# Patient Record
Sex: Female | Born: 1935 | Race: White | Hispanic: No | Smoking: Never smoker
Health system: Southern US, Community
[De-identification: ages and names within clinical notes are randomized; demographics above are authoritative.]

## PROBLEM LIST (undated history)

## (undated) DIAGNOSIS — G894 Chronic pain syndrome: Secondary | ICD-10-CM

## (undated) DIAGNOSIS — D649 Anemia, unspecified: Secondary | ICD-10-CM

## (undated) DIAGNOSIS — E079 Disorder of thyroid, unspecified: Secondary | ICD-10-CM

## (undated) DIAGNOSIS — Z8719 Personal history of other diseases of the digestive system: Secondary | ICD-10-CM

## (undated) DIAGNOSIS — H3530 Unspecified macular degeneration: Secondary | ICD-10-CM

## (undated) DIAGNOSIS — M1990 Unspecified osteoarthritis, unspecified site: Secondary | ICD-10-CM

## (undated) DIAGNOSIS — K219 Gastro-esophageal reflux disease without esophagitis: Secondary | ICD-10-CM

## (undated) DIAGNOSIS — J449 Chronic obstructive pulmonary disease, unspecified: Secondary | ICD-10-CM

## (undated) DIAGNOSIS — R634 Abnormal weight loss: Secondary | ICD-10-CM

## (undated) DIAGNOSIS — I10 Essential (primary) hypertension: Secondary | ICD-10-CM

## (undated) HISTORY — DX: Chronic obstructive pulmonary disease, unspecified: J44.9

## (undated) HISTORY — PX: JOINT REPLACEMENT: SHX530

## (undated) HISTORY — PX: CATARACT EXTRACTION: SUR2

## (undated) HISTORY — DX: Abnormal weight loss: R63.4

## (undated) HISTORY — DX: Chronic pain syndrome: G89.4

## (undated) HISTORY — DX: Unspecified osteoarthritis, unspecified site: M19.90

## (undated) HISTORY — DX: Essential (primary) hypertension: I10

## (undated) HISTORY — DX: Unspecified macular degeneration: H35.30

## (undated) HISTORY — PX: APPENDECTOMY: SHX54

## (undated) HISTORY — DX: Disorder of thyroid, unspecified: E07.9

## (undated) HISTORY — PX: TUBAL LIGATION: SHX77

## (undated) HISTORY — DX: Gastro-esophageal reflux disease without esophagitis: K21.9

## (undated) HISTORY — PX: ORIF HIP FRACTURE: SHX2125

## (undated) HISTORY — DX: Personal history of other diseases of the digestive system: Z87.19

---

## 2001-02-21 ENCOUNTER — Other Ambulatory Visit: Admission: RE | Admit: 2001-02-21 | Discharge: 2001-02-21 | Payer: Self-pay | Admitting: Obstetrics and Gynecology

## 2001-03-27 ENCOUNTER — Encounter (INDEPENDENT_AMBULATORY_CARE_PROVIDER_SITE_OTHER): Payer: Self-pay

## 2001-03-27 ENCOUNTER — Other Ambulatory Visit: Admission: RE | Admit: 2001-03-27 | Discharge: 2001-03-27 | Payer: Self-pay | Admitting: Obstetrics and Gynecology

## 2002-01-30 ENCOUNTER — Encounter: Payer: Self-pay | Admitting: Obstetrics and Gynecology

## 2002-02-05 ENCOUNTER — Inpatient Hospital Stay (HOSPITAL_COMMUNITY): Admission: RE | Admit: 2002-02-05 | Discharge: 2002-02-07 | Payer: Self-pay | Admitting: Obstetrics and Gynecology

## 2002-02-05 ENCOUNTER — Encounter (INDEPENDENT_AMBULATORY_CARE_PROVIDER_SITE_OTHER): Payer: Self-pay | Admitting: Specialist

## 2002-11-09 ENCOUNTER — Encounter: Payer: Self-pay | Admitting: Emergency Medicine

## 2002-11-09 ENCOUNTER — Emergency Department (HOSPITAL_COMMUNITY): Admission: EM | Admit: 2002-11-09 | Discharge: 2002-11-09 | Payer: Self-pay | Admitting: Emergency Medicine

## 2002-12-15 ENCOUNTER — Inpatient Hospital Stay (HOSPITAL_COMMUNITY): Admission: RE | Admit: 2002-12-15 | Discharge: 2002-12-20 | Payer: Self-pay | Admitting: Orthopedic Surgery

## 2004-08-31 ENCOUNTER — Ambulatory Visit: Payer: Self-pay | Admitting: Internal Medicine

## 2004-11-04 ENCOUNTER — Ambulatory Visit: Payer: Self-pay | Admitting: Internal Medicine

## 2005-07-04 ENCOUNTER — Inpatient Hospital Stay (HOSPITAL_COMMUNITY): Admission: AD | Admit: 2005-07-04 | Discharge: 2005-07-08 | Payer: Self-pay | Admitting: Internal Medicine

## 2005-07-04 ENCOUNTER — Ambulatory Visit: Payer: Self-pay | Admitting: Internal Medicine

## 2005-07-06 ENCOUNTER — Ambulatory Visit: Payer: Self-pay | Admitting: Internal Medicine

## 2005-07-25 ENCOUNTER — Ambulatory Visit: Payer: Self-pay | Admitting: Internal Medicine

## 2005-07-27 ENCOUNTER — Ambulatory Visit: Payer: Self-pay | Admitting: Internal Medicine

## 2005-08-02 ENCOUNTER — Ambulatory Visit (HOSPITAL_COMMUNITY): Admission: RE | Admit: 2005-08-02 | Discharge: 2005-08-02 | Payer: Self-pay | Admitting: Internal Medicine

## 2005-11-30 ENCOUNTER — Ambulatory Visit: Payer: Self-pay | Admitting: Internal Medicine

## 2006-08-10 ENCOUNTER — Ambulatory Visit (HOSPITAL_COMMUNITY): Admission: RE | Admit: 2006-08-10 | Discharge: 2006-08-10 | Payer: Self-pay | Admitting: Internal Medicine

## 2006-08-10 ENCOUNTER — Encounter (INDEPENDENT_AMBULATORY_CARE_PROVIDER_SITE_OTHER): Payer: Self-pay | Admitting: Specialist

## 2006-08-10 ENCOUNTER — Ambulatory Visit: Payer: Self-pay | Admitting: Internal Medicine

## 2006-08-10 DIAGNOSIS — K315 Obstruction of duodenum: Secondary | ICD-10-CM | POA: Insufficient documentation

## 2006-08-15 ENCOUNTER — Ambulatory Visit: Payer: Self-pay | Admitting: Internal Medicine

## 2006-09-12 ENCOUNTER — Ambulatory Visit: Payer: Self-pay | Admitting: Internal Medicine

## 2006-09-12 LAB — CONVERTED CEMR LAB
ALT: 16 units/L (ref 0–40)
BUN: 15 mg/dL (ref 6–23)
Bilirubin, Direct: 0.1 mg/dL (ref 0.0–0.3)
CO2: 29 meq/L (ref 19–32)
Calcium: 9.7 mg/dL (ref 8.4–10.5)
Chol/HDL Ratio, serum: 3.5
Cholesterol: 179 mg/dL (ref 0–200)
GFR calc non Af Amer: 57 mL/min
Glomerular Filtration Rate, Af Am: 68 mL/min/{1.73_m2}
Glucose, Bld: 126 mg/dL — ABNORMAL HIGH (ref 70–99)
HDL: 50.6 mg/dL (ref >39.0)
Hemoglobin: 11.8 g/dL — ABNORMAL LOW (ref 12.0–15.0)
LDL Cholesterol: 109 mg/dL — ABNORMAL HIGH (ref 0–99)
MCHC: 31.8 g/dL (ref 30.0–36.0)
Platelets: 316 10*3/uL (ref 150–400)
Potassium: 3.6 meq/L (ref 3.5–5.1)
RBC: 4.15 M/uL (ref 3.87–5.11)
Total Protein: 6.7 g/dL (ref 6.0–8.3)

## 2007-06-06 ENCOUNTER — Telehealth: Payer: Self-pay | Admitting: Internal Medicine

## 2007-06-19 ENCOUNTER — Ambulatory Visit: Payer: Self-pay | Admitting: Gastroenterology

## 2007-06-20 ENCOUNTER — Ambulatory Visit (HOSPITAL_COMMUNITY): Admission: RE | Admit: 2007-06-20 | Discharge: 2007-06-20 | Payer: Self-pay | Admitting: Internal Medicine

## 2007-06-20 ENCOUNTER — Encounter: Payer: Self-pay | Admitting: Internal Medicine

## 2007-06-24 ENCOUNTER — Ambulatory Visit: Payer: Self-pay | Admitting: Internal Medicine

## 2007-06-26 ENCOUNTER — Ambulatory Visit (HOSPITAL_COMMUNITY): Admission: RE | Admit: 2007-06-26 | Discharge: 2007-06-26 | Payer: Self-pay | Admitting: Internal Medicine

## 2007-07-18 ENCOUNTER — Ambulatory Visit (HOSPITAL_COMMUNITY): Admission: RE | Admit: 2007-07-18 | Discharge: 2007-07-18 | Payer: Self-pay | Admitting: Internal Medicine

## 2007-07-18 ENCOUNTER — Encounter: Payer: Self-pay | Admitting: Internal Medicine

## 2007-07-18 DIAGNOSIS — Q400 Congenital hypertrophic pyloric stenosis: Secondary | ICD-10-CM | POA: Insufficient documentation

## 2007-07-23 ENCOUNTER — Encounter: Payer: Self-pay | Admitting: Internal Medicine

## 2007-08-12 HISTORY — PX: OTHER SURGICAL HISTORY: SHX169

## 2007-08-13 ENCOUNTER — Encounter: Payer: Self-pay | Admitting: Internal Medicine

## 2007-08-16 ENCOUNTER — Encounter: Payer: Self-pay | Admitting: Internal Medicine

## 2007-08-16 ENCOUNTER — Inpatient Hospital Stay (HOSPITAL_COMMUNITY): Admission: RE | Admit: 2007-08-16 | Discharge: 2007-08-20 | Payer: Self-pay | Admitting: General Surgery

## 2007-08-20 ENCOUNTER — Encounter: Payer: Self-pay | Admitting: Internal Medicine

## 2007-09-17 ENCOUNTER — Telehealth: Payer: Self-pay | Admitting: Internal Medicine

## 2007-09-20 ENCOUNTER — Telehealth: Payer: Self-pay | Admitting: Internal Medicine

## 2007-09-24 ENCOUNTER — Ambulatory Visit (HOSPITAL_COMMUNITY): Admission: RE | Admit: 2007-09-24 | Discharge: 2007-09-24 | Payer: Self-pay | Admitting: Orthopedic Surgery

## 2007-10-23 ENCOUNTER — Telehealth: Payer: Self-pay | Admitting: Internal Medicine

## 2007-11-04 ENCOUNTER — Ambulatory Visit: Payer: Self-pay | Admitting: Internal Medicine

## 2007-11-04 DIAGNOSIS — M1990 Unspecified osteoarthritis, unspecified site: Secondary | ICD-10-CM | POA: Insufficient documentation

## 2007-11-04 DIAGNOSIS — E039 Hypothyroidism, unspecified: Secondary | ICD-10-CM

## 2007-11-04 DIAGNOSIS — R634 Abnormal weight loss: Secondary | ICD-10-CM

## 2007-11-04 DIAGNOSIS — K219 Gastro-esophageal reflux disease without esophagitis: Secondary | ICD-10-CM

## 2007-11-04 DIAGNOSIS — I10 Essential (primary) hypertension: Secondary | ICD-10-CM | POA: Insufficient documentation

## 2007-11-04 LAB — CONVERTED CEMR LAB
AST: 24 units/L (ref 0–37)
Albumin: 3.9 g/dL (ref 3.5–5.2)
Alkaline Phosphatase: 59 units/L (ref 39–117)
BUN: 25 mg/dL — ABNORMAL HIGH (ref 6–23)
Basophils Absolute: 0 10*3/uL (ref 0.0–0.1)
Calcium: 9.9 mg/dL (ref 8.4–10.5)
Chloride: 101 meq/L (ref 96–112)
Creatinine, Ser: 0.9 mg/dL (ref 0.4–1.2)
Direct LDL: 116.9 mg/dL
Eosinophils Absolute: 0.1 10*3/uL (ref 0.0–0.6)
GFR calc non Af Amer: 64 mL/min
HCT: 37.3 % (ref 36.0–46.0)
HDL: 49.6 mg/dL (ref >39.0)
MCHC: 33.1 g/dL (ref 30.0–36.0)
MCV: 93.8 fL (ref 78.0–100.0)
Monocytes Relative: 5.5 % (ref 3.0–11.0)
Platelets: 364 10*3/uL (ref 150–400)
RBC: 3.97 M/uL (ref 3.87–5.11)
RDW: 13.9 % (ref 11.5–14.6)
Sodium: 139 meq/L (ref 135–145)
Total Bilirubin: 0.8 mg/dL (ref 0.3–1.2)
Total CHOL/HDL Ratio: 4.2
Triglycerides: 167 mg/dL — ABNORMAL HIGH (ref 0–149)

## 2007-11-07 ENCOUNTER — Telehealth (INDEPENDENT_AMBULATORY_CARE_PROVIDER_SITE_OTHER): Payer: Self-pay | Admitting: Family Medicine

## 2007-11-08 ENCOUNTER — Telehealth: Payer: Self-pay | Admitting: Internal Medicine

## 2007-11-19 DIAGNOSIS — J449 Chronic obstructive pulmonary disease, unspecified: Secondary | ICD-10-CM | POA: Insufficient documentation

## 2007-11-19 DIAGNOSIS — N390 Urinary tract infection, site not specified: Secondary | ICD-10-CM

## 2007-11-19 DIAGNOSIS — M7730 Calcaneal spur, unspecified foot: Secondary | ICD-10-CM | POA: Insufficient documentation

## 2007-12-03 ENCOUNTER — Ambulatory Visit: Payer: Self-pay | Admitting: Internal Medicine

## 2008-01-27 ENCOUNTER — Telehealth: Payer: Self-pay | Admitting: Internal Medicine

## 2008-08-04 ENCOUNTER — Ambulatory Visit: Payer: Self-pay | Admitting: Internal Medicine

## 2008-11-26 ENCOUNTER — Encounter: Payer: Self-pay | Admitting: Internal Medicine

## 2008-11-27 ENCOUNTER — Ambulatory Visit: Payer: Self-pay | Admitting: Internal Medicine

## 2008-11-27 LAB — CONVERTED CEMR LAB
Alkaline Phosphatase: 83 units/L (ref 39–117)
Basophils Absolute: 0 10*3/uL (ref 0.0–0.1)
Bilirubin, Direct: 0.1 mg/dL (ref 0.0–0.3)
CO2: 30 meq/L (ref 19–32)
Calcium: 9.2 mg/dL (ref 8.4–10.5)
Creatinine, Ser: 0.7 mg/dL (ref 0.4–1.2)
Eosinophils Absolute: 0.2 10*3/uL (ref 0.0–0.7)
GFR calc non Af Amer: 84.86 mL/min (ref >60)
Glucose, Bld: 91 mg/dL (ref 70–99)
HDL: 52.2 mg/dL (ref >39.00)
Lymphocytes Relative: 25.1 % (ref 12.0–46.0)
MCHC: 33.5 g/dL (ref 30.0–36.0)
Monocytes Relative: 6.4 % (ref 3.0–12.0)
Neutrophils Relative %: 63.6 % (ref 43.0–77.0)
RBC: 4.14 M/uL (ref 3.87–5.11)
RDW: 12.7 % (ref 11.5–14.6)
Total CHOL/HDL Ratio: 3
Triglycerides: 85 mg/dL (ref 0.0–149.0)
VLDL: 17 mg/dL (ref 0.0–40.0)

## 2009-06-08 ENCOUNTER — Ambulatory Visit: Payer: Self-pay | Admitting: Internal Medicine

## 2009-09-06 ENCOUNTER — Telehealth: Payer: Self-pay | Admitting: Internal Medicine

## 2009-12-03 ENCOUNTER — Ambulatory Visit: Payer: Self-pay | Admitting: Internal Medicine

## 2010-07-21 ENCOUNTER — Ambulatory Visit: Payer: Self-pay | Admitting: Internal Medicine

## 2010-08-22 ENCOUNTER — Telehealth: Payer: Self-pay | Admitting: Internal Medicine

## 2010-10-02 ENCOUNTER — Encounter: Payer: Self-pay | Admitting: Orthopedic Surgery

## 2010-10-09 LAB — CONVERTED CEMR LAB
Alkaline Phosphatase: 68 units/L (ref 39–117)
Basophils Relative: 1 % (ref 0.0–3.0)
Bilirubin, Direct: 0 mg/dL (ref 0.0–0.3)
Calcium: 9.3 mg/dL (ref 8.4–10.5)
Creatinine, Ser: 0.9 mg/dL (ref 0.4–1.2)
Eosinophils Absolute: 0.3 10*3/uL (ref 0.0–0.7)
Eosinophils Relative: 5.9 % — ABNORMAL HIGH (ref 0.0–5.0)
GFR calc non Af Amer: 63.34 mL/min (ref >60)
HDL: 60.9 mg/dL (ref >39.00)
LDL Cholesterol: 80 mg/dL (ref 0–99)
Lymphocytes Relative: 29.4 % (ref 12.0–46.0)
MCHC: 33 g/dL (ref 30.0–36.0)
Neutrophils Relative %: 58 % (ref 43.0–77.0)
RBC: 4.08 M/uL (ref 3.87–5.11)
Total CHOL/HDL Ratio: 3
Total Protein: 6.5 g/dL (ref 6.0–8.3)
Triglycerides: 84 mg/dL (ref 0.0–149.0)
VLDL: 16.8 mg/dL (ref 0.0–40.0)
WBC: 5.6 10*3/uL (ref 4.5–10.5)

## 2010-10-11 NOTE — Assessment & Plan Note (Signed)
Summary: MEDICATION REVIEW // RS   Vital Signs:  Patient profile:   77 year old female Weight:      100 pounds Temp:     98.1 degrees F oral BP sitting:   120 / 80  (left arm) Cuff size:   regular  Vitals Entered By: Duard Brady LPN (July 21, 2010 10:29 AM) CC: medication review - synthriod adjustment Is Patient Diabetic? No Flu Vaccine Consent Questions     Do you have a history of severe allergic reactions to this vaccine? no    Any prior history of allergic reactions to egg and/or gelatin? no    Do you have a sensitivity to the preservative Thimersol? no    Do you have a past history of Guillan-Barre Syndrome? no    Do you currently have an acute febrile illness? no    Have you ever had a severe reaction to latex? no    Vaccine information given and explained to patient? yes    Are you currently pregnant? no    Lot Number:[id number]   Exp Date:03/11/2011   Site Given  Left Deltoid IM   CC:  medication review - synthriod adjustment.  History of Present Illness: 77 year old patient who is seen today for follow-up area and she has a history of gastroesophageal reflux disease, but she has still continued Nexium, and has done quite well.  She has hypothyroidism and her Synthroid dose was adjusted 6 months ago due to a suppressed TSH.  Since that time.  She has gained 6 pounds in weight.  She feels well today.  She has a history of treated hypertension and osteoarthritis.  She has a history of COPD.  Her chief complaint today is foot pain.  She is followed by a podiatrist  Allergies (verified): No Known Drug Allergies  Past History:  Past Medical History: Reviewed history from 06/08/2009 and no changes required. GERD Hypertension Hypothyroidism Osteoarthritis weight loss COPD history of pyloric stenosis chronic pain syndrome macular degeneration.    Family History: Reviewed history from 11/04/2007 and no changes required. details of her father's health  unknown mother died at childbirth  No siblings  Review of Systems       The patient complains of muscle weakness and difficulty walking.  The patient denies anorexia, fever, weight loss, weight gain, vision loss, decreased hearing, hoarseness, chest pain, syncope, dyspnea on exertion, peripheral edema, prolonged cough, headaches, hemoptysis, abdominal pain, melena, hematochezia, severe indigestion/heartburn, hematuria, incontinence, genital sores, suspicious skin lesions, transient blindness, depression, unusual weight change, abnormal bleeding, enlarged lymph nodes, angioedema, and breast masses.    Physical Exam  General:  underweight appearing.  normal blood pressure Head:  Normocephalic and atraumatic without obvious abnormalities. No apparent alopecia or balding. Eyes:  No corneal or conjunctival inflammation noted. EOMI. Perrla. Funduscopic exam benign, without hemorrhages, exudates or papilledema. Vision grossly normal. Mouth:  Oral mucosa and oropharynx without lesions or exudates.   Neck:  No deformities, masses, or tenderness noted. Lungs:  Normal respiratory effort, chest expands symmetrically. Lungs are clear to auscultation, no crackles or wheezes. Heart:  Normal rate and regular rhythm. S1 and S2 normal without gallop, murmur, click, rub or other extra sounds. Abdomen:  Bowel sounds positive,abdomen soft and non-tender without masses, organomegaly or hernias noted. Msk:  No deformity or scoliosis noted of thoracic or lumbar spine.   Extremities:  No clubbing, cyanosis, edema, or deformity noted with normal full range of motion of all joints.   Skin:  Intact without  suspicious lesions or rashes Cervical Nodes:  No lymphadenopathy noted   Impression & Recommendations:  Problem # 1:  COPD (ICD-496) clinically stable, off medication  Problem # 2:  WEIGHT LOSS (ICD-783.21) improved since Synthroid, dose adjusted  Problem # 3:  HYPOTHYROIDISM (ICD-244.9)  Her updated  medication list for this problem includes:    Synthroid 50 Mcg Tabs (Levothyroxine sodium) ..... Qd  Orders: Venipuncture (16109) TLB-TSH (Thyroid Stimulating Hormone) (84443-TSH) Specimen Handling (60454)  Problem # 4:  GERD (ICD-530.81)  Her updated medication list for this problem includes:    Nexium 40 Mg Cpdr (Esomeprazole magnesium) .Marland Kitchen... 1 once daily  Complete Medication List: 1)  Synthroid 50 Mcg Tabs (Levothyroxine sodium) .... Qd 2)  Lisinopril 20 Mg Tabs (Lisinopril) .Marland Kitchen.. 1 once daily 3)  Nexium 40 Mg Cpdr (Esomeprazole magnesium) .Marland Kitchen.. 1 once daily 4)  Mirtazapine 15 Mg Tabs (Mirtazapine) .... One at bedtime 5)  Robaxin 500 Mg Tabs (Methocarbamol) .... One tab every 6 hours as needed for pain 6)  Hydrocodone-acetaminophen 5-500 Mg Tabs (Hydrocodone-acetaminophen) .... One every 6 hours p.r.n. pain 7)  Fish Oil 300 Mg Caps (Omega-3 fatty acids) .... One daily 8)  Lutein 20 Mg Caps (Lutein) .... One daily  Other Orders: Flu Vaccine 20yrs + MEDICARE PATIENTS (U9811) Administration Flu vaccine - MCR (B1478)  Patient Instructions: 1)  Please schedule a follow-up appointment in 6 months. 2)  Limit your Sodium (Salt). 3)  Avoid foods high in acid (tomatoes, citrus juices, spicy foods). Avoid eating within two hours of lying down or before exercising. Do not over eat; try smaller more frequent meals. Elevate head of bed twelve inches when sleeping. Prescriptions: HYDROCODONE-ACETAMINOPHEN 5-500 MG TABS (HYDROCODONE-ACETAMINOPHEN) one every 6 hours p.r.n. pain  #90 x 5   Entered and Authorized by:   Gordy Savers  MD   Signed by:   Gordy Savers  MD on 07/21/2010   Method used:   Print then Give to Patient   RxID:   [id number] LISINOPRIL 20 MG  TABS (LISINOPRIL) 1 once daily  #90 x 4   Entered and Authorized by:   Gordy Savers  MD   Signed by:   Gordy Savers  MD on 07/21/2010   Method used:   Print then Give to Patient   RxID:    [id number] SYNTHROID 50 MCG TABS (LEVOTHYROXINE SODIUM) qd  #90 x 2   Entered and Authorized by:   Gordy Savers  MD   Signed by:   Gordy Savers  MD on 07/21/2010   Method used:   Print then Give to Patient   RxID:   [id number]    Orders Added: 1)  Flu Vaccine 69yrs + MEDICARE PATIENTS [Q2039] 2)  Administration Flu vaccine - MCR [G0008] 3)  Est. Patient Level IV [56387] 4)  Venipuncture [56433] 5)  TLB-TSH (Thyroid Stimulating Hormone) [84443-TSH] 6)  Specimen Handling [99000]

## 2010-10-11 NOTE — Assessment & Plan Note (Signed)
Summary: pt will come in fasting/njr   Vital Signs:  Patient profile:   77 year old Flynn Height:      58.5 inches Weight:      94 pounds Temp:     98.5 degrees F oral BP sitting:   130 / 80  (left arm) Cuff size:   regular  Vitals Entered By: Duard Brady LPN (December 03, 2009 9:36 AM) CC: cpx - doing well , fasting for labs, Hypertension Management Is Patient Diabetic? No   CC:  cpx - doing well , fasting for labs, and Hypertension Management.  History of Present Illness: Here for Medicare AWV:  an 77 year old patient who is seen today for a comprehensive evaluation  1.   Risk factors based on Past M, S, F history:  medical problems include hypothyroidism untreated hypertension.  She has a history of a gastroesophageal disease as well as pyloric stenosis.  She has mild COPD, which has been stable.  She denies any cardiopulmonary complaints. 2.   Physical Activities: lives independently, stays active, but no exercise program 3.   Depression/mood: history depression 4.   Hearing: no deficits able to hear the spoken word at 15 feet 5.   ADL's: lives alone independently 6.   Fall Risk: mild fall risk due to age and arthritis 7.   Home Safety: single level home, only steps are  in the back, and she does have safety rails 8.   Height, weight, &visual acuity: was reading glasses.  His vision is intact.  Is followed by ophthalmology for very early macular degeneration 9.   Counseling: safety concerns discussed, as well as calcium and vitamin D supplementation 10.   Labs ordered based on risk factors: CBC chemistries, lipid profile, Bowersox and studies will be obtained 11.           Referral Coordination  follow-up ophthalmology as scheduled 12.           Care Plan- restricted salt diet, calcium supplementation vitamin D supplementation, more regular exercise regimen discussed   Hypertension History:      She denies headache, chest pain, palpitations, dyspnea with exertion,  orthopnea, PND, peripheral edema, visual symptoms, neurologic problems, syncope, and side effects from treatment.        Positive major cardiovascular risk factors include Flynn age 25 years old or older and hypertension.  Negative major cardiovascular risk factors include non-tobacco-user status.     Preventive Screening-Counseling & Management  Alcohol-Tobacco     Alcohol drinks/day: 0     Smoking Status: never  Caffeine-Diet-Exercise     Caffeine Counseling: not indicated; caffeine use is not excessive or problematic     Nutrition Referrals: no     Does Patient Exercise: no     Exercise Counseling: to improve exercise regimen     Depression Counseling: not indicated; screening negative for depression  Allergies (verified): No Known Drug Allergies  Past History:  Past Medical History: Reviewed history from 06/08/2009 and no changes required. GERD Hypertension Hypothyroidism Osteoarthritis weight loss COPD history of pyloric stenosis chronic pain syndrome macular degeneration.    Past Surgical History: Reviewed history from 11/04/2007 and no changes required. Appendectomy Cataract extraction Hip fracture ORIF Hysterectomy Tubal ligation [employer]  fundoplication December 2008 gravida 12, para 9 abortus 3  Family History: Reviewed history from 11/04/2007 and no changes required. details of her father's health unknown mother died at childbirth  No siblings  Social History: Reviewed history from 11/27/2008 and no changes required.  Widow/Widower Never Smoked lives independentlySmoking Status:  never Does Patient Exercise:  no  Review of Systems  The patient denies anorexia, fever, weight loss, weight gain, vision loss, decreased hearing, hoarseness, chest pain, syncope, dyspnea on exertion, peripheral edema, prolonged cough, headaches, hemoptysis, abdominal pain, melena, hematochezia, severe indigestion/heartburn, hematuria, incontinence, genital sores, muscle  weakness, suspicious skin lesions, transient blindness, difficulty walking, depression, unusual weight change, abnormal bleeding, enlarged lymph nodes, angioedema, and breast masses.    Physical Exam  General:  underweight appearing.  normal blood pressure Head:  Normocephalic and atraumatic without obvious abnormalities. No apparent alopecia or balding. Eyes:  No corneal or conjunctival inflammation noted. EOMI. Perrla. Funduscopic exam benign, without hemorrhages, exudates or papilledema. Vision grossly normal. Ears:  External ear exam shows no significant lesions or deformities.  Otoscopic examination reveals clear canals, tympanic membranes are intact bilaterally without bulging, retraction, inflammation or discharge. Hearing is grossly normal bilaterally. Nose:  External nasal examination shows no deformity or inflammation. Nasal mucosa are pink and moist without lesions or exudates. Mouth:  Oral mucosa and oropharynx without lesions or exudates.  dentures in place Neck:  No deformities, masses, or tenderness noted. Chest Wall:  No deformities, masses, or tenderness noted. Breasts:  No mass, nodules, thickening, tenderness, bulging, retraction, inflamation, nipple discharge or skin changes noted.   Lungs:  Normal respiratory effort, chest expands symmetrically. Lungs are clear to auscultation, no crackles or wheezes. Heart:  Normal rate and regular rhythm. S1 and S2 normal without gallop, murmur, click, rub or other extra sounds. Abdomen:  Bowel sounds positive,abdomen soft and non-tender without masses, organomegaly or hernias noted. surgical scars noted in the lower abdominal area faint left femoral bruit Rectal:  No external abnormalities noted. Normal sphincter tone. No rectal masses or tenderness. Genitalia:  atrophic changes only Msk:  No deformity or scoliosis noted of thoracic or lumbar spine.   Pulses:  posterior tibial pulse is not easily palpable; dorsalis pedis pulses were  full Extremities:  No clubbing, cyanosis, edema, or deformity noted with normal full range of motion of all joints.   Neurologic:  alert & oriented X3, cranial nerves II-XII intact, strength normal in all extremities, sensation intact to pinprick, gait normal, and heel-to-shin normal.   Skin:  Intact without suspicious lesions or rashes Cervical Nodes:  No lymphadenopathy noted Axillary Nodes:  No palpable lymphadenopathy Inguinal Nodes:  No significant adenopathy Psych:  Cognition and judgment appear intact. Alert and cooperative with normal attention span and concentration. No apparent delusions, illusions, hallucinations   Impression & Recommendations:  Problem # 1:  PREVENTIVE HEALTH CARE (ICD-V70.0)  Orders: First annual wellness visit with prevention plan  (C1660)  Problem # 2:  COPD (ICD-496)  Problem # 3:  OSTEOARTHRITIS (ICD-715.90)  The following medications were removed from the medication list:    Tramadol Hcl 50 Mg Tabs (Tramadol hcl) .Marland Kitchen... 1 q6h as needed pain Her updated medication list for this problem includes:    Hydrocodone-acetaminophen 5-500 Mg Tabs (Hydrocodone-acetaminophen) ..... One every 6 hours p.r.n. pain  Orders: TLB-BMP (Basic Metabolic Panel-BMET) (80048-METABOL) TLB-CBC Platelet - w/Differential (85025-CBCD) TLB-Hepatic/Liver Function Pnl (80076-HEPATIC)  Problem # 4:  HYPOTHYROIDISM (ICD-244.9)  Her updated medication list for this problem includes:    Synthroid 88 Mcg Tabs (Levothyroxine sodium) .Marland Kitchen... 1 once daily  Orders: Venipuncture (63016) TLB-Lipid Panel (80061-LIPID) TLB-BMP (Basic Metabolic Panel-BMET) (80048-METABOL) TLB-CBC Platelet - w/Differential (85025-CBCD) TLB-Hepatic/Liver Function Pnl (80076-HEPATIC) TLB-TSH (Thyroid Stimulating Hormone) (84443-TSH)  Problem # 5:  HYPERTENSION (ICD-401.9)  Her updated  medication list for this problem includes:    Lisinopril 20 Mg Tabs (Lisinopril) .Marland Kitchen... 1 once daily  Orders: EKG w/  Interpretation (93000) TLB-BMP (Basic Metabolic Panel-BMET) (80048-METABOL) TLB-CBC Platelet - w/Differential (85025-CBCD) TLB-Hepatic/Liver Function Pnl (80076-HEPATIC)  Complete Medication List: 1)  Synthroid 88 Mcg Tabs (Levothyroxine sodium) .Marland Kitchen.. 1 once daily 2)  Lisinopril 20 Mg Tabs (Lisinopril) .Marland Kitchen.. 1 once daily 3)  Nexium 40 Mg Cpdr (Esomeprazole magnesium) .Marland Kitchen.. 1 once daily 4)  Mirtazapine 15 Mg Tabs (Mirtazapine) .... One at bedtime 5)  Robaxin 500 Mg Tabs (Methocarbamol) .... One tab every 6 hours as needed for pain 6)  Hydrocodone-acetaminophen 5-500 Mg Tabs (Hydrocodone-acetaminophen) .... One every 6 hours p.r.n. pain 7)  Fish Oil 300 Mg Caps (Omega-3 fatty acids) .... One daily 8)  Lutein 20 Mg Caps (Lutein) .... One daily  Hypertension Assessment/Plan:      The patient's hypertensive risk group is category B: At least one risk factor (excluding diabetes) with no target organ damage.  Her calculated 10 year risk of coronary heart disease is 9 %.  Today's blood pressure is 130/80.    Patient Instructions: 1)  Please schedule a follow-up appointment in 6 months. 2)  Limit your Sodium (Salt). 3)  It is important that you exercise regularly at least 20 minutes 5 times a week. If you develop chest pain, have severe difficulty breathing, or feel very tired , stop exercising immediately and seek medical attention. 4)  Take calcium +Vitamin D daily. Prescriptions: HYDROCODONE-ACETAMINOPHEN 5-500 MG TABS (HYDROCODONE-ACETAMINOPHEN) one every 6 hours p.r.n. pain  #90 x 5   Entered and Authorized by:   Gordy Savers  MD   Signed by:   Gordy Savers  MD on 12/03/2009   Method used:   Print then Give to Patient   RxID:   [id number] ROBAXIN 500 MG  TABS (METHOCARBAMOL) one tab every 6 hours as needed for pain  #100 x 4   Entered and Authorized by:   Gordy Savers  MD   Signed by:   Gordy Savers  MD on 12/03/2009   Method used:   Print then Give to  Patient   RxID:   [id number] MIRTAZAPINE 15 MG  TABS (MIRTAZAPINE) one at bedtime  #90 x 4   Entered and Authorized by:   Gordy Savers  MD   Signed by:   Gordy Savers  MD on 12/03/2009   Method used:   Print then Give to Patient   RxID:   [id number] NEXIUM 40 MG  CPDR (ESOMEPRAZOLE MAGNESIUM) 1 once daily  #90 x 6   Entered and Authorized by:   Gordy Savers  MD   Signed by:   Gordy Savers  MD on 12/03/2009   Method used:   Print then Give to Patient   RxID:   [id number] LISINOPRIL 20 MG  TABS (LISINOPRIL) 1 once daily  #90 x 4   Entered and Authorized by:   Gordy Savers  MD   Signed by:   Gordy Savers  MD on 12/03/2009   Method used:   Print then Give to Patient   RxID:   [id number] SYNTHROID 88 MCG  TABS (LEVOTHYROXINE SODIUM) 1 once daily  #90 x 4   Entered and Authorized by:   Gordy Savers  MD   Signed by:   Gordy Savers  MD on 12/03/2009   Method used:   Print then Give to Patient  RxID:   [id number]

## 2010-10-13 NOTE — Progress Notes (Signed)
Summary: refill vicodin  Phone Note Call from Patient   Caller: Patient Call For: Katherine Savers  MD Reason for Call: Refill Medication Summary of Call: vicodin rx was shreded by mistake per pt - needs refill on vicodin to [facility].  Initial call taken by: Duard Brady LPN,  August 22, 2010 12:36 PM  Follow-up for Phone Call        #90 RF 1- notify that no further early refills Follow-up by: Katherine Savers  MD,  August 22, 2010 12:51 PM    Prescriptions: HYDROCODONE-ACETAMINOPHEN 5-500 MG TABS (HYDROCODONE-ACETAMINOPHEN) one every 6 hours p.r.n. pain  #90 x 0   Entered by:   Duard Brady LPN   Authorized by:   Katherine Savers  MD   Signed by:   Duard Brady LPN on 83/15/1761   Method used:   Historical   RxID:   [id number]  [facility] called to [facility]. KIK

## 2011-01-24 NOTE — Discharge Summary (Signed)
NAMEMALYAH, Katherine Flynn           ACCOUNT NO.:  [id number]   MEDICAL RECORD NO.:  [id number]          PATIENT TYPE:  INP   LOCATION:  1610                         FACILITY:  [facility]   PHYSICIAN:  Lennie Muckle, MD      DATE OF BIRTH:  18-Dec-1922   DATE OF ADMISSION:  08/16/2007  DATE OF DISCHARGE:  08/20/2007                               DISCHARGE SUMMARY   Discharge diagnosis:  Gastric outlet obstruction status post gastrojejunostomy .   PROCEDURE:  August 16, 2007:  Gastrojejunostomy.   HOSPITAL COURSE:  [name] is an 77 year old female who underwent  gastrojejunostomy for gastric outlet obstruction on August 16, 2007.  Postoperatively, she did have an NG tube which was placed to straight  drain after 1 day.  Did not have any nausea or vomiting.  NG tube was  able to be discontinued successfully.  She was started on clear liquid  diet on postoperative day 3 which advanced as tolerated.  She has had no  difficulty with intake, has had no difficulty swallowing.  Pain has been  controlled, originally with PCA which has been switched to oxycodone  elixir.  She has been on Protonix during her hospital course and  instructed to continue her Nexium daily to protect against an  anastomotic ulcer as well as any continued gastritis or ulcer disease.  She is given oxycodone elixir.   FOR DISCHARGE INSTRUCTIONS:  Take over-the-counter stool softeners to  aid with constipation and will come and see me after the holidays.  Is  to perform activity as tolerated with restriction of no heavy lifting  greater than 20 pounds for 6 weeks.  She will receive the pneumococcal  influenza vaccine while she is in the hospital and will receive  instructions to call the office if she develops any problems once her  discharge.  I briefly discussed with her she may want to sleep with her  head of the bed slightly elevated to protect against some reflux.  However, she has had no problems since her  surgery and she will continue  to monitor this at home.      Lennie Muckle, MD  Electronically Signed     ALA/MEDQ  D:  08/20/2007  T:  08/20/2007  Job:  [id number]

## 2011-01-24 NOTE — Op Note (Signed)
NAMEMALISSA, Katherine Flynn           ACCOUNT NO.:  [id number]   MEDICAL RECORD NO.:  [id number]          PATIENT TYPE:  INP   LOCATION:  0003                         FACILITY:  [facility]   PHYSICIAN:  Lennie Muckle, MD      DATE OF BIRTH:  April 04, 1923   DATE OF PROCEDURE:  DATE OF DISCHARGE:                               OPERATIVE REPORT   PREOPERATIVE DIAGNOSIS:  Gastric outlet obstruction.   POSTOPERATIVE DIAGNOSIS:  Gastric outlet obstruction.   PROCEDURE:  Gastrojejunostomy.   SURGEON:  Lennie Muckle, MD   ESTIMATED BLOOD LOSS:  Less than 50 ml.   COMPLICATIONS:  No immediate complications.   DRAINS:  No drains were placed.  Nasogastric tube was placed with  guidance by me by anesthesia.   SPECIMENS:  None.   INDICATIONS FOR PROCEDURE:  Katherine Flynn is an 77 year old female who  had gastric outlet obstruction in the past with previous dilation by Dr.  Stan Head.  She has had 3 dilations within the past month.  It was  believed she would fair better having a surgery diversion due to  continued obstruction and difficulty with most recent dilation.  Informed consent was obtained prior to the procedure.   DETAILS OF PROCEDURE:  Katherine Flynn was identified in the preoperative  holding suite.  Sh was given IV cefoxitin prior to being taken tot eh  operating room.  She was then taken to the operating room and placed in  a supine position.  After administration of general endotracheal  anesthesia a Foley catheter was placed.  Her abdomen was prepped and  draped in the usual sterile fashion.  A time out procedure indicating  the patient and procedure was performed. Using a #10 blade and an  incision was placed in the epigastric region midline.  Subcutaneous  tissue was divided with electrocautery.  The peritoneum was pulled away  from the abdominal contents.  After gaining entry into the abdominal  cavity with Metzenbaum scissors safely, the incision was extended with  the  electrocautery.  The stomach was visualized without difficulty.  At  the pylorus area there was hard and thickened area which was the area of  concern.  The small intestine was inspected and ligament of Treitz  identified and a segment easily brought up to the stomach.  An incision  was placed into the mesocolon just to the left of the middle colonic  vessels.  The small bowel was approximately cleared away from the  ligament of Treitz and area was identified on the dependent portion of  the stomach.  Stay sutures using 2-0 Silk were placed for the posterior  row.  A gastrotomy was placed with electrocautery.  After performing the  posterior row of silk sutures in interrupted fashion, an enterotomy was  then created in the small intestine.  A running suture was then placed  with 3-0 Vicryl; suture.  The anterior and posterior rows were closed  with the running Vicryl.  An anterior layer of silk interrupted sutures  were used for the 2nd layer.  The stomach was then brought through the  mesentery of  the colon and secured in place with 3-0 Silk sutures.  The  anastomosis was patent upon inspection.  A nasogastric tube was then  guided by me just superior to the anastomosis.  This was then secured in  place by anesthesia.  The abdomen was irrigated with 1 liter of saline.  There was no  bleeding at the end of the case.  The fascia was closed with a running 1-  0 PDS suture.  The skin was closed with 3-0 Vicryl and then 4-0  Monocryl.  Dermabond was placed for a final dressing.  The patient was  then extubated and transferred to postanesthesia care unit in stable  condition.      Lennie Muckle, MD  Electronically Signed     ALA/MEDQ  D:  08/16/2007  T:  08/16/2007  Job:  [id number]

## 2011-01-24 NOTE — Assessment & Plan Note (Signed)
[facility]                         GASTROENTEROLOGY OFFICE NOTE   CAMERAN, AHMED                  MRN:          [id number]  DATE:06/19/2007                            DOB:          1923-02-27    CHIEF COMPLAINT:  Nausea, heartburn, and intermittent vomiting.   HISTORY:  This is a pleasant 77 year old white female known to Dr.  Leone Payor who has a history of gastric outlet obstruction secondary to  benign pyloric stenosis.  She has had problems intermittently over the  past few years, and was last endoscoped in November of 2007.  At that  time, she had esophagitis and antral ulcer, and retained food in her  stomach.  She had a tight stricture in the duodenal bulb and prepyloric  area, which was ballooned to 12 mm.  She has been maintained on chronic  omeprazole at 20 mg per day, and says that she did well after that last  dilation until the past few weeks.  At this point, she is having  progressive difficulty over the past few weeks to the point that she is  not keeping much of anything down.  On more detailed questioning today,  she says she has eaten a scrambled egg, but had not had anything else to  eat or drink, and yesterday had one Ensure.  She says she feels  nauseated, has a lot of ongoing gas, belching, and burping, as well as  heartburn, and says that often times she only vomits every other day,  but at that point will vomit undigested food that she had eaten over the  past couple of days.  Her main complaint today is fairly severe  heartburn.  She has been taking Pepto-Bismol p.r.n. on top of the  omeprazole b.i.d.  She denies any regular aspirin or NSAID use.  Has  been using Tylenol on a p.r.n. basis.   The patient is unaware of any significant weight loss over the past  couple weeks.  She weighs 97 pounds and weighed approximately 96 pounds  1 year ago.   CURRENT MEDICATIONS:  1. Synthroid 88 mcg daily.  2.  Hydrochlorothiazide 25 daily.  3. Omeprazole 20 p.o. daily.  4. Lisinopril 20 p.o. daily.   ALLERGIES:  No known drug allergies.   PHYSICAL EXAM:  Well-developed, small elderly white female in no acute  distress.  Blood pressure 110/60, pulse 98, weight 97.  HEENT:  Normocephalic, atraumatic.  EOMI.  PERRLA.  Sclerae anicteric.  NECK:  Supple.  CARDIOVASCULAR:  Regular rate and rhythm with S1, S2.  She is slightly  tachy.  PULMONARY:  Clear to A and P.  ABDOMEN:  Soft.  There is no succussion splash.  She is tender in the  epigastrium.  There is no palpable mass or hepatosplenomegaly.  RECTAL:  Not done.   IMPRESSION:  An 77 year old white female with a history of gastric  outlet obstruction secondary to benign stricture now with recurrent  obstructive symptoms.   PLAN:  1. Schedule upper endoscopy with balloon dilation with Dr. Leone Payor as      soon as possible.  2. Institute liquid diet  with Ensure supplements.  3. Stop generic Prilosec and switch to Nexium 40 mg b.i.d. in the      short-term.  4. Re-emphasized avoidance of all aspirin and antiinflammatories.      Mike Gip, PA-C  Electronically Signed      Venita Lick. Russella Dar, MD, [facility]  Electronically Signed   AE/MedQ  DD: 07/16/2007  DT: 07/17/2007  Job #: [id number]

## 2011-01-24 NOTE — Assessment & Plan Note (Signed)
[facility]                         GASTROENTEROLOGY OFFICE NOTE   Katherine Flynn, Katherine Flynn                  MRN:          [id number]  DATE:06/19/2007                            DOB:          1923/02/14    CHIEF COMPLAINT:  Nausea, heart burn, and intermittent vomiting.   HISTORY:  This is a pleasant 77 year old white female known to Dr.  Leone Payor, who has history of gastric outlet obstruction secondary to  benign pyloric stenosis.  She has had problems intermittently over the  past few years, was last endoscoped in November 2007, at that time had  esophagitis and antral ulcer, retained food in the stomach, and a tight  stricture in the duodenal bulb and prepyloric area.  This was balloon  dilated to 12 mm.  The patient has been maintained on chronic omeprazole  at 20 mg per day and says that she had done well after that dilation  until the patient few weeks.  She says she is having progressive trouble  now over the past couple of weeks, and it has gotten to the point where  she is not keeping much of anything down.  On more detailed questioning,  today she has eaten a scrambled egg but has not had anything else to eat  or drink and yesterday had a couple of Ensures.  She says she feel  nauseated, has a lot of gas and burping, heartburn, and says that often  times she only vomits about every other day but then will vomit  undigested foods that she had eaten a couple of days previous.  Her main  complaint today is bad heartburn.  She has been taking some Pepto-Bismol  on top of her omeprazole.  She denies any chronic aspirin or NSAID use,  has been using Tylenol on a p.r.n. basis.   She is unaware of any significant weight loss over the past 2 weeks and  weighs 97 pounds today.  She weighed 96 pounds last November.   CURRENT MEDICATIONS:  1. Synthroid 88 mcg daily.  2. HCTZ 25 daily.  3. Omeprazole 20 mg daily.  4. Lisinopril 20 mg daily.   Incomplete.      Mike Gip, PA-C  Electronically Signed      Venita Lick. Russella Dar, MD, [facility]  Electronically Signed   AE/MedQ  DD: 06/19/2007  DT: 06/20/2007  Job #: [id number]

## 2011-01-27 NOTE — Consult Note (Signed)
Katherine Flynn, Katherine Flynn           ACCOUNT NO.:  [id number]   MEDICAL RECORD NO.:  [id number]          PATIENT TYPE:  INP   LOCATION:  5154                         FACILITY:  [facility]   PHYSICIAN:  Iva Boop, M.D. LHCDATE OF BIRTH:  July 23, 1923   DATE OF CONSULTATION:  07/06/2005  DATE OF DISCHARGE:                                   CONSULTATION   GASTROENTEROLOGY CONSULTATION   REFERRING PHYSICIAN:  Rene Paci, M.D.   PRIMARY CARE PHYSICIAN:  Gordy Savers, M.D.   REASON FOR CONSULTATION:  Thickened esophagus and stomach on CT scan with  epigastric pain, nausea and vomiting, dysphagia.   ASSESSMENT:  She has markedly thickened esophageal wall and an antral wall  on CT scan.  She was admitted with nausea and vomiting, dark material ?  hematemesis.  She has Hemoccult positive stool on rectal examination today.  She has lost 9 pounds of weight and she describes difficulty swallowing  solid foods and postprandial vomiting as well as what sounds like some  impact dysphagia.   RECOMMENDATIONS AND PLAN:  Upper gastrointestinal endoscopy with possible  esophageal dilation tomorrow.  Further plans pending that.  Etiologies do  include inflammatory and neoplastic conditions.  It is not really clear from  the CT scan at this point.  I suspect this is all inflammatory but in her  age group we must exclude a malignancy.   HISTORY OF PRESENT ILLNESS:  Pleasant 77 year old white woman who has had  heartburn off and on for several months, if not a year.  Initially treated  with Tums and then that stopped working.  Over the past several weeks it has  worsened and then she had intense epigastric burning, belching, postprandial  as well as regurgitation or vomiting of food at times.  She describes  dysphagia to solid foods.  She says there is a suprasternal sticking point  at times.  She has had two to three days of nausea and vomiting and vomited  some dark material at some  point.  She seemed to improve a little bit with  hospitalization and then felt worse after her lunch time meal today.  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  She says she has lost about 9 pounds of weight recently.  No melena  reported.  She moves her bowels regularly without problems.   PAST MEDICAL HISTORY:  Hypothyroidism.  Gravida 12, para 9, 1 abortion,  chronic obstructive pulmonary disease.   PAST SURGICAL HISTORY:  Total hip replacement on right x2.  Remote  appendectomy.  Remote tubal ligation.  Cataracts.  Bladder suspension.  Hysterectomy, bilateral salpingo-oophorectomy, endometrial polyp and cystic  hyperplasia removal.  Heel spurs.   MEDICATIONS:  1.  Cipro intravenously and then p.o. for urinary tract infection which she      has here.  2.  Synthroid at 88 mcg daily.  3.  Hydrochlorothiazide 25 mg  daily.  4.  Protonix  40 mg intravenously daily.  5.  Lisinopril 20 mg  p.o. daily.   ALLERGIES:  No known drug allergies.   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  She is widowed.  No alcohol or  tobacco.   FAMILY HISTORY:  No colon or gastric cancer.   REVIEW OF SYSTEMS:  The patient may have had a flexible sigmoidoscopy in the  past.  She has not had a colonoscopy.  She has had urinary frequency and  urgency.  She has occasional arthralgias.  She has dyspnea on exertion.  She  wears eye glasses.  She has upper and lower dentures which she has left at  home.  She has insomnia.  All other review of systems negative.   PHYSICAL EXAMINATION:  GENERAL APPEARANCE:  Examination reveals a pleasant,  diminutive, somewhat frail-appearing elderly white woman.  VITAL SIGNS:  Temperature 98.8, pulse 66, blood  pressure 136/70,  respirations 20.  HEENT:  Eyes are anicteric. ENT edentulous, moist, clear of lesions in the  mouth and posterior pharynx.  NECK:  Supple.  No thyromegaly or mass detected.  LUNGS:  Diffusely decreased breath sounds with a few crackles at the bases  that improve with respiration.  HEART:  S1, S2, I hear no murmurs, rubs or gallops.  ABDOMEN:  Soft.  There are surgical scars from previous surgeries.  There is  no obvious hernia though there are some bulging or deformity related to  these scars but I don't think it is a true hernia.  She has no  hepatosplenomegaly or mass.  RECTAL:  Shows Hemoccult positive brown stool.  EXTREMITIES:  No edema.  NEUROLOGICAL:  She is alert and oriented X3.  LYMPH NODES:  No neck or supraclavicular nodes palpated.   LABORATORY DATA:  Hemoglobin 11.8, white blood cell count 11.2.  Amylase and  lipase are normal.  Coag's are normal.  BMET normal except for potassium  3.2.  Urinalysis shows 7 to 10 white blood cells, multiple bacteria.  X-rays  are as above.   I appreciate the opportunity to care for this patient.      Iva Boop, M.D. [facility]  Electronically Signed     CEG/MEDQ  D:  07/06/2005  T:  07/07/2005  Job:  [id number]

## 2011-01-27 NOTE — Discharge Summary (Signed)
NAME:  Katherine Flynn, Katherine Flynn                     ACCOUNT NO.:  [id number]   MEDICAL RECORD NO.:  [id number]                   PATIENT TYPE:  INP   LOCATION:  5004                                 FACILITY:  [facility]   PHYSICIAN:  Feliberto Gottron. Turner Daniels, M.D.                DATE OF BIRTH:  [birth year]   DATE OF ADMISSION:  12/15/2002  DATE OF DISCHARGE:  12/20/2002                                 DISCHARGE SUMMARY   PRIMARY DIAGNOSIS:  Painful loose right total hip arthroplasty.   PROCEDURES WHILE IN HOSPITAL:  Right total hip revision.   HISTORY OF PRESENT ILLNESS:  The patient is a 77 year old woman who has a  total hip arthroplasty implanted on her right side by one of Dr. Wadie Lessen  partners 11 years ago.  The patient has had loosening of the acetabular  component and it is now grossly loose.  The component has gone almost purely  vertical.  It has subluxed out and is resting in the dome of the acetabulum.  The patient is to undergo revision arthroplasty to decrease pain and  increase function of the femoral component, which appears to be an HA coated  Omniflex stem.  It does have __________ and appears to be well fixed.  It  will be revised only if loose.  The patient wishes to proceed with this  after discussion the risks versus benefits.   ALLERGIES:  No known drug allergies.   MEDICATIONS AT THE TIME OF ADMISSION:  1. Synthroid.  2. Potassium.  3. Hydrochlorothiazide.  4. Accupril.  5. Vitamins.  6. Bextra.   PAST MEDICAL HISTORY:  1. Hypertension.  2. DJD.  3. Hypothyroidism.   PAST SURGICAL HISTORY:  1. Cataracts in 1988.  2. Right total hip replacement in 1994.  3. Hysterectomy and bladder tack in May of 2003 with which the patient had a     severe reaction to anesthesia.   SOCIAL HISTORY:  No tobacco, no ethanol, and no IV drug abuse.  She is  widowed and lives alone.   FAMILY HISTORY:  Her mother died in her 99s secondary to childbirth.  Her  father's history is  unknown.   REVIEW OF SYSTEMS:  Positive glasses.  Decreased hearing.  Shortness of  breath with exertion.  Continent of bladder.   PHYSICAL EXAMINATION:  VITAL SIGNS:  Blood pressure 120/76.  HEIGHT:  She is 4 feet 11 inches.  WEIGHT:  145 pounds.  HEENT:  Head:  Normocephalic.  Ears:  TMs are clear.  Eyes:  Pupils equal,  round, and reactive to light and accomodation.  Nose:  Patent.  Throat:  Benign.  NECK:  Supple.  Full range of motion .  CHEST:  Clear to auscultation and percussion.  HEART:  Regular rate and rhythm.  ABDOMEN:  Soft and nontender.  No masses.  Bowel sounds 2+.  EXTREMITIES:  Right hip internal and external rotation of 10 degrees each  with pain.  Forward flexion 90 degrees.  Positive foot tap.  Neurovascularly  intact.  SKIN:  A well-healed normal scar.   LABORATORY DATA:  Preoperative labs, including CBC, CMET, chest x-ray, EKG,  PT, and PTT were all within normal limits.  X-rays showed a PFL cup  loosening with changes to vertical and positive hardware and the stem  appears to be well fixed.   HOSPITAL COURSE:  On the day of admission, the patient was taken to the  operating room at [facility] where she underwent a revision right total  hip arthroplasty using Osteonics 52 mm __________ cup with three screws in  the acetabulum, a 10 degree liner, anterior and posterior to accept a 32 mm  inferoposterior acrylic chrome head.  The stem was found to be clinically  stable, but the femoral head component was revised.  No drains were placed  in the wound.  The patient was placed with a Foley drain catheter.  The  patient was placed on perioperative antibiotics for the first 48 hours.  She  was placed on Coumadin prophylaxis with a target INR of 1.5-2.  She was  begun with physical therapy the first postoperative day.  On the first  postoperative day, the patient was awake and alert.  Pain was well  controlled with PCA.  No nausea or vomiting.  She did report  some weakness  and dizziness when she tried to arise.  Temperature maximum 100.2 degrees.  Vital signs were stable.  The dressing was dry.  She was neurovascularly  intact throughout.  Hemoglobin 7.7.  Urine output 70.  She was otherwise  stable with postoperative anemia.  Because of this, she was transfused with  two units autologous blood.  The Foley was continued for an additional 24  hours.  On postoperative day #2, the patient was without complaint.  Vital  signs were stable.  The hemoglobin had increased to 9.2.  INR 1.9.  The  dressing was dry.  The calf was soft.  She was otherwise neurovascularly  intact.  A rehabilitation consult was obtained.  They felt that the patient  would likely need inpatient rehabilitation and they followed her for this as  she continued with her therapy.  On postoperative day #3, the patient was  awake and alert.  She was using a walker in the room and going to the  bathroom with minimal assistance.  The vital signs were stable.  The wound  was clean and dry.  Hemoglobin 9.1.  She was otherwise stable.  She was  okayed for admission to rehabilitation, but no beds were available.  On  postoperative day #4, the patient was without complaints.  She was afebrile.  The hemoglobin remained stable.  She was otherwise unchanged other than  making slow, but steady progress with PT.  On postoperative day #5, the  patient was without complaint and was ready to go home.  She did not wish to  continue to wait for a rehabilitation bed.  Because she was making rather  rapid turnaround, she was discharged home on that day.  She was afebrile.  Vital signs were stable.  INR 1.9, PT 19.1.  The dressing was dry.  The calf  was soft.  She was neurovascularly intact.  She was discharged home into the  care of her family where she has supportive family and friends.  She has  passed her goals for independent transfers.  DIET:  Regular.   DISCHARGE MEDICATIONS:  Home  medications were resumed.  Prescriptions for  Tylox and Coumadin were given to the patient.   WOUND CARE:  Dressing changes daily.  She may shower seated.   FOLLOWUP:  Return to the clinic in five days' time.   ACTIVITY:  Home health PT and home health R.N.  Activity is weightbearing as  tolerated with total hip precautions.     Laural Benes. Jannet Mantis.                     Feliberto Gottron. Turner Daniels, M.D.    Merita Norton  D:  02/05/2003  T:  02/05/2003  Job:  [id number]

## 2011-01-27 NOTE — Op Note (Signed)
NAME:  Katherine Flynn, Katherine Flynn                     ACCOUNT NO.:  [id number]   MEDICAL RECORD NO.:  [id number]                   PATIENT TYPE:  INP   LOCATION:  5004                                 FACILITY:  [facility]   PHYSICIAN:  Feliberto Gottron. Turner Daniels, M.D.                DATE OF BIRTH:  15-Feb-1923   DATE OF PROCEDURE:  12/15/2002  DATE OF DISCHARGE:                                 OPERATIVE REPORT   PREOPERATIVE DIAGNOSIS:  Loose right total hip arthroplasty with socket that  has come loose, gone vertical and the head is subluxed out and is resting in  the acetabulum.   POSTOPERATIVE DIAGNOSIS:  Loose right total hip arthroplasty with socket  that has come loose, gone vertical and the head is subluxed out and is  resting in the acetabulum.   OPERATION PERFORMED:  Right total hip revision arthroplasty using Osteonics  52 mm Dual Geometry cup with three screws into the acetabulum, a 10 degree  liner indexed posterior and superior to accept a 32 mm NK+0 cobalt chrome  head.  The stem itself was found to be clinically stable but the femoral  head component was revised.   SURGEON:  Feliberto Gottron. Turner Daniels, M.D.   ASSISTANTLaural Benes. Jannet Mantis.   ANESTHESIA:  General endotracheal.   ESTIMATED BLOOD LOSS:  300 ml.   FLUIDS REPLACED:  1L crystalloid.   DRAINS:  None.   TOURNIQUET TIME:  None.   INDICATIONS FOR PROCEDURE:  The patient is a 77 year old woman who has a  total hip arthroplasty implanted on the right side by one of my partners 11  years ago.  She has had loosening of the acetabular component which is now  grossly loose.  The component has gone pure vertical.  The head is subluxed  out and is resting in the dome of the acetabulum.  She is to undergo  revision arthroplasty to decrease pain and increase function.  The femoral  component which appears to be an HA coated Omni-Flex stem does have spot  welding and appears to be well fixed.  It will be revised only if it is  loose.   DESCRIPTION OF PROCEDURE:  The patient was identified by arm band and taken  to the operating room at [facility] where appropriate anesthetic  monitors were attached.  General endotracheal anesthesia induced with the  patient in supine position.  She was then rolled into the left lateral  decubitus position, fixed there with a Stulberg Mark II pelvic clamp and the  right lower extremity prepped and draped in the usual sterile fashion from  the ankle to the hemi-pelvis.  The skin along the lateral hip and thigh was  infiltrated with 20 ml of 0.5% Marcaine with epinephrine solution and  utilizing the old posterolateral approach, we began the procedure by making  a 20 cm incision centered over the greater trochanter through the skin and  subcutaneous tissue.  We incised the iliotibial band in line with the skin  incision exposing the greater trochanter and the posterior pseudocapsule.  Coming down the gluteus medius starting about an inch and a half off the  greater trochanter, we came down from posterolateral right along the  intertrochanteric crest and peeled the pseudocapsule off the neck of the  femur.  This was then tagged with three #2  Ethibond sutures, exposing the  subluxed femoral head which was then easily removed with the femoral head  extractor.  We then set about removing pseudocapsule.  We identified the  vertical cuff which was grossly loose.  The cultures were sent.  The Gram  stain came back negative for organisms or white cells.  Once we had released  the anterior superior pseudocapsule, we were able to tuck the femoral neck  anterior and superior to the acetabulum and this allowed [place] to remove the  polyethylene liner with a quarter inch osteotome and then remove the screws  that were still in the acetabulum.  One of them was well fixed and allowing  removal of the Osteonics PSL HA coated acetabular component.  We then  removed pseudomembrane from within the  acetabulum and assessed our anterior  and posterior columns which fortunately were found to be intact.  There were  some small cavitary defects that we later packed with allograft bone chips  soaked in saline.  Satisfied with the condition of the acetabulum, we then  sequentially reamed up to a 52 mm basket reamer followed by an Osteonics  conical cutting device, 52 mm and about 45 degrees of abduction and 20  degrees of anteversion and obtained adequate cut into the anterior and  posterior column.  The small cavitary defects where the screws had  previously been placed were then curetted out and packed with allograft bone  chips and a 52 mm Dual Geometry cup that would accept locking screws was  then hammered into place with a good fit and fill and clinically was stable  with traction on the insertion device.  We then placed three dome screws  obtaining good bite to all three.  The center one was 40 mm, the anterior  one was 20 and the posterior 16 mm.  A 10 degree liner indexed posterior and  superior trial was then placed in the socket.  NK+0 32 mm ball placed on the  stem, the hip reduced and excellent stability was noted.  We then dislocated  the hip once again and removed the trial components, put a vice grip on the  femoral stem and applied rotatory force and found the stem to be quite  stable.  There was some osteolysis proximally around the stem.  We removed  that pseudomembrane and packed more allograft chips in that area but you  could easily see the spot weld that was about a quarter inch down the  femoral shaft where the bone had spot welded to the HA coated Omni-Flex  stem.  The wound was then thoroughly water picked clean.  A 32 mm  polyethylene liner was then  hammered in place, indexed posterior and  superior and NK+0 32 mm cobalt chromic L fit ball was hammered onto the  stem.  The hip again reduced and stability found to be excellent.  We once again pulse lavaged the  wound clean.  The pseudocapsule was repaired back to  the intertrochanteric crest through drill holes with a #2 Ethibond suture.  The iliotibial  band was closed with running  #1 Vicryl suture.  There was  minimal bleeding.  Therefore, no drains were placed.  The subcutaneous  tissue was closed with 0 and 2-0 undyed Vicryl suture and the skin with  running interlocking 3-0 nylon suture.  A dressing of Xeroform, 4 x 4  dressing sponges, and HypaFix tape applied.  The patient was rolled supine,  awakened and taken to the recovery room without difficulty.                                                Feliberto Gottron. Turner Daniels, M.D.    Ovid Curd  D:  12/15/2002  T:  12/15/2002  Job:  [id number]

## 2011-01-27 NOTE — Op Note (Signed)
[facility]  Patient:    Katherine Flynn, Katherine Flynn Visit Number: [id number] MRN: [id number]          Service Type: GYN Location: 3W 0342 01 Attending Physician:  Melony Overly Dictated by:   Devoria Albe Edward Jolly, M.D. Proc. Date: 02/05/02 Admit Date:  02/05/2002                             Operative Report  PREOPERATIVE DIAGNOSES: 1. Genuine stress incontinence. 2. Pelvic organ prolapse. 3. History of postmenopausal bleeding.  POSTOPERATIVE DIAGNOSES: 1. Genuine stress incontinence. 2. Pelvic organ prolapse. 3. History of postmenopausal bleeding. 4. Endometrial polyp with cystic hyperplasia.  PROCEDURES:  Total abdominal hysterectomy, bilateral salpingo-oophorectomy, abdominal Burch procedure, cystoscopy, suprapubic catheter placement.  SURGEON:  Brook A. Edward Jolly, M.D.  ASSISTANT:  Miguel Aschoff, M.D.  ANESTHESIA:  General endotracheal.  INTRAVENOUS FLUIDS:  2200 cc Ringers lactate.  ESTIMATED BLOOD LOSS:  200 cc.  URINE OUTPUT:  600 cc.  COMPLICATIONS:  None.  INDICATIONS FOR PROCEDURE:  The patient was a 77 year old, gravida 58, para 40, Caucasian female, who presented originally for evaluation of bladder prolapse and urinary incontinence in addition to a history of postmenopausal bleeding in June 2002.  At that time, the patient had a normal Pap smear and a pelvic ultrasound which documented and endometrial thickness of 27.3 mm with numerous cystic areas.  An endometrial biopsy documented a cellular mucoid material, and the patient therefore underwent a repeat endometrial sampling in the office which documented a blood clot and scant benign endocervix and squamous mucosa.  There was no endometrial component identified at that time.  The patient requested treatment of the bladder prolapse and urinary incontinence, and she therefore underwent multichannel urodynamic testing which documented genuine stress incontinence.  The patient wished for a  definitive surgical treatment of the incontinence, prolapse, and bleeding, and a decision was therefore made to proceed with a hysterectomy and repair instead of pursuing a D&C.  The patient denied any rectal symptoms and did not have any symptoms of fecal incontinence.  A plan was made after the patient was recently reexamined in the office to proceed with a total abdominal hysterectomy with bilateral salpingo-oophorectomy and abdominal Burch procedure in addition to cystoscopy and suprapubic catheter placement.  The plan was made for a possible anterior and posterior colporrhaphy based on the patients examination under anesthesia.  An abdominal route was chosen, as the patients cervix was noted to be flush with the vagina and had minimal uterine prolapse.  She was noted to have a second to third-degree cystocele and approximately a first-degree rectocele.  The patient did chose to proceed with surgery after the risks, benefits, and alternatives were discussed with her.  FINDINGS:  Under anesthesia, the patient was noted to have a second to third-degree cystocele and a first-degree rectocele.  At the time of laparotomy, the uterine serosal surfaces were noted to be unremarkable.  The ovaries were atrophic but otherwise had a no appearance, and the tubes were consistent with a bilateral partial salpingectomy.  All peritoneal surfaces were without excrescences.  Exploration of the upper abdomen demonstrated a smooth liver and normal kidneys.  The spleen was not enlarged, and the periaortic region demonstrated no evidence of any adenopathy.  The regions of the pelvic lymph nodes were also palpated and, again, there was no evidence of any adenopathy.  The uterus was sent for frozen section and was found to have a  large polyp on a small stalk which had evidence of cystic hyperplasia and no evidence of malignancy.  Cystoscopy after completing the abdominal Burch procedure documented  no sutures in the bladder or urethra.  The bladder was visualized throughout 360 degrees and had an normal dome and trigone.  The ureters were noted to be patent bilaterally after the intravenous injection of indigo carmine dye.  SPECIMENS:  The uterus, tubes, and ovaries were all sent to pathology.  DESCRIPTION OF PROCEDURE:  With an IV in place, the patient was taken to the operating room after she was properly re-identified.  The patient did receive Ancef 1 g intravenously for antibiotic prophylaxis, and both compression hose and TED hose for DVT prophylaxis.  The patient was placed in a supine position on the operating room table, and general endotracheal anesthesia was induced. The patient was then placed in the dorsal lithotomy position, and the abdomen and vagina were sterilely prepped and draped.  A 30 cc Foley catheter was then placed inside the urinary bladder.  The procedure then began with a Pfannenstiel incision which was created sharply with a scalpel.  This was carried down to the fascia using monopolar cautery in the subcutaneous tissue.  The fascia was then scored in the midline, and the incision was carried out bilaterally using the Mayo scissors. The rectus muscles were dissected from the rectus fascia using Mayo scissors both superiorly and inferiorly.  The rectus muscles were then divided in the midline with a Kelly clamp, and the parietal peritoneum was grasped and elevated with two hemostats.  It was entered bluntly at this time.  The entry site was examined, and the incision was then extended with the Metzenbaum scissors superiorly and inferiorly.  An examination of the upper abdomen and pelvis was performed at this time, and the findings are as noted above.  A self-retaining retractor was then placed in the pelvis, and the bowel was packed into the upper abdomen.  The hysterectomy was performed at this time by placing two long Kelly clamps across the adnexal  structures bilaterally.  The round ligaments were then each identified and suture ligated with 0 Vicryl.  The round ligaments were then  divided sharply with the Metzenbaum scissors, and the bladder flap was created sharply with the Metzenbaum scissors as well.  A window was created through each of the broad ligaments, and the ureters were identified bilaterally.  The infundibulopelvic ligament on the left hand side was then clamped, divided, and ligated, first with a free tie of 0 Vicryl and then a suture ligature of the same.  Hemostasis was noted to be excellent.  On the patients right hand side, a Heaney clamp was placed adjacent to the previously-placed Kelly clamp, and the broad ligament was then further divided and suture ligated with 0 Vicryl.  This allowed better access for removal of the ovary on this side as it was noted to be atrophic and extending slightly into the broad ligament. Again, the ureter was identified on this side, and a window of tissue was then created through the broad ligament such that the adnexa could be removed on the patients right hand side.  Again, a Heaney clamp was placed through the window of tissue on this side, and the right tube and ovary were excised and sent to pathology later after the uterus and left tube and ovary had been removed.  The pedicle was then tied with two free ties of 0 Vicryl for excellent hemostasis.  The  uterine arteries were then identified bilaterally and were clamped with Heaney clamps, divided sharply, and then suture ligated with 0 Vicryl bilaterally.  The uterosacral ligaments were then clamped, divided sharply with a scalpel, and suture ligated with transfixing sutures of 0 Vicryl bilaterally.  Curved Heaney clamps were then placed across the top of the vagina bilaterally, and the specimen was sharply excised and sent to pathology for frozen section evaluation.  The findings are as noted above. Transfixing sutures of 0  Vicryl were placed at the vaginal cuff bilaterally. A figure-of-eight suture of 0 Vicryl was then placed in the midline of the cuff.  There was a small amount of bleeding on the anterior aspect of the cuff which responded well to monopolar cautery.  The pelvis was irrigated and suctioned, and all pedicle sites were reexamined and found to be hemostatic.  The abdominal Burch procedure was next performed.  The anterior blade of the abdominal retractor was removed over the bladder area.  The space of Retzius was identified.  With 30 cc of fluid in the Foley balloon and a hand in the vagina, the dissection in the retropubic space was performed such that the arcus tendineus fascial pelvis was identified bilaterally.  Coopers ligaments were then identified bilaterally, and the adipose tissue over them was removed.  A series of four Burch sutures of 0 Ethibond were next placed with two sutures on each side of the urethra.  The first suture was a figure-of-eight suture placed in the endopelvic tissue at the level of the mid urethra and to the right of this  on the patients right hand side.  The second suture was placed at the level of the urethrovesical junction just lateral to the first suture.  The same procedure performed on the right hand side was then repeated on the left.  Each arm of the double-armed suture was then brought up through the Coopers ligament on the ipsilateral sides, and the sutures were sequentially tied on top of Coopers ligament while elevating the tissue from below in the vagina.  The elevation was noted to be excellent, and the patient had a good hand lock of support underneath the urethra at this time.  Cystoscopy was next performed, and the findings are as noted above.  The abdomen was next closed after the operative sites were reexamined, and hemostasis was found to be excellent.  The peritoneum was closed with a running suture of 2-0 Vicryl.  The rectus muscles  were then brought together in the midline with interrupted sutures of 0 Vicryl.  The fascia was then closed with a running suture of 0 Vicryl bilaterally.  The subcutaneous tissue was irrigated, and small bleeding vessels were cauterized with excellent hemostasis.  The skin was closed with staples.  The patient next was reexamined with her in the dorsal lithotomy position. The bladder had excellent support at this time, and there was a minimal rectocele appreciated, and a decision was made to conclude the procedure at this time.  The cystoscope was reinserted one final time in the patients bladder in order to fill the bladder for placement of the suprapubic catheter.  The patient was placed in the Trendelenburg position, and the suprapubic catheter was then placed under visualization of the cystoscope without difficulty.  The catheter was secured to the skin with 2-0 silk suture.  A sterile bandage was then placed over the abdominal incision in the suprapubic catheter site, and the patient was cleansed of any remaining Betadine.  She  was taken out of the dorsal lithotomy position, awakened, and extubated.  She was escorted to recovery in stable and awake condition.  There were no complications to the procedure.  All needle, instrument, and sponge counts were correct. Dictated by:   Devoria Albe Edward Jolly, M.D. Attending Physician:  Melony Overly DD:  02/05/02 TD:  02/06/02 Job: [id number]

## 2011-01-27 NOTE — Discharge Summary (Signed)
Katherine Flynn, Katherine Flynn           ACCOUNT NO.:  [id number]   MEDICAL RECORD NO.:  [id number]          PATIENT TYPE:  INP   LOCATION:  5703                         FACILITY:  [facility]   PHYSICIAN:  Sean A. Everardo All, M.D. [facility] OF BIRTH:  06/05/1923   DATE OF ADMISSION:  07/04/2005  DATE OF DISCHARGE:  07/08/2005                                 DISCHARGE SUMMARY   DISCHARGE DIAGNOSES:  1.  Gastritis/esophagitis with pyloric stenosis and stricture.  2.  Status post pylorus stricture dilation July 07, 2005 per Dr. Stan Head.   HISTORY OF PRESENT ILLNESS:  The patient is a 77 year old white female, who  was admitted with a several week history of intermittent epigastric pain  which had intensified on admission.  This is associated with nausea and  vomiting.  Patient was admitted for further evaluation and treatment.   PAST MEDICAL HISTORY:  1.  Hypertension.  2.  Degenerative joint disease.  3.  Hypothyroid.   COURSE OF HOSPITALIZATION:  Problem 1.  Gastritis/esophagitis and pyloric  stenosis.  The patient was evaluated by Dr. Stan Head, GI, and underwent  EGD performed on July 07, 2005.  EGD noted esophagitis which was severe  and ulcerations were present.  The patient was noted on exam to have a large  amount of retained food.  In addition, stricture of the pyloric sphincter  was noted.  Stenosis was dilated on July 07, 2005 up to 10 mm.  At this  time, plan to remain off of aspirin and NSAIDs for now.  Continue Protonix.   Patient will need a colonoscopy to be arranged as an outpatient to address  history of bright red blood per rectum.   MEDICATIONS AT DISCHARGE:  1.  Protonix 40 mg p.o. b.i.d. for 10 days, then 40 mg p.o. daily.  2.  Hydrochlorothiazide 25 mg p.o. daily.  3.  Synthroid 88 mcg p.o. daily.  4.  Lisinopril 20 mg p.o. daily.  5.  Ciprofloxacin 250 mg p.o. b.i.d. x3 additional days for UTI.   LABORATORIES AT DISCHARGE:  Hemoglobin 11.8,  hematocrit 33.7, BUN 12,  creatinine 0.9.   FOLLOWUP:  Patient is to follow up with Dr. Eleonore Chiquito in one to two  weeks and follow up with Dr. Leone Payor.  He will follow up with GI as an  outpatient.  Patient will need an outpatient colonoscopy.      Melissa S. Peggyann Juba, NP    ______________________________  Cleophas Dunker Everardo All, M.D. [facility]    MSO/MEDQ  D:  07/07/2005  T:  07/08/2005  Job:  [id number]   cc:   Gordy Savers, M.D. [facility]  [address] [place]  [place] [address]

## 2011-01-27 NOTE — Discharge Summary (Signed)
[facility]  Patient:    JANIS, SOL Visit Number: [id number] MRN: [id number]          Service Type: GYN Location: 3W 0342 01 Attending Physician:  Melony Overly Dictated by:   Devoria Albe Edward Jolly, M.D. Admit Date:  02/05/2002 Discharge Date: 02/07/2002                             Discharge Summary  ADMISSION DIAGNOSES: 1. Genuine stress incontinence. 2. Pelvic organ prolapse. 3. History of postmenopausal bleeding.  DISCHARGE DIAGNOSES: 1. Status post total abdominal hysterectomy with bilateral    salpingo-oophorectomy, abdominal Burch procedure, cystoscopy and suprapubic    catheter placement. 2. Endometrial polyp with cystic hyperplasia. 3. Postoperative bradycardia, transient. 4. Hypokalemia, resolved. 5. Hypomagnesemia, resolved.  HISTORY OF PRESENT ILLNESS:  The patient was a 77 year old, G12, P46, Caucasian female who presented with a complaint of bladder prolapse and urinary incontinence to her primary gynecologist.  The patient also had recently had postmenopausal bleeding at the time of that presentation.  The patient underwent endometrial sampling on two occasions which was nondiagnostic.  As the patient wished for definitive treatment of her pelvic organ prolapse and stress incontinence, the plan was made to proceed with definitive therapy with hysterectomy at the time of treatment of the incontinence and prolapse.  The patient did undergo multichannel urodynamic testing which documented genuine stress incontinence and detrusor instability.  The patient had adequate voiding.  The patient had no history of any constipation or fecal incontinence.  PAST MEDICAL HISTORY: 1. Hypertension which was treated with hydrochlorothiazide and Accupril. 2. Hypothyroidism treated with Synthroid.  PHYSICAL EXAMINATION:  GENITALIA:  Third-degree cystocele.  There was minimal uterine prolapse and the cervix was noted to be flush with the  vagina.  There was a first-degree rectocele appreciated.  Bimanual examination documented the uterus to be small and nontender and there were no adnexal masses appreciated.  Rectovaginal exam negative for masses.  RECTAL:  Stool was noted to be guaiac negative.  HOSPITAL COURSE:  The patient was admitted for surgery on Feb 05, 2002, at which time she underwent a total abdominal hysterectomy with bilateral salpingo-oophorectomy, abdominal Burch procedure, cystoscopy and suprapubic catheter placement while under general anesthesia.  A frozen section was obtained intraoperatively which documented a 7 cm polyp that had cystic hyperplasia with no evidence of any malignancy.  Cystoscopy documented the absence of sutures in the urethra and the bladder and the ureters were noted to be patent bilaterally.  Estimated blood loss from surgery was 200 cc and the surgery was uncomplicated.  Postoperatively in the postanesthesia care unit, the patient was noted to have an episode of bradycardia with a heart rate in the range of the 30s and then a brief episode of tachycardia with heart rate in the 120s.  She spontaneously converted to sinus rhythm.  Please note that the episode of bradycardia was preceded by a systolic blood pressure in the 200 range which was treated with hydralazine.  The patient did undergo consultation from internal medicine. The patient had an EKG which did show normal sinus rhythm and no evidence of any ischemia.  Serial CK and troponin levels were ordered and the patient was sent to telemetry for further monitoring.  Throughout the remainder of the patients postoperative course, she did remain in normal sinus rhythm.  Dr. Cato Mulligan followed the patient while she was in the hospital.  Her serial CK-MBs were as  follows: 1.3 on Feb 05, 2002, at 2251 hours, 0.3 on Feb 06, 2002, at 0720 hours and 0.9 on Feb 06, 2002, at 1455 hours.  Her serial troponins are as follow: 0.03 on Feb 05, 2002, at 2251 hours, 0.06 on Feb 06, 2002, at 0720 hours and 0.04 on Feb 06, 2002, at 1455 hours.  The patients postoperative potassium was noted to be 3.1 and her magnesium was noted at 1.3.  Both the potassium and the magnesium were replaced and on postop day #1, the potassium returned to a normal level of 3.8 and the magnesium measured 1.9.  The remainder of the electrolytes were within normal limits.  The patient remained asymptomatic and had no evidence of any chest pain.  The patient had a long history of a baseline of mild shortness of breath and this was not increased throughout the hospitalization.  The remainder of the patients postoperative course was unremarkable.  The patient was able to ambulate independently prior to her discharge.  She had good control of her pain and was successfully switched from the morphine PCA and Toradol to Percocet and Motrin.  The patients diet was slowly advanced to normal and she was tolerating this well at the time of her discharge.  The patient did have her Foley catheter removed on postop day #1, and she began using her suprapubic catheter.  At the time of her discharge, she was voiding 125 cc with a postvoid residual of 125 cc as well.  The catheter was therefore left in place at the time of her discharge.  The patient was found to be in good condition and ready for discharge on Feb 07, 2002.  DISPOSITION:  Discharged to home.  DIET:  Regular.  ACTIVITY:  The patient will have decreased activity for the next six weeks. She will not lift anything over 10 pounds for the next three months.  DISCHARGE MEDICATIONS: 1. Percocet one to two p.o. q.4-6h. p.r.n. 2. Ibuprofen 400 mg p.o. q.6h. p.r.n. 3. Iron sulfate 325 mg p.o. t.i.d. 4. Colace 100 mg b.i.d. p.r.n. 5. Multivitamin p.o. q.d. 6. The patient will continue on her regular medications at usual dosages.  SPECIAL INSTRUCTIONS:  Continue with bladder training at home.  She will  call if she experiences fever, increased pain, increased bleeding, a blocked  suprapubic catheter, inability to void, incisional drainage or redness, shortness of breath or chest pain.  FOLLOWUP:  She will follow up in the office in three days for staple removal and possible suprapubic catheter removal. Dictated by:   Devoria Albe. Edward Jolly, M.D. Attending Physician:  Melony Overly DD:  02/07/02 TD:  02/10/02 Job: [id number]

## 2011-01-27 NOTE — Assessment & Plan Note (Signed)
[facility]                            [facility] OFFICE NOTE   ELANOR, CALE                  MRN:          [id number]  DATE:09/12/2006                            DOB:          1922-11-20    The patient is an 77 year old female, seen today for a comprehensive  exam.  She had a recent GI evaluation for severe reflux esophagitis with  stricture formation.  This required repeated dilatation.  She also had a  pyloric ulcer with the associated stenosis.  She had been off PPI  therapy.  She was hospitalized one year ago for complications of the  same.  More recently, she has again stopped her Protonix, due to  increasing gas and nausea.  She has hypertension, DJD and  hypothyroidism.  She has had a number of operations in the past,  including an appendectomy and hysterectomy.  She has had hip surgery  times two.   PHYSICAL EXAMINATION:  Exam revealed an elderly, thin, white female, in  no acute distress.  Blood pressure was below normal.  HEAD AND NECK:  Revealed thyroidectomy scars.  ENT:  Unremarkable.  The patient was edentulous.  NECK:  No bruits.  CHEST:  Clear.  BREASTS:  Negative, atrophic.  CARDIOVASCULAR:  Normal heart sounds, no murmurs.  ABDOMEN:  Benign.  Surgical scars noted.  No bruits.  PELVIC EXAM:  Revealed absent uterus, no adnexal masses.  STOOL:  Heme-negative.  EXTREMITIES:  Revealed the right dorsalis pedis pulse to be intact.  Other peripheral pulses were not easily palpable.  NEUROLOGIC:  Negative.   IMPRESSION:  1. Hypertension.  2. Degenerative joint disease.  3. Severe GERD with stricture formation.  4. Hypothyroidism.   DISPOSITION:  We will give her a trial of omeprazole.  Samples of  Prilosec dispensed.  Medicine regimen otherwise unchanged.  Was given a  new prescription for Darvocet for arthritic pain.  Will recheck in three  months.     Gordy Savers, MD  Electronically Signed    PFK/MedQ  DD: 09/12/2006  DT: 09/12/2006  Job #: [id number]

## 2011-01-27 NOTE — H&P (Signed)
[name], [name] NO.:  [id number]   MEDICAL RECORD NO.:  [id number]          PATIENT TYPE:   LOCATION:                                 FACILITY:   PHYSICIAN:  Gordy Savers, M.D. [facility] OF BIRTH:   DATE OF ADMISSION:  DATE OF DISCHARGE:                                HISTORY & PHYSICAL   CHIEF COMPLAINT:  Abdominal pain.   HISTORY OF PRESENT ILLNESS:  Patient is a 77 year old white female who was  last seen in our office eight months ago.  At that time she complained of  epigastric pain associated with some nausea.  She was treated with a proton-  pump inhibitor at that time and initially improved.  She returns today  stating that she has had intermittent abdominal pain over several months.  This has intensified over the past few days to the point where pain is much  more severe and constant.  It is located in the epigastric area.  She has  lost approximately 7 pounds in weight and is having difficulty tolerating  any oral intake.  Last normal bowel movement was within the past 24 hours.  She complains of considerable belching, chronic nausea, and pain.  The  patient is now admitted for further evaluation and treatment.   PAST MEDICAL HISTORY:  She has a long history of treated hypertension, DJD,  and osteoporosis.  She has had a number of abdominal procedures including a  remote appendectomy in 1946, a hysterectomy with cystocele repair in 2003.  She has had a tubal ligation.  Other surgeries have included two right hip  operations, tubal ligation, cataract surgery, and surgery for a heel spur.  She is a gravida 12, para 9, abortus 3.   MEDICAL REGIMEN:  1.  Synthroid 0.088.  2.  Accupril 20.  3.  HCTZ 25.  4.  Daily aspirin.   FAMILY HISTORY:  Mother died during childbirth.  She is an only child.  Father relocated shortly after birth and she was raised by an aunt.  Details  unknown.   PHYSICAL EXAMINATION:  GENERAL:  Elderly white female  in no acute distress,  although she was uncomfortable with some nausea.  VITAL SIGNS:  Blood pressure 130/80, pulse 80, respiratory rate normal.  There is no increased work of breathing.  SKIN:  Warm and dry.  HEENT:  Normal fundi.  Ear, nose, and throat negative.  Iridectomy scar is  noted.  Dentures in place.  Mucosal membranes appear well hydrated.  NECK:  No bruits or adenopathy.  CHEST:  Clear.  BREASTS:  Negative.  CARDIOVASCULAR:  Normal heart sounds without murmurs.  ABDOMEN:  Considerable epigastric tenderness.  Abdomen was nondistended.  Bowel sounds were present, no guarding.  Multiple surgical scars noted.  RECTAL:  Soft, heme-negative stool.  EXTREMITIES:  No edema.  Dorsalis pedis pulses were intact.  NEUROLOGIC:  Negative.   IMPRESSION:  Intractable abdominal pain, nausea, rule out partial small  bowel obstruction or gastric outlet obstruction.  Rule out peptic ulcer  disease.  Rule out pancreatitis, etc.   DISPOSITION:  The patient will be admitted  to the hospital.  She was for IV  fluids and placed on a clear liquid diet.  A CT of the abdomen will be  reviewed.  Depending on the above and her clinical response will consider  for an upper pan endoscopy.  Will place on a proton-pump inhibitor.  Laboratory studies will include amylase and lipase.           ______________________________  Gordy Savers, M.D. [facility]     PFK/MEDQ  D:  07/04/2005  T:  07/04/2005  Job:  [id number]

## 2011-01-27 NOTE — Discharge Summary (Signed)
NAMEAZELIE, Katherine Flynn           ACCOUNT NO.:  [id number]   MEDICAL RECORD NO.:  [id number]          PATIENT TYPE:  INP   LOCATION:  5703                         FACILITY:  [facility]   PHYSICIAN:  Stacie Glaze, M.D. [facility] OF BIRTH:  1923/01/06   DATE OF ADMISSION:  07/04/2005  DATE OF DISCHARGE:  07/08/2005                                 DISCHARGE SUMMARY   ADMISSION DIAGNOSES:  1.  Dysphagia.  2.  Anemia.   DISCHARGE DIAGNOSES:  1.  Esophageal stricture, status post dilatation.  2.  Severe reflux esophagitis.  3.  Hypertension.   HOSPITAL COURSE:  The patient is a pleasant 77 year old white female,  patient of Dr. Eleonore Chiquito, who was admitted for severe dysphagia and  was found to have a significantly thickened esophagus and stomach on CT scan  with epigastric pain, nausea, vomiting, and dysphagia. She was admitted to  the hospital, placed on IV Protonix, and IV fluids NPO appropriately and  appropriate consultation was obtained with gastroenterology, Dr. Leone Payor.  Dr. Leone Payor evaluated the patient and did an upper endoscopy which revealed  severe esophageal inflammation secondary to reflux with ulcerations present  and a significant pyloric sphincter. The constriction was almost complete  causing severe reflux and food collection in the stomach.   Due to the pyloric stenosis the patient underwent dilatation procedure  successfully and repeat EGD should be performed in six months to assure that  the pyloric symptoms recur.   The patient also had heme-positive stools possibly secondary to the severe  reflux esophagitis, but a colonoscopy should be performed as an outpatient  for complete evaluation of her heme-positive stools.   She is discharged to home in stable condition. She is taking orals well.   DISCHARGE MEDICATIONS:  1.  Protonix 40 mg b.i.d. for ten days, then once a day.  2.  Hydrochlorothiazide 25 mg once a day.  3.  Synthroid 88 mcg once a day.  4.   Lisinopril 20 mg daily.  5.  Cipro 250 mg b.i.d. for three days.           ______________________________  Stacie Glaze, M.D. [facility]     JEJ/MEDQ  D:  07/08/2005  T:  07/08/2005  Job:  [id number]

## 2011-01-27 NOTE — H&P (Signed)
[facility]  Patient:    Katherine Flynn, Katherine Flynn Visit Number: [id number] MRN: [id number]          Service Type: Attending:  Debbe Bales A. Edward Jolly, M.D. Dictated by:   Devoria Albe Edward Jolly, M.D. Adm. Date:  02/05/02                           History and Physical  PREOPERATIVE DIAGNOSES: 1. Pelvic organ prolapse. 2. Genuine stress incontinence. 3. History of postmenopausal bleeding.  HISTORY OF PRESENT ILLNESS:  The patient is a 77 year old G12, P63, Caucasian female who presents with bladder prolapse and urinary incontinence.  The patient was seen by her primary gynecologist, Miguel Aschoff, M.D., in June 2002 for evaluation of the above symptoms in addition to postmenopausal bleeding. The patient had a Pap smear which was within normal limits at that time.  She did undergo a pelvic ultrasound, which showed endometrium which had a thickness up to 27.3 mm with numerous tiny 1 cm cysts.  An endometrial biopsy at that time documented acellular mucoid material.  The patient underwent a repeat endometrial sampling in the office, which documented blood clot with scant benign endocervix and squamous mucosa.  No endometrial component was identified.  The patient requested definitive evaluation and treatment of her pelvic organ prolapse and her stress incontinence, and a decision was therefore made to proceed with hysterectomy with a prolapse repair and surgical treatment of the incontinence, and a D&C was not pursued.  The patient underwent multichannel urodynamic testing, which documented both detrusor instability and genuine stress incontinence, when the bladder was full.  The patient had a good detrusor contraction with her voiding.  The patient denies any fecal incontinence.  PAST OBSTETRIC AND GYNECOLOGIC HISTORY:  Remarkable for nine vaginal deliveries.  The patients Pap smear on February 21, 2001, was within normal limits.  The patient is status post bilateral partial  salpingectomy in 1970.  PAST MEDICAL HISTORY: 1. Hypertension. 2. Hypothyroidism.  PAST SURGICAL HISTORY: 1. Status post bilateral partial salpingectomy in 1970. 2. Status post appendectomy at age 77.  MEDICATIONS:  Hydrochlorothiazide 25 mg p.o. q.d., Accupril 20 mg p.o. q.d., Synthroid 0.088 mg p.o. q.d., potassium, a baby aspirin a day, and occasional Aleve.  ALLERGIES:  No known drug allergies.  SOCIAL HISTORY:  The patient denies the use of tobacco.  She rarely drinks alcohol.  She drinks one cup of coffee per day.  REVIEW OF SYSTEMS:  Please refer to the history of present illness.  PHYSICAL EXAMINATION:  GENERAL:  The patient is an elderly Caucasian female in no acute distress.  NECK:  Negative for adenopathy or thyromegaly.  CHEST:  The lungs are clear to auscultation bilaterally.  CARDIAC:  Regular rate and rhythm and no evidence of a murmur, rub, or gallop.  ABDOMEN:  A midline vertical infraumbilical incision without evidence of herniation.  There is also a right lower quadrant vertical incision without evidence of herniation.  The abdomen is soft and nontender and without hepatosplenomegaly or organomegaly.  PELVIC:  No inguinal adenopathy.  External genitalia is atrophic.  The urethra has no lesions.  There is evidence of a third degree cystocele.  There is minimal uterine prolapse appreciated, and the cervix is noted to be flush with the vagina.  There is a first degree rectocele appreciated.  On bimanual examination the uterus is noted to be small and nontender.  No adnexal masses or tenderness are appreciated.  RECTAL:  No masses,  and the stool was guaiac-negative.  IMPRESSION:  The patient is a 77 year old gravida 66, para 82, Caucasian female with pelvic organ prolapse and urodynamically-proven genuine stress incontinence, who also has had a history of postmenopausal bleeding and a nondiagnostic endometrial sampling.  The patient wishes for  definitive surgical evaluation and treatment of the above.  PLAN:  The patient will undergo a total abdominal hysterectomy with bilateral salpingo-oophorectomy, abdominal Burch procedure with cystoscopy and suprapubic catheter placement, in addition to an anterior and possible posterior colporrhaphy.  The risks, benefits, and alternatives have been discussed with the patient and her daughter, who wish to proceed with surgery at [facility] on Feb 05, 2002. Dictated by:   Devoria Albe Edward Jolly, M.D. Attending:  Devoria Albe. Edward Jolly, M.D. DD:  02/04/02 TD:  02/04/02 Job: [id number]

## 2011-01-27 NOTE — Assessment & Plan Note (Signed)
[facility]                         GASTROENTEROLOGY OFFICE NOTE   Katherine Flynn, Katherine Flynn                  MRN:          [id number]  DATE:08/10/2006                            DOB:          11-Jun-1923    CHIEF COMPLAINT:  Vomiting.   HISTORY:  Katherine Flynn is an 77 year old white woman with known gastric  outlet obstruction due to benign pyloric stenosis in the past.  Last  seen in December of 2006 doing reasonably well, or at least I spoke to  her at that time.  She had done well she says until this week.  She ate  a banana about 2 or 3 days ago and has not really been able to keep food  down since then.  She vomited up liquids this morning but has not really  eaten since about 4 a.m.  She has a lot of burning and esophageal pain  as before.   MEDICATIONS:  Synthroid, Accupril, HCTZ, Protonix.   ALLERGIES:  NONE KNOWN.   PAST MEDICAL HISTORY:  1. Hypothyroidism.  2. Hypertension.  3. Urinary tract infections.  4. Chronic obstructive pulmonary disease.  5. Total hip replacement x2 on the right.  6. Remote tubal ligation.  7. Bladder suspension with hysterectomy.  8. Bilateral salpingo-oophorectomy.  9. Endometrial polyp.  10.Cystic hyperplasia removal.  11.Heel spurs.  12.Gastric outlet obstruction/pyloric stenosis with previous dilation      on October 27 and followup dilation to 12 mm on August 02, 2005.   PHYSICAL EXAMINATION:  VITAL SIGNS:  Height 4 feet 11 inches, weight 96  pounds.  Pulse 85, blood pressure 120/60.  HEENT:  Eyes:  Anicteric.  NECK:  Supple.  CHEST:  Clear.  HEART:  S1 and S2.  No murmurs, rubs, or gallops.  ABDOMEN:  Soft.  She has a succussion splash.  Bowel sounds are present.  There is mild epigastric tenderness.  There is no organomegaly or mass  otherwise.   ASSESSMENT:  Recurrent gastric outlet obstruction from presumed  recurrent benign pyloric stenosis.   PLAN:  Upper GI endoscopy and dilate the  pylorus today.  Possible  hospitalization depending upon the results, but hopefully we can manage  this as an outpatient.     Iva Boop, MD,FACG  Electronically Signed    CEG/MedQ  DD: 08/10/2006  DT: 08/10/2006  Job #: [id number]

## 2011-03-21 ENCOUNTER — Encounter: Payer: Self-pay | Admitting: Internal Medicine

## 2011-03-22 ENCOUNTER — Ambulatory Visit (INDEPENDENT_AMBULATORY_CARE_PROVIDER_SITE_OTHER): Payer: Medicare Other | Admitting: Internal Medicine

## 2011-03-22 ENCOUNTER — Encounter: Payer: Self-pay | Admitting: Internal Medicine

## 2011-03-22 DIAGNOSIS — E039 Hypothyroidism, unspecified: Secondary | ICD-10-CM

## 2011-03-22 DIAGNOSIS — K219 Gastro-esophageal reflux disease without esophagitis: Secondary | ICD-10-CM

## 2011-03-22 DIAGNOSIS — I10 Essential (primary) hypertension: Secondary | ICD-10-CM

## 2011-03-22 DIAGNOSIS — M1990 Unspecified osteoarthritis, unspecified site: Secondary | ICD-10-CM

## 2011-03-22 MED ORDER — HYDROCODONE-ACETAMINOPHEN 5-500 MG PO TABS: 1.0000 | ORAL_TABLET | Freq: Four times a day (QID) | ORAL | Status: DC | PRN

## 2011-03-22 MED ORDER — ESOMEPRAZOLE MAGNESIUM 40 MG PO CPDR: 40.0000 mg | DELAYED_RELEASE_CAPSULE | Freq: Every day | ORAL | Status: DC

## 2011-03-22 MED ORDER — LISINOPRIL 20 MG PO TABS: 20.0000 mg | ORAL_TABLET | Freq: Every day | ORAL | Status: DC

## 2011-03-22 MED ORDER — LEVOTHYROXINE SODIUM 75 MCG PO TABS: 75.0000 ug | ORAL_TABLET | Freq: Every day | ORAL | Status: DC

## 2011-03-22 NOTE — Progress Notes (Signed)
  Subjective:    Patient ID: Katherine Flynn, female    DOB: 1923/07/10, 77 y.o.   MRN: [id number]  HPI  77 year old patient who is seen today for followup. She has a history of osteoarthritis and has a one-month history of worsening right hip pain. She has had right total hip replacement surgery x2. She has seen her orthopedic physician in followup recently.  She has treated hypertension which has been stable she has COPD hypothyroidism. She is accompanied by her son. She has a history of weight loss but this has been stable.    Review of Systems  Constitutional: Negative.   HENT: Negative for hearing loss, congestion, sore throat, rhinorrhea, dental problem, sinus pressure and tinnitus.   Eyes: Negative for pain, discharge and visual disturbance.  Respiratory: Negative for cough and shortness of breath.   Cardiovascular: Negative for chest pain, palpitations and leg swelling.  Gastrointestinal: Negative for nausea, vomiting, abdominal pain, diarrhea, constipation, blood in stool and abdominal distention.  Genitourinary: Negative for dysuria, urgency, frequency, hematuria, flank pain, vaginal bleeding, vaginal discharge, difficulty urinating, vaginal pain and pelvic pain.  Musculoskeletal: Negative for joint swelling, arthralgias and gait problem.  Skin: Negative for rash.  Neurological: Negative for dizziness, syncope, speech difficulty, weakness, numbness and headaches.  Hematological: Negative for adenopathy.  Psychiatric/Behavioral: Negative for behavioral problems, dysphoric mood and agitation. The patient is not nervous/anxious.        Objective:   Physical Exam  Constitutional: She is oriented to person, place, and time. She appears well-developed and well-nourished.       Elderly frail no distress. Walks with a cane. Blood pressure 112/76  HENT:  Head: Normocephalic.  Right Ear: External ear normal.  Left Ear: External ear normal.  Mouth/Throat: Oropharynx is clear and  moist.  Eyes: Conjunctivae and EOM are normal. Pupils are equal, round, and reactive to light.  Neck: Normal range of motion. Neck supple. No thyromegaly present.  Cardiovascular: Normal rate, regular rhythm, normal heart sounds and intact distal pulses.   Pulmonary/Chest: Effort normal and breath sounds normal.  Abdominal: Soft. Bowel sounds are normal. She exhibits no mass. There is no tenderness.  Musculoskeletal: Normal range of motion.       Range of motion right hip did tend to aggravate the discomfort  Lymphadenopathy:    She has no cervical adenopathy.  Neurological: She is alert and oriented to person, place, and time.  Skin: Skin is warm and dry. No rash noted.  Psychiatric: She has a normal mood and affect. Her behavior is normal.          Assessment & Plan:   Osteoarthritis with symptomatic right hip pain. Status post right total hip replacement x2 Hypertension well controlled Hypothyroidism. Gastroesophageal reflux disease  We'll refill her analgesic. A repeat check in 6 months

## 2011-03-22 NOTE — Patient Instructions (Signed)
Limit your sodium (Salt) intake  Call or return to clinic prn if these symptoms worsen or fail to improve as anticipated.  Return in 6 months for follow-up  

## 2011-04-14 ENCOUNTER — Telehealth: Payer: Self-pay | Admitting: Internal Medicine

## 2011-04-14 NOTE — Telephone Encounter (Signed)
This was filled for #[id number] with 3 refills in July.  Further refills will need to be done through primary

## 2011-04-14 NOTE — Telephone Encounter (Signed)
Pt requesting refill on Hydrocodone 90 day supply to be sent to [facility] on [address]. My requested to be called when it was sent to pharmacy.

## 2011-06-13 ENCOUNTER — Encounter: Payer: Self-pay | Admitting: Internal Medicine

## 2011-06-13 ENCOUNTER — Ambulatory Visit (INDEPENDENT_AMBULATORY_CARE_PROVIDER_SITE_OTHER): Payer: Medicare Other | Admitting: Internal Medicine

## 2011-06-13 VITALS — BP 140/80 | Temp 98.3°F | Wt 94.0 lb

## 2011-06-13 DIAGNOSIS — Z0000 Encounter for general adult medical examination without abnormal findings: Secondary | ICD-10-CM

## 2011-06-13 DIAGNOSIS — M1990 Unspecified osteoarthritis, unspecified site: Secondary | ICD-10-CM

## 2011-06-13 DIAGNOSIS — K219 Gastro-esophageal reflux disease without esophagitis: Secondary | ICD-10-CM

## 2011-06-13 DIAGNOSIS — E039 Hypothyroidism, unspecified: Secondary | ICD-10-CM

## 2011-06-13 DIAGNOSIS — Z23 Encounter for immunization: Secondary | ICD-10-CM

## 2011-06-13 DIAGNOSIS — R42 Dizziness and giddiness: Secondary | ICD-10-CM

## 2011-06-13 DIAGNOSIS — I10 Essential (primary) hypertension: Secondary | ICD-10-CM

## 2011-06-13 LAB — CBC WITH DIFFERENTIAL/PLATELET
Basophils Absolute: 0 10*3/uL (ref 0.0–0.1)
Lymphocytes Relative: 33.3 % (ref 12.0–46.0)
Monocytes Relative: 5.9 % (ref 3.0–12.0)
Neutrophils Relative %: 56.3 % (ref 43.0–77.0)
Platelets: 381 10*3/uL (ref 150.0–400.0)
RDW: 14.2 % (ref 11.5–14.6)

## 2011-06-13 LAB — COMPREHENSIVE METABOLIC PANEL
ALT: 15 U/L (ref 0–35)
CO2: 26 mEq/L (ref 19–32)
Calcium: 9.3 mg/dL (ref 8.4–10.5)
Chloride: 105 mEq/L (ref 96–112)
Creatinine, Ser: 0.8 mg/dL (ref 0.4–1.2)
GFR: 73.36 mL/min (ref >60.00)
Sodium: 141 mEq/L (ref 135–145)
Total Protein: 7 g/dL (ref 6.0–8.3)

## 2011-06-13 LAB — TSH: TSH: 2.58 u[IU]/mL (ref 0.35–5.50)

## 2011-06-13 MED ORDER — HYDROCODONE-ACETAMINOPHEN 5-500 MG PO TABS: 1.0000 | ORAL_TABLET | Freq: Four times a day (QID) | ORAL | Status: DC | PRN

## 2011-06-13 MED ORDER — MECLIZINE HCL 25 MG PO TABS: 25.0000 mg | ORAL_TABLET | Freq: Three times a day (TID) | ORAL | Status: DC | PRN

## 2011-06-13 NOTE — Patient Instructions (Signed)
Avoids foods high in acid such as tomatoes citrus juices, and spicy foods.  Avoid eating within two hours of lying down or before exercising.  Do not overheat.  Try smaller more frequent meals.  If symptoms persist, elevate the head of her bed 12 inches while sleeping.  Limit your sodium (Salt) intake  Return in 6 months for follow-up  

## 2011-06-13 NOTE — Progress Notes (Signed)
  Subjective:    Patient ID: Katherine Flynn, female    DOB: 1923-06-14, 77 y.o.   MRN: [id number]  HPI  77 year old patient who is seen today for followup last week she had a two-day episode of mild positional vertigo this has resolved her the past 3 days. She has hypothyroidism hypertension and a history of gastro-esophageal reflux disease she has osteoarthritis and does require hydrocodone usually once daily. She's had no lab studies in over one year. Today she feels that she is back to baseline. She has been compliant with her thyroid medication she has treated hypertension controlled on lisinopril 20 mg daily no significant reflux up   Review of Systems  Constitutional: Negative.   HENT: Negative for hearing loss, congestion, sore throat, rhinorrhea, dental problem, sinus pressure and tinnitus.   Eyes: Negative for pain, discharge and visual disturbance.  Respiratory: Negative for cough and shortness of breath.   Cardiovascular: Negative for chest pain, palpitations and leg swelling.  Gastrointestinal: Negative for nausea, vomiting, abdominal pain, diarrhea, constipation, blood in stool and abdominal distention.  Genitourinary: Negative for dysuria, urgency, frequency, hematuria, flank pain, vaginal bleeding, vaginal discharge, difficulty urinating, vaginal pain and pelvic pain.  Musculoskeletal: Positive for back pain, arthralgias and gait problem. Negative for joint swelling.  Skin: Negative for rash.  Neurological: Negative for dizziness, syncope, speech difficulty, weakness, numbness and headaches.  Hematological: Negative for adenopathy.  Psychiatric/Behavioral: Negative for behavioral problems, dysphoric mood and agitation. The patient is not nervous/anxious.        Objective:   Physical Exam  Constitutional: She is oriented to person, place, and time. She appears well-developed and well-nourished.  HENT:  Head: Normocephalic.  Right Ear: External ear normal.  Left Ear:  External ear normal.  Mouth/Throat: Oropharynx is clear and moist.  Eyes: Conjunctivae and EOM are normal. Pupils are equal, round, and reactive to light.  Neck: Normal range of motion. Neck supple. No thyromegaly present.  Cardiovascular: Normal rate, regular rhythm, normal heart sounds and intact distal pulses.   Pulmonary/Chest: Effort normal and breath sounds normal.  Abdominal: Soft. Bowel sounds are normal. She exhibits no mass. There is no tenderness.  Musculoskeletal: Normal range of motion.  Lymphadenopathy:    She has no cervical adenopathy.  Neurological: She is alert and oriented to person, place, and time.  Skin: Skin is warm and dry. No rash noted.  Psychiatric: She has a normal mood and affect. Her behavior is normal.          Assessment & Plan:   Positional vertigo resolved. Will give a prescription for meclizine to have on hand for when necessary use in the future Hypothyroidism. We'll check a TSH Osteoarthritis. Analgesics refilled Hypertension stable. We'll continue present regimen  Flu shot given Recheck in 6 months

## 2011-06-15 NOTE — Progress Notes (Signed)
Quick Note:  Attempt to call- hm# disconnected , called dean (Son) hm# - ans mach - lmtcb - need to see r/t lab work , anemic. ______

## 2011-06-19 LAB — HEMOGLOBIN AND HEMATOCRIT, BLOOD
HCT: 34.8 — ABNORMAL LOW
Hemoglobin: 12

## 2011-06-19 LAB — BASIC METABOLIC PANEL
CO2: 26
Chloride: 104
Sodium: 136

## 2011-06-19 LAB — COMPREHENSIVE METABOLIC PANEL
AST: 20
Albumin: 2.8 — ABNORMAL LOW
Alkaline Phosphatase: 48
Chloride: 104
Creatinine, Ser: 0.68
Potassium: 3.6
Total Bilirubin: 1
Total Protein: 5.1 — ABNORMAL LOW

## 2011-06-20 ENCOUNTER — Telehealth: Payer: Self-pay | Admitting: Internal Medicine

## 2011-06-20 NOTE — Telephone Encounter (Signed)
Spoke with pt - appt made per dr. Vernon Prey instructions to f/u on labs - anemic.

## 2011-06-20 NOTE — Telephone Encounter (Signed)
Returning a call about lab results . Thanks  °

## 2011-06-20 NOTE — Progress Notes (Signed)
Quick Note:  Spoke with pt - informed of labs and dr. Vernon Prey instructions - appt made for thurs. KIK ______

## 2011-06-22 ENCOUNTER — Other Ambulatory Visit: Payer: Medicare Other

## 2011-06-22 ENCOUNTER — Ambulatory Visit (INDEPENDENT_AMBULATORY_CARE_PROVIDER_SITE_OTHER): Payer: Medicare Other | Admitting: Internal Medicine

## 2011-06-22 ENCOUNTER — Encounter: Payer: Self-pay | Admitting: Internal Medicine

## 2011-06-22 DIAGNOSIS — D649 Anemia, unspecified: Secondary | ICD-10-CM

## 2011-06-22 DIAGNOSIS — Q400 Congenital hypertrophic pyloric stenosis: Secondary | ICD-10-CM

## 2011-06-22 DIAGNOSIS — Z79899 Other long term (current) drug therapy: Secondary | ICD-10-CM

## 2011-06-22 DIAGNOSIS — K219 Gastro-esophageal reflux disease without esophagitis: Secondary | ICD-10-CM

## 2011-06-22 LAB — CBC WITH DIFFERENTIAL/PLATELET
Basophils Absolute: 0 10*3/uL (ref 0.0–0.1)
Eosinophils Absolute: 0.3 10*3/uL (ref 0.0–0.7)
Lymphocytes Relative: 40.6 % (ref 12.0–46.0)
MCHC: 32 g/dL (ref 30.0–36.0)
Monocytes Relative: 7.6 % (ref 3.0–12.0)
Neutrophils Relative %: 43.9 % (ref 43.0–77.0)
RBC: 3.54 Mil/uL — ABNORMAL LOW (ref 3.87–5.11)
RDW: 14.6 % (ref 11.5–14.6)
WBC: 4.5 10*3/uL (ref 4.5–10.5)

## 2011-06-22 LAB — RETICULOCYTES: ABS Retic: 21.6 10*3/uL (ref 19.0–186.0)

## 2011-06-22 LAB — IRON: Iron: 23 ug/dL — ABNORMAL LOW (ref 42–145)

## 2011-06-22 LAB — FOLATE: Folate: >24.8 ng/mL (ref >5.9)

## 2011-06-22 NOTE — Progress Notes (Signed)
  Subjective:    Patient ID: Katherine Flynn, female    DOB: 1923/04/21, 77 y.o.   MRN: [id number]  HPI  77 year old patient who was seen recently for general medical followup. Laboratory data was performed it revealed the moderate to severe anemia. Hemoglobin was 9.7 g percent it was normochromic and normocytic no history of B12 deficiency or prior history of anemia. She does have a history of pyloric stenosis and is on chronic Nexium. Denies any change in her bowel habits she generally feels well;  other laboratory studies were fairly noncontributory.    Review of Systems  Constitutional: Negative.   HENT: Negative for hearing loss, congestion, sore throat, rhinorrhea, dental problem, sinus pressure and tinnitus.   Eyes: Negative for pain, discharge and visual disturbance.  Respiratory: Negative for cough and shortness of breath.   Cardiovascular: Negative for chest pain, palpitations and leg swelling.  Gastrointestinal: Negative for nausea, vomiting, abdominal pain, diarrhea, constipation, blood in stool and abdominal distention.  Genitourinary: Negative for dysuria, urgency, frequency, hematuria, flank pain, vaginal bleeding, vaginal discharge, difficulty urinating, vaginal pain and pelvic pain.  Musculoskeletal: Negative for joint swelling, arthralgias and gait problem.  Skin: Negative for rash.  Neurological: Negative for dizziness, syncope, speech difficulty, weakness, numbness and headaches.  Hematological: Negative for adenopathy.  Psychiatric/Behavioral: Negative for behavioral problems, dysphoric mood and agitation. The patient is not nervous/anxious.        Objective:   Physical Exam  Constitutional: She is oriented to person, place, and time. She appears well-developed and well-nourished.  HENT:  Head: Normocephalic.  Right Ear: External ear normal.  Left Ear: External ear normal.  Mouth/Throat: Oropharynx is clear and moist.  Eyes: Conjunctivae and EOM are normal.  Pupils are equal, round, and reactive to light.  Neck: Normal range of motion. Neck supple. No thyromegaly present.  Cardiovascular: Normal rate, regular rhythm, normal heart sounds and intact distal pulses.   Pulmonary/Chest: Effort normal and breath sounds normal.  Abdominal: Soft. Bowel sounds are normal. She exhibits no mass. There is no tenderness. There is no rebound and no guarding.  Genitourinary: Guaiac negative stool.  Musculoskeletal: Normal range of motion.  Lymphadenopathy:    She has no cervical adenopathy.  Neurological: She is alert and oriented to person, place, and time.  Skin: Skin is warm and dry. No rash noted.  Psychiatric: She has a normal mood and affect. Her behavior is normal.          Assessment & Plan:   Normochromic normocytic anemia unclear etiology. Stool hematest negative today  Plan we'll check stool for occult blood x4 we'll initiate an anemia workup with Ferritin  and a B12 level

## 2011-06-22 NOTE — Patient Instructions (Signed)
Return in one month for follow-up  Take iron and a multivitamin daily

## 2011-06-26 ENCOUNTER — Telehealth: Payer: Self-pay | Admitting: Internal Medicine

## 2011-06-26 ENCOUNTER — Other Ambulatory Visit: Payer: Self-pay | Admitting: Internal Medicine

## 2011-06-26 ENCOUNTER — Encounter: Payer: Self-pay | Admitting: Internal Medicine

## 2011-06-26 DIAGNOSIS — D649 Anemia, unspecified: Secondary | ICD-10-CM

## 2011-06-26 NOTE — Telephone Encounter (Signed)
Dr. Kirtland Bouchard - Per the GI referral, we made an appt for [facility] with Dr. Leone Payor, as she has seen him in the past. She does not care for him and does not want to see him. She would like to see [employer]? I could not find anything on this provider. Are you familiar with the name? Please advise on where to go from here. Thanks.

## 2011-06-26 NOTE — Progress Notes (Signed)
Quick Note:  Spoke with pt - informed of dr. Vernon Prey instructions - order for gi DONE ______

## 2011-06-27 ENCOUNTER — Telehealth: Payer: Self-pay | Admitting: Internal Medicine

## 2011-06-27 MED ORDER — LEVOTHYROXINE SODIUM 75 MCG PO TABS: 75.0000 ug | ORAL_TABLET | Freq: Every day | ORAL | Status: DC

## 2011-06-27 MED ORDER — LISINOPRIL 20 MG PO TABS: 20.0000 mg | ORAL_TABLET | Freq: Every day | ORAL | Status: DC

## 2011-06-27 MED ORDER — ESOMEPRAZOLE MAGNESIUM 40 MG PO CPDR: 40.0000 mg | DELAYED_RELEASE_CAPSULE | Freq: Every day | ORAL | Status: DC

## 2011-06-27 NOTE — Telephone Encounter (Signed)
ok 

## 2011-06-27 NOTE — Telephone Encounter (Signed)
In order for pt to switch to another physician within their practice they need to know why the pt no longer wants to see Dr. Leone Payor.   Then, Dr. Leone Payor and the other physician both have to agree to this.  Please let me know. Thanks, Camelia Eng

## 2011-06-27 NOTE — Telephone Encounter (Signed)
Ok to do referral to [facility] - wants different doctor - not dr. Leone Payor.

## 2011-06-27 NOTE — Telephone Encounter (Signed)
Wants  to speak with Selena Batten about a referral. She has a corrected name. She would like to see Dr Bertram Savin. Thanks.

## 2011-06-27 NOTE — Telephone Encounter (Signed)
Dr. Bertram Savin is a General Surgeon, not a GI physician.  If Dr. Kirtland Bouchard wants this pt to see a General Surgeon, please send me a new referral.     Thanks, Romuald Mccaslin

## 2011-06-27 NOTE — Telephone Encounter (Signed)
This name is for Gi referral - ok to see this doctor?

## 2011-06-28 ENCOUNTER — Telehealth: Payer: Self-pay | Admitting: Family Medicine

## 2011-06-28 NOTE — Telephone Encounter (Signed)
Pt requesting a call about one of the doctors she was referred to. She declined to give me more information.

## 2011-06-29 NOTE — Telephone Encounter (Signed)
Spoke with pt - answered questions - still requesting to see alternate doctor in that practice. KIK

## 2011-07-20 ENCOUNTER — Ambulatory Visit: Payer: Medicare Other | Admitting: Internal Medicine

## 2011-08-31 ENCOUNTER — Encounter: Payer: Self-pay | Admitting: Internal Medicine

## 2011-08-31 ENCOUNTER — Ambulatory Visit (INDEPENDENT_AMBULATORY_CARE_PROVIDER_SITE_OTHER): Payer: Medicare Other | Admitting: Internal Medicine

## 2011-08-31 DIAGNOSIS — I10 Essential (primary) hypertension: Secondary | ICD-10-CM

## 2011-08-31 DIAGNOSIS — E039 Hypothyroidism, unspecified: Secondary | ICD-10-CM

## 2011-08-31 DIAGNOSIS — D649 Anemia, unspecified: Secondary | ICD-10-CM

## 2011-08-31 DIAGNOSIS — K219 Gastro-esophageal reflux disease without esophagitis: Secondary | ICD-10-CM

## 2011-08-31 DIAGNOSIS — M1990 Unspecified osteoarthritis, unspecified site: Secondary | ICD-10-CM

## 2011-08-31 LAB — CBC WITH DIFFERENTIAL/PLATELET
Basophils Absolute: 0 10*3/uL (ref 0.0–0.1)
Basophils Relative: 0.7 % (ref 0.0–3.0)
Eosinophils Absolute: 0.3 10*3/uL (ref 0.0–0.7)
Lymphocytes Relative: 24.1 % (ref 12.0–46.0)
MCHC: 33.3 g/dL (ref 30.0–36.0)
MCV: 84.9 fl (ref 78.0–100.0)
Monocytes Absolute: 0.4 10*3/uL (ref 0.1–1.0)
Neutro Abs: 4 10*3/uL (ref 1.4–7.7)
Neutrophils Relative %: 64.2 % (ref 43.0–77.0)
RBC: 4.1 Mil/uL (ref 3.87–5.11)
RDW: 18.8 % — ABNORMAL HIGH (ref 11.5–14.6)

## 2011-08-31 MED ORDER — MECLIZINE HCL 25 MG PO TABS: 25.0000 mg | ORAL_TABLET | Freq: Three times a day (TID) | ORAL | Status: AC | PRN

## 2011-08-31 NOTE — Patient Instructions (Signed)
Return in 4 months for follow-up  Limit your sodium (Salt) intake  

## 2011-08-31 NOTE — Progress Notes (Signed)
  Subjective:    Patient ID: Katherine Flynn, female    DOB: 17-Aug-1923, 77 y.o.   MRN: [id number]  HPI  77 year old patient who is in today for followup. She has a history of anemia and pyloric stenosis. Since her last visit here she was referred to GI for evaluation of anemia there is no evidence of GI bleeding. She feels well today complains of some unsteadiness of gait and occasional mild vertigo with the postural change. She has treated hypertension which has been stable. In general does quite well    Review of Systems  HENT: Negative for hearing loss, congestion, sore throat, rhinorrhea, dental problem, sinus pressure and tinnitus.   Eyes: Negative for pain, discharge and visual disturbance.  Respiratory: Negative for cough and shortness of breath.   Cardiovascular: Negative for chest pain, palpitations and leg swelling.  Gastrointestinal: Negative for nausea, vomiting, abdominal pain, diarrhea, constipation, blood in stool and abdominal distention.  Genitourinary: Negative for dysuria, urgency, frequency, hematuria, flank pain, vaginal bleeding, vaginal discharge, difficulty urinating, vaginal pain and pelvic pain.  Musculoskeletal: Positive for back pain, arthralgias and gait problem. Negative for joint swelling.  Skin: Negative for rash.  Neurological: Positive for weakness. Negative for dizziness, syncope, speech difficulty, numbness and headaches.  Hematological: Negative for adenopathy.  Psychiatric/Behavioral: Negative for behavioral problems, dysphoric mood and agitation. The patient is not nervous/anxious.        Objective:   Physical Exam  Constitutional: She is oriented to person, place, and time. She appears well-developed and well-nourished.       130/80  HENT:  Head: Normocephalic.  Right Ear: External ear normal.  Left Ear: External ear normal.  Mouth/Throat: Oropharynx is clear and moist.  Eyes: Conjunctivae and EOM are normal. Pupils are equal, round, and  reactive to light.  Neck: Normal range of motion. Neck supple. No thyromegaly present.  Cardiovascular: Normal rate, regular rhythm, normal heart sounds and intact distal pulses.   Pulmonary/Chest: Effort normal and breath sounds normal.  Abdominal: Soft. Bowel sounds are normal. She exhibits no mass. There is no tenderness.  Musculoskeletal: Normal range of motion.  Lymphadenopathy:    She has no cervical adenopathy.  Neurological: She is alert and oriented to person, place, and time.  Skin: Skin is warm and dry. No rash noted.  Psychiatric: She has a normal mood and affect. Her behavior is normal.          Assessment & Plan:   History of anemia. We'll check a followup CBC Hypertension well controlled Gait instability History of vertigo  Recheck 4 months

## 2011-09-22 ENCOUNTER — Ambulatory Visit: Payer: Medicare Other | Admitting: Internal Medicine

## 2011-12-02 ENCOUNTER — Other Ambulatory Visit: Payer: Self-pay | Admitting: Internal Medicine

## 2012-06-29 ENCOUNTER — Other Ambulatory Visit: Payer: Self-pay | Admitting: Internal Medicine

## 2012-07-02 ENCOUNTER — Telehealth: Payer: Self-pay | Admitting: Internal Medicine

## 2012-07-02 MED ORDER — OMEPRAZOLE 40 MG PO CPDR: 40.0000 mg | DELAYED_RELEASE_CAPSULE | Freq: Every day | ORAL | Status: DC

## 2012-07-02 NOTE — Telephone Encounter (Signed)
Omeprazole 40 mg #90 one daily refill x3

## 2012-07-02 NOTE — Telephone Encounter (Signed)
done

## 2012-07-02 NOTE — Telephone Encounter (Signed)
Please advise 

## 2012-07-02 NOTE — Telephone Encounter (Signed)
Pt can know longer afford nexium 40mg  cost over 150.00 for 90 day supply  requesting something cheaper. [facility] [place]

## 2012-12-30 ENCOUNTER — Encounter: Payer: Self-pay | Admitting: Internal Medicine

## 2012-12-30 ENCOUNTER — Ambulatory Visit (INDEPENDENT_AMBULATORY_CARE_PROVIDER_SITE_OTHER): Payer: Medicare Other | Admitting: Internal Medicine

## 2012-12-30 ENCOUNTER — Telehealth: Payer: Self-pay | Admitting: *Deleted

## 2012-12-30 VITALS — BP 142/80 | HR 110 | Temp 98.2°F | Resp 20 | Wt 98.0 lb

## 2012-12-30 DIAGNOSIS — E039 Hypothyroidism, unspecified: Secondary | ICD-10-CM

## 2012-12-30 DIAGNOSIS — D509 Iron deficiency anemia, unspecified: Secondary | ICD-10-CM

## 2012-12-30 DIAGNOSIS — D649 Anemia, unspecified: Secondary | ICD-10-CM

## 2012-12-30 DIAGNOSIS — R634 Abnormal weight loss: Secondary | ICD-10-CM

## 2012-12-30 DIAGNOSIS — I10 Essential (primary) hypertension: Secondary | ICD-10-CM

## 2012-12-30 DIAGNOSIS — K219 Gastro-esophageal reflux disease without esophagitis: Secondary | ICD-10-CM

## 2012-12-30 DIAGNOSIS — M1990 Unspecified osteoarthritis, unspecified site: Secondary | ICD-10-CM

## 2012-12-30 LAB — FERRITIN: Ferritin: 13.6 ng/mL (ref 10.0–291.0)

## 2012-12-30 LAB — COMPREHENSIVE METABOLIC PANEL
ALT: 13 U/L (ref 0–35)
AST: 22 U/L (ref 0–37)
Albumin: 3.7 g/dL (ref 3.5–5.2)
CO2: 26 mEq/L (ref 19–32)
Calcium: 8.9 mg/dL (ref 8.4–10.5)
Chloride: 105 mEq/L (ref 96–112)
Creatinine, Ser: 0.8 mg/dL (ref 0.4–1.2)
GFR: 72.04 mL/min (ref >60.00)
Potassium: 3.5 mEq/L (ref 3.5–5.1)
Total Protein: 6.6 g/dL (ref 6.0–8.3)

## 2012-12-30 LAB — CBC WITH DIFFERENTIAL/PLATELET
Basophils Absolute: 0 10*3/uL (ref 0.0–0.1)
Eosinophils Absolute: 0.1 10*3/uL (ref 0.0–0.7)
Hemoglobin: 7.4 g/dL — CL (ref 12.0–15.0)
Lymphocytes Relative: 15.2 % (ref 12.0–46.0)
MCHC: 32.8 g/dL (ref 30.0–36.0)
Monocytes Relative: 4.7 % (ref 3.0–12.0)
Neutrophils Relative %: 79 % — ABNORMAL HIGH (ref 43.0–77.0)
Platelets: 399 10*3/uL (ref 150.0–400.0)
RDW: 17.8 % — ABNORMAL HIGH (ref 11.5–14.6)

## 2012-12-30 LAB — SEDIMENTATION RATE: Sed Rate: 70 mm/hr — ABNORMAL HIGH (ref 0–22)

## 2012-12-30 LAB — TSH: TSH: 16.77 u[IU]/mL — ABNORMAL HIGH (ref 0.35–5.50)

## 2012-12-30 MED ORDER — HYDROCODONE-ACETAMINOPHEN 5-325 MG PO TABS: 1.0000 | ORAL_TABLET | Freq: Four times a day (QID) | ORAL | Status: DC | PRN

## 2012-12-30 NOTE — Telephone Encounter (Signed)
Spoke to pt told her Hemoglobin came back at 7.4 very low, need to start Iron OTC one tablet three times a day. Also need to stop Aleve per Dr. Amador Cunas and need to come in Friday morning for repeat Hemoglobin and GI consult. Pt verbalized understanding. Told pt I will have schedulers call her for appt Friday and someone will contact her regarding GI. Pt verbalized understanding.

## 2012-12-30 NOTE — Progress Notes (Signed)
Subjective:    Patient ID: Katherine Flynn, female    DOB: July 19, 1923, 79 y.o.   MRN: [id number]  HPI  79 year old patient who is seen today for followup. She has not been seen here in about a year and a half. At that time she was noted to have significant microcytic anemia and was set up for GI consultation. This apparently never happened. Today she has done fairly well. Her weight has been stable. Complaints include fatigue and occasional insomnia. She has a history of osteoarthritis gastroesophageal reflux disease as well as treated hypertension.  Wt Readings from Last 3 Encounters:  12/30/12 98 lb (44.453 kg)  08/31/11 97 lb (43.999 kg)  06/22/11 95 lb (43.092 kg)   Past Medical History  Diagnosis Date  . GERD (gastroesophageal reflux disease)   . Hypertension   . Thyroid disease   . COPD (chronic obstructive pulmonary disease)   . Osteoarthritis   . Weight loss   . History of pyloric stenosis   . Chronic pain syndrome   . Macular degeneration     History   Social History  . Marital Status: Widowed    Spouse Name: N/A    Number of Children: N/A  . Years of Education: N/A   Occupational History  . Not on file.   Social History Main Topics  . Smoking status: Never Smoker   . Smokeless tobacco: Never Used  . Alcohol Use: No  . Drug Use: No  . Sexually Active: Not on file   Other Topics Concern  . Not on file   Social History Narrative  . No narrative on file    Past Surgical History  Procedure Laterality Date  . Appendectomy    . Cataract extraction    . Orif hip fracture    . Tubal ligation    . [employer] fundoplication  08/2007    History reviewed. No pertinent family history.  No Known Allergies  Current Outpatient Prescriptions on File Prior to Visit  Medication Sig Dispense Refill  . levothyroxine (SYNTHROID, LEVOTHROID) 75 MCG tablet TAKE ONE TABLET BY MOUTH EVERY DAY  90 tablet  1  . lisinopril (PRINIVIL,ZESTRIL) 20 MG tablet TAKE ONE TABLET  BY MOUTH EVERY DAY  90 tablet  1  . Omega-3 Fatty Acids (FISH OIL) 300 MG CAPS Take by mouth daily.        Marland Kitchen omeprazole (PRILOSEC) 40 MG capsule Take 1 capsule (40 mg total) by mouth daily.  90 capsule  3   No current facility-administered medications on file prior to visit.    BP 142/80  Pulse 110  Temp(Src) 98.2 F (36.8 C) (Oral)  Resp 20  Wt 98 lb (44.453 kg)  BMI 20.13 kg/m2  SpO2 98%    Review of Systems  Constitutional: Positive for fatigue.  HENT: Negative for hearing loss, congestion, sore throat, rhinorrhea, dental problem, sinus pressure and tinnitus.   Eyes: Negative for pain, discharge and visual disturbance.  Respiratory: Negative for cough and shortness of breath.   Cardiovascular: Negative for chest pain, palpitations and leg swelling.  Gastrointestinal: Negative for nausea, vomiting, abdominal pain, diarrhea, constipation, blood in stool and abdominal distention.  Genitourinary: Negative for dysuria, urgency, frequency, hematuria, flank pain, vaginal bleeding, vaginal discharge, difficulty urinating, vaginal pain and pelvic pain.  Musculoskeletal: Positive for gait problem. Negative for joint swelling and arthralgias.  Skin: Negative for rash.  Neurological: Positive for weakness. Negative for dizziness, syncope, speech difficulty, numbness and headaches.  Hematological: Negative for adenopathy.  Psychiatric/Behavioral: Negative for behavioral problems, dysphoric mood and agitation. The patient is not nervous/anxious.        Objective:   Physical Exam  Constitutional: She is oriented to person, place, and time. She appears well-developed and well-nourished.  Alert. Slightly pale. No distress. Blood pressure 130/80.  HENT:  Head: Normocephalic.  Right Ear: External ear normal.  Left Ear: External ear normal.  Mouth/Throat: Oropharynx is clear and moist.  Eyes: Conjunctivae and EOM are normal. Pupils are equal, round, and reactive to light.  Neck: Normal  range of motion. Neck supple. No thyromegaly present.  Cardiovascular: Normal rate, regular rhythm, normal heart sounds and intact distal pulses.   Pulmonary/Chest: Effort normal and breath sounds normal.  Abdominal: Soft. Bowel sounds are normal. She exhibits no mass. There is no tenderness.  Musculoskeletal: Normal range of motion.  Lymphadenopathy:    She has no cervical adenopathy.  Neurological: She is alert and oriented to person, place, and time.  Requires one-person assistance to transfer from a sitting position to the examining table  Skin: Skin is warm and dry. No rash noted.  Psychiatric: She has a normal mood and affect. Her behavior is normal.          Assessment & Plan:   History microcytic anemia. We'll check a CBC and ferritin level Hypertension well controlled Fatigue. We'll check a CBC and TSH Hypothyroidism. Check a TSH Osteoarthritis stable. Refill Vicodin  Recheck 6 months or as needed

## 2012-12-30 NOTE — Patient Instructions (Signed)
Take an iron supplement daily  Return in 6 months for follow-up  Take a calcium supplement, plus [phone] units of vitamin D

## 2013-01-03 ENCOUNTER — Other Ambulatory Visit: Payer: Medicare Other

## 2013-01-06 ENCOUNTER — Other Ambulatory Visit (INDEPENDENT_AMBULATORY_CARE_PROVIDER_SITE_OTHER): Payer: Medicare Other

## 2013-01-06 DIAGNOSIS — D649 Anemia, unspecified: Secondary | ICD-10-CM

## 2013-01-06 LAB — CBC WITH DIFFERENTIAL/PLATELET
Basophils Relative: 1 % (ref 0.0–3.0)
Eosinophils Relative: 2.1 % (ref 0.0–5.0)
Lymphocytes Relative: 21.3 % (ref 12.0–46.0)
MCV: 92.6 fl (ref 78.0–100.0)
Monocytes Absolute: 0.4 10*3/uL (ref 0.1–1.0)
Monocytes Relative: 6.3 % (ref 3.0–12.0)
Neutrophils Relative %: 69.3 % (ref 43.0–77.0)
Platelets: 377 10*3/uL (ref 150.0–400.0)
RBC: 2.35 Mil/uL — ABNORMAL LOW (ref 3.87–5.11)
WBC: 5.6 10*3/uL (ref 4.5–10.5)

## 2013-04-10 ENCOUNTER — Other Ambulatory Visit: Payer: Self-pay | Admitting: Internal Medicine

## 2013-09-25 ENCOUNTER — Telehealth: Payer: Self-pay | Admitting: Internal Medicine

## 2013-09-25 MED ORDER — LISINOPRIL 20 MG PO TABS: ORAL_TABLET | ORAL | Status: DC

## 2013-09-25 MED ORDER — LEVOTHYROXINE SODIUM 75 MCG PO TABS: ORAL_TABLET | ORAL | Status: DC

## 2013-09-25 NOTE — Telephone Encounter (Signed)
[facility] requesting new script for:  lisinopril (PRINIVIL,ZESTRIL) 20 MG tablet levothyroxine (SYNTHROID, LEVOTHROID) 75 MCG tablet

## 2014-06-16 ENCOUNTER — Encounter: Payer: Self-pay | Admitting: Internal Medicine

## 2014-06-16 ENCOUNTER — Ambulatory Visit (INDEPENDENT_AMBULATORY_CARE_PROVIDER_SITE_OTHER): Payer: Commercial Managed Care - HMO | Admitting: Internal Medicine

## 2014-06-16 VITALS — BP 110/70 | HR 95 | Temp 98.0°F | Resp 18 | Ht <= 58 in | Wt 100.0 lb

## 2014-06-16 DIAGNOSIS — I10 Essential (primary) hypertension: Secondary | ICD-10-CM

## 2014-06-16 DIAGNOSIS — E039 Hypothyroidism, unspecified: Secondary | ICD-10-CM

## 2014-06-16 DIAGNOSIS — Z0000 Encounter for general adult medical examination without abnormal findings: Secondary | ICD-10-CM

## 2014-06-16 DIAGNOSIS — M150 Primary generalized (osteo)arthritis: Secondary | ICD-10-CM

## 2014-06-16 DIAGNOSIS — M159 Polyosteoarthritis, unspecified: Secondary | ICD-10-CM

## 2014-06-16 DIAGNOSIS — K219 Gastro-esophageal reflux disease without esophagitis: Secondary | ICD-10-CM

## 2014-06-16 DIAGNOSIS — Z23 Encounter for immunization: Secondary | ICD-10-CM

## 2014-06-16 NOTE — Patient Instructions (Signed)
Limit your sodium (Salt) intake  Take a calcium supplement, plus [phone] units of vitamin DHealth Maintenance Adopting a healthy lifestyle and getting preventive care can go a long way to promote health and wellness. Talk with your health care provider about what schedule of regular examinations is right for you. This is a good chance for you to check in with your provider about disease prevention and staying healthy. In between checkups, there are plenty of things you can do on your own. Experts have done a lot of research about which lifestyle changes and preventive measures are most likely to keep you healthy. Ask your health care provider for more information. WEIGHT AND DIET  Eat a healthy diet  Be sure to include plenty of vegetables, fruits, low-fat dairy products, and lean protein.  Do not eat a lot of foods high in solid fats, added sugars, or salt.  Get regular exercise. This is one of the most important things you can do for your health.  Most adults should exercise for at least 150 minutes each week. The exercise should increase your heart rate and make you sweat (moderate-intensity exercise).  Most adults should also do strengthening exercises at least twice a week. This is in addition to the moderate-intensity exercise.  Maintain a healthy weight  Body mass index (BMI) is a measurement that can be used to identify possible weight problems. It estimates body fat based on height and weight. Your health care provider can help determine your BMI and help you achieve or maintain a healthy weight.  For females 36 years of age and older:   A BMI below 18.5 is considered underweight.  A BMI of 18.5 to 24.9 is normal.  A BMI of 25 to 29.9 is considered overweight.  A BMI of 30 and above is considered obese.  Watch levels of cholesterol and blood lipids  You should start having your blood tested for lipids and cholesterol at 78 years of age, then have this test every 5  years.  You may need to have your cholesterol levels checked more often if:  Your lipid or cholesterol levels are high.  You are older than 78 years of age.  You are at high risk for heart disease.  CANCER SCREENING   Lung Cancer  Lung cancer screening is recommended for adults 88-65 years old who are at high risk for lung cancer because of a history of smoking.  A yearly low-dose CT scan of the lungs is recommended for people who:  Currently smoke.  Have quit within the past 15 years.  Have at least a 30-pack-year history of smoking. A pack year is smoking an average of one pack of cigarettes a day for 1 year.  Yearly screening should continue until it has been 15 years since you quit.  Yearly screening should stop if you develop a health problem that would prevent you from having lung cancer treatment.  Breast Cancer  Practice breast self-awareness. This means understanding how your breasts normally appear and feel.  It also means doing regular breast self-exams. Let your health care provider know about any changes, no matter how small.  If you are in your 20s or 30s, you should have a clinical breast exam (CBE) by a health care provider every 1-3 years as part of a regular health exam.  If you are 37 or older, have a CBE every year. Also consider having a breast X-ray (mammogram) every year.  If you have a family history of  breast cancer, talk to your health care provider about genetic screening.  If you are at high risk for breast cancer, talk to your health care provider about having an MRI and a mammogram every year.  Breast cancer gene (BRCA) assessment is recommended for women who have family members with BRCA-related cancers. BRCA-related cancers include:  Breast.  Ovarian.  Tubal.  Peritoneal cancers.  Results of the assessment will determine the need for genetic counseling and BRCA1 and BRCA2 testing. Cervical Cancer Routine pelvic examinations to  screen for cervical cancer are no longer recommended for nonpregnant women who are considered low risk for cancer of the pelvic organs (ovaries, uterus, and vagina) and who do not have symptoms. A pelvic examination may be necessary if you have symptoms including those associated with pelvic infections. Ask your health care provider if a screening pelvic exam is right for you.   The Pap test is the screening test for cervical cancer for women who are considered at risk.  If you had a hysterectomy for a problem that was not cancer or a condition that could lead to cancer, then you no longer need Pap tests.  If you are older than 65 years, and you have had normal Pap tests for the past 10 years, you no longer need to have Pap tests.  If you have had past treatment for cervical cancer or a condition that could lead to cancer, you need Pap tests and screening for cancer for at least 20 years after your treatment.  If you no longer get a Pap test, assess your risk factors if they change (such as having a new sexual partner). This can affect whether you should start being screened again.  Some women have medical problems that increase their chance of getting cervical cancer. If this is the case for you, your health care provider may recommend more frequent screening and Pap tests.  The human papillomavirus (HPV) test is another test that may be used for cervical cancer screening. The HPV test looks for the virus that can cause cell changes in the cervix. The cells collected during the Pap test can be tested for HPV.  The HPV test can be used to screen women 10 years of age and older. Getting tested for HPV can extend the interval between normal Pap tests from three to five years.  An HPV test also should be used to screen women of any age who have unclear Pap test results.  After 78 years of age, women should have HPV testing as often as Pap tests.  Colorectal Cancer  This type of cancer can be  detected and often prevented.  Routine colorectal cancer screening usually begins at 78 years of age and continues through 78 years of age.  Your health care provider may recommend screening at an earlier age if you have risk factors for colon cancer.  Your health care provider may also recommend using home test kits to check for hidden blood in the stool.  A small camera at the end of a tube can be used to examine your colon directly (sigmoidoscopy or colonoscopy). This is done to check for the earliest forms of colorectal cancer.  Routine screening usually begins at age 28.  Direct examination of the colon should be repeated every 5-10 years through 78 years of age. However, you may need to be screened more often if early forms of precancerous polyps or small growths are found. Skin Cancer  Check your skin from head to  toe regularly.  Tell your health care provider about any new moles or changes in moles, especially if there is a change in a mole's shape or color.  Also tell your health care provider if you have a mole that is larger than the size of a pencil eraser.  Always use sunscreen. Apply sunscreen liberally and repeatedly throughout the day.  Protect yourself by wearing long sleeves, pants, a wide-brimmed hat, and sunglasses whenever you are outside. HEART DISEASE, DIABETES, AND HIGH BLOOD PRESSURE   Have your blood pressure checked at least every 1-2 years. High blood pressure causes heart disease and increases the risk of stroke.  If you are between 13 years and 22 years old, ask your health care provider if you should take aspirin to prevent strokes.  Have regular diabetes screenings. This involves taking a blood sample to check your fasting blood sugar level.  If you are at a normal weight and have a low risk for diabetes, have this test once every three years after 78 years of age.  If you are overweight and have a high risk for diabetes, consider being tested at a  younger age or more often. PREVENTING INFECTION  Hepatitis B  If you have a higher risk for hepatitis B, you should be screened for this virus. You are considered at high risk for hepatitis B if:  You were born in a country where hepatitis B is common. Ask your health care provider which countries are considered high risk.  Your parents were born in a high-risk country, and you have not been immunized against hepatitis B (hepatitis B vaccine).  You have HIV or AIDS.  You use needles to inject street drugs.  You live with someone who has hepatitis B.  You have had sex with someone who has hepatitis B.  You get hemodialysis treatment.  You take certain medicines for conditions, including cancer, organ transplantation, and autoimmune conditions. Hepatitis C  Blood testing is recommended for:  Everyone born from [birth year] through 1963.  Anyone with known risk factors for hepatitis C. Sexually transmitted infections (STIs)  You should be screened for sexually transmitted infections (STIs) including gonorrhea and chlamydia if:  You are sexually active and are younger than 78 years of age.  You are older than 78 years of age and your health care provider tells you that you are at risk for this type of infection.  Your sexual activity has changed since you were last screened and you are at an increased risk for chlamydia or gonorrhea. Ask your health care provider if you are at risk.  If you do not have HIV, but are at risk, it may be recommended that you take a prescription medicine daily to prevent HIV infection. This is called pre-exposure prophylaxis (PrEP). You are considered at risk if:  You are sexually active and do not regularly use condoms or know the HIV status of your partner(s).  You take drugs by injection.  You are sexually active with a partner who has HIV. Talk with your health care provider about whether you are at high risk of being infected with HIV. If you choose  to begin PrEP, you should first be tested for HIV. You should then be tested every 3 months for as long as you are taking PrEP.  PREGNANCY   If you are premenopausal and you may become pregnant, ask your health care provider about preconception counseling.  If you may become pregnant, take 400 to 800 micrograms (mcg)  of folic acid every day.  If you want to prevent pregnancy, talk to your health care provider about birth control (contraception). OSTEOPOROSIS AND MENOPAUSE   Osteoporosis is a disease in which the bones lose minerals and strength with aging. This can result in serious bone fractures. Your risk for osteoporosis can be identified using a bone density scan.  If you are 81 years of age or older, or if you are at risk for osteoporosis and fractures, ask your health care provider if you should be screened.  Ask your health care provider whether you should take a calcium or vitamin D supplement to lower your risk for osteoporosis.  Menopause may have certain physical symptoms and risks.  Hormone replacement therapy may reduce some of these symptoms and risks. Talk to your health care provider about whether hormone replacement therapy is right for you.  HOME CARE INSTRUCTIONS   Schedule regular health, dental, and eye exams.  Stay current with your immunizations.   Do not use any tobacco products including cigarettes, chewing tobacco, or electronic cigarettes.  If you are pregnant, do not drink alcohol.  If you are breastfeeding, limit how much and how often you drink alcohol.  Limit alcohol intake to no more than 1 drink per day for nonpregnant women. One drink equals 12 ounces of beer, 5 ounces of wine, or 1 ounces of hard liquor.  Do not use street drugs.  Do not share needles.  Ask your health care provider for help if you need support or information about quitting drugs.  Tell your health care provider if you often feel depressed.  Tell your health care  provider if you have ever been abused or do not feel safe at home. Document Released: 03/13/2011 Document Revised: 01/12/2014 Document Reviewed: 07/30/2013 [facility] Patient Information 2015 [place], [place]. This information is not intended to replace advice given to you by your health care provider. Make sure you discuss any questions you have with your health care provider. Cardiac Diet This diet can help prevent heart disease and stroke. Many factors influence your heart health, including eating and exercise habits. Coronary risk rises a lot with abnormal blood fat (lipid) levels. Cardiac meal planning includes limiting unhealthy fats, increasing healthy fats, and making other small dietary changes. General guidelines are as follows:  Adjust calorie intake to reach and maintain desirable body weight.  Limit total fat intake to less than 30% of total calories. Saturated fat should be less than 7% of calories.  Saturated fats are found in animal products and in some vegetable products. Saturated vegetable fats are found in coconut oil, cocoa butter, palm oil, and palm kernel oil. Read labels carefully to avoid these products as much as possible. Use butter in moderation. Choose tub margarines and oils that have 2 grams of fat or less. Good cooking oils are canola and olive oils.  Practice low-fat cooking techniques. Do not fry food. Instead, broil, bake, boil, steam, grill, roast on a rack, stir-fry, or microwave it. Other fat reducing suggestions include:  Remove the skin from poultry.  Remove all visible fat from meats.  Skim the fat off stews, soups, and gravies before serving them.  Steam vegetables in water or broth instead of sauting them in fat.  Avoid foods with trans fat (or hydrogenated oils), such as commercially fried foods and commercially baked goods. Commercial shortening and deep-frying fats will contain trans fat.  Increase intake of fruits, vegetables, whole grains, and  legumes to replace foods high in  fat.  Increase consumption of nuts, legumes, and seeds to at least 4 servings weekly. One serving of a legume equals  cup, and 1 serving of nuts or seeds equals  cup.  Choose whole grains more often. Have 3 servings per day (a serving is 1 ounce [oz]).  Eat 4 to 5 servings of vegetables per day. A serving of vegetables is 1 cup of raw leafy vegetables;  cup of raw or cooked cut-up vegetables;  cup of vegetable juice.  Eat 4 to 5 servings of fruit per day. A serving of fruit is 1 medium whole fruit;  cup of dried fruit;  cup of fresh, frozen, or canned fruit;  cup of 100% fruit juice.  Increase your intake of dietary fiber to 20 to 30 grams per day. Insoluble fiber may help lower your risk of heart disease and may help curb your appetite.  Soluble fiber binds cholesterol to be removed from the blood. Foods high in soluble fiber are dried beans, citrus fruits, oats, apples, bananas, broccoli, Brussels sprouts, and eggplant.  Try to include foods fortified with plant sterols or stanols, such as yogurt, breads, juices, or margarines. Choose several fortified foods to achieve a daily intake of 2 to 3 grams of plant sterols or stanols.  Foods with omega-3 fats can help reduce your risk of heart disease. Aim to have a 3.5 oz portion of fatty fish twice per week, such as salmon, mackerel, albacore tuna, sardines, lake trout, or herring. If you wish to take a fish oil supplement, choose one that contains 1 gram of both DHA and EPA.  Limit processed meats to 2 servings (3 oz portion) weekly.  Limit the sodium in your diet to 1500 milligrams (mg) per day. If you have high blood pressure, talk to a registered dietitian about a DASH (Dietary Approaches to Stop Hypertension) eating plan.  Limit sweets and beverages with added sugar, such as soda, to no more than 5 servings per week. One serving is:   1 tablespoon sugar.  1 tablespoon jelly or jam.   cup  sorbet.  1 cup lemonade.   cup regular soda. CHOOSING FOODS Starches  Allowed: Breads: All kinds (wheat, rye, raisin, white, oatmeal, [place], [place], and English muffin bread). Low-fat rolls: English muffins, frankfurter and hamburger buns, bagels, pita bread, tortillas (not fried). Pancakes, waffles, biscuits, and muffins made with recommended oil.  Avoid: Products made with saturated or trans fats, oils, or whole milk products. Butter rolls, cheese breads, croissants. Commercial doughnuts, muffins, sweet rolls, biscuits, waffles, pancakes, store-bought mixes. Crackers  Allowed: Low-fat crackers and snacks: Animal, graham, rye, saltine (with recommended oil, no lard), oyster, and matzo crackers. Bread sticks, melba toast, rusks, flatbread, pretzels, and light popcorn.  Avoid: High-fat crackers: cheese crackers, butter crackers, and those made with coconut, palm oil, or trans fat (hydrogenated oils). Buttered popcorn. Cereals  Allowed: Hot or cold whole-grain cereals.  Avoid: Cereals containing coconut, hydrogenated vegetable fat, or animal fat. Potatoes / Pasta / Rice  Allowed: All kinds of potatoes, rice, and pasta (such as macaroni, spaghetti, and noodles).  Avoid: Pasta or rice prepared with cream sauce or high-fat cheese. Chow mein noodles, [place] fries. Vegetables  Allowed: All vegetables and vegetable juices.  Avoid: Fried vegetables. Vegetables in cream, butter, or high-fat cheese sauces. Limit coconut. Fruit in cream or custard. Protein  Allowed: Limit your intake of meat, seafood, and poultry to no more than 6 oz (cooked weight) per day. All lean, well-trimmed beef, veal, pork,  and lamb. All chicken and [place] without skin. All fish and shellfish. Wild game: wild duck, rabbit, pheasant, and venison. Egg whites or low-cholesterol egg substitutes may be used as desired. Meatless dishes: recipes with dried beans, peas, lentils, and tofu (soybean curd). Seeds and nuts: all  seeds and most nuts.  Avoid: Prime grade and other heavily marbled and fatty meats, such as short ribs, spare ribs, rib eye roast or steak, frankfurters, sausage, bacon, and high-fat luncheon meats, mutton. Caviar. Commercially fried fish. Domestic duck, goose, venison sausage. Organ meats: liver, gizzard, heart, chitterlings, brains, kidney, sweetbreads. Dairy  Allowed: Low-fat cheeses: nonfat or low-fat cottage cheese (1% or 2% fat), cheeses made with part skim milk, such as mozzarella, farmers, string, or ricotta. (Cheeses should be labeled no more than 2 to 6 grams fat per oz.). Skim (or 1%) milk: liquid, powdered, or evaporated. Buttermilk made with low-fat milk. Drinks made with skim or low-fat milk or cocoa. Chocolate milk or cocoa made with skim or low-fat (1%) milk. Nonfat or low-fat yogurt.  Avoid: Whole milk cheeses, including colby, cheddar, [place], Monterey Jack, [place], [place], [place], American, Swiss, and blue. Creamed cottage cheese, cream cheese. Whole milk and whole milk products, including buttermilk or yogurt made from whole milk, drinks made from whole milk. Condensed milk, evaporated whole milk, and 2% milk. Soups and Combination Foods  Allowed: Low-fat low-sodium soups: broth, dehydrated soups, homemade broth, soups with the fat removed, homemade cream soups made with skim or low-fat milk. Low-fat spaghetti, lasagna, chili, and Spanish rice if low-fat ingredients and low-fat cooking techniques are used.  Avoid: Cream soups made with whole milk, cream, or high-fat cheese. All other soups. Desserts and Sweets  Allowed: Sherbet, fruit ices, gelatins, meringues, and angel food cake. Homemade desserts with recommended fats, oils, and milk products. Jam, jelly, honey, marmalade, sugars, and syrups. Pure sugar candy, such as gum drops, hard candy, jelly beans, marshmallows, mints, and small amounts of dark chocolate.  Avoid: Commercially prepared cakes, pies, cookies, frosting,  pudding, or mixes for these products. Desserts containing whole milk products, chocolate, coconut, lard, palm oil, or palm kernel oil. Ice cream or ice cream drinks. Candy that contains chocolate, coconut, butter, hydrogenated fat, or unknown ingredients. Buttered syrups. Fats and Oils  Allowed: Vegetable oils: safflower, sunflower, corn, soybean, cottonseed, sesame, canola, olive, or peanut. Non-hydrogenated margarines. Salad dressing or mayonnaise: homemade or commercial, made with a recommended oil. Low or nonfat salad dressing or mayonnaise.  Limit added fats and oils to 6 to 8 tsp per day (includes fats used in cooking, baking, salads, and spreads on bread). Remember to count the "hidden fats" in foods.  Avoid: Solid fats and shortenings: butter, lard, salt pork, bacon drippings. Gravy containing meat fat, shortening, or suet. Cocoa butter, coconut. Coconut oil, palm oil, palm kernel oil, or hydrogenated oils: these ingredients are often used in bakery products, nondairy creamers, whipped toppings, candy, and commercially fried foods. Read labels carefully. Salad dressings made of unknown oils, sour cream, or cheese, such as blue cheese and Roquefort. Cream, all kinds: half-and-half, light, heavy, or whipping. Sour cream or cream cheese (even if "light" or low-fat). Nondairy cream substitutes: coffee creamers and sour cream substitutes made with palm, palm kernel, hydrogenated oils, or coconut oil. Beverages  Allowed: Coffee (regular or decaffeinated), tea. Diet carbonated beverages, mineral water. Alcohol: Check with your caregiver. Moderation is recommended.  Avoid: Whole milk, regular sodas, and juice drinks with added sugar. Condiments  Allowed: All seasonings and condiments. Cocoa  powder. "Cream" sauces made with recommended ingredients.  Avoid: Carob powder made with hydrogenated fats. SAMPLE MENU Breakfast   cup orange juice   cup oatmeal  1 slice toast  1 tsp margarine  1  cup skim milk Lunch  [place] sandwich with 2 oz [place], 2 slices bread  Lettuce and tomato slices  Fresh fruit  Carrot sticks  Coffee or tea Snack  Fresh fruit or low-fat crackers Dinner  3 oz lean ground beef  1 baked potato  1 tsp margarine   cup asparagus  Lettuce salad  1 tbs non-creamy dressing   cup peach slices  1 cup skim milk Document Released: 06/06/2008 Document Revised: 02/27/2012 Document Reviewed: 10/28/2013 [employer] Patient Information 2015 [place], [place]. This information is not intended to replace advice given to you by your health care provider. Make sure you discuss any questions you have with your health care provider.

## 2014-06-16 NOTE — Progress Notes (Signed)
Subjective:    Patient ID: Katherine Flynn, female    DOB: [birth year], 80 y.o.   MRN: [id number]  HPI [age]year-old patient who is seen today for an annual examination.  She has a history of osteoarthritis. She has had right total hip replacement surgery x2. .  She has treated hypertension which has been stable she has COPD hypothyroidism. She is accompanied by her son. She has a history of weight loss but this has been stable.   Past Medical History  Diagnosis Date  . GERD (gastroesophageal reflux disease)   . Hypertension   . Thyroid disease   . COPD (chronic obstructive pulmonary disease)   . Osteoarthritis   . Weight loss   . History of pyloric stenosis   . Chronic pain syndrome   . Macular degeneration     History   Social History  . Marital Status: Widowed    Spouse Name: N/A    Number of Children: N/A  . Years of Education: N/A   Occupational History  . Not on file.   Social History Main Topics  . Smoking status: Never Smoker   . Smokeless tobacco: Never Used  . Alcohol Use: No  . Drug Use: No  . Sexual Activity: Not on file   Other Topics Concern  . Not on file   Social History Narrative  . No narrative on file    Past Surgical History  Procedure Laterality Date  . Appendectomy    . Cataract extraction    . Orif hip fracture    . Tubal ligation    . Nissan fundoplication  08/2007    History reviewed. No pertinent family history.  No Known Allergies  Current Outpatient Prescriptions on File Prior to Visit  Medication Sig Dispense Refill  . levothyroxine (SYNTHROID, LEVOTHROID) 75 MCG tablet TAKE ONE TABLET BY MOUTH EVERY DAY  90 tablet  1  . lisinopril (PRINIVIL,ZESTRIL) 20 MG tablet TAKE ONE TABLET BY MOUTH EVERY DAY  90 tablet  1  . Multiple Vitamins-Minerals (PRESERVISION AREDS PO) Take 1 capsule by mouth daily.      . Omega-3 Fatty Acids (FISH OIL) 300 MG CAPS Take by mouth daily.         No current facility-administered medications on  file prior to visit.    BP 110/70  Pulse 95  Temp(Src) 98 F (36.7 C) (Oral)  Resp 18  Ht 4\' 10"  (1.473 m)  Wt 100 lb (45.36 kg)  BMI 20.91 kg/m2  SpO2 95%   1. Risk factors, based on past  M,S,F history-  vascular risk factors include age, and a history of hypertension  2.  Physical activities: Sedentary due to age and arthritis  3.  Depression/mood: No history of severe depression or mood disorder  4.  Hearing: Minimal deficits  5.  ADL's: Independent in most aspects of daily living, but does require assistance  6.  Fall risk:High due  to age arthritis, and unsteady gait.  Walks with a cane  7.  Home safety: No problems identified  8.  Height weight, and visual acuity; height and weight stable.  No change in visual  9.  Counseling: Heart healthy diet recommended  10. Lab orders based on risk factors: Laboratory update including TSH.  Will be reviewed  11. Referral : Ophthalmology referral.  Recommended  12. Care plan: Continue present medication and heart healthy diet.  Check screening lab  13. Cognitive assessment: Alert and oriented with normal affect.  No cognitive dysfunction  14. Screening: Annual preventive examination recommended with screening lab.  Annual eye examination encouraged  15. Provider List Update: Includes primary care and ophthalmology     Review of Systems  Constitutional: Negative.   HENT: Negative for congestion, dental problem, hearing loss, rhinorrhea, sinus pressure, sore throat and tinnitus.   Eyes: Negative for pain, discharge and visual disturbance.  Respiratory: Negative for cough and shortness of breath.   Cardiovascular: Negative for chest pain, palpitations and leg swelling.  Gastrointestinal: Negative for nausea, vomiting, abdominal pain, diarrhea, constipation, blood in stool and abdominal distention.  Genitourinary: Negative for dysuria, urgency, frequency, hematuria, flank pain, vaginal bleeding, vaginal discharge,  difficulty urinating, vaginal pain and pelvic pain.  Musculoskeletal: Negative for arthralgias, gait problem and joint swelling.  Skin: Negative for rash.  Neurological: Negative for dizziness, syncope, speech difficulty, weakness, numbness and headaches.  Hematological: Negative for adenopathy.  Psychiatric/Behavioral: Negative for behavioral problems, dysphoric mood and agitation. The patient is not nervous/anxious.        Objective:   Physical Exam  Constitutional: She is oriented to person, place, and time. She appears well-developed and well-nourished.  Elderly frail no distress. Walks with a cane. Blood pressure 112/76  HENT:  Head: Normocephalic.  Right Ear: External ear normal.  Left Ear: External ear normal.  Mouth/Throat: Oropharynx is clear and moist.  Edentulous  Eyes: Conjunctivae and EOM are normal. Pupils are equal, round, and reactive to light.  Neck: Normal range of motion. Neck supple. No thyromegaly present.  Cardiovascular: Normal rate, regular rhythm and normal heart sounds.   Dorsalis pedis pulses full.  Posterior tibial pulses not easily palpable  Pulmonary/Chest: Effort normal and breath sounds normal.  Abdominal: Soft. Bowel sounds are normal. She exhibits no mass. There is no tenderness.  Multiple well-healed surgical scars  Musculoskeletal: Normal range of motion.  Bilateral hallux valgus deformities  Lymphadenopathy:    She has no cervical adenopathy.  Neurological: She is alert and oriented to person, place, and time.  Skin: Skin is warm and dry. No rash noted.  Psychiatric: She has a normal mood and affect. Her behavior is normal.          Assessment & Plan:   Preventive health examination Osteoarthritis Hypertension well controlled Hypothyroidism. Gastroesophageal reflux disease  Laboratory update will be reviewed Prevnar 13 and flu vaccine administered Recheck in one year  Medications updated

## 2014-06-16 NOTE — Progress Notes (Signed)
Pre visit review using our clinic review tool, if applicable. No additional management support is needed unless otherwise documented below in the visit note. 

## 2014-06-16 NOTE — Progress Notes (Signed)
   Subjective:    Patient ID: Katherine Flynn, female    DOB: 1923-07-11, 80 y.o.   MRN: [id number]  HPI Wt Readings from Last 3 Encounters:  06/16/14 100 lb (45.36 kg)  12/30/12 98 lb (44.453 kg)  08/31/11 97 lb (43.999 kg)   See above  Review of Systems    see above Objective:   Physical Exam  See above      Assessment & Plan:  See above

## 2014-06-17 ENCOUNTER — Telehealth: Payer: Self-pay | Admitting: Internal Medicine

## 2014-06-17 NOTE — Telephone Encounter (Signed)
emmi mailed  °

## 2014-07-13 ENCOUNTER — Encounter: Payer: Self-pay | Admitting: Internal Medicine

## 2014-10-26 ENCOUNTER — Encounter: Payer: Self-pay | Admitting: Internal Medicine

## 2014-10-26 ENCOUNTER — Ambulatory Visit (INDEPENDENT_AMBULATORY_CARE_PROVIDER_SITE_OTHER): Payer: Commercial Managed Care - HMO | Admitting: Internal Medicine

## 2014-10-26 VITALS — BP 160/84 | HR 96 | Temp 98.2°F | Resp 18 | Ht <= 58 in | Wt 100.0 lb

## 2014-10-26 DIAGNOSIS — K219 Gastro-esophageal reflux disease without esophagitis: Secondary | ICD-10-CM | POA: Diagnosis not present

## 2014-10-26 DIAGNOSIS — R634 Abnormal weight loss: Secondary | ICD-10-CM

## 2014-10-26 DIAGNOSIS — E039 Hypothyroidism, unspecified: Secondary | ICD-10-CM

## 2014-10-26 DIAGNOSIS — I10 Essential (primary) hypertension: Secondary | ICD-10-CM | POA: Diagnosis not present

## 2014-10-26 DIAGNOSIS — D509 Iron deficiency anemia, unspecified: Secondary | ICD-10-CM

## 2014-10-26 LAB — CBC WITH DIFFERENTIAL/PLATELET
Basophils Absolute: 0.1 10*3/uL (ref 0.0–0.1)
Basophils Relative: 0.9 % (ref 0.0–3.0)
EOS ABS: 0.2 10*3/uL (ref 0.0–0.7)
Eosinophils Relative: 2 % (ref 0.0–5.0)
HCT: 33.6 % — ABNORMAL LOW (ref 36.0–46.0)
Hemoglobin: 11 g/dL — ABNORMAL LOW (ref 12.0–15.0)
LYMPHS ABS: 1.3 10*3/uL (ref 0.7–4.0)
LYMPHS PCT: 15.9 % (ref 12.0–46.0)
MCHC: 32.8 g/dL (ref 30.0–36.0)
MCV: 89.2 fl (ref 78.0–100.0)
Monocytes Absolute: 0.4 10*3/uL (ref 0.1–1.0)
Monocytes Relative: 5.1 % (ref 3.0–12.0)
NEUTROS PCT: 76.1 % (ref 43.0–77.0)
Neutro Abs: 6.2 10*3/uL (ref 1.4–7.7)
Platelets: 362 10*3/uL (ref 150.0–400.0)
RBC: 3.77 Mil/uL — AB (ref 3.87–5.11)
RDW: 14.5 % (ref 11.5–15.5)
WBC: 8.2 10*3/uL (ref 4.0–10.5)

## 2014-10-26 LAB — COMPREHENSIVE METABOLIC PANEL
ALT: 12 U/L (ref 0–35)
AST: 22 U/L (ref 0–37)
Albumin: 4.1 g/dL (ref 3.5–5.2)
Alkaline Phosphatase: 80 U/L (ref 39–117)
BUN: 15 mg/dL (ref 6–23)
CO2: 32 meq/L (ref 19–32)
CREATININE: 0.87 mg/dL (ref 0.40–1.20)
Calcium: 9.7 mg/dL (ref 8.4–10.5)
Chloride: 103 mEq/L (ref 96–112)
GFR: 65.12 mL/min (ref >60.00)
Glucose, Bld: 106 mg/dL — ABNORMAL HIGH (ref 70–99)
POTASSIUM: 3.1 meq/L — AB (ref 3.5–5.1)
SODIUM: 141 meq/L (ref 135–145)
TOTAL PROTEIN: 7.3 g/dL (ref 6.0–8.3)
Total Bilirubin: 0.3 mg/dL (ref 0.2–1.2)

## 2014-10-26 LAB — TSH: TSH: 59.01 u[IU]/mL — ABNORMAL HIGH (ref 0.35–4.50)

## 2014-10-26 LAB — VITAMIN B12: VITAMIN B 12: 531 pg/mL (ref 211–911)

## 2014-10-26 LAB — SEDIMENTATION RATE: Sed Rate: 50 mm/hr — ABNORMAL HIGH (ref 0–22)

## 2014-10-26 MED ORDER — LISINOPRIL 20 MG PO TABS: ORAL_TABLET | ORAL | Status: DC

## 2014-10-26 MED ORDER — LEVOTHYROXINE SODIUM 75 MCG PO TABS: ORAL_TABLET | ORAL | Status: DC

## 2014-10-26 NOTE — Progress Notes (Signed)
Subjective:    Patient ID: Katherine Flynn, female    DOB: November 08, 1922, 81 y.o.   MRN: [id number]  HPI 81 year old patient who is seen today in follow-up.  Today she is accompanied by her oldest son.  Apparently she has been off all medications for a couple of months.  Complaints today include diminished memory over the past 6 weeks area and she also complains of a painful right hand and balance difficulties over the past 6 months.  She has a history of severe iron deficiency anemia but has declined the GI referral in the past.  She was seen here in October but failed to have lab studies performed at that time.  Her last hemoglobin was 7.1 almost 2 years ago.  She has been off all her medications including levothyroxine for the past 2 months.  The son is requesting forms to be completed to try to get [facility]  MMSE  22/30  Past Medical History  Diagnosis Date  . GERD (gastroesophageal reflux disease)   . Hypertension   . Thyroid disease   . COPD (chronic obstructive pulmonary disease)   . Osteoarthritis   . Weight loss   . History of pyloric stenosis   . Chronic pain syndrome   . Macular degeneration     History   Social History  . Marital Status: Widowed    Spouse Name: N/A  . Number of Children: N/A  . Years of Education: N/A   Occupational History  . Not on file.   Social History Main Topics  . Smoking status: Never Smoker   . Smokeless tobacco: Never Used  . Alcohol Use: No  . Drug Use: No  . Sexual Activity: Not on file   Other Topics Concern  . Not on file   Social History Narrative    Past Surgical History  Procedure Laterality Date  . Appendectomy    . Cataract extraction    . Orif hip fracture    . Tubal ligation    . Nissan fundoplication  08/2007    No family history on file.  No Known Allergies  Current Outpatient Prescriptions on File Prior to Visit  Medication Sig Dispense Refill  . acetaminophen  (PAIN RELIEF) 500 MG tablet Take 500 mg by mouth every 6 (six) hours as needed.    Marland Kitchen. levothyroxine (SYNTHROID, LEVOTHROID) 75 MCG tablet TAKE ONE TABLET BY MOUTH EVERY DAY 90 tablet 1  . lisinopril (PRINIVIL,ZESTRIL) 20 MG tablet TAKE ONE TABLET BY MOUTH EVERY DAY 90 tablet 1  . Multiple Vitamins-Minerals (CENTRUM SILVER PO) Take 1 tablet by mouth daily.     No current facility-administered medications on file prior to visit.    BP 160/84 mmHg  Pulse 96  Temp(Src) 98.2 F (36.8 C) (Oral)  Resp 18  Ht 4\' 10"  (1.473 m)  Wt 100 lb (45.36 kg)  BMI 20.91 kg/m2  SpO2 98%    Review of Systems  Constitutional: Positive for fatigue.  HENT: Negative for congestion, dental problem, hearing loss, rhinorrhea, sinus pressure, sore throat and tinnitus.   Eyes: Negative for pain, discharge and visual disturbance.  Respiratory: Negative for cough and shortness of breath.   Cardiovascular: Negative for chest pain, palpitations and leg swelling.  Gastrointestinal: Negative for nausea, vomiting, abdominal pain, diarrhea, constipation, blood in stool and abdominal distention.  Genitourinary: Negative for dysuria, urgency, frequency, hematuria, flank pain, vaginal bleeding, vaginal discharge, difficulty urinating, vaginal pain and pelvic pain.  Musculoskeletal: Positive for back pain, gait problem and neck pain. Negative for joint swelling and arthralgias.       Right hand pain  Skin: Negative for rash.  Neurological: Negative for dizziness, syncope, speech difficulty, weakness, numbness and headaches.  Hematological: Negative for adenopathy.  Psychiatric/Behavioral: Positive for confusion and decreased concentration. Negative for behavioral problems, dysphoric mood and agitation. The patient is not nervous/anxious.        Objective:   Physical Exam  Constitutional: She is oriented to person, place, and time. She appears well-developed and well-nourished.  Alert elderly, frail Blood pressure  140/90   HENT:  Head: Normocephalic.  Right Ear: External ear normal.  Left Ear: External ear normal.  Mouth/Throat: Oropharynx is clear and moist.  Eyes: Conjunctivae and EOM are normal. Pupils are equal, round, and reactive to light.  Neck: Normal range of motion. Neck supple. No thyromegaly present.  Cardiovascular: Normal rate, regular rhythm, normal heart sounds and intact distal pulses.   Pulmonary/Chest: Effort normal and breath sounds normal.  Abdominal: Soft. Bowel sounds are normal. She exhibits no mass. There is no tenderness.  Musculoskeletal: Normal range of motion.  Osteoarthritic joint changes  Lymphadenopathy:    She has no cervical adenopathy.  Neurological: She is alert and oriented to person, place, and time.  Skin: Skin is warm and dry. No rash noted.  Pale  Psychiatric: She has a normal mood and affect. Her behavior is normal.          Assessment & Plan:   Mild to moderate cognitive impairment Untreated hypothyroidism Essential hypertension, untreated Unsteady gait History of severe iron deficiency anemia  We'll check laboratory update including CBC and TSH Forms for assistance with the [facility] system.  Completed  Recheck one month  Iron supplements twice daily.  Encouraged

## 2014-10-26 NOTE — Patient Instructions (Addendum)
Take all medications as prescribed  Take an iron supplement twice daily  Return in one month for follow-up  Levothyroxine- take 1 half tablet every other day for 1 week, then 1 half tablet daily for one week, then 1 tablet daily

## 2014-10-26 NOTE — Progress Notes (Signed)
Pre visit review using our clinic review tool, if applicable. No additional management support is needed unless otherwise documented below in the visit note. 

## 2014-10-27 ENCOUNTER — Other Ambulatory Visit: Payer: Self-pay | Admitting: Internal Medicine

## 2014-10-27 DIAGNOSIS — E039 Hypothyroidism, unspecified: Secondary | ICD-10-CM

## 2014-11-23 ENCOUNTER — Ambulatory Visit: Payer: Commercial Managed Care - HMO | Admitting: Internal Medicine

## 2014-11-24 ENCOUNTER — Ambulatory Visit (INDEPENDENT_AMBULATORY_CARE_PROVIDER_SITE_OTHER): Payer: Commercial Managed Care - HMO | Admitting: Internal Medicine

## 2014-11-24 ENCOUNTER — Encounter: Payer: Self-pay | Admitting: Internal Medicine

## 2014-11-24 VITALS — BP 150/90 | HR 102 | Temp 98.4°F | Resp 18 | Ht <= 58 in | Wt 94.0 lb

## 2014-11-24 DIAGNOSIS — M150 Primary generalized (osteo)arthritis: Secondary | ICD-10-CM

## 2014-11-24 DIAGNOSIS — I10 Essential (primary) hypertension: Secondary | ICD-10-CM | POA: Diagnosis not present

## 2014-11-24 DIAGNOSIS — M159 Polyosteoarthritis, unspecified: Secondary | ICD-10-CM

## 2014-11-24 DIAGNOSIS — E039 Hypothyroidism, unspecified: Secondary | ICD-10-CM | POA: Diagnosis not present

## 2014-11-24 DIAGNOSIS — E876 Hypokalemia: Secondary | ICD-10-CM | POA: Diagnosis not present

## 2014-11-24 LAB — BASIC METABOLIC PANEL
BUN: 15 mg/dL (ref 6–23)
CO2: 32 meq/L (ref 19–32)
Calcium: 10.2 mg/dL (ref 8.4–10.5)
Chloride: 103 mEq/L (ref 96–112)
Creatinine, Ser: 0.78 mg/dL (ref 0.40–1.20)
GFR: 73.85 mL/min (ref >60.00)
Glucose, Bld: 112 mg/dL — ABNORMAL HIGH (ref 70–99)
Potassium: 3.6 mEq/L (ref 3.5–5.1)
SODIUM: 140 meq/L (ref 135–145)

## 2014-11-24 LAB — TSH: TSH: 24.87 u[IU]/mL — AB (ref 0.35–4.50)

## 2014-11-24 NOTE — Progress Notes (Signed)
Subjective:    Patient ID: Katherine Flynn, female    DOB: October 11, 1922, 81 y.o.   MRN: [id number]  HPI 81 year old patient seen today in follow-up.  She was seen 1 month ago after the patient self discontinued all medications.  TSH was predictably elevated and she now is back on levothyroxine supplementation.  She has hypertension and has been resumed on lisinopril.  Patient has done much better.  She has a history of iron deficiency anemia.  CBC 1 month ago was greatly improved.  No new concerns or complaints.  Patient does have some arthritic pain, but in general has done much better with treatment of hypothyroidism  Past Medical History  Diagnosis Date  . GERD (gastroesophageal reflux disease)   . Hypertension   . Thyroid disease   . COPD (chronic obstructive pulmonary disease)   . Osteoarthritis   . Weight loss   . History of pyloric stenosis   . Chronic pain syndrome   . Macular degeneration     History   Social History  . Marital Status: Widowed    Spouse Name: N/A  . Number of Children: N/A  . Years of Education: N/A   Occupational History  . Not on file.   Social History Main Topics  . Smoking status: Never Smoker   . Smokeless tobacco: Never Used  . Alcohol Use: No  . Drug Use: No  . Sexual Activity: Not on file   Other Topics Concern  . Not on file   Social History Narrative    Past Surgical History  Procedure Laterality Date  . Appendectomy    . Cataract extraction    . Orif hip fracture    . Tubal ligation    . Nissan fundoplication  08/2007    No family history on file.  No Known Allergies  Current Outpatient Prescriptions on File Prior to Visit  Medication Sig Dispense Refill  . acetaminophen (PAIN RELIEF) 500 MG tablet Take 500 mg by mouth every 6 (six) hours as needed.    Marland Kitchen. levothyroxine (SYNTHROID, LEVOTHROID) 75 MCG tablet TAKE ONE TABLET BY MOUTH EVERY DAY 90 tablet 1  . lisinopril (PRINIVIL,ZESTRIL) 20 MG tablet TAKE ONE TABLET BY  MOUTH EVERY DAY 90 tablet 1  . Multiple Vitamins-Minerals (CENTRUM SILVER PO) Take 1 tablet by mouth daily.     No current facility-administered medications on file prior to visit.    BP 150/90 mmHg  Pulse 102  Temp(Src) 98.4 F (36.9 C) (Oral)  Resp 18  Ht 4\' 10"  (1.473 m)  Wt 94 lb (42.638 kg)  BMI 19.65 kg/m2  SpO2 96%      Review of Systems  Constitutional: Negative.   HENT: Negative for congestion, dental problem, hearing loss, rhinorrhea, sinus pressure, sore throat and tinnitus.   Eyes: Negative for pain, discharge and visual disturbance.  Respiratory: Negative for cough and shortness of breath.   Cardiovascular: Negative for chest pain, palpitations and leg swelling.  Gastrointestinal: Negative for nausea, vomiting, abdominal pain, diarrhea, constipation, blood in stool and abdominal distention.  Genitourinary: Negative for dysuria, urgency, frequency, hematuria, flank pain, vaginal bleeding, vaginal discharge, difficulty urinating, vaginal pain and pelvic pain.  Musculoskeletal: Positive for back pain, arthralgias and gait problem. Negative for joint swelling.  Skin: Negative for rash.  Neurological: Negative for dizziness, syncope, speech difficulty, weakness, numbness and headaches.  Hematological: Negative for adenopathy.  Psychiatric/Behavioral: Negative for behavioral problems, dysphoric mood and agitation. The patient is not nervous/anxious.  Objective:   Physical Exam  Constitutional: She is oriented to person, place, and time. She appears well-developed and well-nourished.  Repeat blood pressure 140/80  HENT:  Head: Normocephalic.  Right Ear: External ear normal.  Left Ear: External ear normal.  Mouth/Throat: Oropharynx is clear and moist.  Eyes: Conjunctivae and EOM are normal. Pupils are equal, round, and reactive to light.  Neck: Normal range of motion. Neck supple. No thyromegaly present.  Cardiovascular: Normal rate, regular rhythm, normal  heart sounds and intact distal pulses.   Pulmonary/Chest: Effort normal and breath sounds normal.  Abdominal: Soft. Bowel sounds are normal. She exhibits no mass. There is no tenderness.  Musculoskeletal: Normal range of motion.  Lymphadenopathy:    She has no cervical adenopathy.  Neurological: She is alert and oriented to person, place, and time.  Skin: Skin is warm and dry. No rash noted.  Psychiatric: She has a normal mood and affect. Her behavior is normal.          Assessment & Plan:   Hypothyroidism.  Continue levothyroxine treatment.  Check TSH Essential hypertension, stable.  Continue lisinopril Osteoarthritis.  Continue Tylenol when necessary History of iron deficiency anemia.  Stable  Recheck 3 months

## 2014-11-24 NOTE — Patient Instructions (Signed)
Limit your sodium (Salt) intake  Return in 3 months for follow-up   

## 2014-11-24 NOTE — Progress Notes (Signed)
Pre visit review using our clinic review tool, if applicable. No additional management support is needed unless otherwise documented below in the visit note. 

## 2015-02-10 ENCOUNTER — Emergency Department (HOSPITAL_COMMUNITY): Payer: Commercial Managed Care - HMO

## 2015-02-10 ENCOUNTER — Emergency Department (HOSPITAL_COMMUNITY)
Admission: EM | Admit: 2015-02-10 | Discharge: 2015-02-11 | Disposition: A | Discharge status: Home / Self Care | Payer: Commercial Managed Care - HMO | Attending: Emergency Medicine | Admitting: Emergency Medicine

## 2015-02-10 ENCOUNTER — Encounter (HOSPITAL_COMMUNITY): Payer: Self-pay | Admitting: Emergency Medicine

## 2015-02-10 DIAGNOSIS — I10 Essential (primary) hypertension: Secondary | ICD-10-CM | POA: Insufficient documentation

## 2015-02-10 DIAGNOSIS — Z23 Encounter for immunization: Secondary | ICD-10-CM | POA: Insufficient documentation

## 2015-02-10 DIAGNOSIS — Y92009 Unspecified place in unspecified non-institutional (private) residence as the place of occurrence of the external cause: Secondary | ICD-10-CM | POA: Insufficient documentation

## 2015-02-10 DIAGNOSIS — S0003XA Contusion of scalp, initial encounter: Secondary | ICD-10-CM | POA: Diagnosis not present

## 2015-02-10 DIAGNOSIS — S0990XA Unspecified injury of head, initial encounter: Secondary | ICD-10-CM | POA: Diagnosis present

## 2015-02-10 DIAGNOSIS — E079 Disorder of thyroid, unspecified: Secondary | ICD-10-CM | POA: Diagnosis not present

## 2015-02-10 DIAGNOSIS — T148 Other injury of unspecified body region: Secondary | ICD-10-CM | POA: Diagnosis not present

## 2015-02-10 DIAGNOSIS — Y999 Unspecified external cause status: Secondary | ICD-10-CM | POA: Insufficient documentation

## 2015-02-10 DIAGNOSIS — G894 Chronic pain syndrome: Secondary | ICD-10-CM | POA: Diagnosis not present

## 2015-02-10 DIAGNOSIS — Y939 Activity, unspecified: Secondary | ICD-10-CM | POA: Diagnosis not present

## 2015-02-10 DIAGNOSIS — Z8719 Personal history of other diseases of the digestive system: Secondary | ICD-10-CM | POA: Insufficient documentation

## 2015-02-10 DIAGNOSIS — R55 Syncope and collapse: Secondary | ICD-10-CM | POA: Diagnosis not present

## 2015-02-10 DIAGNOSIS — Z79899 Other long term (current) drug therapy: Secondary | ICD-10-CM | POA: Insufficient documentation

## 2015-02-10 DIAGNOSIS — Z8739 Personal history of other diseases of the musculoskeletal system and connective tissue: Secondary | ICD-10-CM | POA: Insufficient documentation

## 2015-02-10 DIAGNOSIS — N390 Urinary tract infection, site not specified: Secondary | ICD-10-CM | POA: Diagnosis not present

## 2015-02-10 DIAGNOSIS — S0190XA Unspecified open wound of unspecified part of head, initial encounter: Secondary | ICD-10-CM | POA: Diagnosis not present

## 2015-02-10 DIAGNOSIS — S0101XA Laceration without foreign body of scalp, initial encounter: Secondary | ICD-10-CM | POA: Diagnosis not present

## 2015-02-10 DIAGNOSIS — W01198A Fall on same level from slipping, tripping and stumbling with subsequent striking against other object, initial encounter: Secondary | ICD-10-CM | POA: Insufficient documentation

## 2015-02-10 DIAGNOSIS — J449 Chronic obstructive pulmonary disease, unspecified: Secondary | ICD-10-CM | POA: Insufficient documentation

## 2015-02-10 DIAGNOSIS — S199XXA Unspecified injury of neck, initial encounter: Secondary | ICD-10-CM | POA: Diagnosis not present

## 2015-02-10 LAB — CBC WITH DIFFERENTIAL/PLATELET
Basophils Absolute: 0 10*3/uL (ref 0.0–0.1)
Basophils Relative: 0 % (ref 0–1)
EOS ABS: 0.3 10*3/uL (ref 0.0–0.7)
EOS PCT: 3 % (ref 0–5)
HEMATOCRIT: 36.6 % (ref 36.0–46.0)
Hemoglobin: 12.1 g/dL (ref 12.0–15.0)
LYMPHS ABS: 1.2 10*3/uL (ref 0.7–4.0)
Lymphocytes Relative: 12 % (ref 12–46)
MCH: 28.9 pg (ref 26.0–34.0)
MCHC: 33.1 g/dL (ref 30.0–36.0)
MCV: 87.4 fL (ref 78.0–100.0)
Monocytes Absolute: 0.7 10*3/uL (ref 0.1–1.0)
Monocytes Relative: 7 % (ref 3–12)
NEUTROS ABS: 7.8 10*3/uL — AB (ref 1.7–7.7)
NEUTROS PCT: 78 % — AB (ref 43–77)
Platelets: 298 10*3/uL (ref 150–400)
RBC: 4.19 MIL/uL (ref 3.87–5.11)
RDW: 14.3 % (ref 11.5–15.5)
WBC: 9.9 10*3/uL (ref 4.0–10.5)

## 2015-02-10 LAB — URINALYSIS, ROUTINE W REFLEX MICROSCOPIC
BILIRUBIN URINE: NEGATIVE
GLUCOSE, UA: NEGATIVE mg/dL
KETONES UR: NEGATIVE mg/dL
Nitrite: POSITIVE — AB
PROTEIN: NEGATIVE mg/dL
SPECIFIC GRAVITY, URINE: 1.015 (ref 1.005–1.030)
UROBILINOGEN UA: 0.2 mg/dL (ref 0.0–1.0)
pH: 5.5 (ref 5.0–8.0)

## 2015-02-10 LAB — I-STAT TROPONIN, ED: Troponin i, poc: 0.01 ng/mL (ref 0.00–0.08)

## 2015-02-10 LAB — I-STAT CHEM 8, ED
BUN: 23 mg/dL — ABNORMAL HIGH (ref 6–20)
CHLORIDE: 104 mmol/L (ref 101–111)
Calcium, Ion: 1.21 mmol/L (ref 1.13–1.30)
Creatinine, Ser: 0.7 mg/dL (ref 0.44–1.00)
Glucose, Bld: 134 mg/dL — ABNORMAL HIGH (ref 65–99)
HCT: 39 % (ref 36.0–46.0)
Hemoglobin: 13.3 g/dL (ref 12.0–15.0)
POTASSIUM: 3.7 mmol/L (ref 3.5–5.1)
SODIUM: 140 mmol/L (ref 135–145)
TCO2: 23 mmol/L (ref 0–100)

## 2015-02-10 LAB — URINE MICROSCOPIC-ADD ON

## 2015-02-10 LAB — PROTIME-INR
INR: 1 (ref 0.00–1.49)
Prothrombin Time: 13.4 seconds (ref 11.6–15.2)

## 2015-02-10 LAB — RAPID URINE DRUG SCREEN, HOSP PERFORMED
Amphetamines: NOT DETECTED
Barbiturates: NOT DETECTED
Benzodiazepines: NOT DETECTED
Cocaine: NOT DETECTED
Opiates: NOT DETECTED
Tetrahydrocannabinol: NOT DETECTED

## 2015-02-10 LAB — I-STAT CG4 LACTIC ACID, ED: Lactic Acid, Venous: 0.82 mmol/L (ref 0.5–2.0)

## 2015-02-10 LAB — ETHANOL

## 2015-02-10 MED ORDER — SODIUM CHLORIDE 0.9 % IV BOLUS (SEPSIS)
1000.0000 mL | Freq: Once | INTRAVENOUS | Status: AC
  Administered 2015-02-10: 1000 mL via INTRAVENOUS

## 2015-02-10 MED ORDER — HYDROGEN PEROXIDE 3 % EX SOLN
Freq: Every day | CUTANEOUS | Status: DC | PRN
  Filled 2015-02-10: qty 473

## 2015-02-10 MED ORDER — CEPHALEXIN 500 MG PO CAPS: 500.0000 mg | ORAL_CAPSULE | Freq: Two times a day (BID) | ORAL | Status: DC

## 2015-02-10 MED ORDER — CEPHALEXIN 500 MG PO CAPS
500.0000 mg | ORAL_CAPSULE | Freq: Once | ORAL | Status: AC
  Administered 2015-02-10: 500 mg via ORAL
  Filled 2015-02-10: qty 1

## 2015-02-10 MED ORDER — TETANUS-DIPHTH-ACELL PERTUSSIS 5-2.5-18.5 LF-MCG/0.5 IM SUSP
0.5000 mL | Freq: Once | INTRAMUSCULAR | Status: AC
  Administered 2015-02-10: 0.5 mL via INTRAMUSCULAR
  Filled 2015-02-10: qty 0.5

## 2015-02-10 NOTE — ED Notes (Signed)
[facility] Expected date:  Expected time:  Means of arrival:  Comments: EMS 81 yo female fell at home/superficial lac back of head 162/94

## 2015-02-10 NOTE — Discharge Instructions (Signed)
Urinary Tract Infection Katherine Flynn, your urine shows an infection which may be the cause of her fall. Take antibiotics as prescribed and seea primary care physician within 3 days for close follow-up. If any symptoms worsen come back to emergency department immediately. Thank you. A urinary tract infection (UTI) can occur any place along the urinary tract. The tract includes the kidneys, ureters, bladder, and urethra. A type of germ called bacteria often causes a UTI. UTIs are often helped with antibiotic medicine.  HOME CARE   If given, take antibiotics as told by your doctor. Finish them even if you start to feel better.  Drink enough fluids to keep your pee (urine) clear or pale yellow.  Avoid tea, drinks with caffeine, and bubbly (carbonated) drinks.  Pee often. Avoid holding your pee in for a long time.  Pee before and after having sex (intercourse).  Wipe from front to back after you poop (bowel movement) if you are a woman. Use each tissue only once. GET HELP RIGHT AWAY IF:   You have back pain.  You have lower belly (abdominal) pain.  You have chills.  You feel sick to your stomach (nauseous).  You throw up (vomit).  Your burning or discomfort with peeing does not go away.  You have a fever.  Your symptoms are not better in 3 days. MAKE SURE YOU:   Understand these instructions.  Will watch your condition.  Will get help right away if you are not doing well or get worse. Document Released: 02/14/2008 Document Revised: 05/22/2012 Document Reviewed: 03/28/2012 [facility] Patient Information 2015 [place], [place]. This information is not intended to replace advice given to you by your health care provider. Make sure you discuss any questions you have with your health care provider. Syncope Syncope means a person passes out (faints). The person usually wakes up in less than 5 minutes. It is important to seek medical care for syncope. HOME CARE  Have someone stay with  you until you feel normal.  Do not drive, use machines, or play sports until your doctor says it is okay.  Keep all doctor visits as told.  Lie down when you feel like you might pass out. Take deep breaths. Wait until you feel normal before standing up.  Drink enough fluids to keep your pee (urine) clear or pale yellow.  If you take blood pressure or heart medicine, get up slowly. Take several minutes to sit and then stand. GET HELP RIGHT AWAY IF:   You have a severe headache.  You have pain in the chest, belly (abdomen), or back.  You are bleeding from the mouth or butt (rectum).  You have black or tarry poop (stool).  You have an irregular or very fast heartbeat.  You have pain with breathing.  You keep passing out, or you have shaking (seizures) when you pass out.  You pass out when sitting or lying down.  You feel confused.  You have trouble walking.  You have severe weakness.  You have vision problems. If you fainted, call your local emergency services (911 in [place] Do not drive yourself to the hospital. MAKE SURE YOU:   Understand these instructions.  Will watch your condition.  Will get help right away if you are not doing well or get worse. Document Released: 02/14/2008 Document Revised: 02/27/2012 Document Reviewed: 10/27/2011 [facility] Patient Information 2015 [place], [place]. This information is not intended to replace advice given to you by your health care provider. Make sure you discuss  any questions you have with your health care provider. ° °

## 2015-02-10 NOTE — ED Notes (Signed)
Pt alert, oriented to self, place and situation. C/o right sided posterior head pain. Lac to same, bleeding controlled to same. Denies pain in neck, hips, legs.

## 2015-02-10 NOTE — ED Provider Notes (Signed)
CSN: [id number]     Arrival date & time 02/10/15  2037 History   First MD Initiated Contact with Patient 02/10/15 2102     Chief Complaint  Patient presents with  . Fall     (Consider location/radiation/quality/duration/timing/severity/associated sxs/prior Treatment) HPI Katherine Flynn is a 81 y.o. female with past medical history of hypertension, COPD presenting today after a fall. Patient was at home and she fell and hit her head. She denies loss of consciousness. Her neighbor was in the house checking on her. Found her in a pull of blood. Patient presents for evaluation. Patient does not take any blood thinners. She has had falls in the past but none in which she started bleeding. Patient only admits to pain in her head. She denies neurological symptoms. She has no further complaints.  10 Systems reviewed and are negative for acute change except as noted in the HPI.    Past Medical History  Diagnosis Date  . GERD (gastroesophageal reflux disease)   . Hypertension   . Thyroid disease   . COPD (chronic obstructive pulmonary disease)   . Osteoarthritis   . Weight loss   . History of pyloric stenosis   . Chronic pain syndrome   . Macular degeneration    Past Surgical History  Procedure Laterality Date  . Appendectomy    . Cataract extraction    . Orif hip fracture    . Tubal ligation    . Nissan fundoplication  08/2007   No family history on file. History  Substance Use Topics  . Smoking status: Never Smoker   . Smokeless tobacco: Never Used  . Alcohol Use: No   OB History    Gravida Para Term Preterm AB TAB SAB Ectopic Multiple Living   Review of Systems    Allergies  Review of patient's allergies indicates no known allergies.  Home Medications   Prior to Admission medications   Medication Sig Start Date End Date Taking? Authorizing Provider  acetaminophen (PAIN RELIEF) 500 MG tablet Take 500 mg by mouth every 6 (six) hours as needed  for mild pain or moderate pain.    Yes Historical Provider, MD  Ibuprofen-Diphenhydramine Cit 200-38 MG TABS Take 1-2 tablets by mouth at bedtime as needed (sleep).   Yes Historical Provider, MD  levothyroxine (SYNTHROID, LEVOTHROID) 75 MCG tablet TAKE ONE TABLET BY MOUTH EVERY DAY Patient taking differently: Take 75 mcg by mouth daily.  10/26/14  Yes Gordy Savers, MD  lisinopril (PRINIVIL,ZESTRIL) 20 MG tablet TAKE ONE TABLET BY MOUTH EVERY DAY Patient taking differently: Take 20 mg by mouth daily.  10/26/14  Yes Gordy Savers, MD  Multiple Vitamins-Minerals (CENTRUM SILVER PO) Take 1 tablet by mouth daily.   Yes Historical Provider, MD  Omega-3 Fatty Acids (FISH OIL) 1000 MG CAPS Take 1,000 mg by mouth daily.   Yes Historical Provider, MD   BP 186/84 mmHg  Pulse 110  Temp(Src) 98.6 F (37 C) (Oral)  Resp 18  SpO2 95% Physical Exam  Constitutional: She is oriented to person, place, and time. She appears well-developed and well-nourished. No distress.  HENT:  Head: Normocephalic.  Nose: Nose normal.  Mouth/Throat: Oropharynx is clear and moist. No oropharyngeal exudate.  Large soft tissue hematoma at the right posterior parietal area. There is no associated laceration, there is associated abrasion and skin tearing. There is no active hemorrhage. Dried blood seen throughout her  hair.  Eyes: Conjunctivae and EOM are normal. Pupils are equal, round, and reactive to light. No scleral icterus.  Neck: Normal range of motion. Neck supple. No JVD present. No tracheal deviation present. No thyromegaly present.  Cardiovascular: Normal rate, regular rhythm and normal heart sounds.  Exam reveals no gallop and no friction rub.   No murmur heard. Pulmonary/Chest: Effort normal and breath sounds normal. No respiratory distress. She has no wheezes. She exhibits no tenderness.  Abdominal: Soft. Bowel sounds are normal. She exhibits no distension and no mass. There is no tenderness. There is no  rebound and no guarding.  Musculoskeletal: Normal range of motion. She exhibits no edema or tenderness.  Lymphadenopathy:    She has no cervical adenopathy.  Neurological: She is alert and oriented to person, place, and time. No cranial nerve deficit. She exhibits normal muscle tone.  Normal strength and sensation in all activities. Normal cerebellar testing.  Skin: Skin is warm and dry. No rash noted. No erythema. No pallor.  Nursing note and vitals reviewed.   ED Course  Procedures (including critical care time) Labs Review Labs Reviewed  CBC WITH DIFFERENTIAL/PLATELET - Abnormal; Notable for the following:    Neutrophils Relative % 78 (*)    Neutro Abs 7.8 (*)    All other components within normal limits  URINALYSIS, ROUTINE W REFLEX MICROSCOPIC (NOT AT [facility]) - Abnormal; Notable for the following:    APPearance CLOUDY (*)    Hgb urine dipstick SMALL (*)    Nitrite POSITIVE (*)    Leukocytes, UA TRACE (*)    All other components within normal limits  URINE MICROSCOPIC-ADD ON - Abnormal; Notable for the following:    Bacteria, UA MANY (*)    All other components within normal limits  I-STAT CHEM 8, ED - Abnormal; Notable for the following:    BUN 23 (*)    Glucose, Bld 134 (*)    All other components within normal limits  PROTIME-INR  ETHANOL  URINE RAPID DRUG SCREEN (HOSP PERFORMED) NOT AT [facility]  I-STAT CG4 LACTIC ACID, ED  CBG MONITORING, ED  I-STAT TROPOININ, ED    Imaging Review Ct Head Wo Contrast  02/10/2015   CLINICAL DATA:  Fall at home with head injury and posterior scalp laceration.  EXAM: CT HEAD WITHOUT CONTRAST  CT CERVICAL SPINE WITHOUT CONTRAST  TECHNIQUE: Multidetector CT imaging of the head and cervical spine was performed following the standard protocol without intravenous contrast. Multiplanar CT image reconstructions of the cervical spine were also generated.  COMPARISON:  None.  FINDINGS: CT HEAD FINDINGS  Scalp hematoma present over the posterior right  parietal region. No evidence of skull fracture or soft tissue foreign body. The brain demonstrates diffuse atrophy is well as moderately advanced small vessel ischemic changes in the periventricular white matter. The brain demonstrates no evidence of hemorrhage, infarction, edema, mass effect, extra-axial fluid collection, hydrocephalus or mass lesion.  CT CERVICAL SPINE FINDINGS  The cervical spine shows normal alignment. There is no evidence of acute fracture. No soft tissue swelling or hematoma is identified. Moderate cervical spondylosis present throughout the cervical spine. There is a 4 mm anterolisthesis of C3 on C4.  No bony or soft tissue lesions are seen. The visualized airway is normally patent. Dystrophic calcification is identified in the right lobe of the thyroid gland. There is calcified plaque at the right carotid bifurcation. Visualized lung apices demonstrate calcified pleural plaque bilaterally.  IMPRESSION: 1. Right posterior parietal scalp hematoma without evidence  of skull fracture or intracranial injury. The brain demonstrates diffuse atrophy and advanced small vessel disease. 2. Cervical spondylosis with 4 mm anterolisthesis of C3 on C4. No acute fracture is identified.   Electronically Signed   By: Irish Lack M.D.   On: 02/10/2015 22:26   Ct Cervical Spine Wo Contrast  02/10/2015   CLINICAL DATA:  Fall at home with head injury and posterior scalp laceration.  EXAM: CT HEAD WITHOUT CONTRAST  CT CERVICAL SPINE WITHOUT CONTRAST  TECHNIQUE: Multidetector CT imaging of the head and cervical spine was performed following the standard protocol without intravenous contrast. Multiplanar CT image reconstructions of the cervical spine were also generated.  COMPARISON:  None.  FINDINGS: CT HEAD FINDINGS  Scalp hematoma present over the posterior right parietal region. No evidence of skull fracture or soft tissue foreign body. The brain demonstrates diffuse atrophy is well as moderately advanced  small vessel ischemic changes in the periventricular white matter. The brain demonstrates no evidence of hemorrhage, infarction, edema, mass effect, extra-axial fluid collection, hydrocephalus or mass lesion.  CT CERVICAL SPINE FINDINGS  The cervical spine shows normal alignment. There is no evidence of acute fracture. No soft tissue swelling or hematoma is identified. Moderate cervical spondylosis present throughout the cervical spine. There is a 4 mm anterolisthesis of C3 on C4.  No bony or soft tissue lesions are seen. The visualized airway is normally patent. Dystrophic calcification is identified in the right lobe of the thyroid gland. There is calcified plaque at the right carotid bifurcation. Visualized lung apices demonstrate calcified pleural plaque bilaterally.  IMPRESSION: 1. Right posterior parietal scalp hematoma without evidence of skull fracture or intracranial injury. The brain demonstrates diffuse atrophy and advanced small vessel disease. 2. Cervical spondylosis with 4 mm anterolisthesis of C3 on C4. No acute fracture is identified.   Electronically Signed   By: Irish Lack M.D.   On: 02/10/2015 22:26     EKG Interpretation   Date/Time:  Wednesday February 10 2015 21:48:48 EDT Ventricular Rate:  106 PR Interval:  155 QRS Duration: 137 QT Interval:  403 QTC Calculation: 535 R Axis:   -57 Text Interpretation:  Sinus tachycardia Probable left atrial enlargement  RBBB and LAFB Left ventricular hypertrophy STD in V4-V6, wandering  baseline, will repeat Confirmed by Erroll Luna [id number]) on  02/10/2015 10:26:03 PM      MDM   Final diagnoses:  Syncope  Syncope    Patient presents emergency department for a fall. She does not frequently have falls at home. Workup was established to discover etiology of the fall. Urinalysis reveals an infection. She'll be treated with Keflex, first does was given in the ED. CT scan of head and neck does not reveal any significant  intracranial injury.  At this time patient is her normal self for her neighbor. Her tachycardia has resolved after 1 L of IV fluids. She appears in no acute distress. I believe she is safe for home management of this urine infection. Primary care follow-up was advised within 3 days. Her vital signs remain within her normal limits and she is safe for discharge.  Tomasita Crumble, MD 02/11/15 2307

## 2015-02-10 NOTE — ED Notes (Signed)
Pt presents from home via EMS for fall. Pt found lying on floor, hit head on door frame, lac to posterior head. Denies LOC.  VS 162/94, 110HR, 119CBG, 16 RESP

## 2015-02-11 NOTE — ED Notes (Signed)
Pt wound cleaned with sterile water. Approximately 2" skin tear to right posterior head.

## 2015-02-17 DIAGNOSIS — Z961 Presence of intraocular lens: Secondary | ICD-10-CM | POA: Diagnosis not present

## 2015-02-17 DIAGNOSIS — H3531 Nonexudative age-related macular degeneration: Secondary | ICD-10-CM | POA: Diagnosis not present

## 2015-02-17 DIAGNOSIS — H524 Presbyopia: Secondary | ICD-10-CM | POA: Diagnosis not present

## 2015-02-18 ENCOUNTER — Ambulatory Visit: Payer: Commercial Managed Care - HMO | Admitting: Internal Medicine

## 2015-02-22 ENCOUNTER — Encounter: Payer: Self-pay | Admitting: Internal Medicine

## 2015-02-22 ENCOUNTER — Ambulatory Visit (INDEPENDENT_AMBULATORY_CARE_PROVIDER_SITE_OTHER): Payer: Commercial Managed Care - HMO | Admitting: Internal Medicine

## 2015-02-22 VITALS — BP 140/90 | HR 98 | Temp 98.3°F | Resp 18 | Ht <= 58 in | Wt 97.0 lb

## 2015-02-22 DIAGNOSIS — I10 Essential (primary) hypertension: Secondary | ICD-10-CM | POA: Diagnosis not present

## 2015-02-22 DIAGNOSIS — K219 Gastro-esophageal reflux disease without esophagitis: Secondary | ICD-10-CM

## 2015-02-22 DIAGNOSIS — R2681 Unsteadiness on feet: Secondary | ICD-10-CM | POA: Diagnosis not present

## 2015-02-22 DIAGNOSIS — E039 Hypothyroidism, unspecified: Secondary | ICD-10-CM

## 2015-02-22 NOTE — Patient Instructions (Signed)
Report any new or worsening symptoms  Return in 5 months for follow-up

## 2015-02-22 NOTE — Progress Notes (Signed)
Pre visit review using our clinic review tool, if applicable. No additional management support is needed unless otherwise documented below in the visit note. 

## 2015-02-22 NOTE — Progress Notes (Signed)
Subjective:    Patient ID: Katherine Flynn, female    DOB: 1923-06-17, 81 y.o.   MRN: [id number]  HPI  Patient is seen today following an emergency room visit on June 1  ED Note 02/10/15 Patient presents emergency department for a fall. She does not frequently have falls at home. Workup was established to discover etiology of the fall. Urinalysis reveals an infection. She'll be treated with Keflex, first does was given in the ED. CT scan of head and neck does not reveal any significant intracranial injury. At this time patient is her normal self for her neighbor. Her tachycardia has resolved after 1 L of IV fluids. She appears in no acute distress. I believe she is safe for home management of this urine infection. Primary care follow-up was advised within 3 days. Her vital signs remain within her normal limits and she is safe for discharge.  The patient has had no recurrent falls.  She states that she was in the bathroom and simply lost her balance and fell backwards.  There was no dizziness or presyncopal symptoms. She was treated for a possible UTI.  Denies any urinary frequency or dysuria  She has treated hypertension which has been stable. No new concerns or complaints  ED records reviewed  Past Medical History  Diagnosis Date  . GERD (gastroesophageal reflux disease)   . Hypertension   . Thyroid disease   . COPD (chronic obstructive pulmonary disease)   . Osteoarthritis   . Weight loss   . History of pyloric stenosis   . Chronic pain syndrome   . Macular degeneration     History   Social History  . Marital Status: Widowed    Spouse Name: N/A  . Number of Children: N/A  . Years of Education: N/A   Occupational History  . Not on file.   Social History Main Topics  . Smoking status: Never Smoker   . Smokeless tobacco: Never Used  . Alcohol Use: No  . Drug Use: No  . Sexual Activity: Not on file   Other Topics Concern  . Not on file   Social History  Narrative    Past Surgical History  Procedure Laterality Date  . Appendectomy    . Cataract extraction    . Orif hip fracture    . Tubal ligation    . Nissan fundoplication  08/2007    No family history on file.  No Known Allergies  Current Outpatient Prescriptions on File Prior to Visit  Medication Sig Dispense Refill  . acetaminophen (PAIN RELIEF) 500 MG tablet Take 500 mg by mouth every 6 (six) hours as needed for mild pain or moderate pain.     . Ibuprofen-Diphenhydramine Cit 200-38 MG TABS Take 1-2 tablets by mouth at bedtime as needed (sleep).    Marland Kitchen levothyroxine (SYNTHROID, LEVOTHROID) 75 MCG tablet TAKE ONE TABLET BY MOUTH EVERY DAY (Patient taking differently: Take 75 mcg by mouth daily. ) 90 tablet 1  . lisinopril (PRINIVIL,ZESTRIL) 20 MG tablet TAKE ONE TABLET BY MOUTH EVERY DAY (Patient taking differently: Take 20 mg by mouth daily. ) 90 tablet 1  . Multiple Vitamins-Minerals (CENTRUM SILVER PO) Take 1 tablet by mouth daily.    . Omega-3 Fatty Acids (FISH OIL) 1000 MG CAPS Take 1,000 mg by mouth daily.     No current facility-administered medications on file prior to visit.    BP 140/90 mmHg  Pulse 98  Temp(Src) 98.3 F (36.8 C) (Oral)  Resp  18  Ht  (1.473 m)  Wt 97 lb (43.999 kg)  BMI 20.28 kg/m2  SpO2 96%      Review of Systems  Constitutional: Negative.   HENT: Negative for congestion, dental problem, hearing loss, rhinorrhea, sinus pressure, sore throat and tinnitus.   Eyes: Negative for pain, discharge and visual disturbance.  Respiratory: Negative for cough and shortness of breath.   Cardiovascular: Negative for chest pain, palpitations and leg swelling.  Gastrointestinal: Negative for nausea, vomiting, abdominal pain, diarrhea, constipation, blood in stool and abdominal distention.  Genitourinary: Negative for dysuria, urgency, frequency, hematuria, flank pain, vaginal bleeding, vaginal discharge, difficulty urinating, vaginal pain and pelvic  pain.  Musculoskeletal: Positive for back pain, arthralgias and gait problem. Negative for joint swelling.  Skin: Negative for rash.  Neurological: Negative for dizziness, syncope, speech difficulty, weakness, numbness and headaches.  Hematological: Negative for adenopathy.  Psychiatric/Behavioral: Negative for behavioral problems, dysphoric mood and agitation. The patient is not nervous/anxious.        Objective:   Physical Exam  Constitutional: She is oriented to person, place, and time. She appears well-developed and well-nourished.  HENT:  Head: Normocephalic.  Right Ear: External ear normal.  Left Ear: External ear normal.  Mouth/Throat: Oropharynx is clear and moist.  Eyes: Conjunctivae and EOM are normal. Pupils are equal, round, and reactive to light.  Neck: Normal range of motion. Neck supple. No thyromegaly present.  Cardiovascular: Normal rate, regular rhythm, normal heart sounds and intact distal pulses.   Pulmonary/Chest: Effort normal and breath sounds normal.  Abdominal: Soft. Bowel sounds are normal. She exhibits no mass. There is no tenderness.  Musculoskeletal: Normal range of motion.  Lymphadenopathy:    She has no cervical adenopathy.  Neurological: She is alert and oriented to person, place, and time.  Skin: Skin is warm and dry. No rash noted.  Resolving hematoma right occipital scalp  Psychiatric: She has a normal mood and affect. Her behavior is normal.          Assessment & Plan:   Gait instability Status post recent fall Resolving right occipital hematoma Hypertension, stable Osteoarthritis  No change in medical regimen Recheck 5 months for follow-up and flu vaccine  Patient will report any new or worsening symptoms She has been encouraged to use her walker at all times

## 2015-04-18 ENCOUNTER — Emergency Department (HOSPITAL_COMMUNITY): Payer: Commercial Managed Care - HMO

## 2015-04-18 ENCOUNTER — Observation Stay (HOSPITAL_COMMUNITY)
Admission: EM | Admit: 2015-04-18 | Discharge: 2015-04-21 | Disposition: A | Discharge status: Skilled Nursing Facility | Payer: Commercial Managed Care - HMO | Attending: Internal Medicine | Admitting: Internal Medicine

## 2015-04-18 DIAGNOSIS — S72001A Fracture of unspecified part of neck of right femur, initial encounter for closed fracture: Secondary | ICD-10-CM | POA: Diagnosis not present

## 2015-04-18 DIAGNOSIS — J449 Chronic obstructive pulmonary disease, unspecified: Secondary | ICD-10-CM | POA: Diagnosis not present

## 2015-04-18 DIAGNOSIS — E46 Unspecified protein-calorie malnutrition: Secondary | ICD-10-CM | POA: Diagnosis not present

## 2015-04-18 DIAGNOSIS — R739 Hyperglycemia, unspecified: Secondary | ICD-10-CM | POA: Diagnosis present

## 2015-04-18 DIAGNOSIS — Y792 Prosthetic and other implants, materials and accessory orthopedic devices associated with adverse incidents: Secondary | ICD-10-CM | POA: Diagnosis not present

## 2015-04-18 DIAGNOSIS — R509 Fever, unspecified: Secondary | ICD-10-CM

## 2015-04-18 DIAGNOSIS — D509 Iron deficiency anemia, unspecified: Secondary | ICD-10-CM | POA: Diagnosis not present

## 2015-04-18 DIAGNOSIS — E440 Moderate protein-calorie malnutrition: Secondary | ICD-10-CM | POA: Insufficient documentation

## 2015-04-18 DIAGNOSIS — Z6841 Body Mass Index (BMI) 40.0 and over, adult: Secondary | ICD-10-CM | POA: Diagnosis not present

## 2015-04-18 DIAGNOSIS — G894 Chronic pain syndrome: Secondary | ICD-10-CM | POA: Insufficient documentation

## 2015-04-18 DIAGNOSIS — Z96649 Presence of unspecified artificial hip joint: Secondary | ICD-10-CM

## 2015-04-18 DIAGNOSIS — W19XXXA Unspecified fall, initial encounter: Secondary | ICD-10-CM | POA: Diagnosis not present

## 2015-04-18 DIAGNOSIS — S32311A Displaced avulsion fracture of right ilium, initial encounter for closed fracture: Secondary | ICD-10-CM | POA: Diagnosis not present

## 2015-04-18 DIAGNOSIS — I10 Essential (primary) hypertension: Secondary | ICD-10-CM | POA: Diagnosis not present

## 2015-04-18 DIAGNOSIS — H3530 Unspecified macular degeneration: Secondary | ICD-10-CM | POA: Insufficient documentation

## 2015-04-18 DIAGNOSIS — E43 Unspecified severe protein-calorie malnutrition: Secondary | ICD-10-CM | POA: Insufficient documentation

## 2015-04-18 DIAGNOSIS — S32301A Unspecified fracture of right ilium, initial encounter for closed fracture: Secondary | ICD-10-CM | POA: Diagnosis not present

## 2015-04-18 DIAGNOSIS — T84040A Periprosthetic fracture around internal prosthetic right hip joint, initial encounter: Secondary | ICD-10-CM | POA: Diagnosis not present

## 2015-04-18 DIAGNOSIS — K219 Gastro-esophageal reflux disease without esophagitis: Secondary | ICD-10-CM | POA: Diagnosis not present

## 2015-04-18 DIAGNOSIS — E039 Hypothyroidism, unspecified: Secondary | ICD-10-CM | POA: Insufficient documentation

## 2015-04-18 DIAGNOSIS — S32391A Other fracture of right ilium, initial encounter for closed fracture: Principal | ICD-10-CM | POA: Insufficient documentation

## 2015-04-18 DIAGNOSIS — Z96641 Presence of right artificial hip joint: Secondary | ICD-10-CM | POA: Insufficient documentation

## 2015-04-18 DIAGNOSIS — Z66 Do not resuscitate: Secondary | ICD-10-CM | POA: Insufficient documentation

## 2015-04-18 DIAGNOSIS — M978XXA Periprosthetic fracture around other internal prosthetic joint, initial encounter: Secondary | ICD-10-CM

## 2015-04-18 LAB — CBC WITH DIFFERENTIAL/PLATELET
BASOS ABS: 0 10*3/uL (ref 0.0–0.1)
Basophils Relative: 0 % (ref 0–1)
Eosinophils Absolute: 0.1 10*3/uL (ref 0.0–0.7)
Eosinophils Relative: 1 % (ref 0–5)
HCT: 35.1 % — ABNORMAL LOW (ref 36.0–46.0)
HEMOGLOBIN: 11.5 g/dL — AB (ref 12.0–15.0)
LYMPHS PCT: 16 % (ref 12–46)
Lymphs Abs: 1.5 10*3/uL (ref 0.7–4.0)
MCH: 29.7 pg (ref 26.0–34.0)
MCHC: 32.8 g/dL (ref 30.0–36.0)
MCV: 90.7 fL (ref 78.0–100.0)
Monocytes Absolute: 0.6 10*3/uL (ref 0.1–1.0)
Monocytes Relative: 6 % (ref 3–12)
NEUTROS ABS: 7 10*3/uL (ref 1.7–7.7)
NEUTROS PCT: 77 % (ref 43–77)
Platelets: 336 10*3/uL (ref 150–400)
RBC: 3.87 MIL/uL (ref 3.87–5.11)
RDW: 13.7 % (ref 11.5–15.5)
WBC: 9.1 10*3/uL (ref 4.0–10.5)

## 2015-04-18 LAB — BASIC METABOLIC PANEL
Anion gap: 9 (ref 5–15)
BUN: 12 mg/dL (ref 6–20)
CHLORIDE: 106 mmol/L (ref 101–111)
CO2: 26 mmol/L (ref 22–32)
Calcium: 9.9 mg/dL (ref 8.9–10.3)
Creatinine, Ser: 0.74 mg/dL (ref 0.44–1.00)
GFR calc non Af Amer: >60 mL/min (ref >60)
GLUCOSE: 129 mg/dL — AB (ref 65–99)
Potassium: 4 mmol/L (ref 3.5–5.1)
SODIUM: 141 mmol/L (ref 135–145)

## 2015-04-18 MED ORDER — SODIUM CHLORIDE 0.9 % IV SOLN
INTRAVENOUS | Status: DC
  Administered 2015-04-19: 50 mL/h via INTRAVENOUS

## 2015-04-18 MED ORDER — MORPHINE SULFATE 2 MG/ML IJ SOLN
2.0000 mg | Freq: Once | INTRAMUSCULAR | Status: AC
  Administered 2015-04-18: 2 mg via INTRAVENOUS
  Filled 2015-04-18: qty 1

## 2015-04-18 MED ORDER — LISINOPRIL 20 MG PO TABS
20.0000 mg | ORAL_TABLET | Freq: Every day | ORAL | Status: DC
  Administered 2015-04-19 – 2015-04-21 (×3): 20 mg via ORAL
  Filled 2015-04-18 (×3): qty 1

## 2015-04-18 MED ORDER — LEVOTHYROXINE SODIUM 75 MCG PO TABS
75.0000 ug | ORAL_TABLET | Freq: Every day | ORAL | Status: DC
  Administered 2015-04-19 – 2015-04-21 (×3): 75 ug via ORAL
  Filled 2015-04-18 (×3): qty 1

## 2015-04-18 MED ORDER — ONDANSETRON HCL 4 MG/2ML IJ SOLN
4.0000 mg | Freq: Once | INTRAMUSCULAR | Status: AC
  Administered 2015-04-18: 4 mg via INTRAVENOUS
  Filled 2015-04-18: qty 2

## 2015-04-18 MED ORDER — ENOXAPARIN SODIUM 30 MG/0.3ML ~~LOC~~ SOLN
30.0000 mg | SUBCUTANEOUS | Status: DC
  Administered 2015-04-19 – 2015-04-20 (×2): 30 mg via SUBCUTANEOUS
  Filled 2015-04-18 (×3): qty 0.3

## 2015-04-18 MED ORDER — POLYETHYLENE GLYCOL 3350 17 G PO PACK: 17.0000 g | PACK | Freq: Every day | ORAL | Status: DC | PRN

## 2015-04-18 MED ORDER — OXYCODONE HCL 5 MG PO TABS
5.0000 mg | ORAL_TABLET | ORAL | Status: DC | PRN
  Administered 2015-04-18 (×2): 5 mg via ORAL
  Administered 2015-04-19 (×5): 10 mg via ORAL
  Administered 2015-04-20: 5 mg via ORAL
  Administered 2015-04-20 – 2015-04-21 (×3): 10 mg via ORAL
  Filled 2015-04-18: qty 1
  Filled 2015-04-18 (×4): qty 2
  Filled 2015-04-18 (×2): qty 1
  Filled 2015-04-18 (×4): qty 2

## 2015-04-18 MED ORDER — METHOCARBAMOL 500 MG PO TABS: 500.0000 mg | ORAL_TABLET | Freq: Four times a day (QID) | ORAL | Status: DC | PRN

## 2015-04-18 MED ORDER — DOCUSATE SODIUM 100 MG PO CAPS
100.0000 mg | ORAL_CAPSULE | Freq: Two times a day (BID) | ORAL | Status: DC
  Administered 2015-04-19 – 2015-04-21 (×5): 100 mg via ORAL
  Filled 2015-04-18 (×6): qty 1

## 2015-04-18 MED ORDER — MORPHINE SULFATE 2 MG/ML IJ SOLN: 1.0000 mg | INTRAMUSCULAR | Status: DC | PRN

## 2015-04-18 MED ORDER — METHOCARBAMOL 1000 MG/10ML IJ SOLN
500.0000 mg | Freq: Four times a day (QID) | INTRAMUSCULAR | Status: DC | PRN
  Filled 2015-04-18: qty 5

## 2015-04-18 MED ORDER — BOOST / RESOURCE BREEZE PO LIQD
1.0000 | Freq: Three times a day (TID) | ORAL | Status: DC
  Administered 2015-04-18 – 2015-04-21 (×7): 1 via ORAL

## 2015-04-18 NOTE — ED Notes (Signed)
Patient transported to X-ray 

## 2015-04-18 NOTE — ED Notes (Signed)
Pt reports tripping on her rug this am when using the restroom at 0500. Pt fell and has right hip pain, difficulty bearing weight.

## 2015-04-18 NOTE — H&P (Signed)
[facility] History and Physical                                                                                    Katherine Flynn, is a 81 y.o. female  MRN: [id number]   DOB - 08-14-1923  Admit Date - 04/18/2015  Outpatient Primary MD for the patient is Rogelia Boga, MD  Referring MD: Blinda Leatherwood / ER  Consulting M.D; Orthopedics MD on call ([facility])  With History of -  Past Medical History  Diagnosis Date  . GERD (gastroesophageal reflux disease)   . Hypertension   . Thyroid disease   . COPD (chronic obstructive pulmonary disease)   . Osteoarthritis   . Weight loss   . History of pyloric stenosis   . Chronic pain syndrome   . Macular degeneration       Past Surgical History  Procedure Laterality Date  . Appendectomy    . Cataract extraction    . Orif hip fracture    . Tubal ligation    . Nissan fundoplication  08/2007    in for   Chief Complaint  Patient presents with  . Fall  . Hip Pain     HPI This is a 81 year old female patient with history of severe GERD and prior gastric surgical correction, hypertension, thyroid disease with noncompliance with Synthroid, protein calorie malnutrition (chronic), recurrent mechanical falls due to gait imbalance and history of prior surgeries to the right hip with initial surgery greater than 20 years ago with subsequent revision around 10 years ago. Patient sustained a mechanical fall by tripping over her feet prior to admission and developed severe right anterior hip pain. Subsequent she was brought to the ER for evaluation.  In the ER she was afebrile and mildly hypertensive with a BP 160/92, respirations 18, pulse 94, room air sats 95%. I panel unremarkable except for mild hyperglycemia glucose 129, CBC unremarkable except for mild anemia hemoglobin 11.5. Plain films reveal acute right iliac wing fracture with minimal displacement as well as acute. Hardware fracture of the right hip arthroplasty with  displacement of this appeared fracture fragment and the fracture line extending to involve the lesser trochanter. Dr. Barbera Setters and discussed with Dr. Ave Filter on call for orthopedics who requested admission by the hospitalist service. No surgery planned at this time. Orthopedic service plans to see the patient later this afternoon or first thing in the morning on 8/8. While in the ER patient has been given 2 mg of IV morphine as well as a dose of Zofran.   Review of Systems   In addition to the HPI above,  No Fever-chills, myalgias or other constitutional symptoms No Headache, changes with Vision or hearing, new weakness, tingling, numbness in any extremity, No Chest pain, Cough or Shortness of Breath, palpitations, orthopnea or DOE No Abdominal pain, N/V; no melena or hematochezia, no dark tarry stools, Bowel movements are regular, No dysuria, hematuria or flank pain No new skin rashes, lesions, masses or bruises, No new joints pains-aches No polyuria, polydypsia or polyphagia,  *A full 10 point Review of Systems was done, except as stated above, all other Review of Systems were negative.  Social History History  Substance Use Topics  . Smoking status: Never Smoker   . Smokeless tobacco: Never Used  . Alcohol Use: No    Resides at: Private residence  Lives with: Daughter  Ambulatory status: Primarily with walker and occasionally with a cane; has frequent falls   Family History No family history on file. reviewed with patient and daughter and no pertinent history identified   Prior to Admission medications   Medication Sig Start Date End Date Taking? Authorizing Provider  acetaminophen (PAIN RELIEF) 500 MG tablet Take 500 mg by mouth every 6 (six) hours as needed for mild pain or moderate pain.     Historical Provider, MD  Ibuprofen-Diphenhydramine Cit 200-38 MG TABS Take 1-2 tablets by mouth at bedtime as needed (sleep).    Historical Provider, MD  levothyroxine (SYNTHROID,  LEVOTHROID) 75 MCG tablet TAKE ONE TABLET BY MOUTH EVERY DAY Patient taking differently: Take 75 mcg by mouth daily.  10/26/14   Gordy Savers, MD  lisinopril (PRINIVIL,ZESTRIL) 20 MG tablet TAKE ONE TABLET BY MOUTH EVERY DAY Patient taking differently: Take 20 mg by mouth daily.  10/26/14   Gordy Savers, MD  Omega-3 Fatty Acids (FISH OIL) 1000 MG CAPS Take 1,000 mg by mouth daily.    Historical Provider, MD    No Known Allergies  Physical Exam  Vitals  Blood pressure 168/78, pulse 97, temperature 98.2 F (36.8 C), temperature source Oral, resp. rate 18, SpO2 95 %.   General:  In mild to moderate acute distress evidence for ongoing right anterior hip pain, appears malnourished and chronically ill  Psych:  Normal affect, Denies Suicidal or Homicidal ideations, Awake Alert, Oriented X 3. Speech and thought patterns are clear and appropriate, no apparent short term memory deficits  Neuro:   No focal neurological deficits, CN II through XII intact, Strength 5/5 all 4 extremities, Sensation intact all 4 extremities.  ENT:  Ears and Eyes appear Normal, Conjunctivae clear, PER. Moist oral mucosa without erythema or exudates.  Neck:  Supple, No lymphadenopathy appreciated  Respiratory:  Symmetrical chest wall movement, Good air movement bilaterally, CTAB. Room Air  Cardiac:  RRR, No Murmurs, no LE edema noted, no JVD, No carotid bruits, peripheral pulses palpable at 2+  Abdomen:  Positive bowel sounds, Soft, Non tender, Non distended,  No masses appreciated, no obvious hepatosplenomegaly  Skin:  No Cyanosis, Normal Skin Turgor, No Skin Rash or Bruise.  Musculoskeletal: Symmetrical without obvious trauma or injury except for swelling right anterior hip/pelvis region which is tender to touch but no apparent ecchymosis,  no effusions.  Data Review  CBC  Recent Labs Lab 04/18/15 1218  WBC 9.1  HGB 11.5*  HCT 35.1*  PLT 336  MCV 90.7  MCH 29.7  MCHC 32.8  RDW 13.7    LYMPHSABS 1.5  MONOABS 0.6  EOSABS 0.1  BASOSABS 0.0    Chemistries   Recent Labs Lab 04/18/15 1218  NA 141  K 4.0  CL 106  CO2 26  GLUCOSE 129*  BUN 12  CREATININE 0.74  CALCIUM 9.9    CrCl cannot be calculated (Unknown ideal weight.).  No results for input(s): TSH, T4TOTAL, T3FREE, THYROIDAB in the last 72 hours.  Invalid input(s): FREET3  Coagulation profile No results for input(s): INR, PROTIME in the last 168 hours.  No results for input(s): DDIMER in the last 72 hours.  Cardiac Enzymes No results for input(s): CKMB, TROPONINI, MYOGLOBIN in the last 168 hours.  Invalid input(s): CK  Invalid input(s): POCBNP  Urinalysis    Component Value Date/Time   COLORURINE YELLOW 02/10/2015 2130   APPEARANCEUR CLOUDY* 02/10/2015 2130   LABSPEC 1.015 02/10/2015 2130   PHURINE 5.5 02/10/2015 2130   GLUCOSEU NEGATIVE 02/10/2015 2130   HGBUR SMALL* 02/10/2015 2130   BILIRUBINUR NEGATIVE 02/10/2015 2130   KETONESUR NEGATIVE 02/10/2015 2130   PROTEINUR NEGATIVE 02/10/2015 2130   UROBILINOGEN 0.2 02/10/2015 2130   NITRITE POSITIVE* 02/10/2015 2130   LEUKOCYTESUR TRACE* 02/10/2015 2130    Imaging results:   Dg Hip Unilat  With Pelvis 2-3 Views Right  04/18/2015   CLINICAL DATA:  81 year old female with a history of fall at 4 a.m. Right hip pain.  EXAM: DG HIP (WITH OR WITHOUT PELVIS) 2-3V RIGHT  COMPARISON:  CT abdomen 07/05/2005.  FINDINGS: Compare to the prior CT scout image and CT, there has been interval right iliac wing fracture with minimal displacement.  Surgical changes of right hip arthroplasty with perihardware fracture and superior displacement of the superior fracture fragment. Fracture line extends medially to involve the lesser trochanter. Visualized femoral diaphysis is unremarkable. Components appear congruent.  Diffuse osteopenia.  Extensive vascular calcifications including aortic, iliac, and femoral disease.  Visualized proximal left femur unremarkable.   Degenerative changes of the spine.  The contours of the sacrum and left pelvis appear maintained.  IMPRESSION: Acute right iliac wing fracture with minimal displacement.  Acute perihardware fracture of right hip arthroplasty, with displacement of the superior fracture fragment and the fracture line extending to involve the lesser trochanter.  Extensive atherosclerosis.  Signed,  Yvone Neu. Loreta Ave, DO  [facility]  [facility]   Electronically Signed   By: Gilmer Mor D.O.   On: 04/18/2015 12:57     Assessment & Plan  Principal Problem:   Closed fracture of right iliac wing/ Peri-prosthetic fracture around prosthetic hip -Admit to orthopedic medical floor -Await formal orthopedic consultation -Focus on pain management: Oxycodone and IV morphine for breakthrough pain; Robaxin for musculoskeletal spasms -At this juncture appears that surgery is not indicated -Bed rest -Air mattress -Check vitamin D panel -We'll eventually need PT/OT evaluation-pending evaluation and weightbearing status may be a candidate for CIR -Unclear if patient will be able to weight-bear in the future; concerned about significant muscle wasting secondary to injury in setting of patient already malnourished  Active Problems:   Hypothyroidism -Patient documented with noncompliance and not taking medications -TSH February 2016 was 59.1 with follow-up TSH March 2016 was 24.87 -Check TSH this admission -Continue Synthroid    Essential hypertension -Continue lisinopril    Acute hyperglycemia -Likely from acute stress of recent injury -Check hemoglobin A1c    Iron deficiency anemia -Hemoglobin stable    Protein calorie malnutrition -Resource supplementation -Nutrition consultation    DVT Prophylaxis: Lovenox  Family Communication:   Daughter at bedside  Code Status:  DO NOT RESUSCITATE  Condition:  Guarded  Discharge disposition: Likely will discharge to some  type of rehabilitation or skilled nursing facility-this is dependent on PT/OT evaluations and whether or not patient has to undergo surgical procedure  Time spent in minutes : 60      ELLIS,ALLISON L. ANP on 04/18/2015 at 2:46 PM  Between 7am to 7pm - Pager - [phone]  After 7pm go to [email or web address] - password [email or web address]  And look for the night coverage person covering me after hours  [facility]

## 2015-04-18 NOTE — Progress Notes (Signed)
Patient complains of right lower extremity leg cramping per daughter Cordelia Pen. Nursing will continue to monitor.

## 2015-04-18 NOTE — ED Notes (Signed)
Pt from home for eval of fall while trying to use the bedside commode this morning, pt denies any dizziness or lightheadedness before fall. Pt now reports right hip pain that is worse when bearing weight, minimal shortening noted to right leg, no rotation at this time, distal pulses present. Sensation intact.

## 2015-04-18 NOTE — Progress Notes (Signed)
Patient is not to make any decisions alone. Son, Katherine Flynn or daughter, Katherine Flynn must be present per patient and children.

## 2015-04-18 NOTE — ED Provider Notes (Signed)
CSN: [id number]     Arrival date & time 04/18/15  1119 History   First MD Initiated Contact with Patient 04/18/15 1153     Chief Complaint  Patient presents with  . Fall  . Hip Pain     (Consider location/radiation/quality/duration/timing/severity/associated sxs/prior Treatment) HPI Comments: Patient presents to the emergency department for evaluation of right hip injury from a fall. Patient reports that she tripped over her rug when going to the bathroom at 5 AM. She fell onto her right side injuring the right hip. Since the fall she has been unable to put any weight on the right leg because of severe pain in the hip area. She does not think she hit her head, there was no loss of consciousness. She denies headache currently. She is not expressing any neck or back pain. She denies upper extremity injury, left leg including hip her not painful. She reports constant and severe pain in the right hip that worsens with movement. She has had previous right hip replacement.  Patient is a 81 y.o. female presenting with fall and hip pain.  Fall  Hip Pain    Past Medical History  Diagnosis Date  . GERD (gastroesophageal reflux disease)   . Hypertension   . Thyroid disease   . COPD (chronic obstructive pulmonary disease)   . Osteoarthritis   . Weight loss   . History of pyloric stenosis   . Chronic pain syndrome   . Macular degeneration    Past Surgical History  Procedure Laterality Date  . Appendectomy    . Cataract extraction    . Orif hip fracture    . Tubal ligation    . Nissan fundoplication  08/2007   No family history on file. History  Substance Use Topics  . Smoking status: Never Smoker   . Smokeless tobacco: Never Used  . Alcohol Use: No   OB History    Gravida Para Term Preterm AB TAB SAB Ectopic Multiple Living   Review of Systems  Musculoskeletal: Positive for arthralgias.  All other systems reviewed and are negative.     Allergies  Review  of patient's allergies indicates no known allergies.  Home Medications   Prior to Admission medications   Medication Sig Start Date End Date Taking? Authorizing Provider  acetaminophen (PAIN RELIEF) 500 MG tablet Take 500 mg by mouth every 6 (six) hours as needed for mild pain or moderate pain.     Historical Provider, MD  Ibuprofen-Diphenhydramine Cit 200-38 MG TABS Take 1-2 tablets by mouth at bedtime as needed (sleep).    Historical Provider, MD  levothyroxine (SYNTHROID, LEVOTHROID) 75 MCG tablet TAKE ONE TABLET BY MOUTH EVERY DAY Patient taking differently: Take 75 mcg by mouth daily.  10/26/14   Gordy Savers, MD  lisinopril (PRINIVIL,ZESTRIL) 20 MG tablet TAKE ONE TABLET BY MOUTH EVERY DAY Patient taking differently: Take 20 mg by mouth daily.  10/26/14   Gordy Savers, MD  Multiple Vitamins-Minerals (CENTRUM SILVER PO) Take 1 tablet by mouth daily.    Historical Provider, MD  Omega-3 Fatty Acids (FISH OIL) 1000 MG CAPS Take 1,000 mg by mouth daily.    Historical Provider, MD   BP 165/70 mmHg  Pulse 98  Temp(Src) 98.2 F (36.8 C) (Oral)  Resp 18  SpO2 95% Physical Exam  Constitutional: She is oriented to person, place, and time. She appears well-developed and well-nourished. No distress.  HENT:  Head: Normocephalic and atraumatic.  Right Ear: Hearing normal.  Left Ear: Hearing normal.  Nose: Nose normal.  Mouth/Throat: Oropharynx is clear and moist and mucous membranes are normal.  Eyes: Conjunctivae and EOM are normal. Pupils are equal, round, and reactive to light.  Neck: Normal range of motion. Neck supple. No spinous process tenderness and no muscular tenderness present.  Cardiovascular: Regular rhythm, S1 normal and S2 normal.  Exam reveals no gallop and no friction rub.   No murmur heard. Pulses:      Dorsalis pedis pulses are 1+ on the right side, and 1+ on the left side.  Pulmonary/Chest: Effort normal and breath sounds normal. No respiratory distress. She  exhibits no tenderness.  Abdominal: Soft. Normal appearance and bowel sounds are normal. There is no hepatosplenomegaly. There is no tenderness. There is no rebound, no guarding, no tenderness at McBurney's point and negative Murphy's sign. No hernia.  Musculoskeletal:       Right hip: She exhibits decreased range of motion (due to pain) and tenderness. She exhibits no deformity.       Left hip: Normal.       Right knee: Normal.       Right ankle: Normal.       Cervical back: Normal.       Thoracic back: Normal.       Lumbar back: Normal.  Neurological: She is alert and oriented to person, place, and time. She has normal strength. No cranial nerve deficit or sensory deficit. Coordination normal. GCS eye subscore is 4. GCS verbal subscore is 5. GCS motor subscore is 6.  Skin: Skin is warm, dry and intact. No rash noted. No cyanosis.  Psychiatric: She has a normal mood and affect. Her speech is normal and behavior is normal. Thought content normal.  Nursing note and vitals reviewed.   ED Course  Procedures (including critical care time) Labs Review Labs Reviewed  CBC WITH DIFFERENTIAL/PLATELET - Abnormal; Notable for the following:    Hemoglobin 11.5 (*)    HCT 35.1 (*)    All other components within normal limits  BASIC METABOLIC PANEL - Abnormal; Notable for the following:    Glucose, Bld 129 (*)    All other components within normal limits    Imaging Review Dg Hip Unilat  With Pelvis 2-3 Views Right  04/18/2015   CLINICAL DATA:  81 year old female with a history of fall at 4 a.m. Right hip pain.  EXAM: DG HIP (WITH OR WITHOUT PELVIS) 2-3V RIGHT  COMPARISON:  CT abdomen 07/05/2005.  FINDINGS: Compare to the prior CT scout image and CT, there has been interval right iliac wing fracture with minimal displacement.  Surgical changes of right hip arthroplasty with perihardware fracture and superior displacement of the superior fracture fragment. Fracture line extends medially to involve the  lesser trochanter. Visualized femoral diaphysis is unremarkable. Components appear congruent.  Diffuse osteopenia.  Extensive vascular calcifications including aortic, iliac, and femoral disease.  Visualized proximal left femur unremarkable.  Degenerative changes of the spine.  The contours of the sacrum and left pelvis appear maintained.  IMPRESSION: Acute right iliac wing fracture with minimal displacement.  Acute perihardware fracture of right hip arthroplasty, with displacement of the superior fracture fragment and the fracture line extending to involve the lesser trochanter.  Extensive atherosclerosis.  Signed,  Yvone Neu. Loreta Ave, DO  [facility]  [facility]   Electronically Signed   By: Gilmer Mor D.O.   On:  04/18/2015 12:57     EKG Interpretation None      MDM   Final diagnoses:  None   right iliac wing fracture  Right hip fracture  Patient presents to the ER for evaluation after a fall. Patient has isolated injury to the right hip area. Thorough examination of her did not reveal any evidence of tenderness, pain or injury elsewhere. X-ray shows right iliac wing fracture with evidence of peri-prosthesis fracture. Patient has had a right hip replacement approximately 10 years ago by Dr. Turner Daniels.  Case discussed with Dr. Ave Filter, on call for [facility]. Patient to be admitted by hospitalist service, either Dr. Ave Filter or Dr. Turner Daniels will see the patient later this afternoon or in the morning. No surgery planned currently.    Gilda Crease, MD 04/18/15 [phone]

## 2015-04-18 NOTE — ED Notes (Signed)
hospitalist at bedside

## 2015-04-19 DIAGNOSIS — I10 Essential (primary) hypertension: Secondary | ICD-10-CM

## 2015-04-19 DIAGNOSIS — S32391A Other fracture of right ilium, initial encounter for closed fracture: Secondary | ICD-10-CM | POA: Diagnosis not present

## 2015-04-19 DIAGNOSIS — E43 Unspecified severe protein-calorie malnutrition: Secondary | ICD-10-CM | POA: Insufficient documentation

## 2015-04-19 DIAGNOSIS — E038 Other specified hypothyroidism: Secondary | ICD-10-CM

## 2015-04-19 DIAGNOSIS — S32391S Other fracture of right ilium, sequela: Secondary | ICD-10-CM

## 2015-04-19 DIAGNOSIS — J449 Chronic obstructive pulmonary disease, unspecified: Secondary | ICD-10-CM | POA: Diagnosis not present

## 2015-04-19 DIAGNOSIS — T84048D Periprosthetic fracture around other internal prosthetic joint, subsequent encounter: Secondary | ICD-10-CM | POA: Diagnosis not present

## 2015-04-19 DIAGNOSIS — H3530 Unspecified macular degeneration: Secondary | ICD-10-CM | POA: Diagnosis not present

## 2015-04-19 DIAGNOSIS — E039 Hypothyroidism, unspecified: Secondary | ICD-10-CM | POA: Diagnosis not present

## 2015-04-19 DIAGNOSIS — G894 Chronic pain syndrome: Secondary | ICD-10-CM | POA: Diagnosis not present

## 2015-04-19 DIAGNOSIS — T84040A Periprosthetic fracture around internal prosthetic right hip joint, initial encounter: Secondary | ICD-10-CM | POA: Diagnosis not present

## 2015-04-19 DIAGNOSIS — W19XXXA Unspecified fall, initial encounter: Secondary | ICD-10-CM | POA: Diagnosis present

## 2015-04-19 DIAGNOSIS — D509 Iron deficiency anemia, unspecified: Secondary | ICD-10-CM | POA: Diagnosis not present

## 2015-04-19 DIAGNOSIS — S32301A Unspecified fracture of right ilium, initial encounter for closed fracture: Secondary | ICD-10-CM | POA: Diagnosis not present

## 2015-04-19 DIAGNOSIS — Z66 Do not resuscitate: Secondary | ICD-10-CM | POA: Diagnosis not present

## 2015-04-19 LAB — GLUCOSE, CAPILLARY: Glucose-Capillary: 150 mg/dL — ABNORMAL HIGH (ref 65–99)

## 2015-04-19 LAB — VITAMIN D 25 HYDROXY (VIT D DEFICIENCY, FRACTURES): Vit D, 25-Hydroxy: 46.5 ng/mL (ref 30.0–100.0)

## 2015-04-19 LAB — TSH: TSH: 4.561 u[IU]/mL — ABNORMAL HIGH (ref 0.350–4.500)

## 2015-04-19 MED ORDER — PROMETHAZINE HCL 25 MG RE SUPP
12.5000 mg | Freq: Once | RECTAL | Status: AC
  Administered 2015-04-19: 12.5 mg via RECTAL
  Filled 2015-04-19: qty 1

## 2015-04-19 MED ORDER — ONDANSETRON HCL 4 MG/2ML IJ SOLN
4.0000 mg | Freq: Four times a day (QID) | INTRAMUSCULAR | Status: DC | PRN
  Administered 2015-04-19: 4 mg via INTRAVENOUS
  Filled 2015-04-19: qty 2

## 2015-04-19 MED ORDER — METOPROLOL TARTRATE 25 MG PO TABS
25.0000 mg | ORAL_TABLET | Freq: Two times a day (BID) | ORAL | Status: DC
  Administered 2015-04-19 – 2015-04-21 (×4): 25 mg via ORAL
  Filled 2015-04-19 (×4): qty 1

## 2015-04-19 NOTE — Consult Note (Addendum)
PATIENT ID: Katherine Flynn  MRN: [id number]  DOB/AGE:  April 17, 1923 / 81 y.o.         Consult note  Patient presents with a complaint of right hip pain after suffering a fall in her home on 04/18/15.  She was transported to [facility] where she was diagnosed with a right illiac wing fracture.  She does hove a previous history of a primary right total hip performed by Dr. Renae Fickle in 1990 and then a revision of the acetabular component with Dr. Turner Daniels in the late 90's.  She does have an old periprosthetic greater trochanteric fracture from 2014.  Subjective:   Patient is alert, oriented, no Nausea, no Vomiting, yes passing gas, no Bowel Movement. Taking PO well with pt eating small bites and drinking fluids. Denies SOB, Chest or Calf Pain. Using Incentive Spirometer, PAS in place. Ambulate 50% WB to Right lower extrimity, Patient reports pain as mild,  Increased pain with any movement.   Objective: Vital signs in last 24 hours: Temp:  [98.1 F (36.7 C)-98.6 F (37 C)] 98.1 F (36.7 C) (08/08 0604) Pulse Rate:  [92-102] 102 (08/08 0604) Resp:  [16-18] 16 (08/08 0604) BP: (141-176)/(64-92) 151/84 mmHg (08/08 0604) SpO2:  [92 %-97 %] 95 % (08/08 0604)    Intake/Output from previous day: I/O last 3 completed shifts: In: 120 [P.O.:120] Out: 40 [Urine:40]   Intake/Output this shift:     LABORATORY DATA:  Recent Labs  04/18/15 1218  WBC 9.1  HGB 11.5*  HCT 35.1*  PLT 336  NA 141  K 4.0  CL 106  CO2 26  BUN 12  CREATININE 0.74  GLUCOSE 129*  CALCIUM 9.9    Examination: Neurologically intact Neurovascular intact Sensation intact distally Intact pulses distally Dorsiflexion/Plantar flexion intact No cellulitis present Compartment soft} Mild pain with palpation over right illiac wing.  X-rays: Right hip, AP and lateral shows iliac wing fracture, displaced about 1 cm,  Small crack in the lesser trochanter, nondisplaced.  There is a fracture of the greater trochanter  displaced 2 cm there is sclerotic at the edges and is old.the prosthesis, which is an Best boy stem HA-coated remains well fixed on x-ray.  Assessment:     Right Illiac Wing Fracture, nondisplaced right hip periprosthetic lesser trochanter fracture, and old periprosthetic right Greater Trochanteric Fracture,displaced 2 cm. ADDITIONAL DIAGNOSIS:  Hypertension and COPD  Plan:  Partial Weight Bearing @ 50% (PWB)  The plan for now is non-operative with a modified weight bearing status.  She is to be 50 % Weight bearing to the right leg for 6 weeks.  She may need SNF placement to work on ambulation.  No family present to discuss future planning.  She will need to follow up in 10-14 days in the office for repeat x rays.     PHILLIPS, ERIC R 04/19/2015, 7:25 AM

## 2015-04-19 NOTE — Progress Notes (Signed)
Patient Demographics:    Katherine Flynn, is a 81 y.o. female, DOB - [birth year], BSW:[id number]  Admit date - 04/18/2015   Admitting Physician Renae Fickle, MD  Outpatient Primary MD for the patient is Rogelia Boga, MD  LOS - 1   Chief Complaint  Patient presents with  . Fall  . Hip Pain        Subjective:    Katherine Flynn today has, No headache, No chest pain, No abdominal pain - No Nausea, No new weakness tingling or numbness, No Cough - SOB. Continues to have mild right-sided pelvic pain.   Assessment  & Plan :     1. Mechanical fall with closed right-sided  iliac wing and right-sided periprosthetic hip fracture. Seen by orthopedics, 50% weightbearing on the right side, PT, the new Lovenox for DVT prophylaxis. Will require SNF.   2. Hypothyroidism. TSH stable, continue home dose Synthroid.   3. Essential hypertension. Home dose lisinopril continue.   4. Iron deficiency anemia. No acute issues outpatient follow-up with PCP    5. Likely stress related hyperglycemia upon admission. A1c pending Will monitor.  No results found for: HGBA1C     Code Status : DO NOT RESUSCITATE  Family Communication  : None present  Disposition Plan  : SNF  Consults  :  Orthopedics  Procedures  :    DVT Prophylaxis  :  Lovenox    Lab Results  Component Value Date   PLT 336 04/18/2015    Inpatient Medications  Scheduled Meds: . docusate sodium  100 mg Oral BID  . enoxaparin (LOVENOX) injection  30 mg Subcutaneous Q24H  . feeding supplement  1 Container Oral TID BM  . levothyroxine  75 mcg Oral QAC breakfast  . lisinopril  20 mg Oral Daily   Continuous Infusions: . sodium chloride 50 mL/hr (04/19/15 0624)   PRN Meds:.methocarbamol **OR** methocarbamol (ROBAXIN)   IV, morphine injection, oxyCODONE, polyethylene glycol  Antibiotics  :     Anti-infectives    None        Objective:   Filed Vitals:   04/18/15 1530 04/18/15 1613 04/18/15 2031 04/19/15 0604  BP: 144/67 176/70 151/70 151/84  Pulse: 94 97 97 102  Temp:  98.6 F (37 C) 98.1 F (36.7 C) 98.1 F (36.7 C)  TempSrc:   Oral Oral  Resp:  18 16 16   SpO2: 94% 92% 95% 95%    Wt Readings from Last 3 Encounters:  02/22/15 43.999 kg (97 lb)  11/24/14 42.638 kg (94 lb)  10/26/14 45.36 kg (100 lb)     Intake/Output Summary (Last 24 hours) at 04/19/15 1113 Last data filed at 04/18/15 1853  Gross per 24 hour  Intake    120 ml  Output     40 ml  Net     80 ml     Physical Exam  Awake Alert, Oriented X 3, No new F.N deficits, Normal affect [place].AT,PERRAL Supple Neck,No JVD, No cervical lymphadenopathy appriciated.  Symmetrical Chest wall movement, Good air movement bilaterally, CTAB RRR,No Gallops,Rubs or new Murmurs, No Parasternal Heave +ve B.Sounds, Abd Soft, No tenderness, No organomegaly appriciated, No rebound - guarding or rigidity. No Cyanosis, Clubbing or edema, No new Rash or bruise  Data Review:   Micro Results No results found for this or any previous visit (from the past 240 hour(s)).  Radiology Reports Dg Hip Unilat  With Pelvis 2-3 Views Right  04/18/2015   CLINICAL DATA:  81 year old female with a history of fall at 4 a.m. Right hip pain.  EXAM: DG HIP (WITH OR WITHOUT PELVIS) 2-3V RIGHT  COMPARISON:  CT abdomen 07/05/2005.  FINDINGS: Compare to the prior CT scout image and CT, there has been interval right iliac wing fracture with minimal displacement.  Surgical changes of right hip arthroplasty with perihardware fracture and superior displacement of the superior fracture fragment. Fracture line extends medially to involve the lesser trochanter. Visualized femoral diaphysis is unremarkable. Components appear congruent.  Diffuse osteopenia.  Extensive vascular  calcifications including aortic, iliac, and femoral disease.  Visualized proximal left femur unremarkable.  Degenerative changes of the spine.  The contours of the sacrum and left pelvis appear maintained.  IMPRESSION: Acute right iliac wing fracture with minimal displacement.  Acute perihardware fracture of right hip arthroplasty, with displacement of the superior fracture fragment and the fracture line extending to involve the lesser trochanter.  Extensive atherosclerosis.  Signed,  Yvone Neu. Loreta Ave, DO  [facility]  [facility]   Electronically Signed   By: Gilmer Mor D.O.   On: 04/18/2015 12:57     CBC  Recent Labs Lab 04/18/15 1218  WBC 9.1  HGB 11.5*  HCT 35.1*  PLT 336  MCV 90.7  MCH 29.7  MCHC 32.8  RDW 13.7  LYMPHSABS 1.5  MONOABS 0.6  EOSABS 0.1  BASOSABS 0.0    Chemistries   Recent Labs Lab 04/18/15 1218  NA 141  K 4.0  CL 106  CO2 26  GLUCOSE 129*  BUN 12  CREATININE 0.74  CALCIUM 9.9   ------------------------------------------------------------------------------------------------------------------ CrCl cannot be calculated (Unknown ideal weight.). ------------------------------------------------------------------------------------------------------------------ No results for input(s): HGBA1C in the last 72 hours. ------------------------------------------------------------------------------------------------------------------ No results for input(s): CHOL, HDL, LDLCALC, TRIG, CHOLHDL, LDLDIRECT in the last 72 hours. ------------------------------------------------------------------------------------------------------------------  Recent Labs  04/19/15 0610  TSH 4.561*   ------------------------------------------------------------------------------------------------------------------ No results for input(s): VITAMINB12, FOLATE, FERRITIN, TIBC, IRON, RETICCTPCT in the last 72 hours.  Coagulation  profile No results for input(s): INR, PROTIME in the last 168 hours.  No results for input(s): DDIMER in the last 72 hours.  Cardiac Enzymes No results for input(s): CKMB, TROPONINI, MYOGLOBIN in the last 168 hours.  Invalid input(s): CK ------------------------------------------------------------------------------------------------------------------ Invalid input(s): POCBNP   Time Spent in minutes 35   Bralin Garry K M.D on 04/19/2015 at 11:13 AM  Between 7am to 7pm - Pager - [phone]  After 7pm go to [email or web address] - password [email or web address]  [facility] -  Office  [phone]

## 2015-04-19 NOTE — Progress Notes (Signed)
Orthopedic Tech Progress Note Patient Details:  Katherine Flynn 05/16/1923 [id number]  Patient ID: Katherine Flynn, female   DOB: [birth year], 81 y.o.   MRN: [id number]   Katherine Flynn 04/19/2015, 10:20 AMUnable to use device

## 2015-04-19 NOTE — Progress Notes (Signed)
Initial Nutrition Assessment  DOCUMENTATION CODES:   Severe malnutrition in context of chronic illness  INTERVENTION:  Continue Boost Breeze po TID, each supplement provides 250 kcal and 9 grams of protein.  Encourage adequate PO intake.  NUTRITION DIAGNOSIS:   Malnutrition related to chronic illness as evidenced by energy intake < or equal to 75% for > or equal to 1 month, severe depletion of muscle mass.  GOAL:   Patient will meet greater than or equal to 90% of their needs  MONITOR:   PO intake, Supplement acceptance, Weight trends, Labs, I & O's  REASON FOR ASSESSMENT:   Consult Assessment of nutrition requirement/status  ASSESSMENT:   81 year old female patient with history of severe GERD and prior gastric surgical correction, hypertension, thyroid disease with noncompliance with Synthroid, protein calorie malnutrition (chronic), recurrent mechanical falls due to gait imbalance and history of prior surgeries to the right hip present with fall.  Plain films reveal acute right iliac wing fracture.   Plans for non-operative approach. Pt reports appetite is fine, however has not been able to eat much at meals due to acid reflux. Current meal completion is 50%. PTA pt reports having small portions at meals due to GERD. Usual body weight reported to be 95-100 lbs. Noted no new weight recorded. Pt was weighed on bed scale during time of visit, which revealed 96 lbs. Pt currently has Boost Breeze ordered and has been consuming them. Pt reports PTA she drinks Ensure on occasion. Pt was offered Ensure, however refused. RD to continue with current orders.   Nutrition-Focused physical exam completed. Findings are no fat depletion, severe muscle depletion, and no edema.   Labs and medications reviewed.   Diet Order:  Diet regular Room service appropriate?: Yes; Fluid consistency:: Thin  Skin:  Reviewed, no issues  Last BM:  8/7  Height:   Ht Readings from Last 1 Encounters:   02/22/15  (1.473 m)    Weight:   Wt Readings from Last 1 Encounters:  04/19/15 96 lb (43.545 kg)    Ideal Body Weight:  43.7 kg  BMI:  Body mass index is 20.07 kg/(m^2).  Estimated Nutritional Needs:   Kcal:  1300-1500  Protein:  55-65 grams  Fluid:  >/= 1.5 L/day  EDUCATION NEEDS:   No education needs identified at this time  Roslyn Smiling, MS, RD, LDN Pager # [phone] After hours/ weekend pager # [phone]

## 2015-04-19 NOTE — Evaluation (Signed)
Physical Therapy Evaluation Patient Details Name: Katherine Flynn MRN: [id number] DOB: 1923/09/04 Today's Date: 04/19/2015   History of Present Illness  right iliac wing fracture  Clinical Impression  Patient requiring significant assistance for transfers during session. Patient unable to assist with 50% WB during transfers due to poor upper extremity strength. Able to perform transfers with stand pivot technique and max assistance. Patient agreeable to mobilization and motivated but anticipate significant rehabilitation before returning home. Patient in agreement.     Follow Up Recommendations SNF    Equipment Recommendations  None recommended by PT (patient reports having walker at home. )    Recommendations for Other Services       Precautions / Restrictions Precautions Precautions: Fall Restrictions Weight Bearing Restrictions: Yes RLE Weight Bearing: Partial weight bearing RLE Partial Weight Bearing Percentage or Pounds: 50      Mobility  Bed Mobility Overal bed mobility: Needs Assistance Bed Mobility: Supine to Sit     Supine to sit: Mod assist        Transfers Overall transfer level: Needs assistance Equipment used: Rolling walker (2 wheeled) Transfers: Sit to/from Stand Sit to Stand: Mod assist Stand pivot transfers: Max assist       General transfer comment: bed to [facility] to chair - pivoting on LLE, unable to use UEs to [employer] RLE for steps  Ambulation/Gait                Stairs            Wheelchair Mobility    Modified Rankin (Stroke Patients Only)       Balance Overall balance assessment: Needs assistance Sitting-balance support: Bilateral upper extremity supported Sitting balance-Leahy Scale: Poor     Standing balance support: Bilateral upper extremity supported Standing balance-Leahy Scale: Poor                               Pertinent Vitals/Pain Pain Assessment: 0-10 Pain Score: 8  Pain Location: Rt  pelvis Pain Descriptors / Indicators: Aching Pain Intervention(s): Monitored during session;Repositioned    Home Living Family/patient expects to be discharged to:: Skilled nursing facility                 Additional Comments: Patient lives alone but states she will need more rehab before going home. Has 2 stairs into her home with railing.    Prior Function Level of Independence: Independent with assistive device(s)   Gait / Transfers Assistance Needed: walker     Comments: Had neighbor who would check in occasionally     Hand Dominance        Extremity/Trunk Assessment               Lower Extremity Assessment: Generalized weakness         Communication   Communication: No difficulties  Cognition Arousal/Alertness: Awake/alert Behavior During Therapy: WFL for tasks assessed/performed Overall Cognitive Status: Within Functional Limits for tasks assessed                      General Comments      Exercises        Assessment/Plan    PT Assessment Patient needs continued PT services  PT Diagnosis Difficulty walking;Generalized weakness   PT Problem List Decreased strength;Decreased range of motion;Decreased activity tolerance;Decreased balance;Decreased mobility;Decreased knowledge of use of DME;Pain  PT Treatment Interventions Gait training;DME instruction;Stair training;Functional mobility training;Therapeutic activities;Therapeutic exercise;Patient/family education  PT Goals (Current goals can be found in the Care Plan section) Acute Rehab PT Goals Patient Stated Goal: Eventually return home PT Goal Formulation: With patient Time For Goal Achievement: 05/03/15 Potential to Achieve Goals: Fair    Frequency Min 3X/week   Barriers to discharge        Co-evaluation               End of Session Equipment Utilized During Treatment: Gait belt Activity Tolerance: Patient tolerated treatment well Patient left: in chair;with call  bell/phone within reach;with family/visitor present Nurse Communication: Mobility status;Weight bearing status         Time: 4098-1191 PT Time Calculation (min) (ACUTE ONLY): 28 min   Charges:   PT Evaluation $Initial PT Evaluation Tier I: 1 Procedure PT Treatments $Therapeutic Activity: 8-22 mins   PT G Codes:        Christiane Ha, PT, CSCS Pager [phone] Office [phone]  04/19/2015, 9:54 AM

## 2015-04-20 ENCOUNTER — Observation Stay (HOSPITAL_COMMUNITY): Payer: Commercial Managed Care - HMO

## 2015-04-20 DIAGNOSIS — J449 Chronic obstructive pulmonary disease, unspecified: Secondary | ICD-10-CM | POA: Diagnosis not present

## 2015-04-20 DIAGNOSIS — S32391A Other fracture of right ilium, initial encounter for closed fracture: Secondary | ICD-10-CM | POA: Diagnosis not present

## 2015-04-20 DIAGNOSIS — T84040A Periprosthetic fracture around internal prosthetic right hip joint, initial encounter: Secondary | ICD-10-CM | POA: Diagnosis not present

## 2015-04-20 DIAGNOSIS — E038 Other specified hypothyroidism: Secondary | ICD-10-CM | POA: Diagnosis not present

## 2015-04-20 DIAGNOSIS — G894 Chronic pain syndrome: Secondary | ICD-10-CM | POA: Diagnosis not present

## 2015-04-20 DIAGNOSIS — I10 Essential (primary) hypertension: Secondary | ICD-10-CM | POA: Diagnosis not present

## 2015-04-20 DIAGNOSIS — T84048D Periprosthetic fracture around other internal prosthetic joint, subsequent encounter: Secondary | ICD-10-CM | POA: Diagnosis not present

## 2015-04-20 DIAGNOSIS — H3530 Unspecified macular degeneration: Secondary | ICD-10-CM | POA: Diagnosis not present

## 2015-04-20 DIAGNOSIS — E43 Unspecified severe protein-calorie malnutrition: Secondary | ICD-10-CM

## 2015-04-20 DIAGNOSIS — S32391S Other fracture of right ilium, sequela: Secondary | ICD-10-CM | POA: Diagnosis not present

## 2015-04-20 DIAGNOSIS — D509 Iron deficiency anemia, unspecified: Secondary | ICD-10-CM | POA: Diagnosis not present

## 2015-04-20 DIAGNOSIS — Z66 Do not resuscitate: Secondary | ICD-10-CM | POA: Diagnosis not present

## 2015-04-20 DIAGNOSIS — E039 Hypothyroidism, unspecified: Secondary | ICD-10-CM | POA: Diagnosis not present

## 2015-04-20 DIAGNOSIS — R509 Fever, unspecified: Secondary | ICD-10-CM | POA: Diagnosis not present

## 2015-04-20 LAB — URINE MICROSCOPIC-ADD ON

## 2015-04-20 LAB — HEMOGLOBIN A1C
Hgb A1c MFr Bld: 5.6 % (ref 4.8–5.6)
Mean Plasma Glucose: 114 mg/dL

## 2015-04-20 LAB — URINALYSIS, ROUTINE W REFLEX MICROSCOPIC
BILIRUBIN URINE: NEGATIVE
Glucose, UA: NEGATIVE mg/dL
Ketones, ur: 15 mg/dL — AB
Nitrite: POSITIVE — AB
PH: 5 (ref 5.0–8.0)
PROTEIN: NEGATIVE mg/dL
Specific Gravity, Urine: 1.02 (ref 1.005–1.030)
Urobilinogen, UA: 0.2 mg/dL (ref 0.0–1.0)

## 2015-04-20 MED ORDER — ACETAMINOPHEN 325 MG PO TABS
650.0000 mg | ORAL_TABLET | Freq: Four times a day (QID) | ORAL | Status: DC | PRN
Start: 2015-04-20 — End: 2015-04-21
  Administered 2015-04-20: 650 mg via ORAL
  Filled 2015-04-20: qty 2

## 2015-04-20 MED ORDER — DEXTROSE 5 % IV SOLN
1.0000 g | INTRAVENOUS | Status: DC
  Administered 2015-04-20: 1 g via INTRAVENOUS
  Filled 2015-04-20 (×2): qty 10

## 2015-04-20 NOTE — Care Management Note (Signed)
Case Management Note  Patient Details  Name: Katherine Flynn MRN: [id number] Date of Birth: 06-Jan-1923  Subjective/Objective:  81 yr old female s/p fall with right iliac wing fracture, right prosthetic fracture * Patient has developed a fever, started on IV Rocephin, CXR ordered and cultures.   Action/Plan:  Patient will need shortterm rehab at [facility]. Social worker is aware.    Expected Discharge Date:    04/21/15                 Expected Discharge Plan:   Skilled Nursing Facility  In-House Referral:  Clinical Social Work  Discharge planning Services     Post Acute Care Choice:    Choice offered to:  NA  DME Arranged:  N/A DME Agency:  NA, [facility]  HH Arranged:  NA HH Agency:  NA  Status of Service:  Completed, signed off  Medicare Important Message Given:    Date Medicare IM Given:    Medicare IM give by:    Date Additional Medicare IM Given:    Additional Medicare Important Message give by:     If discussed at Long Length of Stay Meetings, dates discussed:    Additional Comments:  Durenda Guthrie, RN 04/20/2015, 2:27 PM

## 2015-04-20 NOTE — Clinical Social Work Note (Signed)
Clinical Social Work Assessment  Patient Details  Name: Katherine Flynn MRN: [id number] Date of Birth: 01-15-1923  Date of referral:  04/20/15               Reason for consult:  Facility Placement, Discharge Planning                Permission sought to share information with:  Facility Sport and exercise psychologist, Family Supports Permission granted to share information::  Yes, Verbal Permission Granted  Name::     Doretha Sou  Agency::  [facility]  Relationship::  Daughter  Contact Information:  [phone] or [phone]  Housing/Transportation Living arrangements for the past 2 months:  [place] of Information:  Patient, Adult Children Patient Interpreter Needed:  None Criminal Activity/Legal Involvement Pertinent to Current Situation/Hospitalization:  No - Comment as needed Significant Relationships:  Adult Children Lives with:  Self Do you feel safe going back to the place where you live?  No (High fall risk.) Need for family participation in patient care:  Yes (Comment) (Patient's daughter active in patient's care.)  Care giving concerns:  Patient nor patient's daughter expressed any concerns at this time.   Social Worker assessment / plan: CSW received referral for possible SNF placement at time of discharge. CSW met with patient and patient's daughter to discuss discharge disposition. Patient's daughter informed CSW that patient is from home alone with patient's children "checking in" on patient. Per patient's daughter, patient's preference for SNF placement is with [facility] in [place]. CSW to continue to follow and assist with discharge planning needs.  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  Patient/Family's Understanding of and Emotional Response to Diagnosis, Current Treatment, and Prognosis:  Patient understanding and agreeable to CSW plan of care.  Emotional Assessment Appearance:  Appears stated age Attitude/Demeanor/Rapport:  Lethargic Affect (typically observed):  Accepting, Appropriate, Pleasant Orientation:  Oriented to Self, Oriented to Place, Oriented to  Time, Oriented to Situation Alcohol / Substance use:  Not Applicable Psych involvement (Current and /or in the community):  No (Comment) (Not appropriate on this admission.)  Discharge Needs  Concerns to be addressed:  No discharge needs identified Readmission within the last 30 days:  No Current discharge risk:  None Barriers to Discharge:  No Barriers Identified   Caroline Sauger, LCSW 04/20/2015, 4:19 PM [phone]

## 2015-04-20 NOTE — Progress Notes (Signed)
Subjective: Patient reports continued pain in her right hip. She is attempted to work with physical therapy but has upper extremity weakness, this combined with the iliac wing fracture requires maximum assist to even get out of bed with a pivot. I have reviewed her x-rays and it should be noted that the greater trochanter fracture is old and from the year 2014, the lesser trochanteric fracture did not destabilize the stem and her main issue is the right iliac wing fracture which of course carries the origin of the hip abductors making ambulation difficult.   Objective: Vital signs in last 24 hours: Temp:  [97.7 F (36.5 C)-99.7 F (37.6 C)] 99.7 F (37.6 C) (08/09 0500) Pulse Rate:  [80-106] 80 (08/09 0500) Resp:  [16-20] 20 (08/09 0500) BP: (100-181)/(77-84) 100/77 mmHg (08/09 0500) SpO2:  [93 %-96 %] 96 % (08/09 0500) Weight:  [43.545 kg (96 lb)] 43.545 kg (96 lb) (08/08 1223)  Intake/Output from previous day: 08/08 0701 - 08/09 0700 In: 460 [P.O.:460] Out: -  Intake/Output this shift:     Recent Labs  04/18/15 1218  HGB 11.5*    Recent Labs  04/18/15 1218  WBC 9.1  RBC 3.87  HCT 35.1*  PLT 336    Recent Labs  04/18/15 1218  NA 141  K 4.0  CL 106  CO2 26  BUN 12  CREATININE 0.74  GLUCOSE 129*  CALCIUM 9.9   No results for input(s): LABPT, INR in the last 72 hours.  Neurologically intact ABD soft Neurovascular intact Sensation intact distally Intact pulses distally Dorsiflexion/Plantar flexion intact No cellulitis present  Assessment/Plan: Assessment: Main issue orthopedically is a right iliac wing fracture which will weaken the right hip significantly and will take 6-8 weeks to heal. The lesser trochanteric fracture did not destabilize the stem and will be treated simply with observation. There is no surgery indicated for either fracture.  Plan: For the iliac wing fracture she will be partial weightbearing for 6-8 weeks. Lovenox for 2 weeks for DVT  prophylaxis is certainly reasonable. For pain control she is tolerating the 5 mg oxycodone's and that is reasonable going forward. She will need placement in a skilled nursing facility probably for 6-8 weeks. I agree that she is protein malnourished and will need calories going forward for rehabilitation.   Karalyn Kadel J 04/20/2015, 7:17 AM

## 2015-04-20 NOTE — Progress Notes (Signed)
Occupational Therapy Evaluation Patient Details Name: NINETTE COTTA MRN: [id number] DOB: 1923-02-18 Today's Date: 04/20/2015    History of Present Illness right iliac wing fracture   Clinical Impression   Pt admitted with the above diagnoses and presents with below problem list. Pt will benefit from continued acute OT to address the below listed deficits and maximize independence with BADLs prior to d/c to venue below. PTA pt was mod I with ADLs. Pt is currently min -mod +2 physical assist with ADLs due to decreased BUE strength. OT to continue to follow acutely. Recommend SNF for rehab at d/c prior to returning home.      Follow Up Recommendations  SNF    Equipment Recommendations  Other (comment) (defer to next venue)    Recommendations for Other Services       Precautions / Restrictions Precautions Precautions: Fall Restrictions Weight Bearing Restrictions: Yes RLE Weight Bearing: Partial weight bearing RLE Partial Weight Bearing Percentage or Pounds: 50      Mobility Bed Mobility Overal bed mobility: Needs Assistance Bed Mobility: Supine to Sit     Supine to sit: Max assist;HOB elevated     General bed mobility comments: Max A and cues for supine>EOB with HOB elevated. Use of pad to scoot pt forward to complete EOB with pt unable to utilize BUE strength to compensate much.  Transfers Overall transfer level: Needs assistance Equipment used: Rolling walker (2 wheeled) Transfers: Sit to/from [employer] Sit to Stand: Max assist;From elevated surface Stand pivot transfers: Min assist;+2 physical assistance;From elevated surface       General transfer comment: EOB>recliner. BUE weakness impacting level of assist. Pt fatigues quickly.    Balance Overall balance assessment: Needs assistance;History of Falls Sitting-balance support: Bilateral upper extremity supported;Feet supported Sitting balance-Leahy Scale: Poor     Standing balance  support: Bilateral upper extremity supported;During functional activity Standing balance-Leahy Scale: Poor Standing balance comment: max A and cues for upright posture and PWB status                            ADL Overall ADL's : Needs assistance/impaired Eating/Feeding: Set up;Sitting Eating/Feeding Details (indicate cue type and reason): supported sitting Grooming: Set up;Sitting Grooming Details (indicate cue type and reason): supported sitting Upper Body Bathing: Sitting;Minimal assitance Upper Body Bathing Details (indicate cue type and reason): supported sitting Lower Body Bathing: +2 for physical assistance;Moderate assistance;Sit to/from stand Lower Body Bathing Details (indicate cue type and reason): min A +2 for sit<>stand; mod A for task completion Upper Body Dressing : Sitting;Minimal assistance   Lower Body Dressing: Moderate assistance;+2 for physical assistance;Sit to/from stand Lower Body Dressing Details (indicate cue type and reason): min A +2 for sit<>stand; mod A for task completion Toilet Transfer: Minimal assistance;+2 for physical assistance;Stand-pivot;BSC;RW   Toileting- Clothing Manipulation and Hygiene: Moderate assistance;Sitting/lateral lean         General ADL Comments: Pt presents with acute pain in left hip, 50% PWB RLE, generalized weakness, and impaired cognition (meds? )impacting level of assist with ADLs.     Vision     Perception     Praxis      Pertinent Vitals/Pain Pain Assessment: Faces Faces Pain Scale: Hurts whole lot Pain Location: Rt pelvis with movement Pain Descriptors / Indicators: Grimacing;Guarding Pain Intervention(s): Limited activity within patient's tolerance;Monitored during session;Premedicated before session;Repositioned;Utilized relaxation techniques     Hand Dominance Left   Extremity/Trunk Assessment Upper Extremity Assessment Upper Extremity  Assessment: RUE deficits/detail;Generalized weakness RUE  Deficits / Details: unable to complete shoulder flexion past ~90 degrees at baseline   Lower Extremity Assessment Lower Extremity Assessment: Defer to PT evaluation       Communication Communication Communication: No difficulties   Cognition Arousal/Alertness: Awake/alert Behavior During Therapy: WFL for tasks assessed/performed Overall Cognitive Status: Impaired/Different from baseline Area of Impairment: Memory;Orientation;Following commands;Safety/judgement;Problem solving Orientation Level: Place (cue to correctly identify place,home vs hospital? "hospital")   Memory: Decreased short-term memory;Decreased recall of precautions Following Commands: Follows one step commands with increased time Safety/Judgement: Decreased awareness of safety   Problem Solving: Slow processing;Decreased initiation;Difficulty sequencing;Requires verbal cues;Requires tactile cues General Comments: suspect due to meds. Daughter present and states this is not pt's baseline.    General Comments       Exercises       Shoulder Instructions      Home Living Family/patient expects to be discharged to:: Skilled nursing facility                                        Prior Functioning/Environment Level of Independence: Independent with assistive device(s)  Gait / Transfers Assistance Needed: walker     Comments: Had neighbor who would check in occasionally    OT Diagnosis: Generalized weakness;Acute pain;Cognitive deficits   OT Problem List: Decreased strength;Decreased range of motion;Decreased activity tolerance;Impaired balance (sitting and/or standing);Decreased cognition;Decreased safety awareness;Decreased knowledge of use of DME or AE;Decreased knowledge of precautions;Pain   OT Treatment/Interventions: Self-care/ADL training;Therapeutic exercise;Energy conservation;DME and/or AE instruction;Therapeutic activities;Cognitive remediation/compensation;Patient/family  education;Balance training    OT Goals(Current goals can be found in the care plan section) Acute Rehab OT Goals Patient Stated Goal: Eventually return home OT Goal Formulation: With patient/family Time For Goal Achievement: 04/27/15 Potential to Achieve Goals: Good ADL Goals Pt Will Perform Upper Body Bathing: with modified independence;sitting Pt Will Perform Lower Body Bathing: with mod assist;with adaptive equipment;sit to/from stand Pt Will Perform Upper Body Dressing: sitting;with modified independence Pt Will Perform Lower Body Dressing: with mod assist;with adaptive equipment;sit to/from stand Pt Will Transfer to Toilet: with mod assist;ambulating;bedside commode Pt Will Perform Toileting - Clothing Manipulation and hygiene: with mod assist;sit to/from stand Pt/caregiver will Perform Home Exercise Program: Increased strength;Both right and left upper extremity;With theraband;With written HEP provided Additional ADL Goal #1: Pt will complete bed mobility at min A level to prepare for OOB ADLs.  OT Frequency: Min 2X/week   Barriers to D/C:            Co-evaluation              End of Session Equipment Utilized During Treatment: Gait belt;Rolling walker Nurse Communication: Other (comment) (cognition)  Activity Tolerance: Patient limited by fatigue;Patient limited by pain Patient left: in chair;with call bell/phone within reach;with chair alarm set;with family/visitor present   Time: 1421-1451 OT Time Calculation (min): 30 min Charges:  OT General Charges $OT Visit: 1 Procedure OT Evaluation $Initial OT Evaluation Tier I: 1 Procedure OT Treatments $Self Care/Home Management : 8-22 mins G-Codes: OT G-codes **NOT FOR INPATIENT CLASS** Functional Assessment Tool Used: clinical judgement Functional Limitation: Self care Self Care Current Status (Z6109): At least 60 percent but less than 80 percent impaired, limited or restricted Self Care Goal Status (U0454): At  least 40 percent but less than 60 percent impaired, limited or restricted  Pilar Grammes 04/20/2015, 3:20 PM

## 2015-04-20 NOTE — Progress Notes (Signed)
Patient Demographics:    Katherine Flynn, is a 81 y.o. female, DOB - 1923-04-21, ZOX:[id number]  Admit date - 04/18/2015   Admitting Physician Renae Fickle, MD  Outpatient Primary MD for the patient is Katherine Boga, MD  LOS - 2   Chief Complaint  Patient presents with  . Fall  . Hip Pain        Subjective:    [name] today has, No headache, No chest pain, No abdominal pain - No Nausea, No new weakness tingling or numbness, No Cough - SOB. Continues to have mild right-sided pelvic pain.   Assessment  & Plan :     1. Mechanical fall with closed right-sided  iliac wing and right-sided periprosthetic hip fracture. Seen by orthopedics, 50% weightbearing on the right side, PT, the new Lovenox for DVT prophylaxis. Will require SNF social work requested to evaluate for placement.   2. Hypothyroidism. TSH stable, continue home dose Synthroid.   3. Essential hypertension. Home dose lisinopril continue.   4. Iron deficiency anemia. No acute issues outpatient follow-up with PCP    5. Moderate protein calorie malnutrition. Protein supplementation per dietitian.    6. Likely stress related hyperglycemia upon admission. A1c of 5.6.  Lab Results  Component Value Date   HGBA1C 5.6 04/19/2015       Code Status : DO NOT RESUSCITATE  Family Communication  : None present  Disposition Plan  : SNF  Consults  :  Orthopedics  Procedures  :    DVT Prophylaxis  :  Lovenox    Lab Results  Component Value Date   PLT 336 04/18/2015    Inpatient Medications  Scheduled Meds: . docusate sodium  100 mg Oral BID  . enoxaparin (LOVENOX) injection  30 mg Subcutaneous Q24H  . feeding supplement  1 Container Oral TID BM  . levothyroxine  75 mcg Oral QAC breakfast  .  lisinopril  20 mg Oral Daily  . metoprolol tartrate  25 mg Oral BID   Continuous Infusions:   PRN Meds:.methocarbamol **OR** methocarbamol (ROBAXIN)  IV, morphine injection, ondansetron (ZOFRAN) IV, oxyCODONE, polyethylene glycol  Antibiotics  :     Anti-infectives    None        Objective:   Filed Vitals:   04/19/15 1500 04/19/15 2046 04/20/15 0500 04/20/15 0940  BP: 167/78 181/84 100/77 133/51  Pulse: 106 97 80   Temp: 98.3 F (36.8 C) 97.7 F (36.5 C) 99.7 F (37.6 C)   TempSrc: Oral  Oral   Resp: Weight:      SpO2: 93% 95% 96%     Wt Readings from Last 3 Encounters:  04/19/15 43.545 kg (96 lb)  02/22/15 43.999 kg (97 lb)  11/24/14 42.638 kg (94 lb)     Intake/Output Summary (Last 24 hours) at 04/20/15 1014 Last data filed at 04/20/15 0500  Gross per 24 hour  Intake    460 ml  Output      0 ml  Net    460 ml     Physical Exam  Awake Alert, Oriented X 3, No new F.N deficits, Normal affect [place].AT,PERRAL Supple Neck,No JVD, No cervical lymphadenopathy appriciated.  Symmetrical Chest wall movement, Good air movement bilaterally,  CTAB RRR,No Gallops,Rubs or new Murmurs, No Parasternal Heave +ve B.Sounds, Abd Soft, No tenderness, No organomegaly appriciated, No rebound - guarding or rigidity. No Cyanosis, Clubbing or edema, No new Rash or bruise      Data Review:   Micro Results No results found for this or any previous visit (from the past 240 hour(s)).  Radiology Reports Dg Hip Unilat  With Pelvis 2-3 Views Right  04/18/2015   CLINICAL DATA:  81 year old female with a history of fall at 4 a.m. Right hip pain.  EXAM: DG HIP (WITH OR WITHOUT PELVIS) 2-3V RIGHT  COMPARISON:  CT abdomen 07/05/2005.  FINDINGS: Compare to the prior CT scout image and CT, there has been interval right iliac wing fracture with minimal displacement.  Surgical changes of right hip arthroplasty with perihardware fracture and superior displacement of the superior fracture  fragment. Fracture line extends medially to involve the lesser trochanter. Visualized femoral diaphysis is unremarkable. Components appear congruent.  Diffuse osteopenia.  Extensive vascular calcifications including aortic, iliac, and femoral disease.  Visualized proximal left femur unremarkable.  Degenerative changes of the spine.  The contours of the sacrum and left pelvis appear maintained.  IMPRESSION: Acute right iliac wing fracture with minimal displacement.  Acute perihardware fracture of right hip arthroplasty, with displacement of the superior fracture fragment and the fracture line extending to involve the lesser trochanter.  Extensive atherosclerosis.  Signed,  Yvone Neu. Loreta Ave, DO  [facility]  [facility]   Electronically Signed   By: Gilmer Mor D.O.   On: 04/18/2015 12:57     CBC  Recent Labs Lab 04/18/15 1218  WBC 9.1  HGB 11.5*  HCT 35.1*  PLT 336  MCV 90.7  MCH 29.7  MCHC 32.8  RDW 13.7  LYMPHSABS 1.5  MONOABS 0.6  EOSABS 0.1  BASOSABS 0.0    Chemistries   Recent Labs Lab 04/18/15 1218  NA 141  K 4.0  CL 106  CO2 26  GLUCOSE 129*  BUN 12  CREATININE 0.74  CALCIUM 9.9   ------------------------------------------------------------------------------------------------------------------ estimated creatinine clearance is 30.8 mL/min (by C-G formula based on Cr of 0.74). ------------------------------------------------------------------------------------------------------------------  Recent Labs  04/19/15 0610  HGBA1C 5.6   ------------------------------------------------------------------------------------------------------------------ No results for input(s): CHOL, HDL, LDLCALC, TRIG, CHOLHDL, LDLDIRECT in the last 72 hours. ------------------------------------------------------------------------------------------------------------------  Recent Labs  04/19/15 0610  TSH 4.561*    ------------------------------------------------------------------------------------------------------------------ No results for input(s): VITAMINB12, FOLATE, FERRITIN, TIBC, IRON, RETICCTPCT in the last 72 hours.  Coagulation profile No results for input(s): INR, PROTIME in the last 168 hours.  No results for input(s): DDIMER in the last 72 hours.  Cardiac Enzymes No results for input(s): CKMB, TROPONINI, MYOGLOBIN in the last 168 hours.  Invalid input(s): CK ------------------------------------------------------------------------------------------------------------------ Invalid input(s): POCBNP   Time Spent in minutes 35   Elenie Coven K M.D on 04/20/2015 at 10:14 AM  Between 7am to 7pm - Pager - [phone]  After 7pm go to [email or web address] - password [email or web address]  [facility] -  Office  [phone]

## 2015-04-20 NOTE — Clinical Social Work Placement (Signed)
   CLINICAL SOCIAL WORK PLACEMENT  NOTE  Date:  04/20/2015  Patient Details  Name: Katherine Flynn MRN: [id number] Date of Birth: 1923/07/07  Clinical Social Work is seeking post-discharge placement for this patient at the Skilled  Nursing Facility level of care (*CSW will initial, date and re-position this form in  chart as items are completed):  Yes   Patient/family provided with [place] Clinical Social Work Department's list of facilities offering this level of care within the geographic area requested by the patient (or if unable, by the patient's family).  Yes   Patient/family informed of their freedom to choose among providers that offer the needed level of care, that participate in Medicare, Medicaid or managed care program needed by the patient, have an available bed and are willing to accept the patient.  Yes   Patient/family informed of [facility]'s ownership interest in [facility] and [facility], as well as of the fact that they are under no obligation to receive care at these facilities.  PASRR submitted to EDS on 04/20/15     PASRR number received on 04/20/15     Existing PASRR number confirmed on  (n/a)     FL2 transmitted to all facilities in geographic area requested by pt/family on 04/20/15     FL2 transmitted to all facilities within larger geographic area on  (n/a)     Patient informed that his/her managed care company has contracts with or will negotiate with certain facilities, including the following:   (yes, [facility])         Patient/family informed of bed offers received.  Patient chooses bed at       Physician recommends and patient chooses bed at      Patient to be transferred to   on  .  Patient to be transferred to facility by       Patient family notified on   of transfer.  Name of family member notified:        PHYSICIAN Please sign FL2     Additional Comment:     _______________________________________________ Rod Mae, LCSW 04/20/2015, 4:26 PM [phone]

## 2015-04-21 DIAGNOSIS — E039 Hypothyroidism, unspecified: Secondary | ICD-10-CM | POA: Diagnosis not present

## 2015-04-21 DIAGNOSIS — Z9181 History of falling: Secondary | ICD-10-CM | POA: Diagnosis not present

## 2015-04-21 DIAGNOSIS — E038 Other specified hypothyroidism: Secondary | ICD-10-CM | POA: Diagnosis not present

## 2015-04-21 DIAGNOSIS — Z66 Do not resuscitate: Secondary | ICD-10-CM | POA: Diagnosis not present

## 2015-04-21 DIAGNOSIS — S32301D Unspecified fracture of right ilium, subsequent encounter for fracture with routine healing: Secondary | ICD-10-CM | POA: Diagnosis not present

## 2015-04-21 DIAGNOSIS — T84040A Periprosthetic fracture around internal prosthetic right hip joint, initial encounter: Secondary | ICD-10-CM | POA: Diagnosis not present

## 2015-04-21 DIAGNOSIS — R102 Pelvic and perineal pain: Secondary | ICD-10-CM | POA: Diagnosis not present

## 2015-04-21 DIAGNOSIS — S32391S Other fracture of right ilium, sequela: Secondary | ICD-10-CM | POA: Diagnosis not present

## 2015-04-21 DIAGNOSIS — J449 Chronic obstructive pulmonary disease, unspecified: Secondary | ICD-10-CM | POA: Diagnosis not present

## 2015-04-21 DIAGNOSIS — Z96641 Presence of right artificial hip joint: Secondary | ICD-10-CM | POA: Diagnosis not present

## 2015-04-21 DIAGNOSIS — I10 Essential (primary) hypertension: Secondary | ICD-10-CM | POA: Diagnosis not present

## 2015-04-21 DIAGNOSIS — G894 Chronic pain syndrome: Secondary | ICD-10-CM | POA: Diagnosis not present

## 2015-04-21 DIAGNOSIS — T84048D Periprosthetic fracture around other internal prosthetic joint, subsequent encounter: Secondary | ICD-10-CM | POA: Diagnosis not present

## 2015-04-21 DIAGNOSIS — R2689 Other abnormalities of gait and mobility: Secondary | ICD-10-CM | POA: Diagnosis not present

## 2015-04-21 DIAGNOSIS — R634 Abnormal weight loss: Secondary | ICD-10-CM | POA: Diagnosis not present

## 2015-04-21 DIAGNOSIS — E43 Unspecified severe protein-calorie malnutrition: Secondary | ICD-10-CM | POA: Diagnosis not present

## 2015-04-21 DIAGNOSIS — E440 Moderate protein-calorie malnutrition: Secondary | ICD-10-CM | POA: Diagnosis not present

## 2015-04-21 DIAGNOSIS — D649 Anemia, unspecified: Secondary | ICD-10-CM | POA: Diagnosis not present

## 2015-04-21 DIAGNOSIS — S32810D Multiple fractures of pelvis with stable disruption of pelvic ring, subsequent encounter for fracture with routine healing: Secondary | ICD-10-CM | POA: Diagnosis not present

## 2015-04-21 DIAGNOSIS — H3530 Unspecified macular degeneration: Secondary | ICD-10-CM | POA: Diagnosis not present

## 2015-04-21 DIAGNOSIS — D509 Iron deficiency anemia, unspecified: Secondary | ICD-10-CM | POA: Diagnosis not present

## 2015-04-21 DIAGNOSIS — K219 Gastro-esophageal reflux disease without esophagitis: Secondary | ICD-10-CM | POA: Diagnosis not present

## 2015-04-21 DIAGNOSIS — S329XXA Fracture of unspecified parts of lumbosacral spine and pelvis, initial encounter for closed fracture: Secondary | ICD-10-CM | POA: Diagnosis not present

## 2015-04-21 DIAGNOSIS — S32391A Other fracture of right ilium, initial encounter for closed fracture: Secondary | ICD-10-CM | POA: Diagnosis not present

## 2015-04-21 DIAGNOSIS — T84040D Periprosthetic fracture around internal prosthetic right hip joint, subsequent encounter: Secondary | ICD-10-CM | POA: Diagnosis not present

## 2015-04-21 MED ORDER — OXYCODONE HCL 5 MG PO TABS: 5.0000 mg | ORAL_TABLET | ORAL | Status: DC | PRN

## 2015-04-21 MED ORDER — METOPROLOL TARTRATE 25 MG PO TABS: 25.0000 mg | ORAL_TABLET | Freq: Two times a day (BID) | ORAL | Status: DC

## 2015-04-21 MED ORDER — ENOXAPARIN SODIUM 30 MG/0.3ML ~~LOC~~ SOLN: 30.0000 mg | SUBCUTANEOUS | Status: DC

## 2015-04-21 MED ORDER — DOCUSATE SODIUM 100 MG PO CAPS: 100.0000 mg | ORAL_CAPSULE | Freq: Two times a day (BID) | ORAL | Status: DC

## 2015-04-21 MED ORDER — DEXTROSE 5 % IV SOLN
1.0000 g | INTRAVENOUS | Status: DC
  Administered 2015-04-21: 1 g via INTRAVENOUS
  Filled 2015-04-21: qty 10

## 2015-04-21 MED ORDER — CEFPODOXIME PROXETIL 200 MG PO TABS: 200.0000 mg | ORAL_TABLET | Freq: Two times a day (BID) | ORAL | Status: DC

## 2015-04-21 NOTE — Progress Notes (Signed)
Katherine Flynn discharged to SNF ([facility]) per MD order. All questions and concerns answered. Copy of instructions and scripts sent with patient. IV removed.  Patient escorted by [facility]. No distress noted upon discharge.   Dennard Nip R 04/21/2015 11:50 AM

## 2015-04-21 NOTE — Discharge Instructions (Signed)
Follow with Primary MD Rogelia Boga, MD in 7 days   Get CBC, CMP, UA, 2 view Chest X ray checked  by Primary MD next visit.    Activity: 50% weight bearing on the Right leg with Full fall precautions use walker/cane & assistance as needed   Disposition SNF   Diet: Heart Healthy with feeding assistance and aspiration precautions.  For Heart failure patients - Check your Weight same time everyday, if you gain over 2 pounds, or you develop in leg swelling, experience more shortness of breath or chest pain, call your Primary MD immediately. Follow Cardiac Low Salt Diet and 1.5 lit/day fluid restriction.   On your next visit with your primary care physician please Get Medicines reviewed and adjusted.   Please request your Prim.MD to go over all Hospital Tests and Procedure/Radiological results at the follow up, please get all Hospital records sent to your Prim MD by signing hospital release before you go home.   If you experience worsening of your admission symptoms, develop shortness of breath, life threatening emergency, suicidal or homicidal thoughts you must seek medical attention immediately by calling 911 or calling your MD immediately  if symptoms less severe.  You Must read complete instructions/literature along with all the possible adverse reactions/side effects for all the Medicines you take and that have been prescribed to you. Take any new Medicines after you have completely understood and accpet all the possible adverse reactions/side effects.   Do not drive, operating heavy machinery, perform activities at heights, swimming or participation in water activities or provide baby sitting services if your were admitted for syncope or siezures until you have seen by Primary MD or a Neurologist and advised to do so again.  Do not drive when taking Pain medications.    Do not take more than prescribed Pain, Sleep and Anxiety Medications  Special Instructions: If you have  smoked or chewed Tobacco  in the last 2 yrs please stop smoking, stop any regular Alcohol  and or any Recreational drug use.  Wear Seat belts while driving.   Please note  You were cared for by a hospitalist during your hospital stay. If you have any questions about your discharge medications or the care you received while you were in the hospital after you are discharged, you can call the unit and asked to speak with the hospitalist on call if the hospitalist that took care of you is not available. Once you are discharged, your primary care physician will handle any further medical issues. Please note that NO REFILLS for any discharge medications will be authorized once you are discharged, as it is imperative that you return to your primary care physician (or establish a relationship with a primary care physician if you do not have one) for your aftercare needs so that they can reassess your need for medications and monitor your lab values.

## 2015-04-21 NOTE — Discharge Summary (Addendum)
Katherine Flynn, is a 81 y.o. female  DOB 1922/10/27  MRN [id number].  Admission date:  04/18/2015  Admitting Physician  Renae Fickle, MD  Discharge Date:  04/21/2015   Primary MD  Rogelia Boga, MD  Recommendations for primary care physician for things to follow:   Check CBC, CMP, UA in 1 week   Admission Diagnosis  fall   Discharge Diagnosis  fall     Principal Problem:   Closed fracture of right iliac wing Active Problems:   Hypothyroidism   Essential hypertension   Iron deficiency anemia   Peri-prosthetic fracture around prosthetic hip   Acute hyperglycemia   Protein calorie malnutrition   Fall   Protein-calorie malnutrition, severe      Past Medical History  Diagnosis Date  . GERD (gastroesophageal reflux disease)   . Hypertension   . Thyroid disease   . COPD (chronic obstructive pulmonary disease)   . Osteoarthritis   . Weight loss   . History of pyloric stenosis   . Chronic pain syndrome   . Macular degeneration     Past Surgical History  Procedure Laterality Date  . Appendectomy    . Cataract extraction    . Orif hip fracture    . Tubal ligation    . Nissan fundoplication  08/2007       HPI  from the history and physical done on the day of admission:    This is a 81 year old female patient with history of severe GERD and prior gastric surgical correction, hypertension, thyroid disease with noncompliance with Synthroid, protein calorie malnutrition (chronic), recurrent mechanical falls due to gait imbalance and history of prior surgeries to the right hip with initial surgery greater than 20 years ago with subsequent revision around 10 years ago. Patient sustained a mechanical fall by tripping over her feet prior to admission and developed severe right anterior hip  pain. Subsequent she was brought to the ER for evaluation.  In the ER she was afebrile and mildly hypertensive with a BP 160/92, respirations 18, pulse 94, room air sats 95%. I panel unremarkable except for mild hyperglycemia glucose 129, CBC unremarkable except for mild anemia hemoglobin 11.5. Plain films reveal acute right iliac wing fracture with minimal displacement as well as acute. Hardware fracture of the right hip arthroplasty with displacement of this appeared fracture fragment and the fracture line extending to involve the lesser trochanter. Dr. Barbera Setters and discussed with Dr. Ave Filter on call for orthopedics who requested admission by the hospitalist service. No surgery planned at this time. Orthopedic service plans to see the patient later this afternoon or first thing in the morning on 8/8. While in the ER patient has been given 2 mg of IV morphine as well as a dose of Zofran.     Hospital Course:     1. Mechanical fall with closed right-sided iliac wing and right-sided periprosthetic hip fracture. Seen by orthopedics, conservative Rx, pain control, 50% weightbearing on the right side, PT, the new Lovenox for DVT prophylaxis x  2 weeks per Ortho. Will require SNF for PT. Follow with Ortho as below.   2. Hypothyroidism. TSH stable, continue home dose Synthroid.   3. Essential hypertension. Home dose lisinopril continue added Lopressor for better control.   4. Iron deficiency anemia. No acute issues outpatient follow-up with PCP    5. Moderate protein calorie malnutrition. Protein supplementation per dietitian.    6. Likely stress related hyperglycemia upon admission. A1c of 5.6.   7.UTI - responded well to Rocephin, switch to PO Vantix x 5 days, H/O UTIs.       Discharge Condition: Fair  Follow UP  Follow-up Information    Follow up with Nestor Lewandowsky, MD In 2 weeks.   Specialty:  Orthopedic Surgery   Contact information:   [address] [place]  [address] [phone]       Follow up with Rogelia Boga, MD. Schedule an appointment as soon as possible for a visit in 1 week.   Specialty:  Internal Medicine   Contact information:   [address] [place] [address] [phone]        Consults obtained - Ortho  Diet and Activity recommendation: See Discharge Instructions below  Discharge Instructions       Discharge Instructions    Diet - low sodium heart healthy    Complete by:  As directed      Discharge instructions    Complete by:  As directed   Follow with Primary MD Rogelia Boga, MD in 7 days   Get CBC, CMP, UA, 2 view Chest X ray checked  by Primary MD next visit.    Activity: 50% weight bearing on the Right leg with Full fall precautions use walker/cane & assistance as needed   Disposition SNF   Diet: Heart Healthy with feeding assistance and aspiration precautions.  For Heart failure patients - Check your Weight same time everyday, if you gain over 2 pounds, or you develop in leg swelling, experience more shortness of breath or chest pain, call your Primary MD immediately. Follow Cardiac Low Salt Diet and 1.5 lit/day fluid restriction.   On your next visit with your primary care physician please Get Medicines reviewed and adjusted.   Please request your Prim.MD to go over all Hospital Tests and Procedure/Radiological results at the follow up, please get all Hospital records sent to your Prim MD by signing hospital release before you go home.   If you experience worsening of your admission symptoms, develop shortness of breath, life threatening emergency, suicidal or homicidal thoughts you must seek medical attention immediately by calling 911 or calling your MD immediately  if symptoms less severe.  You Must read complete instructions/literature along with all the possible adverse reactions/side effects for all the Medicines you take and that have been prescribed to you. Take  any new Medicines after you have completely understood and accpet all the possible adverse reactions/side effects.   Do not drive, operating heavy machinery, perform activities at heights, swimming or participation in water activities or provide baby sitting services if your were admitted for syncope or siezures until you have seen by Primary MD or a Neurologist and advised to do so again.  Do not drive when taking Pain medications.    Do not take more than prescribed Pain, Sleep and Anxiety Medications  Special Instructions: If you have smoked or chewed Tobacco  in the last 2 yrs please stop smoking, stop any regular Alcohol  and or any Recreational drug use.  Wear Seat belts while driving.   Please note  You were cared for by a hospitalist during your hospital stay. If you have any questions about your discharge medications or the care you received while you were in the hospital after you are discharged, you can call the unit and asked to speak with the hospitalist on call if the hospitalist that took care of you is not available. Once you are discharged, your primary care physician will handle any further medical issues. Please note that NO REFILLS for any discharge medications will be authorized once you are discharged, as it is imperative that you return to your primary care physician (or establish a relationship with a primary care physician if you do not have one) for your aftercare needs so that they can reassess your need for medications and monitor your lab values.     Partial weight bearing    Complete by:  As directed   % Body Weight:  50  Laterality:  right  Extremity:  Lower             Discharge Medications       Medication List    STOP taking these medications        Ibuprofen-Diphenhydramine Cit 200-38 MG Tabs      TAKE these medications        cefpodoxime 200 MG tablet  Commonly known as:  VANTIN  Take 1 tablet (200 mg total) by mouth 2 (two) times daily.  5 more days     docusate sodium 100 MG capsule  Commonly known as:  COLACE  Take 1 capsule (100 mg total) by mouth 2 (two) times daily.     enoxaparin 30 MG/0.3ML injection  Commonly known as:  LOVENOX  Inject 0.3 mLs (30 mg total) into the skin daily.     Fish Oil 1000 MG Caps  Take 1,000 mg by mouth daily.     levothyroxine 75 MCG tablet  Commonly known as:  SYNTHROID, LEVOTHROID  TAKE ONE TABLET BY MOUTH EVERY DAY     lisinopril 20 MG tablet  Commonly known as:  PRINIVIL,ZESTRIL  TAKE ONE TABLET BY MOUTH EVERY DAY     metoprolol tartrate 25 MG tablet  Commonly known as:  LOPRESSOR  Take 1 tablet (25 mg total) by mouth 2 (two) times daily.     oxyCODONE 5 MG immediate release tablet  Commonly known as:  Oxy IR/ROXICODONE  Take 1-2 tablets (5-10 mg total) by mouth every 4 (four) hours as needed for moderate pain.     PAIN RELIEF 500 MG tablet  Generic drug:  acetaminophen  Take 500 mg by mouth every 6 (six) hours as needed for mild pain or moderate pain.        Major procedures and Radiology Reports - PLEASE review detailed and final reports for all details, in brief -       Dg Chest Port 1 View  04/20/2015   CLINICAL DATA:  Fever. Recent right hip fracture and hip replacement surgery.  EXAM: PORTABLE CHEST - 1 VIEW  COMPARISON:  08/13/2007  FINDINGS: The cardiac silhouette, mediastinal and hilar contours are within normal limits given the low lung volumes. There is tortuosity and calcification of the thoracic aorta. Mild vascular congestion and streaky basilar atelectasis due to low lung volumes but no definite infiltrates or edema.  IMPRESSION: Low lung volumes but no definite infiltrates or effusion.   Electronically Signed   By: Rudie Meyer M.D.   On:  04/20/2015 13:05   Dg Hip Unilat  With Pelvis 2-3 Views Right  04/18/2015   CLINICAL DATA:  81 year old female with a history of fall at 4 a.m. Right hip pain.  EXAM: DG HIP (WITH OR WITHOUT PELVIS) 2-3V RIGHT   COMPARISON:  CT abdomen 07/05/2005.  FINDINGS: Compare to the prior CT scout image and CT, there has been interval right iliac wing fracture with minimal displacement.  Surgical changes of right hip arthroplasty with perihardware fracture and superior displacement of the superior fracture fragment. Fracture line extends medially to involve the lesser trochanter. Visualized femoral diaphysis is unremarkable. Components appear congruent.  Diffuse osteopenia.  Extensive vascular calcifications including aortic, iliac, and femoral disease.  Visualized proximal left femur unremarkable.  Degenerative changes of the spine.  The contours of the sacrum and left pelvis appear maintained.  IMPRESSION: Acute right iliac wing fracture with minimal displacement.  Acute perihardware fracture of right hip arthroplasty, with displacement of the superior fracture fragment and the fracture line extending to involve the lesser trochanter.  Extensive atherosclerosis.  Signed,  Yvone Neu. Loreta Ave, DO  [facility]  [facility]   Electronically Signed   By: Gilmer Mor D.O.   On: 04/18/2015 12:57    Micro Results      No results found for this or any previous visit (from the past 240 hour(s)).     Today   Subjective    Katherine Flynn today has no headache,no chest abdominal pain,no new weakness tingling or numbness, feels much better.   Objective   Blood pressure 147/67, pulse 97, temperature 98.1 F (36.7 C), temperature source Oral, resp. rate 20, weight 43.545 kg (96 lb), SpO2 98 %.   Intake/Output Summary (Last 24 hours) at 04/21/15 0813 Last data filed at 04/20/15 1802  Gross per 24 hour  Intake    480 ml  Output      0 ml  Net    480 ml    Exam Awake Alert, Oriented x 3, No new F.N deficits, Normal affect [place].AT,PERRAL Supple Neck,No JVD, No cervical lymphadenopathy appriciated.  Symmetrical Chest wall movement, Good air movement bilaterally,  CTAB RRR,No Gallops,Rubs or new Murmurs, No Parasternal Heave +ve B.Sounds, Abd Soft, Non tender, No organomegaly appriciated, No rebound -guarding or rigidity. No Cyanosis, Clubbing or edema, No new Rash or bruise   Data Review   CBC w Diff: Lab Results  Component Value Date   WBC 9.1 04/18/2015   HGB 11.5* 04/18/2015   HCT 35.1* 04/18/2015   PLT 336 04/18/2015   LYMPHOPCT 16 04/18/2015   MONOPCT 6 04/18/2015   EOSPCT 1 04/18/2015   BASOPCT 0 04/18/2015    CMP: Lab Results  Component Value Date   NA 141 04/18/2015   K 4.0 04/18/2015   CL 106 04/18/2015   CO2 26 04/18/2015   BUN 12 04/18/2015   CREATININE 0.74 04/18/2015   PROT 7.3 10/26/2014   ALBUMIN 4.1 10/26/2014   BILITOT 0.3 10/26/2014   ALKPHOS 80 10/26/2014   AST 22 10/26/2014   ALT 12 10/26/2014  .   Total Time in preparing paper work, data evaluation and todays exam - 35 minutes  Leroy Sea M.D on 04/21/2015 at 8:13 AM  [facility]   Office  [phone]

## 2015-04-21 NOTE — Progress Notes (Signed)
PATIENT ID: Katherine Flynn  MRN: [id number]  DOB/AGE:  79/10/1922 / 81 y.o.         PROGRESS NOTE Subjective:   Patient is alert, oriented, no Nausea, no Vomiting, yes passing gas, no Bowel Movement. Taking PO well with pt up and eating in bed. Denies SOB, Chest or Calf Pain. Using Incentive Spirometer, PAS in place. Ambulate 50 % to right leg.  Pt is currently limited due to a weak upper extrimity, Patient reports pain as mild at rest moderate to severe with movement.    Objective: Vital signs in last 24 hours: Temp:  [98.1 F (36.7 C)-100.4 F (38 C)] 98.1 F (36.7 C) (08/10 0430) Pulse Rate:  [90-97] 97 (08/10 0430) Resp:  [20] 20 (08/10 0430) BP: (133-147)/(51-67) 147/67 mmHg (08/10 0430) SpO2:  [98 %] 98 % (08/10 0430)    Intake/Output from previous day: I/O last 3 completed shifts: In: 700 [P.O.:700] Out: -    Intake/Output this shift:     LABORATORY DATA:  Recent Labs  04/18/15 1218 04/19/15 1148  WBC 9.1  --   HGB 11.5*  --   HCT 35.1*  --   PLT 336  --   NA 141  --   K 4.0  --   CL 106  --   CO2 26  --   BUN 12  --   CREATININE 0.74  --   GLUCOSE 129*  --   GLUCAP  --  150*  CALCIUM 9.9  --     Examination: Neurologically intact Neurovascular intact Sensation intact distally Intact pulses distally Dorsiflexion/Plantar flexion intact No cellulitis present Compartment soft}  Assessment:     Right illiac wing fracture  ADDITIONAL DIAGNOSIS:  Hypertension and COPD, GERD, Protein calorie malnutrition.  Plan:  Partial Weight Bearing @ 50% (PWB)  DVT Prophylaxis:  Lovenox x 2 weeks.  DISCHARGE PLAN: Skilled Nursing Facility/Rehab  DISCHARGE NEEDS: HHPT, HHRN, Walker and 3-in-1 comode seat   For the iliac wing fracture she will be partial weightbearing for 6-8 weeks. Lovenox for 2 weeks for DVT prophylaxis is certainly reasonable. For pain control she is tolerating the 5 mg oxycodone's and that is reasonable going forward. She will need  placement in a skilled nursing facility probably for 6-8 weeks. I agree that she is protein malnourished and will need calories going forward for rehabilitation.     Sallyanne Birkhead R 04/21/2015, 7:54 AM

## 2015-04-21 NOTE — Discharge Planning (Addendum)
Patient will discharge today per MD order. Patient will discharge to: [facility] RN to call report prior to transportation to: [phone] Transportation: [facility]  CSW sent discharge summary to SNF for review.  Packet is complete.  RN, patient and family aware of discharge plans.  Vickii Penna, LCSWA [phone]  Psychiatric & Orthopedics (5N 1-16) Clinical Social Worker    Insurance authorization received 12:53pm [phone]

## 2015-04-21 NOTE — Progress Notes (Signed)
Physical Therapy Treatment Patient Details Name: Katherine Flynn MRN: [id number] DOB: 1923-08-14 Today's Date: 04/21/2015    History of Present Illness right iliac wing fracture    PT Comments    Patient progressing well towards PT goals. More alert today. Tolerated standing with assist of 2 and compliant with PWB RLE. Able to recall precautions today. Highly motivated to return to PLOF. Appropriate for ST SNF. Will follow acutely to maximize independence and mobility prior to return home.   Follow Up Recommendations  SNF     Equipment Recommendations  None recommended by PT    Recommendations for Other Services       Precautions / Restrictions Precautions Precautions: Fall Restrictions Weight Bearing Restrictions: Yes RLE Weight Bearing: Partial weight bearing RLE Partial Weight Bearing Percentage or Pounds: 50    Mobility  Bed Mobility Overal bed mobility: Needs Assistance Bed Mobility: Supine to Sit     Supine to sit: Mod assist;+2 for physical assistance;HOB elevated     General bed mobility comments: Cues for sequencing. Cues to mobilize RLE and bottom to EOB. Assist to elevate trunk. Use of rail for support.   Transfers Overall transfer level: Needs assistance Equipment used: Rolling walker (2 wheeled) Transfers: Sit to/from Stand Sit to Stand: Mod assist;+2 physical assistance Stand pivot transfers: Min assist;+2 physical assistance       General transfer comment: Mod A of 2 to boost from EOB with cues for hand placement and anterior translation. Pt compliant with PWB RLE upon standing. manual and verbal cues for upright posture. SPT Min A of 2 towards left side with cues.  Ambulation/Gait                 Stairs            Wheelchair Mobility    Modified Rankin (Stroke Patients Only)       Balance Overall balance assessment: Needs assistance;History of Falls Sitting-balance support: Feet supported;No upper extremity  supported Sitting balance-Leahy Scale: Fair Sitting balance - Comments: Able to perform static sitting balance wihtout UE support for short periods.   Standing balance support: During functional activity Standing balance-Leahy Scale: Poor Standing balance comment: Mod A for standing balance due to PWB status and to facilitate upright posture.                    Cognition Arousal/Alertness: Awake/alert Behavior During Therapy: WFL for tasks assessed/performed Overall Cognitive Status: Within Functional Limits for tasks assessed       Memory:  (Able to recall WB status RLE.)              Exercises General Exercises - Lower Extremity Long Arc Quad: Both;10 reps;Seated    General Comments General comments (skin integrity, edema, etc.): Daughter present during session.      Pertinent Vitals/Pain Pain Assessment: Faces Faces Pain Scale: Hurts even more Pain Location: right pelvis with movement Pain Descriptors / Indicators: Grimacing;Guarding;Sore Pain Intervention(s): Monitored during session;Repositioned;Premedicated before session    Home Living                      Prior Function            PT Goals (current goals can now be found in the care plan section) Progress towards PT goals: Progressing toward goals    Frequency  Min 3X/week    PT Plan Current plan remains appropriate    Co-evaluation  End of Session Equipment Utilized During Treatment: Gait belt Activity Tolerance: Patient tolerated treatment well Patient left: in chair;with call bell/phone within reach;with family/visitor present;with chair alarm set     Time: 1011-1030 PT Time Calculation (min) (ACUTE ONLY): 19 min  Charges:  $Therapeutic Activity: 8-22 mins                    G Codes:      Deryk Bozman A Britnee Mcdevitt 04/21/2015, 12:04 PM Mylo Red, PT, DPT [phone]

## 2015-04-21 NOTE — Clinical Social Work Placement (Signed)
   CLINICAL SOCIAL WORK PLACEMENT  NOTE  Date:  04/21/2015  Patient Details  Name: Katherine Flynn MRN: [id number] Date of Birth: 07-08-1923  Clinical Social Work is seeking post-discharge placement for this patient at the Skilled  Nursing Facility level of care (*CSW will initial, date and re-position this form in  chart as items are completed):  Yes   Patient/family provided with [place] Clinical Social Work Department's list of facilities offering this level of care within the geographic area requested by the patient (or if unable, by the patient's family).  Yes   Patient/family informed of their freedom to choose among providers that offer the needed level of care, that participate in Medicare, Medicaid or managed care program needed by the patient, have an available bed and are willing to accept the patient.  Yes   Patient/family informed of 's ownership interest in [facility] and [facility], as well as of the fact that they are under no obligation to receive care at these facilities.  PASRR submitted to EDS on 04/20/15     PASRR number received on 04/20/15     Existing PASRR number confirmed on  (n/a)     FL2 transmitted to all facilities in geographic area requested by pt/family on 04/20/15     FL2 transmitted to all facilities within larger geographic area on  (n/a)     Patient informed that his/her managed care company has contracts with or will negotiate with certain facilities, including the following:   (yes, [facility])         Patient/family informed of bed offers received.  Patient chooses bed at [facility]     Physician recommends and patient chooses bed at      Patient to be transferred to [facility] on 04/21/15.  Patient to be transferred to facility by [facility]     Patient family notified on 04/21/15 of transfer.  Name of family member notified:  patient      PHYSICIAN Please sign FL2      Additional Comment:    _______________________________________________ Rondel Baton, LCSW 04/21/2015, 12:14 PM

## 2015-04-22 LAB — VITAMIN D 1,25 DIHYDROXY
VITAMIN D 1, 25 (OH) TOTAL: 26 pg/mL
VITAMIN D3 1, 25 (OH): 26 pg/mL

## 2015-04-22 NOTE — Progress Notes (Signed)
PT addendum, late entry for 04/19/15.   04/19/15 0900  PT Time Calculation  PT Start Time (ACUTE ONLY) 0916  PT Stop Time (ACUTE ONLY) 0944  PT Time Calculation (min) (ACUTE ONLY) 28 min  PT G-Codes **NOT FOR INPATIENT CLASS**  Functional Assessment Tool Used clinical judgment  Functional Limitation Mobility: Walking and moving around  Mobility: Walking and Moving Around Current Status (Z6109) CL  Mobility: Walking and Moving Around Goal Status (U0454) CJ  PT General Charges  $$ ACUTE PT VISIT 1 Procedure  PT Evaluation  $Initial PT Evaluation Tier I 1 Procedure  PT Treatments  $Therapeutic Activity 8-22 mins  Christiane Ha, PT, CSCS Pager [phone] Office [phone]

## 2015-04-24 LAB — URINE CULTURE

## 2015-04-25 DIAGNOSIS — R2689 Other abnormalities of gait and mobility: Secondary | ICD-10-CM | POA: Diagnosis not present

## 2015-04-25 DIAGNOSIS — K219 Gastro-esophageal reflux disease without esophagitis: Secondary | ICD-10-CM | POA: Diagnosis not present

## 2015-04-25 DIAGNOSIS — S329XXA Fracture of unspecified parts of lumbosacral spine and pelvis, initial encounter for closed fracture: Secondary | ICD-10-CM | POA: Diagnosis not present

## 2015-04-25 DIAGNOSIS — E039 Hypothyroidism, unspecified: Secondary | ICD-10-CM | POA: Diagnosis not present

## 2015-04-25 DIAGNOSIS — E440 Moderate protein-calorie malnutrition: Secondary | ICD-10-CM | POA: Diagnosis not present

## 2015-04-25 DIAGNOSIS — D649 Anemia, unspecified: Secondary | ICD-10-CM | POA: Diagnosis not present

## 2015-05-05 DIAGNOSIS — S32810D Multiple fractures of pelvis with stable disruption of pelvic ring, subsequent encounter for fracture with routine healing: Secondary | ICD-10-CM | POA: Diagnosis not present

## 2015-05-28 DIAGNOSIS — S32301D Unspecified fracture of right ilium, subsequent encounter for fracture with routine healing: Secondary | ICD-10-CM | POA: Diagnosis not present

## 2015-06-01 ENCOUNTER — Telehealth: Payer: Self-pay | Admitting: Internal Medicine

## 2015-06-01 NOTE — Telephone Encounter (Signed)
Pt has fup for 20 days of rehab on 10/3

## 2015-06-04 DIAGNOSIS — Z96641 Presence of right artificial hip joint: Secondary | ICD-10-CM | POA: Diagnosis not present

## 2015-06-04 DIAGNOSIS — Z9181 History of falling: Secondary | ICD-10-CM | POA: Diagnosis not present

## 2015-06-04 DIAGNOSIS — Z7901 Long term (current) use of anticoagulants: Secondary | ICD-10-CM | POA: Diagnosis not present

## 2015-06-04 DIAGNOSIS — G894 Chronic pain syndrome: Secondary | ICD-10-CM | POA: Diagnosis not present

## 2015-06-04 DIAGNOSIS — S32301D Unspecified fracture of right ilium, subsequent encounter for fracture with routine healing: Secondary | ICD-10-CM | POA: Diagnosis not present

## 2015-06-04 DIAGNOSIS — J449 Chronic obstructive pulmonary disease, unspecified: Secondary | ICD-10-CM | POA: Diagnosis not present

## 2015-06-04 DIAGNOSIS — I10 Essential (primary) hypertension: Secondary | ICD-10-CM | POA: Diagnosis not present

## 2015-06-06 ENCOUNTER — Other Ambulatory Visit: Payer: Self-pay | Admitting: Internal Medicine

## 2015-06-07 DIAGNOSIS — G894 Chronic pain syndrome: Secondary | ICD-10-CM | POA: Diagnosis not present

## 2015-06-07 DIAGNOSIS — Z96641 Presence of right artificial hip joint: Secondary | ICD-10-CM | POA: Diagnosis not present

## 2015-06-07 DIAGNOSIS — S32301D Unspecified fracture of right ilium, subsequent encounter for fracture with routine healing: Secondary | ICD-10-CM | POA: Diagnosis not present

## 2015-06-07 DIAGNOSIS — J449 Chronic obstructive pulmonary disease, unspecified: Secondary | ICD-10-CM | POA: Diagnosis not present

## 2015-06-07 DIAGNOSIS — Z9181 History of falling: Secondary | ICD-10-CM | POA: Diagnosis not present

## 2015-06-07 DIAGNOSIS — I10 Essential (primary) hypertension: Secondary | ICD-10-CM | POA: Diagnosis not present

## 2015-06-07 DIAGNOSIS — Z7901 Long term (current) use of anticoagulants: Secondary | ICD-10-CM | POA: Diagnosis not present

## 2015-06-08 ENCOUNTER — Telehealth: Payer: Self-pay | Admitting: *Deleted

## 2015-06-08 DIAGNOSIS — I10 Essential (primary) hypertension: Secondary | ICD-10-CM | POA: Diagnosis not present

## 2015-06-08 DIAGNOSIS — Z7901 Long term (current) use of anticoagulants: Secondary | ICD-10-CM | POA: Diagnosis not present

## 2015-06-08 DIAGNOSIS — Z96641 Presence of right artificial hip joint: Secondary | ICD-10-CM | POA: Diagnosis not present

## 2015-06-08 DIAGNOSIS — G894 Chronic pain syndrome: Secondary | ICD-10-CM | POA: Diagnosis not present

## 2015-06-08 DIAGNOSIS — Z9181 History of falling: Secondary | ICD-10-CM | POA: Diagnosis not present

## 2015-06-08 DIAGNOSIS — J449 Chronic obstructive pulmonary disease, unspecified: Secondary | ICD-10-CM | POA: Diagnosis not present

## 2015-06-08 DIAGNOSIS — S32301D Unspecified fracture of right ilium, subsequent encounter for fracture with routine healing: Secondary | ICD-10-CM | POA: Diagnosis not present

## 2015-06-08 NOTE — Telephone Encounter (Signed)
ok 

## 2015-06-08 NOTE — Telephone Encounter (Signed)
Received voicemail from [facility] Physical Therapist, saying saw pt today PT evaluation done and would like to continue therapy 2 x's a week for 3 weeks and 1 x a week for 1 week. Please advise if okay for PT?

## 2015-06-09 NOTE — Telephone Encounter (Signed)
Spoke to [place], Physical Therapist, verbal orders given physical therapy 2 x's a week for 3 weeks and 1 x a week for 1 week, okay per Dr.K. Cindee Lame verbalized understanding.

## 2015-06-10 DIAGNOSIS — G894 Chronic pain syndrome: Secondary | ICD-10-CM | POA: Diagnosis not present

## 2015-06-10 DIAGNOSIS — Z9181 History of falling: Secondary | ICD-10-CM | POA: Diagnosis not present

## 2015-06-10 DIAGNOSIS — Z7901 Long term (current) use of anticoagulants: Secondary | ICD-10-CM | POA: Diagnosis not present

## 2015-06-10 DIAGNOSIS — S32301D Unspecified fracture of right ilium, subsequent encounter for fracture with routine healing: Secondary | ICD-10-CM | POA: Diagnosis not present

## 2015-06-10 DIAGNOSIS — I10 Essential (primary) hypertension: Secondary | ICD-10-CM | POA: Diagnosis not present

## 2015-06-10 DIAGNOSIS — J449 Chronic obstructive pulmonary disease, unspecified: Secondary | ICD-10-CM | POA: Diagnosis not present

## 2015-06-10 DIAGNOSIS — Z96641 Presence of right artificial hip joint: Secondary | ICD-10-CM | POA: Diagnosis not present

## 2015-06-11 DIAGNOSIS — I10 Essential (primary) hypertension: Secondary | ICD-10-CM | POA: Diagnosis not present

## 2015-06-11 DIAGNOSIS — Z96641 Presence of right artificial hip joint: Secondary | ICD-10-CM | POA: Diagnosis not present

## 2015-06-11 DIAGNOSIS — J449 Chronic obstructive pulmonary disease, unspecified: Secondary | ICD-10-CM | POA: Diagnosis not present

## 2015-06-11 DIAGNOSIS — Z9181 History of falling: Secondary | ICD-10-CM | POA: Diagnosis not present

## 2015-06-11 DIAGNOSIS — S32301D Unspecified fracture of right ilium, subsequent encounter for fracture with routine healing: Secondary | ICD-10-CM | POA: Diagnosis not present

## 2015-06-11 DIAGNOSIS — Z7901 Long term (current) use of anticoagulants: Secondary | ICD-10-CM | POA: Diagnosis not present

## 2015-06-11 DIAGNOSIS — G894 Chronic pain syndrome: Secondary | ICD-10-CM | POA: Diagnosis not present

## 2015-06-14 ENCOUNTER — Ambulatory Visit (INDEPENDENT_AMBULATORY_CARE_PROVIDER_SITE_OTHER): Payer: Commercial Managed Care - HMO | Admitting: Internal Medicine

## 2015-06-14 ENCOUNTER — Encounter: Payer: Self-pay | Admitting: Internal Medicine

## 2015-06-14 VITALS — BP 160/90 | HR 91 | Temp 98.5°F | Resp 18 | Ht <= 58 in | Wt 96.0 lb

## 2015-06-14 DIAGNOSIS — I10 Essential (primary) hypertension: Secondary | ICD-10-CM | POA: Diagnosis not present

## 2015-06-14 DIAGNOSIS — Z96649 Presence of unspecified artificial hip joint: Secondary | ICD-10-CM

## 2015-06-14 DIAGNOSIS — E43 Unspecified severe protein-calorie malnutrition: Secondary | ICD-10-CM | POA: Diagnosis not present

## 2015-06-14 DIAGNOSIS — E039 Hypothyroidism, unspecified: Secondary | ICD-10-CM | POA: Diagnosis not present

## 2015-06-14 DIAGNOSIS — D6489 Other specified anemias: Secondary | ICD-10-CM | POA: Diagnosis not present

## 2015-06-14 DIAGNOSIS — Z23 Encounter for immunization: Secondary | ICD-10-CM

## 2015-06-14 DIAGNOSIS — S32301D Unspecified fracture of right ilium, subsequent encounter for fracture with routine healing: Secondary | ICD-10-CM | POA: Diagnosis not present

## 2015-06-14 DIAGNOSIS — M978XXD Periprosthetic fracture around other internal prosthetic joint, subsequent encounter: Secondary | ICD-10-CM

## 2015-06-14 LAB — CBC WITH DIFFERENTIAL/PLATELET
BASOS PCT: 0.8 % (ref 0.0–3.0)
Basophils Absolute: 0 10*3/uL (ref 0.0–0.1)
EOS ABS: 0.2 10*3/uL (ref 0.0–0.7)
EOS PCT: 3.3 % (ref 0.0–5.0)
HCT: 37.7 % (ref 36.0–46.0)
Hemoglobin: 12.2 g/dL (ref 12.0–15.0)
LYMPHS ABS: 2 10*3/uL (ref 0.7–4.0)
Lymphocytes Relative: 33 % (ref 12.0–46.0)
MCHC: 32.3 g/dL (ref 30.0–36.0)
MCV: 89.1 fl (ref 78.0–100.0)
Monocytes Absolute: 0.4 10*3/uL (ref 0.1–1.0)
Monocytes Relative: 6.2 % (ref 3.0–12.0)
NEUTROS PCT: 56.7 % (ref 43.0–77.0)
Neutro Abs: 3.5 10*3/uL (ref 1.4–7.7)
PLATELETS: 403 10*3/uL — AB (ref 150.0–400.0)
RBC: 4.23 Mil/uL (ref 3.87–5.11)
RDW: 13.9 % (ref 11.5–15.5)
WBC: 6.2 10*3/uL (ref 4.0–10.5)

## 2015-06-14 LAB — BASIC METABOLIC PANEL
BUN: 12 mg/dL (ref 6–23)
CALCIUM: 9.7 mg/dL (ref 8.4–10.5)
CO2: 27 mEq/L (ref 19–32)
Chloride: 104 mEq/L (ref 96–112)
Creatinine, Ser: 0.64 mg/dL (ref 0.40–1.20)
GFR: 92.68 mL/min (ref >60.00)
Glucose, Bld: 110 mg/dL — ABNORMAL HIGH (ref 70–99)
POTASSIUM: 3.6 meq/L (ref 3.5–5.1)
SODIUM: 142 meq/L (ref 135–145)

## 2015-06-14 MED ORDER — OXYCODONE HCL 5 MG PO TABS: 5.0000 mg | ORAL_TABLET | ORAL | Status: DC | PRN

## 2015-06-14 NOTE — Addendum Note (Signed)
Addended by: Jimmye Norman on: 06/14/2015 12:18 PM   Modules accepted: Orders

## 2015-06-14 NOTE — Progress Notes (Signed)
Pre visit review using our clinic review tool, if applicable. No additional management support is needed unless otherwise documented below in the visit note. 

## 2015-06-14 NOTE — Patient Instructions (Signed)
Limit your sodium (Salt) intake  Return in 6 months for follow-up  

## 2015-06-14 NOTE — Progress Notes (Signed)
Subjective:    Patient ID: Katherine Flynn, female    DOB: December 06, 1922, 81 y.o.   MRN: [id number]  HPI  Admission date: 04/18/2015 Admitting Physician Renae Fickle, MD  Discharge Date: 04/21/2015   Primary MD Rogelia Boga, MD  Recommendations for primary care physician for things to follow:   Check CBC, CMP, UA in 1 week   Admission Diagnosis fall   Discharge Diagnosis fall   Principal Problem:  Closed fracture of right iliac wing Active Problems:  Hypothyroidism  Essential hypertension  Iron deficiency anemia  Peri-prosthetic fracture around prosthetic hip  Acute hyperglycemia  Protein calorie malnutrition  Fall  Protein-calorie malnutrition, severe    81 year old patient who was hospitalized almost 2 months ago after a fall resulting in a.  Prosthetic fracture around a prosthetic hip.  She was [facility] to [facility] nursing home and has been home for about 1 week.  She continues to do quite well.  No concerns or complaints.  She now uses a walker.  She had some mild postop anemia.  In general doing quite well.  Appetite is a bit subpar  Wt Readings from Last 3 Encounters:  06/14/15 96 lb (43.545 kg)  04/19/15 96 lb (43.545 kg)  02/22/15 97 lb (43.999 kg)    Past Medical History  Diagnosis Date  . GERD (gastroesophageal reflux disease)   . Hypertension   . Thyroid disease   . COPD (chronic obstructive pulmonary disease) (HCC)   . Osteoarthritis   . Weight loss   . History of pyloric stenosis   . Chronic pain syndrome   . Macular degeneration     Social History   Social History  . Marital Status: Widowed    Spouse Name: N/A  . Number of Children: N/A  . Years of Education: N/A   Occupational History  . Not on file.   Social History Main Topics  . Smoking status: Never Smoker   . Smokeless tobacco: Never Used  . Alcohol Use: No  . Drug Use: No  . Sexual Activity: Not on file   Other Topics Concern  . Not  on file   Social History Narrative    Past Surgical History  Procedure Laterality Date  . Appendectomy    . Cataract extraction    . Orif hip fracture    . Tubal ligation    . Nissan fundoplication  08/2007    No family history on file.  No Known Allergies  Current Outpatient Prescriptions on File Prior to Visit  Medication Sig Dispense Refill  . acetaminophen (PAIN RELIEF) 500 MG tablet Take 500 mg by mouth every 6 (six) hours as needed for mild pain or moderate pain.     Marland Kitchen docusate sodium (COLACE) 100 MG capsule Take 1 capsule (100 mg total) by mouth 2 (two) times daily. 10 capsule 0  . levothyroxine (SYNTHROID, LEVOTHROID) 75 MCG tablet TAKE ONE TABLET BY MOUTH ONCE DAILY 90 tablet 1  . lisinopril (PRINIVIL,ZESTRIL) 20 MG tablet TAKE ONE TABLET BY MOUTH ONCE DAILY 90 tablet 1  . metoprolol tartrate (LOPRESSOR) 25 MG tablet Take 1 tablet (25 mg total) by mouth 2 (two) times daily.     No current facility-administered medications on file prior to visit.    BP 160/90 mmHg  Pulse 91  Temp(Src) 98.5 F (36.9 C) (Oral)  Resp 18  Ht  (1.473 m)  Wt 96 lb (43.545 kg)  BMI 20.07 kg/m2  SpO2 97%     Review of  Systems  Constitutional: Negative.   HENT: Negative for congestion, dental problem, hearing loss, rhinorrhea, sinus pressure, sore throat and tinnitus.   Eyes: Negative for pain, discharge and visual disturbance.  Respiratory: Negative for cough and shortness of breath.   Cardiovascular: Negative for chest pain, palpitations and leg swelling.  Gastrointestinal: Negative for nausea, vomiting, abdominal pain, diarrhea, constipation, blood in stool and abdominal distention.  Genitourinary: Negative for dysuria, urgency, frequency, hematuria, flank pain, vaginal bleeding, vaginal discharge, difficulty urinating, vaginal pain and pelvic pain.  Musculoskeletal: Positive for gait problem. Negative for joint swelling and arthralgias.  Skin: Negative for rash.    Neurological: Negative for dizziness, syncope, speech difficulty, weakness, numbness and headaches.  Hematological: Negative for adenopathy.  Psychiatric/Behavioral: Negative for behavioral problems, dysphoric mood and agitation. The patient is not nervous/anxious.        Objective:   Physical Exam  Constitutional: She is oriented to person, place, and time. She appears well-developed and well-nourished.  Repeat blood pressure 124/84  HENT:  Head: Normocephalic.  Right Ear: External ear normal.  Left Ear: External ear normal.  Mouth/Throat: Oropharynx is clear and moist.  Eyes: Conjunctivae and EOM are normal. Pupils are equal, round, and reactive to light.  Neck: Normal range of motion. Neck supple. No thyromegaly present.  Cardiovascular: Normal rate, regular rhythm, normal heart sounds and intact distal pulses.   Pulmonary/Chest: Effort normal and breath sounds normal.  Abdominal: Soft. Bowel sounds are normal. She exhibits no mass. There is no tenderness.  Musculoskeletal: Normal range of motion. She exhibits no edema.  Lymphadenopathy:    She has no cervical adenopathy.  Neurological: She is alert and oriented to person, place, and time.  Skin: Skin is warm and dry. No rash noted.  Psychiatric: She has a normal mood and affect. Her behavior is normal.          Assessment & Plan:    Hypertension, controlled.  We'll continue present regimen History postop anemia.  Will check a follow-up CBC COPD stable.  Resolving cough History weight loss.  Stable Recurrent UTIs.  Asymptomatic  Medicines updated Coumadin anticoagulation discontinued.  Patient has not been taking since discharge from the nursing home  Recheck 6 months or as needed

## 2015-06-15 DIAGNOSIS — J449 Chronic obstructive pulmonary disease, unspecified: Secondary | ICD-10-CM | POA: Diagnosis not present

## 2015-06-15 DIAGNOSIS — S32301D Unspecified fracture of right ilium, subsequent encounter for fracture with routine healing: Secondary | ICD-10-CM | POA: Diagnosis not present

## 2015-06-15 DIAGNOSIS — Z9181 History of falling: Secondary | ICD-10-CM | POA: Diagnosis not present

## 2015-06-15 DIAGNOSIS — G894 Chronic pain syndrome: Secondary | ICD-10-CM | POA: Diagnosis not present

## 2015-06-15 DIAGNOSIS — Z96641 Presence of right artificial hip joint: Secondary | ICD-10-CM | POA: Diagnosis not present

## 2015-06-15 DIAGNOSIS — I10 Essential (primary) hypertension: Secondary | ICD-10-CM | POA: Diagnosis not present

## 2015-06-15 DIAGNOSIS — Z7901 Long term (current) use of anticoagulants: Secondary | ICD-10-CM | POA: Diagnosis not present

## 2015-06-16 DIAGNOSIS — Z7901 Long term (current) use of anticoagulants: Secondary | ICD-10-CM | POA: Diagnosis not present

## 2015-06-16 DIAGNOSIS — Z96641 Presence of right artificial hip joint: Secondary | ICD-10-CM | POA: Diagnosis not present

## 2015-06-16 DIAGNOSIS — S32301D Unspecified fracture of right ilium, subsequent encounter for fracture with routine healing: Secondary | ICD-10-CM | POA: Diagnosis not present

## 2015-06-16 DIAGNOSIS — J449 Chronic obstructive pulmonary disease, unspecified: Secondary | ICD-10-CM | POA: Diagnosis not present

## 2015-06-16 DIAGNOSIS — Z9181 History of falling: Secondary | ICD-10-CM | POA: Diagnosis not present

## 2015-06-16 DIAGNOSIS — I10 Essential (primary) hypertension: Secondary | ICD-10-CM | POA: Diagnosis not present

## 2015-06-16 DIAGNOSIS — G894 Chronic pain syndrome: Secondary | ICD-10-CM | POA: Diagnosis not present

## 2015-06-18 DIAGNOSIS — J449 Chronic obstructive pulmonary disease, unspecified: Secondary | ICD-10-CM | POA: Diagnosis not present

## 2015-06-18 DIAGNOSIS — Z96641 Presence of right artificial hip joint: Secondary | ICD-10-CM | POA: Diagnosis not present

## 2015-06-18 DIAGNOSIS — I10 Essential (primary) hypertension: Secondary | ICD-10-CM | POA: Diagnosis not present

## 2015-06-18 DIAGNOSIS — G894 Chronic pain syndrome: Secondary | ICD-10-CM | POA: Diagnosis not present

## 2015-06-18 DIAGNOSIS — S32301D Unspecified fracture of right ilium, subsequent encounter for fracture with routine healing: Secondary | ICD-10-CM | POA: Diagnosis not present

## 2015-06-18 DIAGNOSIS — Z9181 History of falling: Secondary | ICD-10-CM | POA: Diagnosis not present

## 2015-06-18 DIAGNOSIS — Z7901 Long term (current) use of anticoagulants: Secondary | ICD-10-CM | POA: Diagnosis not present

## 2015-06-21 DIAGNOSIS — Z7901 Long term (current) use of anticoagulants: Secondary | ICD-10-CM | POA: Diagnosis not present

## 2015-06-21 DIAGNOSIS — Z9181 History of falling: Secondary | ICD-10-CM | POA: Diagnosis not present

## 2015-06-21 DIAGNOSIS — J449 Chronic obstructive pulmonary disease, unspecified: Secondary | ICD-10-CM | POA: Diagnosis not present

## 2015-06-21 DIAGNOSIS — S32301D Unspecified fracture of right ilium, subsequent encounter for fracture with routine healing: Secondary | ICD-10-CM | POA: Diagnosis not present

## 2015-06-21 DIAGNOSIS — Z96641 Presence of right artificial hip joint: Secondary | ICD-10-CM | POA: Diagnosis not present

## 2015-06-21 DIAGNOSIS — G894 Chronic pain syndrome: Secondary | ICD-10-CM | POA: Diagnosis not present

## 2015-06-21 DIAGNOSIS — I10 Essential (primary) hypertension: Secondary | ICD-10-CM | POA: Diagnosis not present

## 2015-06-24 DIAGNOSIS — Z96641 Presence of right artificial hip joint: Secondary | ICD-10-CM | POA: Diagnosis not present

## 2015-06-24 DIAGNOSIS — Z9181 History of falling: Secondary | ICD-10-CM | POA: Diagnosis not present

## 2015-06-24 DIAGNOSIS — S32301D Unspecified fracture of right ilium, subsequent encounter for fracture with routine healing: Secondary | ICD-10-CM | POA: Diagnosis not present

## 2015-06-24 DIAGNOSIS — G894 Chronic pain syndrome: Secondary | ICD-10-CM | POA: Diagnosis not present

## 2015-06-24 DIAGNOSIS — Z7901 Long term (current) use of anticoagulants: Secondary | ICD-10-CM | POA: Diagnosis not present

## 2015-06-24 DIAGNOSIS — J449 Chronic obstructive pulmonary disease, unspecified: Secondary | ICD-10-CM | POA: Diagnosis not present

## 2015-06-24 DIAGNOSIS — I10 Essential (primary) hypertension: Secondary | ICD-10-CM | POA: Diagnosis not present

## 2015-06-25 DIAGNOSIS — S32301D Unspecified fracture of right ilium, subsequent encounter for fracture with routine healing: Secondary | ICD-10-CM | POA: Diagnosis not present

## 2015-06-25 DIAGNOSIS — Z96641 Presence of right artificial hip joint: Secondary | ICD-10-CM | POA: Diagnosis not present

## 2015-06-25 DIAGNOSIS — Z9181 History of falling: Secondary | ICD-10-CM | POA: Diagnosis not present

## 2015-06-25 DIAGNOSIS — J449 Chronic obstructive pulmonary disease, unspecified: Secondary | ICD-10-CM | POA: Diagnosis not present

## 2015-06-25 DIAGNOSIS — Z7901 Long term (current) use of anticoagulants: Secondary | ICD-10-CM | POA: Diagnosis not present

## 2015-06-25 DIAGNOSIS — I10 Essential (primary) hypertension: Secondary | ICD-10-CM | POA: Diagnosis not present

## 2015-06-25 DIAGNOSIS — G894 Chronic pain syndrome: Secondary | ICD-10-CM | POA: Diagnosis not present

## 2015-06-28 DIAGNOSIS — J449 Chronic obstructive pulmonary disease, unspecified: Secondary | ICD-10-CM | POA: Diagnosis not present

## 2015-06-28 DIAGNOSIS — Z7901 Long term (current) use of anticoagulants: Secondary | ICD-10-CM | POA: Diagnosis not present

## 2015-06-28 DIAGNOSIS — Z9181 History of falling: Secondary | ICD-10-CM | POA: Diagnosis not present

## 2015-06-28 DIAGNOSIS — S32301D Unspecified fracture of right ilium, subsequent encounter for fracture with routine healing: Secondary | ICD-10-CM | POA: Diagnosis not present

## 2015-06-28 DIAGNOSIS — I10 Essential (primary) hypertension: Secondary | ICD-10-CM | POA: Diagnosis not present

## 2015-06-28 DIAGNOSIS — G894 Chronic pain syndrome: Secondary | ICD-10-CM | POA: Diagnosis not present

## 2015-06-28 DIAGNOSIS — Z96641 Presence of right artificial hip joint: Secondary | ICD-10-CM | POA: Diagnosis not present

## 2015-07-03 DIAGNOSIS — G894 Chronic pain syndrome: Secondary | ICD-10-CM | POA: Diagnosis not present

## 2015-07-03 DIAGNOSIS — S32301D Unspecified fracture of right ilium, subsequent encounter for fracture with routine healing: Secondary | ICD-10-CM | POA: Diagnosis not present

## 2015-07-03 DIAGNOSIS — Z96641 Presence of right artificial hip joint: Secondary | ICD-10-CM | POA: Diagnosis not present

## 2015-07-03 DIAGNOSIS — Z9181 History of falling: Secondary | ICD-10-CM | POA: Diagnosis not present

## 2015-07-03 DIAGNOSIS — J449 Chronic obstructive pulmonary disease, unspecified: Secondary | ICD-10-CM | POA: Diagnosis not present

## 2015-07-03 DIAGNOSIS — I10 Essential (primary) hypertension: Secondary | ICD-10-CM | POA: Diagnosis not present

## 2015-07-03 DIAGNOSIS — Z7901 Long term (current) use of anticoagulants: Secondary | ICD-10-CM | POA: Diagnosis not present

## 2015-07-05 ENCOUNTER — Telehealth: Payer: Self-pay | Admitting: Internal Medicine

## 2015-07-05 NOTE — Telephone Encounter (Signed)
Pt call to req a rx for a wheel chair. She said she can not use the walker for long periods of time when she is out with her family.

## 2015-07-05 NOTE — Telephone Encounter (Signed)
Spoke with pt she will call back to make an appt

## 2015-07-05 NOTE — Telephone Encounter (Signed)
Pt will need to schedule Face to Face 30 minute appt in order to order wheelchair.

## 2015-10-25 ENCOUNTER — Telehealth: Payer: Self-pay

## 2015-10-25 NOTE — Telephone Encounter (Signed)
[facility] Night - Client TELEPHONE ADVICE RECORD [facility] Patient Name: Katherine Flynn Gender: Female DOB: 08/22/1955 Age: 82 Y 1 M 30 D Return Phone Number: [phone] (Primary) Address: City/State/Zip: [place] Client [facility] Night - Client Client Site  [facility] - Night Physician [place] Contact Type Call Who Is Calling Patient / Member / Family / Caregiver Call Type Triage / Clinical Relationship To Patient Self Return Phone Number [phone] (Primary) Chief Complaint Nasal Congestion Reason for Call Symptomatic / Request for Health Information Initial Comment Caller states saw MD Wed; dx w/acute bronchitis; Rx'd mucinex dm, and hydrocodone; nasal congestion, neck pain and fever; states congestion is worse; PreDisposition Home Care Translation No Nurse Assessment Nurse: Lars Pinks, RN, [facility] Abide Date/Time (Eastern Time): 10/23/2015 10:11:50 AM Confirm and document reason for call. If symptomatic, describe symptoms. You must click the next button to save text entered. ---Patient reports she is having facial pain and pressure from congestion. Sates has a low grade fever. States she is short of breath. Does not have a neb or inhaler avail, used in the past. Has the patient traveled out of the country within the last 30 days? ---No Does the patient have any new or worsening symptoms? ---Yes Will a triage be completed? ---Yes Related visit to physician within the last 2 weeks? ---Yes Does the PT have any chronic conditions? (i.e. diabetes, asthma, etc.) ---Yes List chronic conditions. ---Hypertension, hypothyroidism, B-12 def anemia, Arthritis, smoker Is this a behavioral health or substance abuse call? ---No Guidelines Guideline Title Affirmed Question Affirmed Notes Nurse Date/Time (Eastern Time) Cough - Acute Non- Productive Wheezing is present Lars Pinks, RN, Earley Abide  10/23/2015 10:15:51 AM Disp. Time Lamount Cohen Time) Disposition Final User 10/23/2015 10:19:15 AM See Physician within 4 Hours (or PCP triage) Lars Pinks, RN, Hilda PLEASE NOTE: All timestamps contained within this report are represented as [place] Standard Time. CONFIDENTIALTY NOTICE: This fax transmission is intended only for the addressee. It contains information that is legally privileged, confidential or otherwise protected from use or disclosure. If you are not the intended recipient, you are strictly prohibited from reviewing, disclosing, copying using or disseminating any of this information or taking any action in reliance on or regarding this information. If you have received this fax in error, please notify us immediately by telephone so that we can arrange for its return to [place]. Phone: [phone], Toll-Free: [phone], Fax: [phone] Page: 2 of 2 Call Id: [id number] 10/23/2015 10:19:17 AM See Physician within 4 Hours (or PCP triage) Yes Lars Pinks, RN, Theadore Nan Understands: Yes Disagree/Comply: Comply Care Advice Given Per Guideline SEE PHYSICIAN WITHIN 4 HOURS (or PCP triage): CALL BACK IF: * You become worse. CARE ADVICE given per Cough - Acute Non-Productive (Adult) guideline.

## 2015-11-04 ENCOUNTER — Emergency Department (HOSPITAL_COMMUNITY): Payer: Commercial Managed Care - HMO

## 2015-11-04 ENCOUNTER — Encounter (HOSPITAL_COMMUNITY): Payer: Self-pay | Admitting: Family Medicine

## 2015-11-04 ENCOUNTER — Observation Stay (HOSPITAL_COMMUNITY)
Admission: EM | Admit: 2015-11-04 | Discharge: 2015-11-08 | Disposition: A | Discharge status: Home / Self Care | Payer: Commercial Managed Care - HMO | Attending: Internal Medicine | Admitting: Internal Medicine

## 2015-11-04 DIAGNOSIS — S0990XA Unspecified injury of head, initial encounter: Secondary | ICD-10-CM | POA: Diagnosis not present

## 2015-11-04 DIAGNOSIS — S32592A Other specified fracture of left pubis, initial encounter for closed fracture: Secondary | ICD-10-CM | POA: Diagnosis not present

## 2015-11-04 DIAGNOSIS — S32810A Multiple fractures of pelvis with stable disruption of pelvic ring, initial encounter for closed fracture: Principal | ICD-10-CM | POA: Insufficient documentation

## 2015-11-04 DIAGNOSIS — IMO0002 Reserved for concepts with insufficient information to code with codable children: Secondary | ICD-10-CM

## 2015-11-04 DIAGNOSIS — Y9389 Activity, other specified: Secondary | ICD-10-CM | POA: Insufficient documentation

## 2015-11-04 DIAGNOSIS — K219 Gastro-esophageal reflux disease without esophagitis: Secondary | ICD-10-CM | POA: Diagnosis not present

## 2015-11-04 DIAGNOSIS — R935 Abnormal findings on diagnostic imaging of other abdominal regions, including retroperitoneum: Secondary | ICD-10-CM | POA: Diagnosis present

## 2015-11-04 DIAGNOSIS — H3530 Unspecified macular degeneration: Secondary | ICD-10-CM | POA: Diagnosis not present

## 2015-11-04 DIAGNOSIS — S32591A Other specified fracture of right pubis, initial encounter for closed fracture: Secondary | ICD-10-CM | POA: Diagnosis not present

## 2015-11-04 DIAGNOSIS — S3219XA Other fracture of sacrum, initial encounter for closed fracture: Secondary | ICD-10-CM | POA: Diagnosis not present

## 2015-11-04 DIAGNOSIS — G894 Chronic pain syndrome: Secondary | ICD-10-CM | POA: Diagnosis not present

## 2015-11-04 DIAGNOSIS — S3282XA Multiple fractures of pelvis without disruption of pelvic ring, initial encounter for closed fracture: Secondary | ICD-10-CM | POA: Diagnosis present

## 2015-11-04 DIAGNOSIS — Z79899 Other long term (current) drug therapy: Secondary | ICD-10-CM | POA: Insufficient documentation

## 2015-11-04 DIAGNOSIS — E079 Disorder of thyroid, unspecified: Secondary | ICD-10-CM | POA: Insufficient documentation

## 2015-11-04 DIAGNOSIS — D509 Iron deficiency anemia, unspecified: Secondary | ICD-10-CM | POA: Diagnosis present

## 2015-11-04 DIAGNOSIS — M1990 Unspecified osteoarthritis, unspecified site: Secondary | ICD-10-CM | POA: Insufficient documentation

## 2015-11-04 DIAGNOSIS — Y92009 Unspecified place in unspecified non-institutional (private) residence as the place of occurrence of the external cause: Secondary | ICD-10-CM

## 2015-11-04 DIAGNOSIS — Y9289 Other specified places as the place of occurrence of the external cause: Secondary | ICD-10-CM | POA: Insufficient documentation

## 2015-11-04 DIAGNOSIS — W19XXXA Unspecified fall, initial encounter: Secondary | ICD-10-CM

## 2015-11-04 DIAGNOSIS — E039 Hypothyroidism, unspecified: Secondary | ICD-10-CM | POA: Diagnosis present

## 2015-11-04 DIAGNOSIS — W01198A Fall on same level from slipping, tripping and stumbling with subsequent striking against other object, initial encounter: Secondary | ICD-10-CM | POA: Diagnosis not present

## 2015-11-04 DIAGNOSIS — K8689 Other specified diseases of pancreas: Secondary | ICD-10-CM | POA: Diagnosis present

## 2015-11-04 DIAGNOSIS — E46 Unspecified protein-calorie malnutrition: Secondary | ICD-10-CM | POA: Diagnosis present

## 2015-11-04 DIAGNOSIS — I10 Essential (primary) hypertension: Secondary | ICD-10-CM | POA: Diagnosis not present

## 2015-11-04 DIAGNOSIS — S0101XA Laceration without foreign body of scalp, initial encounter: Secondary | ICD-10-CM | POA: Insufficient documentation

## 2015-11-04 DIAGNOSIS — R9431 Abnormal electrocardiogram [ECG] [EKG]: Secondary | ICD-10-CM | POA: Diagnosis present

## 2015-11-04 DIAGNOSIS — M549 Dorsalgia, unspecified: Secondary | ICD-10-CM

## 2015-11-04 DIAGNOSIS — Y998 Other external cause status: Secondary | ICD-10-CM | POA: Diagnosis not present

## 2015-11-04 DIAGNOSIS — K3189 Other diseases of stomach and duodenum: Secondary | ICD-10-CM | POA: Diagnosis not present

## 2015-11-04 DIAGNOSIS — J449 Chronic obstructive pulmonary disease, unspecified: Secondary | ICD-10-CM | POA: Insufficient documentation

## 2015-11-04 DIAGNOSIS — S79912A Unspecified injury of left hip, initial encounter: Secondary | ICD-10-CM | POA: Diagnosis present

## 2015-11-04 DIAGNOSIS — I4510 Unspecified right bundle-branch block: Secondary | ICD-10-CM | POA: Diagnosis present

## 2015-11-04 DIAGNOSIS — K838 Other specified diseases of biliary tract: Secondary | ICD-10-CM | POA: Diagnosis not present

## 2015-11-04 DIAGNOSIS — S32512A Fracture of superior rim of left pubis, initial encounter for closed fracture: Secondary | ICD-10-CM | POA: Diagnosis not present

## 2015-11-04 LAB — URINALYSIS, ROUTINE W REFLEX MICROSCOPIC
Bilirubin Urine: NEGATIVE
GLUCOSE, UA: NEGATIVE mg/dL
KETONES UR: NEGATIVE mg/dL
LEUKOCYTES UA: NEGATIVE
Nitrite: NEGATIVE
PH: 6 (ref 5.0–8.0)
Protein, ur: 30 mg/dL — AB
SPECIFIC GRAVITY, URINE: 1.02 (ref 1.005–1.030)

## 2015-11-04 LAB — CBC WITH DIFFERENTIAL/PLATELET
BASOS ABS: 0 10*3/uL (ref 0.0–0.1)
BASOS ABS: 0 10*3/uL (ref 0.0–0.1)
BASOS PCT: 0 %
Basophils Relative: 0 %
EOS ABS: 0 10*3/uL (ref 0.0–0.7)
Eosinophils Absolute: 0 10*3/uL (ref 0.0–0.7)
Eosinophils Relative: 0 %
Eosinophils Relative: 0 %
HEMATOCRIT: 37.4 % (ref 36.0–46.0)
HEMATOCRIT: 37.8 % (ref 36.0–46.0)
HEMOGLOBIN: 12.3 g/dL (ref 12.0–15.0)
HEMOGLOBIN: 12.1 g/dL (ref 12.0–15.0)
LYMPHS PCT: 10 %
Lymphocytes Relative: 9 %
Lymphs Abs: 0.8 10*3/uL (ref 0.7–4.0)
Lymphs Abs: 0.8 10*3/uL (ref 0.7–4.0)
MCH: 30.1 pg (ref 26.0–34.0)
MCH: 29.3 pg (ref 26.0–34.0)
MCHC: 32.9 g/dL (ref 30.0–36.0)
MCHC: 32 g/dL (ref 30.0–36.0)
MCV: 91.4 fL (ref 78.0–100.0)
MCV: 91.5 fL (ref 78.0–100.0)
MONO ABS: 0.4 10*3/uL (ref 0.1–1.0)
MONOS PCT: 4 %
Monocytes Absolute: 0.3 10*3/uL (ref 0.1–1.0)
Monocytes Relative: 3 %
NEUTROS ABS: 7.4 10*3/uL (ref 1.7–7.7)
NEUTROS ABS: 7.4 10*3/uL (ref 1.7–7.7)
NEUTROS PCT: 88 %
Neutrophils Relative %: 86 %
Platelets: 253 10*3/uL (ref 150–400)
Platelets: 253 10*3/uL (ref 150–400)
RBC: 4.09 MIL/uL (ref 3.87–5.11)
RBC: 4.13 MIL/uL (ref 3.87–5.11)
RDW: 13.3 % (ref 11.5–15.5)
RDW: 13.5 % (ref 11.5–15.5)
WBC: 8.7 10*3/uL (ref 4.0–10.5)
WBC: 8.4 10*3/uL (ref 4.0–10.5)

## 2015-11-04 LAB — PHOSPHORUS: PHOSPHORUS: 3.7 mg/dL (ref 2.5–4.6)

## 2015-11-04 LAB — URINE MICROSCOPIC-ADD ON

## 2015-11-04 LAB — BASIC METABOLIC PANEL
ANION GAP: 12 (ref 5–15)
BUN: 14 mg/dL (ref 6–20)
CALCIUM: 9.5 mg/dL (ref 8.9–10.3)
CHLORIDE: 105 mmol/L (ref 101–111)
CO2: 23 mmol/L (ref 22–32)
Creatinine, Ser: 0.7 mg/dL (ref 0.44–1.00)
GFR calc Af Amer: >60 mL/min (ref >60)
GFR calc non Af Amer: >60 mL/min (ref >60)
GLUCOSE: 196 mg/dL — AB (ref 65–99)
Potassium: 3.7 mmol/L (ref 3.5–5.1)
Sodium: 140 mmol/L (ref 135–145)

## 2015-11-04 LAB — MAGNESIUM: Magnesium: 1.8 mg/dL (ref 1.7–2.4)

## 2015-11-04 LAB — TSH: TSH: 12.377 u[IU]/mL — ABNORMAL HIGH (ref 0.350–4.500)

## 2015-11-04 LAB — LIPASE, BLOOD: LIPASE: 72 U/L — AB (ref 11–51)

## 2015-11-04 LAB — CALCIUM: Calcium: 9.4 mg/dL (ref 8.9–10.3)

## 2015-11-04 MED ORDER — LEVOTHYROXINE SODIUM 75 MCG PO TABS
75.0000 ug | ORAL_TABLET | Freq: Every day | ORAL | Status: DC
  Administered 2015-11-05: 75 ug via ORAL
  Filled 2015-11-04: qty 1

## 2015-11-04 MED ORDER — ENSURE ENLIVE PO LIQD
237.0000 mL | Freq: Two times a day (BID) | ORAL | Status: DC
  Administered 2015-11-05 – 2015-11-08 (×7): 237 mL via ORAL

## 2015-11-04 MED ORDER — MORPHINE SULFATE (PF) 2 MG/ML IV SOLN
1.0000 mg | INTRAVENOUS | Status: DC | PRN
  Administered 2015-11-04 – 2015-11-06 (×4): 1 mg via INTRAVENOUS
  Administered 2015-11-07: 0.5 mg via INTRAVENOUS
  Filled 2015-11-04 (×5): qty 1

## 2015-11-04 MED ORDER — OXYCODONE-ACETAMINOPHEN 5-325 MG PO TABS
1.0000 | ORAL_TABLET | Freq: Once | ORAL | Status: AC
  Administered 2015-11-04: 1 via ORAL

## 2015-11-04 MED ORDER — ACETAMINOPHEN 650 MG RE SUPP: 650.0000 mg | Freq: Four times a day (QID) | RECTAL | Status: DC | PRN

## 2015-11-04 MED ORDER — SODIUM CHLORIDE 0.9% FLUSH
3.0000 mL | Freq: Two times a day (BID) | INTRAVENOUS | Status: DC
  Administered 2015-11-04 – 2015-11-08 (×6): 3 mL via INTRAVENOUS

## 2015-11-04 MED ORDER — IOHEXOL 300 MG/ML  SOLN
80.0000 mL | Freq: Once | INTRAMUSCULAR | Status: AC | PRN
  Administered 2015-11-04: 80 mL via INTRAVENOUS

## 2015-11-04 MED ORDER — OXYCODONE-ACETAMINOPHEN 5-325 MG PO TABS
ORAL_TABLET | ORAL | Status: AC
  Filled 2015-11-04: qty 1

## 2015-11-04 MED ORDER — ACETAMINOPHEN 325 MG PO TABS
650.0000 mg | ORAL_TABLET | Freq: Once | ORAL | Status: AC
  Administered 2015-11-04: 650 mg via ORAL
  Filled 2015-11-04: qty 2

## 2015-11-04 MED ORDER — FENTANYL CITRATE (PF) 100 MCG/2ML IJ SOLN
50.0000 ug | Freq: Once | INTRAMUSCULAR | Status: AC
  Administered 2015-11-04: 50 ug via INTRAVENOUS
  Filled 2015-11-04: qty 2

## 2015-11-04 MED ORDER — DOCUSATE SODIUM 100 MG PO CAPS
100.0000 mg | ORAL_CAPSULE | Freq: Two times a day (BID) | ORAL | Status: DC
  Administered 2015-11-04 – 2015-11-06 (×4): 100 mg via ORAL
  Filled 2015-11-04 (×4): qty 1

## 2015-11-04 MED ORDER — ONDANSETRON HCL 4 MG/2ML IJ SOLN
4.0000 mg | Freq: Once | INTRAMUSCULAR | Status: AC
  Administered 2015-11-04: 4 mg via INTRAVENOUS
  Filled 2015-11-04: qty 2

## 2015-11-04 MED ORDER — BISACODYL 10 MG RE SUPP: 10.0000 mg | Freq: Every day | RECTAL | Status: DC | PRN

## 2015-11-04 MED ORDER — PROMETHAZINE HCL 25 MG/ML IJ SOLN
6.2500 mg | Freq: Four times a day (QID) | INTRAMUSCULAR | Status: DC | PRN
  Administered 2015-11-04: 6.25 mg via INTRAVENOUS
  Filled 2015-11-04: qty 1

## 2015-11-04 MED ORDER — ADULT MULTIVITAMIN W/MINERALS CH
1.0000 | ORAL_TABLET | Freq: Every day | ORAL | Status: DC
  Administered 2015-11-05 – 2015-11-08 (×4): 1 via ORAL
  Filled 2015-11-04 (×4): qty 1

## 2015-11-04 MED ORDER — OXYCODONE HCL 5 MG PO TABS
5.0000 mg | ORAL_TABLET | ORAL | Status: DC | PRN
  Administered 2015-11-04 – 2015-11-08 (×10): 5 mg via ORAL
  Filled 2015-11-04 (×10): qty 1

## 2015-11-04 MED ORDER — ONDANSETRON HCL 4 MG PO TABS: 4.0000 mg | ORAL_TABLET | Freq: Four times a day (QID) | ORAL | Status: DC | PRN

## 2015-11-04 MED ORDER — SODIUM CHLORIDE 0.9 % IV SOLN
INTRAVENOUS | Status: DC
  Administered 2015-11-05 – 2015-11-06 (×2): via INTRAVENOUS

## 2015-11-04 MED ORDER — LISINOPRIL 20 MG PO TABS
20.0000 mg | ORAL_TABLET | Freq: Every day | ORAL | Status: DC
  Administered 2015-11-05 – 2015-11-08 (×4): 20 mg via ORAL
  Filled 2015-11-04 (×4): qty 1

## 2015-11-04 MED ORDER — ACETAMINOPHEN 325 MG PO TABS
650.0000 mg | ORAL_TABLET | Freq: Four times a day (QID) | ORAL | Status: DC | PRN
  Administered 2015-11-07: 650 mg via ORAL
  Filled 2015-11-04: qty 2

## 2015-11-04 MED ORDER — FLEET ENEMA 7-19 GM/118ML RE ENEM
1.0000 | ENEMA | Freq: Once | RECTAL | Status: DC | PRN
  Filled 2015-11-04: qty 1

## 2015-11-04 MED ORDER — MAGNESIUM HYDROXIDE 400 MG/5ML PO SUSP: 30.0000 mL | Freq: Every day | ORAL | Status: DC | PRN

## 2015-11-04 MED ORDER — ALUM & MAG HYDROXIDE-SIMETH 200-200-20 MG/5ML PO SUSP: 30.0000 mL | Freq: Four times a day (QID) | ORAL | Status: DC | PRN

## 2015-11-04 MED ORDER — ONDANSETRON HCL 4 MG/2ML IJ SOLN
4.0000 mg | Freq: Four times a day (QID) | INTRAMUSCULAR | Status: DC | PRN
  Administered 2015-11-04: 4 mg via INTRAVENOUS
  Filled 2015-11-04: qty 2

## 2015-11-04 NOTE — H&P (Signed)
[facility] History and Physical                                                                                    Katherine Flynn, is a 82 y.o. female  MRN: [id number]   DOB - [birth year]  Admit Date - 11/04/2015  Outpatient Primary MD for the patient is Rogelia Boga, MD  Referring MD: Adela Lank / ER  Consulting M.D: [facility]  PMH: Past Medical History  Diagnosis Date  . GERD (gastroesophageal reflux disease)   . Hypertension   . Thyroid disease   . COPD (chronic obstructive pulmonary disease) (HCC)   . Osteoarthritis   . Weight loss   . History of pyloric stenosis   . Chronic pain syndrome   . Macular degeneration       PSH: Past Surgical History  Procedure Laterality Date  . Appendectomy    . Cataract extraction    . Orif hip fracture    . Tubal ligation    . Nissan fundoplication  08/2007     CC:  Chief Complaint  Patient presents with  . Fall     HPI: 82 year old female patient with history of dementia, hypertension, hypothyroidism, iron deficiency anemia and previous falls with closed fracture of right iliac wing requiring rehabilitative therapy who presents to the hospital with pelvic pain after fall at home. Patient became unsteady on her feet was having difficulty moving her feet and her feet became entangled she slipped and fell last night. She is complaining of both hips hurting and with areas of "bumps" on the back of her head. She lives alone and has not started any new medications. She chronically has poor oral intake. She did not have any dizziness weakness chest pain or shortness of breath prior to fall.  ER Evaluation and treatment: Temperature 97.9-BP 162/77-pulse 91-respirations 18-room air saturations 93-94% EKG: Sinus tachycardia with ventricular rate 102 bpm, QTC 500 ms, with peaked P waves and right bundle branch block XR Hips: Bilateral pubic gram of fractures with acute fractures involving the left  superior pubic ramus and right inferior pubic ramus pressure fracture of the right sacrum CT lumbar spine: No evidence for acute lumbar spine injury CT abdomen and pelvis: Probable mucocele superior segment left lower lobe measuring 1.4 x 0.9 cm; fractures in the pelvis (acute fracture right ischium medially as well as acute fracture left medius appear pubic ramus) with apparent hemorrhage and anterior pelvis-no obvious disruption of the urinary bladder-no apparent sacral fracture; incidental finding of prominence of the pancreatic duct and the pancreatic head region as well as biliary duct dilatation without a well-defined mass being visualized with radiologist recommending nonemergent pre-and multiphasic postcontrast MRI or CT of the pancreas CT the head without contrast: No acute intercranial findings with stable chronic atrophy and periventricular white matter disease Laboratory data: Na 140, K 3.7, BUN 14, Cr 0.70, glucose 196, WBC 8700, hemoglobin 12.3, platelets 253,000, neutrophils 86% of normal absolute neutrophils, urinalysis cloudy with few back. 2., Trace hemoglobin, protein 30, nitrite negative leukocyte negative, WBCs 0-5 Percocet 5-3 25 by mouth 1 Fentanyl 50 g IV 2 doses Zofran 4 mg IV 1 Tylenol  650 mg by mouth 1  Review of Systems   In addition to the HPI above,  No Fever-chills, myalgias or other constitutional symptoms No Headache, changes with Vision or hearing, new weakness, tingling, numbness in any extremity, No problems swallowing food or Liquids, indigestion/reflux No Chest pain, Cough or Shortness of Breath, palpitations, orthopnea or DOE No Abdominal pain, N/V; no melena or hematochezia, no dark tarry stools, Bowel movements are regular, No dysuria, hematuria or flank pain No new skin rashes, lesions, masses or bruises, No new joints pains-aches No recent weight gain or loss No polyuria, polydypsia or polyphagia,  *A full 10 point Review of Systems was done,  except as stated above, all other Review of Systems were negative.  Social History Social History  Substance Use Topics  . Smoking status: Never Smoker   . Smokeless tobacco: Never Used  . Alcohol Use: No    Resides at: Private residence  Lives with: Alone  Ambulatory status: Rolling walker or cane   Family History History reviewed. No pertinent family history. because of her underlying dementia patient unable to tell me any pertinent family history such as anemia, CAD, stroke or hypertension, diabetes   Prior to Admission medications   Medication Sig Start Date End Date Taking? Authorizing Provider  levothyroxine (SYNTHROID, LEVOTHROID) 75 MCG tablet TAKE ONE TABLET BY MOUTH ONCE DAILY 06/07/15  Yes Gordy Savers, MD  lisinopril (PRINIVIL,ZESTRIL) 20 MG tablet TAKE ONE TABLET BY MOUTH ONCE DAILY 06/07/15  Yes Gordy Savers, MD  Multiple Vitamin (MULTIVITAMIN WITH MINERALS) TABS tablet Take 1 tablet by mouth daily.   Yes Historical Provider, MD  docusate sodium (COLACE) 100 MG capsule Take 1 capsule (100 mg total) by mouth 2 (two) times daily. Patient not taking: Reported on 11/04/2015 04/21/15   Leroy Sea, MD  metoprolol tartrate (LOPRESSOR) 25 MG tablet Take 1 tablet (25 mg total) by mouth 2 (two) times daily. Patient not taking: Reported on 11/04/2015 04/21/15   Leroy Sea, MD    No Known Allergies  Physical Exam  Vitals  Blood pressure 169/80, pulse 97, temperature 97.9 F (36.6 C), temperature source Oral, resp. rate 15, SpO2 93 %.   General:  In no acute distress, appears stated age chronically ill and malnourished  Psych: Flat-sleepy affect, awakens to voice and complains of pain in hips, oriented to name only  Neuro:   No focal neurological deficits, CN II through XII intact, Strength 5/5 in upper extremities-decreased to 3-4/5 in lower extremity secondary to pelvic pain exacerbated by movement of legs, Sensation intact all 4  extremities.  ENT:  Ears and Eyes appear Normal, Conjunctivae clear, PER. Dry oral mucosa without erythema or exudates. Patient does have contusions posterior head  Neck:  Supple, No lymphadenopathy appreciated  Respiratory:  Symmetrical chest wall movement, Good air movement bilaterally, CTAB. Room Air  Cardiac:  RRR, No Murmurs, no LE edema noted, no JVD, No carotid bruits, peripheral pulses palpable at 2+  Abdomen:  Positive bowel sounds, Soft, Non tender, Non distended,  No masses appreciated, no obvious hepatosplenomegaly  Pelvis: No external bruising noted, patient reports significant pain especially over right hip anteriorly with minimal palpation  Skin:  No Cyanosis, Normal Skin Turgor, No Skin Rash or Bruise.  Extremities: Symmetrical without obvious trauma or injury,  no effusions.  Data Review  CBC  Recent Labs Lab 11/04/15 1019  WBC 8.7  HGB 12.3  HCT 37.4  PLT 253  MCV 91.4  MCH 30.1  MCHC 32.9  RDW 13.3  LYMPHSABS 0.8  MONOABS 0.4  EOSABS 0.0  BASOSABS 0.0    Chemistries   Recent Labs Lab 11/04/15 1019  NA 140  K 3.7  CL 105  CO2 23  GLUCOSE 196*  BUN 14  CREATININE 0.70  CALCIUM 9.5    CrCl cannot be calculated (Unknown ideal weight.).  No results for input(s): TSH, T4TOTAL, T3FREE, THYROIDAB in the last 72 hours.  Invalid input(s): FREET3  Coagulation profile No results for input(s): INR, PROTIME in the last 168 hours.  No results for input(s): DDIMER in the last 72 hours.  Cardiac Enzymes No results for input(s): CKMB, TROPONINI, MYOGLOBIN in the last 168 hours.  Invalid input(s): CK  Invalid input(s): POCBNP  Urinalysis    Component Value Date/Time   COLORURINE YELLOW 11/04/2015 1135   APPEARANCEUR CLOUDY* 11/04/2015 1135   LABSPEC 1.020 11/04/2015 1135   PHURINE 6.0 11/04/2015 1135   GLUCOSEU NEGATIVE 11/04/2015 1135   HGBUR TRACE* 11/04/2015 1135   BILIRUBINUR NEGATIVE 11/04/2015 1135   KETONESUR NEGATIVE 11/04/2015  1135   PROTEINUR 30* 11/04/2015 1135   UROBILINOGEN 0.2 04/20/2015 1452   NITRITE NEGATIVE 11/04/2015 1135   LEUKOCYTESUR NEGATIVE 11/04/2015 1135    Imaging results:   Ct Head Wo Contrast  11/04/2015  CLINICAL DATA:  Patient fell last night injuring head and both hips. EXAM: CT HEAD WITHOUT CONTRAST TECHNIQUE: Contiguous axial images were obtained from the base of the skull through the vertex without intravenous contrast. COMPARISON:  CT head 02/10/2015. FINDINGS: Brain: There is no evidence of acute intracranial hemorrhage, mass lesion, brain edema or extra-axial fluid collection. The ventricles and subarachnoid spaces are diffusely prominent but stable. There is no CT evidence of acute cortical infarction. There is stable confluent low-density within the periventricular white matter. Intracranial vascular calcifications are noted. Bones/sinuses/visualized face: The visualized paranasal sinuses, mastoid air cells and middle ears are clear. The calvarium is intact. The right parietal scalp hematoma demonstrated previously has resolved. IMPRESSION: No acute intracranial findings. Stable chronic atrophy and periventricular white matter disease, likely related to chronic small vessel ischemic changes. Electronically Signed   By: Carey Bullocks M.D.   On: 11/04/2015 12:21   Ct Abdomen Pelvis W Contrast  11/04/2015  CLINICAL DATA:  Pain following fall 1 day prior EXAM: CT ABDOMEN AND PELVIS WITH CONTRAST TECHNIQUE: Multidetector CT imaging of the abdomen and pelvis was performed using the standard protocol following bolus administration of intravenous contrast. CONTRAST:  80mL OMNIPAQUE IOHEXOL 300 MG/ML  SOLN COMPARISON:  Pelvis and hip radiographs November 04, 2015; CT abdomen and pelvis July 06, 2015 FINDINGS: Lower chest: There is scarring in each lung base region. There is a probable mucocele in the superior segment left lower lobe measuring 1.4 x 0.9 cm. There is no basilar pneumothorax. There is  atelectatic change in the medial segment of the right middle lobe adjacent to the heart border. There is lower lobe bronchiectatic change bilaterally. There is extensive coronary artery calcification as well as mitral annular calcification. Hepatobiliary: The liver appears intact without laceration or rupture. No perihepatic fluid is identified. No focal liver lesions are identified. The gallbladder is mildly distended without appreciable wall thickening. There is mild intrahepatic biliary duct dilatation as well as generalized common bile duct dilatation. The distal common bile duct measures 10 mm. No biliary duct mass or calculus is evident. Pancreas: There is no pancreatic inflammatory focus or traumatic lesion. There is dilatation of the pancreatic duct in the pancreatic  head region to approximately 6 mm. No well-defined pancreatic mass seen. Adrenals/Urinary Tract: No adrenal lesions are identified. There are small cysts in the upper pole of each kidney. There is a cyst in the lower pole of the right kidney measuring 3.5 x 3.1 cm. There is a cyst in the mid left kidney measuring 1.3 x 0.9 cm. There is no contrast extravasation or perinephric stranding on either side. No renal or ureteral calculi. There is no appreciable wall thickening involving the urinary bladder. There is complex fluid immediately adjacent to the anterior and leftward aspect of the urinary bladder. Stomach/Bowel: There is wall thickening in the distal stomach and proximal duodenum. No other bowel wall thickening is seen. No appreciable mesenteric thickening. No bowel obstruction. No free air or portal venous air. Vascular/Lymphatic: Aorta is diffusely tortuous but nonaneurysmal. There is extensive atherosclerotic calcification throughout the aorta and iliac arteries. There is also moderate calcification in the proximal mesenteric vessels bilaterally. No contrast extravasation is noted surrounding the major mesenteric vessels. No fluid  collections in these areas. No adenopathy appreciable in the abdomen or pelvis. Reproductive: Uterus appears absent. No pelvic mass or free pelvic fluid. There is fluid in the anterior pelvis near the bladder as noted above. Other: Appendix appears normal. No abscess or ascites in the abdomen or pelvis. Musculoskeletal: Total hip replacement noted on the right. There is an old fracture of the mid right iliac crest. There is a fracture of the superior, lateral right sacral ala without appreciable surrounding hemorrhage. There is is an acute fracture of the right ischium medially as well as an acute fracture of the left medial superior pubic ramus. There is an apparent old fracture of the right superior pubic ramus. There is apparent hemorrhage anterior to the pubic symphysis as well as slightly superior to the fracture of the superior pubic ramus on the left. There is advanced lumbar arthropathy. There is no intramuscular or abdominal wall lesion. IMPRESSION: There are fractures in the pelvis with apparent hemorrhage in the anterior pelvis. Fluid is noted adjacent to the urinary bladder anteriorly and toward the left. This fluid is probably secondary to the pelvic fractures. There is no obvious disruption of the urinary bladder. If the patient has some suggesting potential bladder injury, however, correlation with cystoscopy or cystography may be reasonable. There is prominence of the pancreatic duct in the pancreatic head region as well as biliary duct dilatation. A well-defined mass in the head of the pancreas is not seen. These findings, however, are concerning for a potential small pancreatic mass. Given these findings, nonemergent pre and multiphasic post-contrast MR or CT of the pancreas would be advisable. The Wall thickening in the distal stomach and proximal duodenum. Probable gastritis and proximal duodenitis. No obvious bowel injury. Bronchiectatic change in the lung bases. Probable mucocele in the  inferior aspect of the superior segment left lower lobe. Advise follow-up chest CT in 8-10 weeks to assess for stability of this finding. Extensive atherosclerotic calcification. Electronically Signed   By: Bretta Bang III M.D.   On: 11/04/2015 13:12   Ct L-spine No Charge  11/04/2015  CLINICAL DATA:  Fall with back pain. EXAM: CT LUMBAR SPINE WITHOUT CONTRAST TECHNIQUE: Coned in and reformatted images of the lumbar spine were obtained from abdominal CT data set. COMPARISON:  None. FINDINGS: Abdominal CT reported separately. Bronchiectatic changes at the left base are well seen on these coned in images. There is no evidence of lumbar spine fracture or traumatic malalignment. No gross  canal hematoma. Lumbar exam there is a partly visualized sacral ala fracture on the right, not involving sacral foramina. There has been a remote right iliac wing fracture. Right obturator ring fractures reported separately. There is diffuse and advanced degenerative disc and facet disease with levoscoliosis. There is left-sided L4 and L5 foraminal compression due to scoliosis and spurring. Multilevel disc bulging and canal narrowing. IMPRESSION: 1. No evidence of acute lumbar spine injury. 2. Reference abdominal CT report concerning right sacral ala and obturator ring fractures. Electronically Signed   By: Marnee Spring M.D.   On: 11/04/2015 13:14   Dg Hips Bilat With Pelvis Min 5 Views  11/04/2015  CLINICAL DATA:  Larey Seat this morning.  Bilateral hip pain. EXAM: DG HIP (WITH OR WITHOUT PELVIS) 5+V BILAT COMPARISON:  CT 07/05/2005 and 04/18/2015 FINDINGS: Right hip arthroplasty is located. There is no evidence for an acute periprosthetic fracture. Left hip is located without a fracture. However, there is new irregularity involving the pubic rami bilaterally. There appears to be fractures involving the left superior pubic ramus and a mildly displaced fracture involving the right inferior pubic ramus. In addition, there may be  a new fracture involving the right side of the sacrum. Old deformity of the right superior pubic ramus. Again noted is sclerosis from old fracture in the right ilium. There is also an old fracture involving the right femur greater trochanter. Scoliosis and degenerative changes in the lower lumbar spine. IMPRESSION: Bilateral pubic rami fractures. Evidence for acute fractures involving the left superior pubic ramus and right inferior pubic ramus. Question a fracture of the right sacrum. Both hips are located. Old fractures involving the right ilium and right proximal femur. Electronically Signed   By: Richarda Overlie M.D.   On: 11/04/2015 10:39     EKG: (Independently reviewed)  Sinus tachycardia with ventricular rate 102 bpm, QTC 500 ms, with peaked P waves and right bundle branch block   Assessment & Plan  Principal Problem:   Multiple pelvic fractures/ Fall at home -Patient endorses no dizziness or other physiologic symptoms prior to fall and describes tripping over her feet therefore mechanical fall -Admit to telemetry/Obs -Orthopedic consultation pending -Noted with bleeding on CT scan secondary to pelvic fractures-EDP Dr. Adela Lank discussed with Dr. Lindie Spruce of trauma surgery who felt this was not significant bleeding and therefore appropriate for orthopedic surgery to manage the fractures and internal medicine to admit -Bedrest until evaluated by orthopedic team -Hopeful can begin PT/OT evaluations; patient with previous closed fracture right iliac wing that required short-term rehabilitative therapy before returning home -IV morphine for severe pain and oral oxy IR to moderate pain -Short-term IV fluids since has not eaten since yesterday evening (more than 12 hours ago) -Cycle CBC every 12 hours-next due 3 PM -No pharmacological DVT prophylaxis  Active Problems:   Hypothyroidism -Continue home Synthroid    Essential hypertension -Moderately controlled -Continue preadmission medications     Prolonged Q-T interval on ECG/RBBB -Follow on telemetry -Avoid offending medication    Iron deficiency anemia -Current hemoglobin stable and at baseline -See above regarding serial CBC    Protein calorie malnutrition  -Nutrition consultation    Dilated bile duct/Pancreatic duct dilated  -No documented history of prior cholecystectomy -Radiologist questions possible nonvisible pancreatic mass and recommends nonemergent MR or CT (pre and multiphasic postcontrast) of pancreas -Check lipase and CA 19.9    Abnormal findings CT abdomen/? Left pulmonary mucocele -Patient recently treated as an outpatient for nonbacterial bronchitis -Follow clinically -Currently  no cough or infectious symptoms    DVT Prophylaxis: SCDs  Family Communication:   Daughter at bedside  Code Status:  DO NOT RESUSCITATE  Condition:  Stable  Discharge disposition: Anticipate discharge to skilled nursing facility for rehabilitation pending evaluation by orthopedic physician and out, PT/OT evaluations  Time spent in minutes : 60      ELLIS,ALLISON L. ANP on 11/04/2015 at 4:00 PM  You may contact me by going to [email or web address] - password [email or web address]  I am available from 7a-7p but please confirm I am on the schedule by going to Amion as above.   After 7p please contact night coverage person covering me after hours  [facility]

## 2015-11-04 NOTE — ED Notes (Signed)
Attempted report 

## 2015-11-04 NOTE — ED Notes (Signed)
Pt here for slip and fall last night. Sts both hips hurts. Pt has bumps to back of head with dried blood.

## 2015-11-04 NOTE — ED Provider Notes (Addendum)
CSN: [id number]     Arrival date & time 11/04/15  0913 History   First MD Initiated Contact with Patient 11/04/15 1021     Chief Complaint  Patient presents with  . Fall     (Consider location/radiation/quality/duration/timing/severity/associated sxs/prior Treatment) Patient is a 81 y.o. female presenting with fall.  Fall This is a new problem. The current episode started 1 to 2 hours ago. The problem occurs constantly. The problem has not changed since onset.Pertinent negatives include no chest pain, no headaches and no shortness of breath. Nothing aggravates the symptoms. Nothing relieves the symptoms. She has tried nothing for the symptoms. The treatment provided no relief.   82 yo F  With a chief complaint of fall. Patient thinks that she slipped or maybe tripped over her own feet and then she hit the back of her head.  Is complaining mostly of pain to bilateral hips. Patient is having trouble getting up and moving around. Has been able to urinate without difficulty.  Also having some lower left-sided back pain. Denies radiation down the leg.  Denies abdominal pain chest pain shortness of breath.   Past Medical History  Diagnosis Date  . GERD (gastroesophageal reflux disease)   . Hypertension   . Thyroid disease   . COPD (chronic obstructive pulmonary disease) (HCC)   . Osteoarthritis   . Weight loss   . History of pyloric stenosis   . Chronic pain syndrome   . Macular degeneration    Past Surgical History  Procedure Laterality Date  . Appendectomy    . Cataract extraction    . Orif hip fracture    . Tubal ligation    . Nissan fundoplication  08/2007   History reviewed. No pertinent family history. Social History  Substance Use Topics  . Smoking status: Never Smoker   . Smokeless tobacco: Never Used  . Alcohol Use: No   OB History    Gravida Para Term Preterm AB TAB SAB Ectopic Multiple Living   12 9   3           Review of Systems  Constitutional: Negative for  fever and chills.  HENT: Negative for congestion and rhinorrhea.   Eyes: Negative for redness and visual disturbance.  Respiratory: Negative for shortness of breath and wheezing.   Cardiovascular: Negative for chest pain and palpitations.  Gastrointestinal: Negative for nausea and vomiting.  Genitourinary: Negative for dysuria and urgency.  Musculoskeletal: Positive for myalgias, arthralgias and gait problem.  Skin: Negative for pallor and wound.  Neurological: Negative for dizziness and headaches.      Allergies  Review of patient's allergies indicates no known allergies.  Home Medications   Prior to Admission medications   Medication Sig Start Date End Date Taking? Authorizing Provider  levothyroxine (SYNTHROID, LEVOTHROID) 75 MCG tablet TAKE ONE TABLET BY MOUTH ONCE DAILY 06/07/15  Yes Gordy Savers, MD  lisinopril (PRINIVIL,ZESTRIL) 20 MG tablet TAKE ONE TABLET BY MOUTH ONCE DAILY 06/07/15  Yes Gordy Savers, MD  Multiple Vitamin (MULTIVITAMIN WITH MINERALS) TABS tablet Take 1 tablet by mouth daily.   Yes Historical Provider, MD  docusate sodium (COLACE) 100 MG capsule Take 1 capsule (100 mg total) by mouth 2 (two) times daily. Patient not taking: Reported on 11/04/2015 04/21/15   Leroy Sea, MD  metoprolol tartrate (LOPRESSOR) 25 MG tablet Take 1 tablet (25 mg total) by mouth 2 (two) times daily. Patient not taking: Reported on 11/04/2015 04/21/15   Leroy Sea, MD  BP 169/80 mmHg  Pulse 97  Temp(Src) 97.9 F (36.6 C) (Oral)  Resp 15  SpO2 93% Physical Exam  Constitutional: She is oriented to person, place, and time. She appears well-developed and well-nourished. No distress.  HENT:  Head: Normocephalic and atraumatic.  Eyes: EOM are normal. Pupils are equal, round, and reactive to light.  Neck: Normal range of motion. Neck supple.  Cardiovascular: Normal rate and regular rhythm.  Exam reveals no gallop and no friction rub.   No murmur  heard. Pulmonary/Chest: Effort normal. She has no wheezes. She has no rales. She exhibits no tenderness.  Abdominal: Soft. She exhibits no distension. There is no tenderness. There is no rebound and no guarding.  Musculoskeletal: She exhibits tenderness (LL leg and RL leg). She exhibits no edema.  Neurological: She is alert and oriented to person, place, and time.  Skin: Skin is warm and dry. She is not diaphoretic.  Psychiatric: She has a normal mood and affect. Her behavior is normal.  Nursing note and vitals reviewed.   ED Course  .Marland KitchenLaceration Repair Date/Time: 11/04/2015 3:59 PM Performed by: Adela Lank Elbert Spickler Authorized by: Melene Plan Consent: Verbal consent obtained. Risks and benefits: risks, benefits and alternatives were discussed Consent given by: patient Required items: required blood products, implants, devices, and special equipment available Patient identity confirmed: verbally with patient Body area: head/neck Location details: scalp Laceration length: 1 cm Tendon involvement: none Nerve involvement: none Vascular damage: no Skin closure: 4-0 nylon Number of sutures: 3 Technique: simple Approximation: close Approximation difficulty: simple Patient tolerance: Patient tolerated the procedure well with no immediate complications   (including critical care time) Labs Review Labs Reviewed  BASIC METABOLIC PANEL - Abnormal; Notable for the following:    Glucose, Bld 196 (*)    All other components within normal limits  URINALYSIS, ROUTINE W REFLEX MICROSCOPIC (NOT AT [facility]) - Abnormal; Notable for the following:    APPearance CLOUDY (*)    Hgb urine dipstick TRACE (*)    Protein, ur 30 (*)    All other components within normal limits  URINE MICROSCOPIC-ADD ON - Abnormal; Notable for the following:    Squamous Epithelial / LPF 0-5 (*)    Bacteria, UA FEW (*)    All other components within normal limits  CBC WITH DIFFERENTIAL/PLATELET  CBC WITH DIFFERENTIAL/PLATELET   CBC WITH DIFFERENTIAL/PLATELET  CALCIUM  MAGNESIUM  PHOSPHORUS  TSH  LIPASE, BLOOD  CANCER ANTIGEN 19-9    Imaging Review Ct Head Wo Contrast  11/04/2015  CLINICAL DATA:  Patient fell last night injuring head and both hips. EXAM: CT HEAD WITHOUT CONTRAST TECHNIQUE: Contiguous axial images were obtained from the base of the skull through the vertex without intravenous contrast. COMPARISON:  CT head 02/10/2015. FINDINGS: Brain: There is no evidence of acute intracranial hemorrhage, mass lesion, brain edema or extra-axial fluid collection. The ventricles and subarachnoid spaces are diffusely prominent but stable. There is no CT evidence of acute cortical infarction. There is stable confluent low-density within the periventricular white matter. Intracranial vascular calcifications are noted. Bones/sinuses/visualized face: The visualized paranasal sinuses, mastoid air cells and middle ears are clear. The calvarium is intact. The right parietal scalp hematoma demonstrated previously has resolved. IMPRESSION: No acute intracranial findings. Stable chronic atrophy and periventricular white matter disease, likely related to chronic small vessel ischemic changes. Electronically Signed   By: Carey Bullocks M.D.   On: 11/04/2015 12:21   Ct Abdomen Pelvis W Contrast  11/04/2015  CLINICAL DATA:  Pain following  fall 1 day prior EXAM: CT ABDOMEN AND PELVIS WITH CONTRAST TECHNIQUE: Multidetector CT imaging of the abdomen and pelvis was performed using the standard protocol following bolus administration of intravenous contrast. CONTRAST:  80mL OMNIPAQUE IOHEXOL 300 MG/ML  SOLN COMPARISON:  Pelvis and hip radiographs November 04, 2015; CT abdomen and pelvis July 06, 2015 FINDINGS: Lower chest: There is scarring in each lung base region. There is a probable mucocele in the superior segment left lower lobe measuring 1.4 x 0.9 cm. There is no basilar pneumothorax. There is atelectatic change in the medial segment of  the right middle lobe adjacent to the heart border. There is lower lobe bronchiectatic change bilaterally. There is extensive coronary artery calcification as well as mitral annular calcification. Hepatobiliary: The liver appears intact without laceration or rupture. No perihepatic fluid is identified. No focal liver lesions are identified. The gallbladder is mildly distended without appreciable wall thickening. There is mild intrahepatic biliary duct dilatation as well as generalized common bile duct dilatation. The distal common bile duct measures 10 mm. No biliary duct mass or calculus is evident. Pancreas: There is no pancreatic inflammatory focus or traumatic lesion. There is dilatation of the pancreatic duct in the pancreatic head region to approximately 6 mm. No well-defined pancreatic mass seen. Adrenals/Urinary Tract: No adrenal lesions are identified. There are small cysts in the upper pole of each kidney. There is a cyst in the lower pole of the right kidney measuring 3.5 x 3.1 cm. There is a cyst in the mid left kidney measuring 1.3 x 0.9 cm. There is no contrast extravasation or perinephric stranding on either side. No renal or ureteral calculi. There is no appreciable wall thickening involving the urinary bladder. There is complex fluid immediately adjacent to the anterior and leftward aspect of the urinary bladder. Stomach/Bowel: There is wall thickening in the distal stomach and proximal duodenum. No other bowel wall thickening is seen. No appreciable mesenteric thickening. No bowel obstruction. No free air or portal venous air. Vascular/Lymphatic: Aorta is diffusely tortuous but nonaneurysmal. There is extensive atherosclerotic calcification throughout the aorta and iliac arteries. There is also moderate calcification in the proximal mesenteric vessels bilaterally. No contrast extravasation is noted surrounding the major mesenteric vessels. No fluid collections in these areas. No adenopathy  appreciable in the abdomen or pelvis. Reproductive: Uterus appears absent. No pelvic mass or free pelvic fluid. There is fluid in the anterior pelvis near the bladder as noted above. Other: Appendix appears normal. No abscess or ascites in the abdomen or pelvis. Musculoskeletal: Total hip replacement noted on the right. There is an old fracture of the mid right iliac crest. There is a fracture of the superior, lateral right sacral ala without appreciable surrounding hemorrhage. There is is an acute fracture of the right ischium medially as well as an acute fracture of the left medial superior pubic ramus. There is an apparent old fracture of the right superior pubic ramus. There is apparent hemorrhage anterior to the pubic symphysis as well as slightly superior to the fracture of the superior pubic ramus on the left. There is advanced lumbar arthropathy. There is no intramuscular or abdominal wall lesion. IMPRESSION: There are fractures in the pelvis with apparent hemorrhage in the anterior pelvis. Fluid is noted adjacent to the urinary bladder anteriorly and toward the left. This fluid is probably secondary to the pelvic fractures. There is no obvious disruption of the urinary bladder. If the patient has some suggesting potential bladder injury, however, correlation with cystoscopy  or cystography may be reasonable. There is prominence of the pancreatic duct in the pancreatic head region as well as biliary duct dilatation. A well-defined mass in the head of the pancreas is not seen. These findings, however, are concerning for a potential small pancreatic mass. Given these findings, nonemergent pre and multiphasic post-contrast MR or CT of the pancreas would be advisable. The Wall thickening in the distal stomach and proximal duodenum. Probable gastritis and proximal duodenitis. No obvious bowel injury. Bronchiectatic change in the lung bases. Probable mucocele in the inferior aspect of the superior segment left  lower lobe. Advise follow-up chest CT in 8-10 weeks to assess for stability of this finding. Extensive atherosclerotic calcification. Electronically Signed   By: Bretta Bang III M.D.   On: 11/04/2015 13:12   Ct L-spine No Charge  11/04/2015  CLINICAL DATA:  Fall with back pain. EXAM: CT LUMBAR SPINE WITHOUT CONTRAST TECHNIQUE: Coned in and reformatted images of the lumbar spine were obtained from abdominal CT data set. COMPARISON:  None. FINDINGS: Abdominal CT reported separately. Bronchiectatic changes at the left base are well seen on these coned in images. There is no evidence of lumbar spine fracture or traumatic malalignment. No gross canal hematoma. Lumbar exam there is a partly visualized sacral ala fracture on the right, not involving sacral foramina. There has been a remote right iliac wing fracture. Right obturator ring fractures reported separately. There is diffuse and advanced degenerative disc and facet disease with levoscoliosis. There is left-sided L4 and L5 foraminal compression due to scoliosis and spurring. Multilevel disc bulging and canal narrowing. IMPRESSION: 1. No evidence of acute lumbar spine injury. 2. Reference abdominal CT report concerning right sacral ala and obturator ring fractures. Electronically Signed   By: Marnee Spring M.D.   On: 11/04/2015 13:14   Dg Hips Bilat With Pelvis Min 5 Views  11/04/2015  CLINICAL DATA:  Larey Seat this morning.  Bilateral hip pain. EXAM: DG HIP (WITH OR WITHOUT PELVIS) 5+V BILAT COMPARISON:  CT 07/05/2005 and 04/18/2015 FINDINGS: Right hip arthroplasty is located. There is no evidence for an acute periprosthetic fracture. Left hip is located without a fracture. However, there is new irregularity involving the pubic rami bilaterally. There appears to be fractures involving the left superior pubic ramus and a mildly displaced fracture involving the right inferior pubic ramus. In addition, there may be a new fracture involving the right side of  the sacrum. Old deformity of the right superior pubic ramus. Again noted is sclerosis from old fracture in the right ilium. There is also an old fracture involving the right femur greater trochanter. Scoliosis and degenerative changes in the lower lumbar spine. IMPRESSION: Bilateral pubic rami fractures. Evidence for acute fractures involving the left superior pubic ramus and right inferior pubic ramus. Question a fracture of the right sacrum. Both hips are located. Old fractures involving the right ilium and right proximal femur. Electronically Signed   By: Richarda Overlie M.D.   On: 11/04/2015 10:39   I have personally reviewed and evaluated these images and lab results as part of my medical decision-making.   EKG Interpretation None      MDM   Final diagnoses:  Multiple closed fractures of pelvis with stable disruption of pelvic circle, initial encounter [facility])    82 yo  Female with a chief complaint of a fall. Patient  Was found to have bilateral pubic rami fractures. Patient with significant lower back pain as well obtain a CT scan Abdomen and pelvis  with contrast with L-spine reformats.    CT scan with possible active hemorrhage. This was discussed with Dr. Lindie Spruce , trauma surgery feels that this is unlikely to be a significant bleed on a ground-level fall. Orthopedics was consultation will evaluate the patient later in the state. We'll admit to internal medicine.  The patients results and plan were reviewed and discussed.   Any x-rays performed were independently reviewed by myself.   Differential diagnosis were considered with the presenting HPI.  Medications  0.9 %  sodium chloride infusion (not administered)  morphine 2 MG/ML injection 1 mg (1 mg Intravenous Given 11/04/15 1529)  oxyCODONE (Oxy IR/ROXICODONE) immediate release tablet 5 mg (not administered)  multivitamin with minerals tablet 1 tablet (not administered)  levothyroxine (SYNTHROID, LEVOTHROID) tablet 75 mcg (not  administered)  lisinopril (PRINIVIL,ZESTRIL) tablet 20 mg (not administered)  sodium chloride flush (NS) 0.9 % injection 3 mL (not administered)  acetaminophen (TYLENOL) tablet 650 mg (not administered)    Or  acetaminophen (TYLENOL) suppository 650 mg (not administered)  docusate sodium (COLACE) capsule 100 mg (not administered)  magnesium hydroxide (MILK OF MAGNESIA) suspension 30 mL (not administered)  bisacodyl (DULCOLAX) suppository 10 mg (not administered)  sodium phosphate (FLEET) 7-19 GM/118ML enema 1 enema (not administered)  ondansetron (ZOFRAN) injection 4 mg (4 mg Intravenous Given 11/04/15 1627)  alum & mag hydroxide-simeth (MAALOX/MYLANTA) 200-200-20 MG/5ML suspension 30 mL (not administered)  promethazine (PHENERGAN) injection 6.25 mg (not administered)  oxyCODONE-acetaminophen (PERCOCET/ROXICET) 5-325 MG per tablet 1 tablet (1 tablet Oral Given 11/04/15 0953)  fentaNYL (SUBLIMAZE) injection 50 mcg (50 mcg Intravenous Given 11/04/15 1023)  ondansetron (ZOFRAN) injection 4 mg (4 mg Intravenous Given 11/04/15 1023)  acetaminophen (TYLENOL) tablet 650 mg (650 mg Oral Given 11/04/15 1046)  fentaNYL (SUBLIMAZE) injection 50 mcg (50 mcg Intravenous Given 11/04/15 1046)  iohexol (OMNIPAQUE) 300 MG/ML solution 80 mL (80 mLs Intravenous Contrast Given 11/04/15 1151)    Filed Vitals:   11/04/15 1300 11/04/15 1330 11/04/15 1345 11/04/15 1415  BP: 138/73 133/66 144/71 169/80  Pulse: 100 100 99 97  Temp:      TempSrc:      Resp: SpO2: 95% 91% 91% 93%    Final diagnoses:  Multiple closed fractures of pelvis with stable disruption of pelvic circle, initial encounter [facility])    Admission/ observation were discussed with the admitting physician, patient and/or family and they are comfortable with the plan.     Melene Plan, DO 11/04/15 1631  Melene Plan, DO 11/12/15 [phone]

## 2015-11-04 NOTE — Consult Note (Signed)
Reason for Consult: Fall at home with nondisplaced left inferior pubic ramus and ischial tuberosity fractures Referring Physician: Amado Nash is an 82 y.o. female.  HPI: 82 year old household ambulator who fell at home today and sustained a nondisplaced, left superior pubic ramus, right inferior pubic ramus fracture, nondisplaced right sacral fracture, diagnosed by CT scan after transport by EMS. Has denied any loss of consciousness think she tripped over an object at her house. Patient known to me from another fall in August 2016 when she fell and sustained a right iliac wing fracture that is gone to heal. Patient is seen in the emergency department with her family who reports that she is falling at least 2 or 3 times a month and she is gotten to the point where she is now forgetting to take her medicines unless she receives help. CT scan shows a small amount of blood around the fracture sites her hemoglobin is 12.9.  Past Medical History  Diagnosis Date  . GERD (gastroesophageal reflux disease)   . Hypertension   . Thyroid disease   . COPD (chronic obstructive pulmonary disease) ([place])   . Osteoarthritis   . Weight loss   . History of pyloric stenosis   . Chronic pain syndrome   . Macular degeneration     Past Surgical History  Procedure Laterality Date  . Appendectomy    . Cataract extraction    . Orif hip fracture    . Tubal ligation    . Nissan fundoplication  16/1096    History reviewed. No pertinent family history.  Social History:  reports that she has never smoked. She has never used smokeless tobacco. She reports that she does not drink alcohol or use illicit drugs.  Allergies: No Known Allergies  Medications: I have reviewed the patient's current medications.  Results for orders placed or performed during the hospital encounter of 11/04/15 (from the past 48 hour(s))  CBC with Differential     Status: None   Collection Time: 11/04/15 10:19 AM  Result  Value Ref Range   WBC 8.7 4.0 - 10.5 K/uL   RBC 4.09 3.87 - 5.11 MIL/uL   Hemoglobin 12.3 12.0 - 15.0 g/dL   HCT 37.4 36.0 - 46.0 %   MCV 91.4 78.0 - 100.0 fL   MCH 30.1 26.0 - 34.0 pg   MCHC 32.9 30.0 - 36.0 g/dL   RDW 13.3 11.5 - 15.5 %   Platelets 253 150 - 400 K/uL   Neutrophils Relative % 86 %   Neutro Abs 7.4 1.7 - 7.7 K/uL   Lymphocytes Relative 10 %   Lymphs Abs 0.8 0.7 - 4.0 K/uL   Monocytes Relative 4 %   Monocytes Absolute 0.4 0.1 - 1.0 K/uL   Eosinophils Relative 0 %   Eosinophils Absolute 0.0 0.0 - 0.7 K/uL   Basophils Relative 0 %   Basophils Absolute 0.0 0.0 - 0.1 K/uL  Basic metabolic panel     Status: Abnormal   Collection Time: 11/04/15 10:19 AM  Result Value Ref Range   Sodium 140 135 - 145 mmol/L   Potassium 3.7 3.5 - 5.1 mmol/L   Chloride 105 101 - 111 mmol/L   CO2 23 22 - 32 mmol/L   Glucose, Bld 196 (H) 65 - 99 mg/dL   BUN 14 6 - 20 mg/dL   Creatinine, Ser 0.70 0.44 - 1.00 mg/dL   Calcium 9.5 8.9 - 10.3 mg/dL   GFR calc non Af Amer >60 >60  mL/min   GFR calc Af Amer >60 >60 mL/min    Comment: (NOTE) The eGFR has been calculated using the CKD EPI equation. This calculation has not been validated in all clinical situations. eGFR's persistently <60 mL/min signify possible Chronic Kidney Disease.    Anion gap 12 5 - 15  Urinalysis, Routine w reflex microscopic (not at [facility])     Status: Abnormal   Collection Time: 11/04/15 11:35 AM  Result Value Ref Range   Color, Urine YELLOW YELLOW   APPearance CLOUDY (A) CLEAR   Specific Gravity, Urine 1.020 1.005 - 1.030   pH 6.0 5.0 - 8.0   Glucose, UA NEGATIVE NEGATIVE mg/dL   Hgb urine dipstick TRACE (A) NEGATIVE   Bilirubin Urine NEGATIVE NEGATIVE   Ketones, ur NEGATIVE NEGATIVE mg/dL   Protein, ur 30 (A) NEGATIVE mg/dL   Nitrite NEGATIVE NEGATIVE   Leukocytes, UA NEGATIVE NEGATIVE  Urine microscopic-add on     Status: Abnormal   Collection Time: 11/04/15 11:35 AM  Result Value Ref Range   Squamous  Epithelial / LPF 0-5 (A) NONE SEEN   WBC, UA 0-5 0 - 5 WBC/hpf   RBC / HPF 0-5 0 - 5 RBC/hpf   Bacteria, UA FEW (A) NONE SEEN  CBC with Differential/Platelet     Status: None   Collection Time: 11/04/15  4:10 PM  Result Value Ref Range   WBC 8.4 4.0 - 10.5 K/uL   RBC 4.13 3.87 - 5.11 MIL/uL   Hemoglobin 12.1 12.0 - 15.0 g/dL   HCT 37.8 36.0 - 46.0 %   MCV 91.5 78.0 - 100.0 fL   MCH 29.3 26.0 - 34.0 pg   MCHC 32.0 30.0 - 36.0 g/dL   RDW 13.5 11.5 - 15.5 %   Platelets 253 150 - 400 K/uL   Neutrophils Relative % 88 %   Neutro Abs 7.4 1.7 - 7.7 K/uL   Lymphocytes Relative 9 %   Lymphs Abs 0.8 0.7 - 4.0 K/uL   Monocytes Relative 3 %   Monocytes Absolute 0.3 0.1 - 1.0 K/uL   Eosinophils Relative 0 %   Eosinophils Absolute 0.0 0.0 - 0.7 K/uL   Basophils Relative 0 %   Basophils Absolute 0.0 0.0 - 0.1 K/uL    Ct Head Wo Contrast  11/04/2015  CLINICAL DATA:  Patient fell last night injuring head and both hips. EXAM: CT HEAD WITHOUT CONTRAST TECHNIQUE: Contiguous axial images were obtained from the base of the skull through the vertex without intravenous contrast. COMPARISON:  CT head 02/10/2015. FINDINGS: Brain: There is no evidence of acute intracranial hemorrhage, mass lesion, brain edema or extra-axial fluid collection. The ventricles and subarachnoid spaces are diffusely prominent but stable. There is no CT evidence of acute cortical infarction. There is stable confluent low-density within the periventricular white matter. Intracranial vascular calcifications are noted. Bones/sinuses/visualized face: The visualized paranasal sinuses, mastoid air cells and middle ears are clear. The calvarium is intact. The right parietal scalp hematoma demonstrated previously has resolved. IMPRESSION: No acute intracranial findings. Stable chronic atrophy and periventricular white matter disease, likely related to chronic small vessel ischemic changes. Electronically Signed   By: Richardean Sale M.D.   On:  11/04/2015 12:21   Ct Abdomen Pelvis W Contrast  11/04/2015  CLINICAL DATA:  Pain following fall 1 day prior EXAM: CT ABDOMEN AND PELVIS WITH CONTRAST TECHNIQUE: Multidetector CT imaging of the abdomen and pelvis was performed using the standard protocol following bolus administration of intravenous contrast. CONTRAST:  6m OMNIPAQUE IOHEXOL 300 MG/ML  SOLN COMPARISON:  Pelvis and hip radiographs November 04, 2015; CT abdomen and pelvis July 06, 2015 FINDINGS: Lower chest: There is scarring in each lung base region. There is a probable mucocele in the superior segment left lower lobe measuring 1.4 x 0.9 cm. There is no basilar pneumothorax. There is atelectatic change in the medial segment of the right middle lobe adjacent to the heart border. There is lower lobe bronchiectatic change bilaterally. There is extensive coronary artery calcification as well as mitral annular calcification. Hepatobiliary: The liver appears intact without laceration or rupture. No perihepatic fluid is identified. No focal liver lesions are identified. The gallbladder is mildly distended without appreciable wall thickening. There is mild intrahepatic biliary duct dilatation as well as generalized common bile duct dilatation. The distal common bile duct measures 10 mm. No biliary duct mass or calculus is evident. Pancreas: There is no pancreatic inflammatory focus or traumatic lesion. There is dilatation of the pancreatic duct in the pancreatic head region to approximately 6 mm. No well-defined pancreatic mass seen. Adrenals/Urinary Tract: No adrenal lesions are identified. There are small cysts in the upper pole of each kidney. There is a cyst in the lower pole of the right kidney measuring 3.5 x 3.1 cm. There is a cyst in the mid left kidney measuring 1.3 x 0.9 cm. There is no contrast extravasation or perinephric stranding on either side. No renal or ureteral calculi. There is no appreciable wall thickening involving the urinary  bladder. There is complex fluid immediately adjacent to the anterior and leftward aspect of the urinary bladder. Stomach/Bowel: There is wall thickening in the distal stomach and proximal duodenum. No other bowel wall thickening is seen. No appreciable mesenteric thickening. No bowel obstruction. No free air or portal venous air. Vascular/Lymphatic: Aorta is diffusely tortuous but nonaneurysmal. There is extensive atherosclerotic calcification throughout the aorta and iliac arteries. There is also moderate calcification in the proximal mesenteric vessels bilaterally. No contrast extravasation is noted surrounding the major mesenteric vessels. No fluid collections in these areas. No adenopathy appreciable in the abdomen or pelvis. Reproductive: Uterus appears absent. No pelvic mass or free pelvic fluid. There is fluid in the anterior pelvis near the bladder as noted above. Other: Appendix appears normal. No abscess or ascites in the abdomen or pelvis. Musculoskeletal: Total hip replacement noted on the right. There is an old fracture of the mid right iliac crest. There is a fracture of the superior, lateral right sacral ala without appreciable surrounding hemorrhage. There is is an acute fracture of the right ischium medially as well as an acute fracture of the left medial superior pubic ramus. There is an apparent old fracture of the right superior pubic ramus. There is apparent hemorrhage anterior to the pubic symphysis as well as slightly superior to the fracture of the superior pubic ramus on the left. There is advanced lumbar arthropathy. There is no intramuscular or abdominal wall lesion. IMPRESSION: There are fractures in the pelvis with apparent hemorrhage in the anterior pelvis. Fluid is noted adjacent to the urinary bladder anteriorly and toward the left. This fluid is probably secondary to the pelvic fractures. There is no obvious disruption of the urinary bladder. If the patient has some suggesting  potential bladder injury, however, correlation with cystoscopy or cystography may be reasonable. There is prominence of the pancreatic duct in the pancreatic head region as well as biliary duct dilatation. A well-defined mass in the head of the pancreas is not  seen. These findings, however, are concerning for a potential small pancreatic mass. Given these findings, nonemergent pre and multiphasic post-contrast MR or CT of the pancreas would be advisable. The Wall thickening in the distal stomach and proximal duodenum. Probable gastritis and proximal duodenitis. No obvious bowel injury. Bronchiectatic change in the lung bases. Probable mucocele in the inferior aspect of the superior segment left lower lobe. Advise follow-up chest CT in 8-10 weeks to assess for stability of this finding. Extensive atherosclerotic calcification. Electronically Signed   By: Lowella Grip III M.D.   On: 11/04/2015 13:12   Ct L-spine No Charge  11/04/2015  CLINICAL DATA:  Fall with back pain. EXAM: CT LUMBAR SPINE WITHOUT CONTRAST TECHNIQUE: [place] in and reformatted images of the lumbar spine were obtained from abdominal CT data set. COMPARISON:  None. FINDINGS: Abdominal CT reported separately. Bronchiectatic changes at the left base are well seen on these coned in images. There is no evidence of lumbar spine fracture or traumatic malalignment. No gross canal hematoma. Lumbar exam there is a partly visualized sacral ala fracture on the right, not involving sacral foramina. There has been a remote right iliac wing fracture. Right obturator ring fractures reported separately. There is diffuse and advanced degenerative disc and facet disease with levoscoliosis. There is left-sided L4 and L5 foraminal compression due to scoliosis and spurring. Multilevel disc bulging and canal narrowing. IMPRESSION: 1. No evidence of acute lumbar spine injury. 2. Reference abdominal CT report concerning right sacral ala and obturator ring fractures.  Electronically Signed   By: Monte Fantasia M.D.   On: 11/04/2015 13:14   Dg Hips Bilat With Pelvis Min 5 Views  11/04/2015  CLINICAL DATA:  [place] this morning.  Bilateral hip pain. EXAM: DG HIP (WITH OR WITHOUT PELVIS) 5+V BILAT COMPARISON:  CT 07/05/2005 and 04/18/2015 FINDINGS: Right hip arthroplasty is located. There is no evidence for an acute periprosthetic fracture. Left hip is located without a fracture. However, there is new irregularity involving the pubic rami bilaterally. There appears to be fractures involving the left superior pubic ramus and a mildly displaced fracture involving the right inferior pubic ramus. In addition, there may be a new fracture involving the right side of the sacrum. Old deformity of the right superior pubic ramus. Again noted is sclerosis from old fracture in the right ilium. There is also an old fracture involving the right femur greater trochanter. Scoliosis and degenerative changes in the lower lumbar spine. IMPRESSION: Bilateral pubic rami fractures. Evidence for acute fractures involving the left superior pubic ramus and right inferior pubic ramus. Question a fracture of the right sacrum. Both hips are located. Old fractures involving the right ilium and right proximal femur. Electronically Signed   By: Markus Daft M.D.   On: 11/04/2015 10:39    ROS patient recently received morphine and is sleeping I was unable to accomplish a review of systems. Blood pressure 169/80, pulse 97, temperature 97.9 F (36.6 C), temperature source Oral, resp. rate 15, SpO2 93 %. Physical Exam Minimally tender to palpation along the initial tuberosity and pubic ramus. No deformity of the hips and knees or ankles the limb is not shortened. I did review the CT scan images in detail. Assessment/Plan:  Assessment: 82 year old female status post fall at home with nontender minimally displaced fractures of the left superior pubic ramus, right inferior pubic ramus and right sacrum. By  history she has had multiple falls at home and is starting to forget to take her meds on  time.  Plan: Admit to medicine for pain control, weightbearing as tolerated using a walker with physical therapy, consideration for long-term skilled nursing may be appropriate at this time and should be discussed with social work. No surgery will be required for these injuries. Expected healing time will be a few months.  Frederik Pear J 11/04/2015, 5:07 PM

## 2015-11-05 DIAGNOSIS — R935 Abnormal findings on diagnostic imaging of other abdominal regions, including retroperitoneum: Secondary | ICD-10-CM | POA: Diagnosis not present

## 2015-11-05 DIAGNOSIS — K838 Other specified diseases of biliary tract: Secondary | ICD-10-CM | POA: Diagnosis not present

## 2015-11-05 DIAGNOSIS — S32810A Multiple fractures of pelvis with stable disruption of pelvic ring, initial encounter for closed fracture: Secondary | ICD-10-CM | POA: Diagnosis not present

## 2015-11-05 DIAGNOSIS — I10 Essential (primary) hypertension: Secondary | ICD-10-CM | POA: Diagnosis not present

## 2015-11-05 DIAGNOSIS — K8689 Other specified diseases of pancreas: Secondary | ICD-10-CM | POA: Diagnosis not present

## 2015-11-05 DIAGNOSIS — W19XXXD Unspecified fall, subsequent encounter: Secondary | ICD-10-CM | POA: Diagnosis not present

## 2015-11-05 LAB — CBC WITH DIFFERENTIAL/PLATELET
Basophils Absolute: 0 10*3/uL (ref 0.0–0.1)
Basophils Relative: 0 %
Eosinophils Absolute: 0 10*3/uL (ref 0.0–0.7)
Eosinophils Relative: 1 %
HCT: 35 % — ABNORMAL LOW (ref 36.0–46.0)
Hemoglobin: 11.5 g/dL — ABNORMAL LOW (ref 12.0–15.0)
LYMPHS ABS: 1.6 10*3/uL (ref 0.7–4.0)
LYMPHS PCT: 20 %
MCH: 30.8 pg (ref 26.0–34.0)
MCHC: 32.9 g/dL (ref 30.0–36.0)
MCV: 93.8 fL (ref 78.0–100.0)
MONO ABS: 0.6 10*3/uL (ref 0.1–1.0)
MONOS PCT: 8 %
NEUTROS ABS: 5.7 10*3/uL (ref 1.7–7.7)
Neutrophils Relative %: 71 %
Platelets: 213 10*3/uL (ref 150–400)
RBC: 3.73 MIL/uL — ABNORMAL LOW (ref 3.87–5.11)
RDW: 13.7 % (ref 11.5–15.5)
WBC: 8 10*3/uL (ref 4.0–10.5)

## 2015-11-05 LAB — COMPREHENSIVE METABOLIC PANEL
ALBUMIN: 3.4 g/dL — AB (ref 3.5–5.0)
ALK PHOS: 59 U/L (ref 38–126)
ALT: 19 U/L (ref 14–54)
ANION GAP: 12 (ref 5–15)
AST: 30 U/L (ref 15–41)
BILIRUBIN TOTAL: 0.7 mg/dL (ref 0.3–1.2)
BUN: 16 mg/dL (ref 6–20)
CALCIUM: 9.3 mg/dL (ref 8.9–10.3)
CO2: 25 mmol/L (ref 22–32)
CREATININE: 0.69 mg/dL (ref 0.44–1.00)
Chloride: 103 mmol/L (ref 101–111)
GFR calc Af Amer: >60 mL/min (ref >60)
GFR calc non Af Amer: >60 mL/min (ref >60)
GLUCOSE: 108 mg/dL — AB (ref 65–99)
Potassium: 3.5 mmol/L (ref 3.5–5.1)
Sodium: 140 mmol/L (ref 135–145)
TOTAL PROTEIN: 5.9 g/dL — AB (ref 6.5–8.1)

## 2015-11-05 LAB — CANCER ANTIGEN 19-9: CA 19-9: 11 U/mL (ref 0–35)

## 2015-11-05 MED ORDER — LEVOTHYROXINE SODIUM 75 MCG PO TABS
150.0000 ug | ORAL_TABLET | Freq: Every day | ORAL | Status: DC
  Administered 2015-11-06 – 2015-11-08 (×3): 150 ug via ORAL
  Filled 2015-11-05 (×4): qty 2

## 2015-11-05 NOTE — Evaluation (Signed)
Physical Therapy Evaluation Patient Details Name: Katherine Flynn MRN: [id number] DOB: April 01, 1923 Today's Date: 11/05/2015   History of Present Illness  83 year old female with history of dementia, hypertension, hypothyroidism, iron deficiency anemia, with history of multiple falls in the past, presents with a fall, workup significant for multiple pelvic fractures with hemorrhage, patient admitted for pain control, seen by orthopedic, nonsurgical fracture, recommend pain control and PT  Clinical Impression  Pt with the above recent medical history presenting well below her baseline level. PTA pt was ambulating household distances at a Mod I level. Today pt requiring Mod-Max A for transfers and deferred further gait training due to pain and instability. Pt is having difficulties with safety awareness and displays a high fall risk. Recommending D/C to SNF before returning home to achieve Mod I level. Will continue to follow patient acutely for activity progression.    Follow Up Recommendations SNF;Supervision/Assistance - 24 hour    Equipment Recommendations  None recommended by PT    Recommendations for Other Services       Precautions / Restrictions Precautions Precautions: Fall Precaution Comments: hip fractures Restrictions Weight Bearing Restrictions: Yes RLE Weight Bearing: Weight bearing as tolerated LLE Weight Bearing: Weight bearing as tolerated      Mobility  Bed Mobility Overal bed mobility: Needs Assistance Bed Mobility: Supine to Sit     Supine to sit: Mod assist     General bed mobility comments: physical assist to bring LEs off of bed, cueing for hand placement on bed and use of railings, use of draw sheet to bring hips to EOB with minimal A from patient  Transfers Overall transfer level: Needs assistance Equipment used: Rolling walker (2 wheeled) Transfers: Sit to/from [employer] Sit to Stand: Mod assist Stand pivot transfers: Max  assist       General transfer comment: Pt verbalizing how to complete transfer however needing cues for hand palcement as pt tends to pull on RW or not transfer hands to RW at all, Cues for sequencing and foot and RW progression to pivot to [facility] and then to chair, pt making minimal clearnance of feet from foot, needing max A to position in fron tof chair before sitting  Ambulation/Gait             General Gait Details: deferred due to pain and minimal advancment on LEs during transfers  Stairs            Wheelchair Mobility    Modified Rankin (Stroke Patients Only)       Balance Overall balance assessment: Needs assistance Sitting-balance support: Feet supported;Bilateral upper extremity supported Sitting balance-Leahy Scale: Poor     Standing balance support: Bilateral upper extremity supported Standing balance-Leahy Scale: Poor Standing balance comment: frequent reminders for safe use of RW, needing assistance to stay upright                             Pertinent Vitals/Pain Pain Assessment: Faces Faces Pain Scale: Hurts whole lot Pain Location: back/legs Pain Descriptors / Indicators: Constant;Guarding;Grimacing Pain Intervention(s): Limited activity within patient's tolerance;Monitored during session    Home Living Family/patient expects to be discharged to:: Private residence Living Arrangements: Alone Available Help at Discharge: Family;Neighbor;Available PRN/intermittently Type of Home: House Home Access: Stairs to enter Entrance Stairs-Rails: Lawyer of Steps: 2 Home Layout: One level Home Equipment: Walker - 2 wheels      Prior Function Level of Independence: Independent with  assistive device(s)         Comments: Had neighbor who would check in occasionally     Hand Dominance   Dominant Hand: Left    Extremity/Trunk Assessment   Upper Extremity Assessment: Defer to OT evaluation            Lower Extremity Assessment: Generalized weakness;RLE deficits/detail;LLE deficits/detail      Cervical / Trunk Assessment: Kyphotic  Communication   Communication: No difficulties  Cognition Arousal/Alertness: Awake/alert Behavior During Therapy: WFL for tasks assessed/performed Overall Cognitive Status: History of cognitive impairments - at baseline                      General Comments General comments (skin integrity, edema, etc.): transferred to [facility], needing total A for hygiene    Exercises        Assessment/Plan    PT Assessment Patient needs continued PT services  PT Diagnosis Difficulty walking;Abnormality of gait;Acute pain;Generalized weakness   PT Problem List Decreased strength;Decreased range of motion;Decreased activity tolerance;Decreased balance;Decreased mobility;Decreased coordination;Decreased cognition;Decreased knowledge of use of DME;Decreased safety awareness;Pain  PT Treatment Interventions DME instruction;Gait training;Stair training;Functional mobility training;Therapeutic activities;Therapeutic exercise;Balance training;Neuromuscular re-education;Patient/family education   PT Goals (Current goals can be found in the Care Plan section) Acute Rehab PT Goals Patient Stated Goal: decrease pain PT Goal Formulation: With patient Time For Goal Achievement: 11/19/15 Potential to Achieve Goals: Fair    Frequency Min 3X/week   Barriers to discharge Decreased caregiver support;Inaccessible home environment      Co-evaluation               End of Session Equipment Utilized During Treatment: Gait belt Activity Tolerance: Patient limited by pain;Patient limited by fatigue Patient left: in chair;with call bell/phone within reach;with chair alarm set Nurse Communication: Mobility status    Functional Assessment Tool Used:  Clinical Observation Functional Limitation: Mobility: Walking and moving around Mobility: Walking and Moving Around  Current Status (Z6109): At least 40 percent but less than 60 percent impaired, limited or restricted Mobility: Walking and Moving Around Goal Status [phone]): At least 20 percent but less than 40 percent impaired, limited or restricted    Time: 1045-1107 PT Time Calculation (min) (ACUTE ONLY): 22 min   Charges:   PT Evaluation $PT Eval Moderate Complexity: 1 Procedure     PT G Codes:   PT G-Codes **NOT FOR INPATIENT CLASS** Functional Assessment Tool Used:  Clinical Observation Functional Limitation: Mobility: Walking and moving around Mobility: Walking and Moving Around Current Status (U9811): At least 40 percent but less than 60 percent impaired, limited or restricted Mobility: Walking and Moving Around Goal Status [phone]): At least 20 percent but less than 40 percent impaired, limited or restricted    Ulyses Jarred 11/05/2015, 12:56 PM  Ulyses Jarred, Student Physical Therapist Acute Rehab [phone]

## 2015-11-05 NOTE — Progress Notes (Signed)
PATIENT ID: Katherine Flynn  MRN: [id number]  DOB/AGE:  1923/05/21 / 82 y.o.         PROGRESS NOTE Subjective:   Patient is alert, oriented, no Nausea, no Vomiting, yes passing gas, no Bowel Movement. Taking PO well. Denies SOB, Chest or Calf Pain. Using Incentive Spirometer, PAS in place. Ambulate WBAT with walker and therapy assist, Patient reports pain as mild and moderate,     Objective: Vital signs in last 24 hours: Temp:  [98.3 F (36.8 C)-100.1 F (37.8 C)] 100.1 F (37.8 C) (02/24 0741) Pulse Rate:  [79-100] 88 (02/24 0741) Resp:  [12-21] 17 (02/24 0741) BP: (123-175)/(52-103) 137/63 mmHg (02/24 0741) SpO2:  [91 %-98 %] 92 % (02/24 0741) Weight:  [46.4 kg (102 lb 4.7 oz)] 46.4 kg (102 lb 4.7 oz) (02/23 2103)    Intake/Output from previous day: I/O last 3 completed shifts: In: 300.8 [I.V.:300.8] Out: 225 [Urine:225]   Intake/Output this shift:     LABORATORY DATA:  Recent Labs  11/04/15 1019 11/04/15 1610 11/05/15 0331 11/05/15 0335  WBC 8.7 8.4  --  8.0  HGB 12.3 12.1  --  11.5*  HCT 37.4 37.8  --  35.0*  PLT 253 253  --  213  NA 140  --  140  --   K 3.7  --  3.5  --   CL 105  --  103  --   CO2 23  --  25  --   BUN 14  --  16  --   CREATININE 0.70  --  0.69  --   GLUCOSE 196*  --  108*  --   CALCIUM 9.5 9.4 9.3  --     Examination: Neurologically intact Neurovascular intact Sensation intact distally Intact pulses distally Dorsiflexion/Plantar flexion intact} Pt able to wiggle toes without difficulty.  She does have obvious discomfort with trying to change position in bed. Assessment:   82 year old female status post fall at home with nontender minimally displaced fractures of the left superior pubic ramus, right inferior pubic ramus and right sacrum.  ADDITIONAL DIAGNOSIS:  Hypertension and hypothyroidism, COPD  Plan:  Weight Bearing as Tolerated (WBAT)  DISCHARGE PLAN: We recommend SNF placement and again this should be discussed with  social work.  DISCHARGE NEEDS: Katherine Flynn, Katherine Flynn 11/05/2015, 9:58 AM

## 2015-11-05 NOTE — Progress Notes (Signed)
OT NOTE   OT order received and appreciated however this conflicts with current bedrest order set. Please increase activity tolerance as appropriate and remove bedrest from orders. . Please contact OT at [phone] if bed rest order is discontinued. OT will hold evaluation at this time and will check back as time allows pending increased activity orders.    Kaleea Penner, Brynn   OTR/L Pager: [phone] Office: [phone] .  

## 2015-11-05 NOTE — Progress Notes (Addendum)
[facility] PROGRESS NOTE  Katherine CALVARIO ZOX:[id number] DOB: 1923/07/06 DOA: 11/04/2015 PCP: Rogelia Boga, MD  Assessment/Plan:  Multiple pelvic fractures/ Fall at home - left superior pubic ramus, right inferior pubic ramus fracture, nondisplaced right sacral fracture -s/p mechanical fall -Orthopedic consult appreciated, recommended weightbearing as tolerated using a walker with physical therapy, consideration for skilled nursing,  Expected healing time will be a few months -Some Bleeding noted on CT scan secondary to pelvic fractures-EDP Dr. Adela Lank discussed with Dr. Lindie Spruce of trauma surgery who felt this was not significant bleeding  -Pain control--IV morphine for severe pain and oxy IR for moderate pain -monitor Hb   Hypothyroidism -TSH very high, i doubled synthroid dose   Essential hypertension -Moderately controlled -Continue preadmission medications   Prolonged Q-T interval on ECG/RBBB -Follow on telemetry -Avoid offending medication   Iron deficiency anemia -Current hemoglobin stable and at baseline -See above regarding serial CBC   Protein calorie malnutrition  -Nutrition consultation   Dilated bile duct/Pancreatic duct dilated  -No documented history of prior cholecystectomy -Radiologist questions possible nonvisible pancreatic mass and recommends nonemergent MR or CT (pre and multiphasic postcontrast) of pancreas -lipase and CA 19.9 normal   Abnormal findings CT abdomen/? Left pulmonary mucocele -Patient recently treated as an outpatient for nonbacterial bronchitis -Follow clinically DVT proph: SCDs  Code Status: DNR Family communication Dispo: SNF if possible  Consultants:  Ortho  D/w Trauma Surgery  HPI/Subjective: Feels ok, pain with movt at hip  Objective: Filed Vitals:   11/05/15 0500 11/05/15 0741  BP: 123/52 137/63  Pulse: 88 88  Temp: 98.4 F (36.9 C) 100.1 F (37.8 C)  Resp: 18 17    Intake/Output  Summary (Last 24 hours) at 11/05/15 1304 Last data filed at 11/05/15 1049  Gross per 24 hour  Intake 540.83 ml  Output    225 ml  Net 315.83 ml   Filed Weights   11/04/15 2103  Weight: 46.4 kg (102 lb 4.7 oz)    Exam:   General: AAOx3, frail elderly  Cardiovascular: S1S2/RRR  Respiratory: CTAB  Abdomen: soft, NT, BS present  Musculoskeletal: no edema c/c   Data Reviewed: Basic Metabolic Panel:  Recent Labs Lab 11/04/15 1019 11/04/15 1610 11/05/15 0331  NA 140  --  140  K 3.7  --  3.5  CL 105  --  103  CO2 23  --  25  GLUCOSE 196*  --  108*  BUN 14  --  16  CREATININE 0.70  --  0.69  CALCIUM 9.5 9.4 9.3  MG  --  1.8  --   PHOS  --  3.7  --    Liver Function Tests:  Recent Labs Lab 11/05/15 0331  AST 30  ALT 19  ALKPHOS 59  BILITOT 0.7  PROT 5.9*  ALBUMIN 3.4*    Recent Labs Lab 11/04/15 1610  LIPASE 72*   No results for input(s): AMMONIA in the last 168 hours. CBC:  Recent Labs Lab 11/04/15 1019 11/04/15 1610 11/05/15 0335  WBC 8.7 8.4 8.0  NEUTROABS 7.4 7.4 5.7  HGB 12.3 12.1 11.5*  HCT 37.4 37.8 35.0*  MCV 91.4 91.5 93.8  PLT 253 253 213   Cardiac Enzymes: No results for input(s): CKTOTAL, CKMB, CKMBINDEX, TROPONINI in the last 168 hours. BNP (last 3 results) No results for input(s): BNP in the last 8760 hours.  ProBNP (last 3 results) No results for input(s): PROBNP in the last 8760 hours.  CBG: No results for input(s):  GLUCAP in the last 168 hours.  No results found for this or any previous visit (from the past 240 hour(s)).   Studies: Ct Head Wo Contrast  11/04/2015  CLINICAL DATA:  Patient fell last night injuring head and both hips. EXAM: CT HEAD WITHOUT CONTRAST TECHNIQUE: Contiguous axial images were obtained from the base of the skull through the vertex without intravenous contrast. COMPARISON:  CT head 02/10/2015. FINDINGS: Brain: There is no evidence of acute intracranial hemorrhage, mass lesion, brain edema or  extra-axial fluid collection. The ventricles and subarachnoid spaces are diffusely prominent but stable. There is no CT evidence of acute cortical infarction. There is stable confluent low-density within the periventricular white matter. Intracranial vascular calcifications are noted. Bones/sinuses/visualized face: The visualized paranasal sinuses, mastoid air cells and middle ears are clear. The calvarium is intact. The right parietal scalp hematoma demonstrated previously has resolved. IMPRESSION: No acute intracranial findings. Stable chronic atrophy and periventricular white matter disease, likely related to chronic small vessel ischemic changes. Electronically Signed   By: Carey Bullocks M.D.   On: 11/04/2015 12:21   Ct Abdomen Pelvis W Contrast  11/04/2015  CLINICAL DATA:  Pain following fall 1 day prior EXAM: CT ABDOMEN AND PELVIS WITH CONTRAST TECHNIQUE: Multidetector CT imaging of the abdomen and pelvis was performed using the standard protocol following bolus administration of intravenous contrast. CONTRAST:  80mL OMNIPAQUE IOHEXOL 300 MG/ML  SOLN COMPARISON:  Pelvis and hip radiographs November 04, 2015; CT abdomen and pelvis July 06, 2015 FINDINGS: Lower chest: There is scarring in each lung base region. There is a probable mucocele in the superior segment left lower lobe measuring 1.4 x 0.9 cm. There is no basilar pneumothorax. There is atelectatic change in the medial segment of the right middle lobe adjacent to the heart border. There is lower lobe bronchiectatic change bilaterally. There is extensive coronary artery calcification as well as mitral annular calcification. Hepatobiliary: The liver appears intact without laceration or rupture. No perihepatic fluid is identified. No focal liver lesions are identified. The gallbladder is mildly distended without appreciable wall thickening. There is mild intrahepatic biliary duct dilatation as well as generalized common bile duct dilatation. The  distal common bile duct measures 10 mm. No biliary duct mass or calculus is evident. Pancreas: There is no pancreatic inflammatory focus or traumatic lesion. There is dilatation of the pancreatic duct in the pancreatic head region to approximately 6 mm. No well-defined pancreatic mass seen. Adrenals/Urinary Tract: No adrenal lesions are identified. There are small cysts in the upper pole of each kidney. There is a cyst in the lower pole of the right kidney measuring 3.5 x 3.1 cm. There is a cyst in the mid left kidney measuring 1.3 x 0.9 cm. There is no contrast extravasation or perinephric stranding on either side. No renal or ureteral calculi. There is no appreciable wall thickening involving the urinary bladder. There is complex fluid immediately adjacent to the anterior and leftward aspect of the urinary bladder. Stomach/Bowel: There is wall thickening in the distal stomach and proximal duodenum. No other bowel wall thickening is seen. No appreciable mesenteric thickening. No bowel obstruction. No free air or portal venous air. Vascular/Lymphatic: Aorta is diffusely tortuous but nonaneurysmal. There is extensive atherosclerotic calcification throughout the aorta and iliac arteries. There is also moderate calcification in the proximal mesenteric vessels bilaterally. No contrast extravasation is noted surrounding the major mesenteric vessels. No fluid collections in these areas. No adenopathy appreciable in the abdomen or pelvis. Reproductive: Uterus appears  absent. No pelvic mass or free pelvic fluid. There is fluid in the anterior pelvis near the bladder as noted above. Other: Appendix appears normal. No abscess or ascites in the abdomen or pelvis. Musculoskeletal: Total hip replacement noted on the right. There is an old fracture of the mid right iliac crest. There is a fracture of the superior, lateral right sacral ala without appreciable surrounding hemorrhage. There is is an acute fracture of the right  ischium medially as well as an acute fracture of the left medial superior pubic ramus. There is an apparent old fracture of the right superior pubic ramus. There is apparent hemorrhage anterior to the pubic symphysis as well as slightly superior to the fracture of the superior pubic ramus on the left. There is advanced lumbar arthropathy. There is no intramuscular or abdominal wall lesion. IMPRESSION: There are fractures in the pelvis with apparent hemorrhage in the anterior pelvis. Fluid is noted adjacent to the urinary bladder anteriorly and toward the left. This fluid is probably secondary to the pelvic fractures. There is no obvious disruption of the urinary bladder. If the patient has some suggesting potential bladder injury, however, correlation with cystoscopy or cystography may be reasonable. There is prominence of the pancreatic duct in the pancreatic head region as well as biliary duct dilatation. A well-defined mass in the head of the pancreas is not seen. These findings, however, are concerning for a potential small pancreatic mass. Given these findings, nonemergent pre and multiphasic post-contrast MR or CT of the pancreas would be advisable. The Wall thickening in the distal stomach and proximal duodenum. Probable gastritis and proximal duodenitis. No obvious bowel injury. Bronchiectatic change in the lung bases. Probable mucocele in the inferior aspect of the superior segment left lower lobe. Advise follow-up chest CT in 8-10 weeks to assess for stability of this finding. Extensive atherosclerotic calcification. Electronically Signed   By: Bretta Bang III M.D.   On: 11/04/2015 13:12   Ct L-spine No Charge  11/04/2015  CLINICAL DATA:  Fall with back pain. EXAM: CT LUMBAR SPINE WITHOUT CONTRAST TECHNIQUE: Coned in and reformatted images of the lumbar spine were obtained from abdominal CT data set. COMPARISON:  None. FINDINGS: Abdominal CT reported separately. Bronchiectatic changes at the left  base are well seen on these coned in images. There is no evidence of lumbar spine fracture or traumatic malalignment. No gross canal hematoma. Lumbar exam there is a partly visualized sacral ala fracture on the right, not involving sacral foramina. There has been a remote right iliac wing fracture. Right obturator ring fractures reported separately. There is diffuse and advanced degenerative disc and facet disease with levoscoliosis. There is left-sided L4 and L5 foraminal compression due to scoliosis and spurring. Multilevel disc bulging and canal narrowing. IMPRESSION: 1. No evidence of acute lumbar spine injury. 2. Reference abdominal CT report concerning right sacral ala and obturator ring fractures. Electronically Signed   By: Marnee Spring M.D.   On: 11/04/2015 13:14   Dg Hips Bilat With Pelvis Min 5 Views  11/04/2015  CLINICAL DATA:  Larey Seat this morning.  Bilateral hip pain. EXAM: DG HIP (WITH OR WITHOUT PELVIS) 5+V BILAT COMPARISON:  CT 07/05/2005 and 04/18/2015 FINDINGS: Right hip arthroplasty is located. There is no evidence for an acute periprosthetic fracture. Left hip is located without a fracture. However, there is new irregularity involving the pubic rami bilaterally. There appears to be fractures involving the left superior pubic ramus and a mildly displaced fracture involving the right inferior  pubic ramus. In addition, there may be a new fracture involving the right side of the sacrum. Old deformity of the right superior pubic ramus. Again noted is sclerosis from old fracture in the right ilium. There is also an old fracture involving the right femur greater trochanter. Scoliosis and degenerative changes in the lower lumbar spine. IMPRESSION: Bilateral pubic rami fractures. Evidence for acute fractures involving the left superior pubic ramus and right inferior pubic ramus. Question a fracture of the right sacrum. Both hips are located. Old fractures involving the right ilium and right proximal  femur. Electronically Signed   By: Richarda Overlie M.D.   On: 11/04/2015 10:39    Scheduled Meds: . docusate sodium  100 mg Oral BID  . feeding supplement (ENSURE ENLIVE)  237 mL Oral BID BM  . [START ON 11/06/2015] levothyroxine  150 mcg Oral QAC breakfast  . lisinopril  20 mg Oral Daily  . multivitamin with minerals  1 tablet Oral Daily  . sodium chloride flush  3 mL Intravenous Q12H   Continuous Infusions: . sodium chloride 50 mL/hr at 11/05/15 0026   Antibiotics Given (last 72 hours)    None      Principal Problem:   Multiple pelvic fractures (HCC) Active Problems:   Hypothyroidism   Essential hypertension   Iron deficiency anemia   Protein calorie malnutrition (HCC)   Fall at home   Prolonged Q-T interval on ECG   RBBB   Dilated bile duct   Pancreatic duct dilated (HCC)   Abnormal CT of the abdomen    Time spent:    [facility]  [facility] Pager [phone]. If 7PM-7AM, please contact night-coverage at [email or web address], password [facility] 11/05/2015, 1:04 PM  LOS: 1 day

## 2015-11-05 NOTE — Progress Notes (Signed)
Initial Nutrition Assessment  DOCUMENTATION CODES:   Severe malnutrition in context of chronic illness  INTERVENTION:   -Continue Ensure Enlive po BID, each supplement provides 350 kcal and 20 grams of protein  NUTRITION DIAGNOSIS:   Malnutrition related to chronic illness as evidenced by severe depletion of muscle mass, severe depletion of body fat.  GOAL:   Patient will meet greater than or equal to 90% of their needs  MONITOR:   PO intake, Supplement acceptance, Labs, Weight trends, Skin, I & O's  REASON FOR ASSESSMENT:   Malnutrition Screening Tool, Consult Assessment of nutrition requirement/status  ASSESSMENT:   82 year old female with history of dementia, hypertension, hypothyroidism, iron deficiency anemia, with history of multiple falls in the past, presents with a fall, workup significant for multiple pelvic fractures with hemorrhage, patient admitted for pain control, seen by orthopedic, nonsurgical fracture, recommend pain control and PT, monitor hemoglobin closely.  Pt admitted with multiple pelvic fractures.   Reviewed orthopedics note from 11/04/12; no plans for surgery. Per MD notes, CT of abdomen and pelvis revealed prominence of pancreatic duct and pancreatic head region.   Spoke with pt, who was sitting in recliner at time of visit. Pt reveals that she is "tired" from working with physical therapy just prior to RD visit. Pt reports her appetite is fair to poor at baseline. She reports "I eat when I'm hungry". Typical meal pattern is several small meals per day; she prepares simple foods, such as tomato sandwiches. She reports she consumed about 50% of her eggs and sausage this morning.  Pt reports that she has always been petite. She reports UBW around 98#.   Nutrition-Focused physical exam completed. Findings are severe fat depletion, severe muscle depletion, and no edema.   Pt consumed about 75% of her Ensure supplement, which she reports she enjoys.  Discussed importance of good meal and supplement intake to promote healing.   Labs reviewed.   Diet Order:  Diet regular Room service appropriate?: Yes; Fluid consistency:: Thin  Skin:  Reviewed, no issues  Last BM:  11/04/15  Height:   Ht Readings from Last 1 Encounters:  11/04/15  (1.499 m)    Weight:   Wt Readings from Last 1 Encounters:  11/04/15 102 lb 4.7 oz (46.4 kg)    Ideal Body Weight:  44.5 kg  BMI:  Body mass index is 20.65 kg/(m^2).  Estimated Nutritional Needs:   Kcal:  1200-1400  Protein:  60-75 grams  Fluid:  1.2-1.4 L  EDUCATION NEEDS:   Education needs addressed  Evangaline Jou A. Mayford Knife, RD, LDN, CDE Pager: [phone] After hours Pager: [phone]

## 2015-11-06 DIAGNOSIS — K838 Other specified diseases of biliary tract: Secondary | ICD-10-CM | POA: Diagnosis not present

## 2015-11-06 DIAGNOSIS — K8689 Other specified diseases of pancreas: Secondary | ICD-10-CM | POA: Diagnosis not present

## 2015-11-06 DIAGNOSIS — I10 Essential (primary) hypertension: Secondary | ICD-10-CM | POA: Diagnosis not present

## 2015-11-06 DIAGNOSIS — R935 Abnormal findings on diagnostic imaging of other abdominal regions, including retroperitoneum: Secondary | ICD-10-CM | POA: Diagnosis not present

## 2015-11-06 DIAGNOSIS — S32810A Multiple fractures of pelvis with stable disruption of pelvic ring, initial encounter for closed fracture: Principal | ICD-10-CM

## 2015-11-06 DIAGNOSIS — W19XXXD Unspecified fall, subsequent encounter: Secondary | ICD-10-CM | POA: Diagnosis not present

## 2015-11-06 LAB — BASIC METABOLIC PANEL
ANION GAP: 8 (ref 5–15)
BUN: 17 mg/dL (ref 6–20)
CO2: 24 mmol/L (ref 22–32)
Calcium: 8.3 mg/dL — ABNORMAL LOW (ref 8.9–10.3)
Chloride: 106 mmol/L (ref 101–111)
Creatinine, Ser: 0.65 mg/dL (ref 0.44–1.00)
GFR calc Af Amer: >60 mL/min (ref >60)
GFR calc non Af Amer: >60 mL/min (ref >60)
Glucose, Bld: 99 mg/dL (ref 65–99)
POTASSIUM: 3.5 mmol/L (ref 3.5–5.1)
SODIUM: 138 mmol/L (ref 135–145)

## 2015-11-06 LAB — CBC
HEMATOCRIT: 33.2 % — AB (ref 36.0–46.0)
Hemoglobin: 10.8 g/dL — ABNORMAL LOW (ref 12.0–15.0)
MCH: 30.4 pg (ref 26.0–34.0)
MCHC: 32.5 g/dL (ref 30.0–36.0)
MCV: 93.5 fL (ref 78.0–100.0)
Platelets: 208 10*3/uL (ref 150–400)
RBC: 3.55 MIL/uL — ABNORMAL LOW (ref 3.87–5.11)
RDW: 13.6 % (ref 11.5–15.5)
WBC: 6.8 10*3/uL (ref 4.0–10.5)

## 2015-11-06 MED ORDER — SENNOSIDES-DOCUSATE SODIUM 8.6-50 MG PO TABS: 1.0000 | ORAL_TABLET | Freq: Two times a day (BID) | ORAL | Status: DC

## 2015-11-06 MED ORDER — SENNOSIDES-DOCUSATE SODIUM 8.6-50 MG PO TABS: 1.0000 | ORAL_TABLET | Freq: Every evening | ORAL | Status: DC | PRN

## 2015-11-06 NOTE — Clinical Social Work Placement (Signed)
   CLINICAL SOCIAL WORK PLACEMENT  NOTE  Date:  11/06/2015  Patient Details  Name: Katherine Flynn MRN: [id number] Date of Birth: Dec 01, 1922  Clinical Social Work is seeking post-discharge placement for this patient at the   level of care (*CSW will initial, date and re-position this form in  chart as items are completed):  Yes   Patient/family provided with [facility] Social Work Department's list of facilities offering this level of care within the geographic area requested by the patient (or if unable, by the patient's family).  Yes   Patient/family informed of their freedom to choose among providers that offer the needed level of care, that participate in Medicare, Medicaid or managed care program needed by the patient, have an available bed and are willing to accept the patient.  Yes   Patient/family informed of [facility]'s ownership interest in [facility] and [facility], as well as of the fact that they are under no obligation to receive care at these facilities.  PASRR submitted to EDS on       PASRR number received on       Existing PASRR number confirmed on     CURRENTLY UNABLE TO CONFIRM PASARR!  FL2 transmitted to all facilities in geographic area requested by pt/family on 11/06/15     FL2 transmitted to all facilities within larger geographic area on       Patient informed that his/her managed care company has contracts with or will negotiate with certain facilities, including the following:            Patient/family informed of bed offers received.  Patient chooses bed at       Physician recommends and patient chooses bed at      Patient to be transferred to   on  .  Patient to be transferred to facility by       Patient family notified on   of transfer.  Name of family member notified:        PHYSICIAN Please sign FL2     Additional Comment:    _______________________________________________ Vaughan Browner, LCSW 11/06/2015,  4:02 PM

## 2015-11-06 NOTE — Clinical Social Work Note (Signed)
Clinical Social Work Assessment  Patient Details  Name: Katherine Flynn MRN: [id number] Date of Birth: [birth year]  Date of referral:  11/06/15               Reason for consult:  Facility Placement, Discharge Planning                Permission sought to share information with:  Family Supports, Magazine features editor, Case Estate manager/land agent granted to share information::  Yes, Verbal Permission Granted  Name::      Acupuncturist)  Agency::   (SNF's )  Relationship::   (Son)  Contact Information:   [phone])  Housing/Transportation Living arrangements for the past 2 months:  Single Family Home Source of Information:  Adult Children Patient Interpreter Needed:  None Criminal Activity/Legal Involvement Pertinent to Current Situation/Hospitalization:  No - Comment as needed Significant Relationships:  Adult Children Lives with:  Self Do you feel safe going back to the place where you live?  No Need for family participation in patient care:  No (Coment)  Care giving concerns:  Patient's son, Katherine Flynn reported that patient is from home alone without 24 hr supervision/assistance.    Social Worker assessment / plan:  Visual merchandiser spoke with pt's son, Katherine Flynn via telephone in reference to post-acute placement for SNF. CSW introduced CSW role and SNF process. Pt's son reported the patient lives alone and family is agreeable to SNF placement. Pt's son further reported that he is familiar with SNF process as patient has been to [facility] in the past (Katherine 2016). Pt's son prefers pt return to SNF, [facility] however open to considering other options. No further concerns reported at this time. CSW will continue to follow pt and pt's family for continued support and to facilitate pt's discharge needs once medically stable for d/c.  Employment status:  Retired Database administrator PT Recommendations:  Skilled Nursing  Facility Information / Referral to community resources:  Skilled Nursing Facility  Patient/Family's Response to care:  Pt confused/disoriented during assessment. Pt's son and family agreeable to SNF placement. Family involved in care and appreciated social work intervention.  Patient/Family's Understanding of and Emotional Response to Diagnosis, Current Treatment, and Prognosis:  Family understanding of d/c planning and medical interventions.   Emotional Assessment Appearance:  Appears stated age Attitude/Demeanor/Rapport:  Unable to Assess Affect (typically observed):  Unable to Assess Orientation:  Oriented to Self Alcohol / Substance use:  Not Applicable Psych involvement (Current and /or in the community):  No (Comment)  Discharge Needs  Concerns to be addressed:  Care Coordination Readmission within the last 30 days:  No Current discharge risk:  Dependent with Mobility Barriers to Discharge:  Continued Medical Work up, [employer], MSW, LCSWA [phone] 11/06/2015 4:02 PM

## 2015-11-06 NOTE — [place] FL2 (Signed)
[place] MEDICAID FL2 LEVEL OF CARE SCREENING TOOL     IDENTIFICATION  Patient Name: Katherine Flynn Birthdate: December 07, 1922 Sex: female Admission Date (Current Location): 11/04/2015  [facility] and [place] Number:  Producer, television/film/video and Address:  The . [facility], [address], [place], [place] [address]      Provider Number: [id number]  Attending Physician Name and Address:  Zannie Cove, MD  Relative Name and Phone Number:       Current Level of Care: Hospital Recommended Level of Care: Skilled Nursing Facility Prior Approval Number:    Date Approved/Denied:   PASRR Number:    Discharge Plan: SNF    Current Diagnoses: Patient Active Problem List   Diagnosis Date Noted  . Multiple pelvic fractures (HCC) 11/04/2015  . Fall at home 11/04/2015  . Prolonged Q-T interval on ECG 11/04/2015  . RBBB 11/04/2015  . Dilated bile duct 11/04/2015  . Pancreatic duct dilated (HCC) 11/04/2015  . Abnormal CT of the abdomen 11/04/2015  . Fall 04/19/2015  . Protein-calorie malnutrition, severe (HCC) 04/19/2015  . Closed fracture of right iliac wing (HCC) 04/18/2015  . Peri-prosthetic fracture around prosthetic hip (HCC) 04/18/2015  . Protein calorie malnutrition (HCC) 04/18/2015  . Hypokalemia 11/24/2014  . Iron deficiency anemia 12/30/2012  . COPD 11/19/2007  . UTI'S, RECURRENT 11/19/2007  . HEEL SPUR 11/19/2007  . Hypothyroidism 11/04/2007  . Essential hypertension 11/04/2007  . GERD 11/04/2007  . Osteoarthritis 11/04/2007  . WEIGHT LOSS 11/04/2007  . PYLORIC STENOSIS 07/18/2007  . OTHER OBSTRUCTION OF DUODENUM 08/10/2006    Orientation RESPIRATION BLADDER Height & Weight     Place, Situation, Time, Self  Normal Continent Weight: 104 lb 4.8 oz (47.31 kg) Height:   (149.9 cm)  BEHAVIORAL SYMPTOMS/MOOD NEUROLOGICAL BOWEL NUTRITION STATUS   (NONE )  (NONE ) Continent Diet (Regular )  AMBULATORY STATUS COMMUNICATION OF NEEDS Skin    Limited Assist Verbally Skin abrasions (Head (posterior))                       Personal Care Assistance Level of Assistance  Bathing, Feeding, Dressing Bathing Assistance: Limited assistance Feeding assistance: Independent Dressing Assistance: Limited assistance     Functional Limitations Info  Sight, Hearing, Speech Sight Info: Adequate Hearing Info: Adequate Speech Info: Adequate    SPECIAL CARE FACTORS FREQUENCY  PT (By licensed PT), OT (By licensed OT)     PT Frequency: 3              Contractures      Additional Factors Info  Code Status, Allergies Code Status Info: DNR Allergies Info: N/A           Current Medications (11/06/2015):  This is the current hospital active medication list Current Facility-Administered Medications  Medication Dose Route Frequency Provider Last Rate Last Dose  . acetaminophen (TYLENOL) tablet 650 mg  650 mg Oral Q6H PRN Russella Dar, NP       Or  . acetaminophen (TYLENOL) suppository 650 mg  650 mg Rectal Q6H PRN Russella Dar, NP      . alum & mag hydroxide-simeth (MAALOX/MYLANTA) 200-200-20 MG/5ML suspension 30 mL  30 mL Oral Q6H PRN Russella Dar, NP      . bisacodyl (DULCOLAX) suppository 10 mg  10 mg Rectal Daily PRN Russella Dar, NP      . feeding supplement (ENSURE ENLIVE) (ENSURE ENLIVE) liquid 237 mL  237 mL Oral BID  BM Leana Roe Elgergawy, MD   237 mL at 11/06/15 1500  . levothyroxine (SYNTHROID, LEVOTHROID) tablet 150 mcg  150 mcg Oral QAC breakfast Zannie Cove, MD   150 mcg at 11/06/15 4098  . lisinopril (PRINIVIL,ZESTRIL) tablet 20 mg  20 mg Oral Daily Russella Dar, NP   20 mg at 11/06/15 1191  . magnesium hydroxide (MILK OF MAGNESIA) suspension 30 mL  30 mL Oral Daily PRN Russella Dar, NP      . morphine 2 MG/ML injection 1 mg  1 mg Intravenous Q2H PRN Russella Dar, NP   1 mg at 11/06/15 0809  . multivitamin with minerals tablet 1 tablet  1 tablet Oral Daily Russella Dar, NP   1 tablet at  11/06/15 [id number]  . ondansetron (ZOFRAN) injection 4 mg  4 mg Intravenous Q6H PRN Russella Dar, NP   4 mg at 11/04/15 1627  . oxyCODONE (Oxy IR/ROXICODONE) immediate release tablet 5 mg  5 mg Oral Q4H PRN Russella Dar, NP   5 mg at 11/06/15 1209  . promethazine (PHENERGAN) injection 6.25 mg  6.25 mg Intravenous Q6H PRN Russella Dar, NP   6.25 mg at 11/04/15 2132  . senna-docusate (Senokot-S) tablet 1 tablet  1 tablet Oral QHS PRN Zannie Cove, MD      . sodium chloride flush (NS) 0.9 % injection 3 mL  3 mL Intravenous Q12H Russella Dar, NP   3 mL at 11/06/15 0809  . sodium phosphate (FLEET) 7-19 GM/118ML enema 1 enema  1 enema Rectal Once PRN Russella Dar, NP         Discharge Medications: Please see discharge summary for a list of discharge medications.  Relevant Imaging Results:  Relevant Lab Results:   Additional Information [id number]  Derenda Fennel, MSW, LCSWA [phone] 11/06/2015 4:08 PM

## 2015-11-06 NOTE — Progress Notes (Signed)
[facility] PROGRESS NOTE  Katherine Flynn [id number]:[id number] DOB: 23-Jan-1923 DOA: 11/04/2015 PCP: Rogelia Boga, MD  Assessment/Plan:  Multiple pelvic fractures/ Fall at home - left superior pubic ramus, right inferior pubic ramus fracture, nondisplaced right sacral fracture -s/p mechanical fall -Orthopedic consult appreciated, recommended weightbearing as tolerated using a walker with physical therapy, consideration for skilled nursing,  Expected healing time will be a few months -Some Bleeding noted on CT scan secondary to pelvic fractures-EDP Dr. Adela Lank discussed with Dr. Lindie Spruce of trauma surgery who felt this was not significant bleeding  -Pain control--IV morphine for severe pain and oxy IR for moderate pain -CSW consult for SNF for rehab   Hypothyroidism -TSH very high, hence I doubled synthroid dose   Essential hypertension -stable, continue home meds   Prolonged Q-T interval on ECG/RBBB -Follow on telemetry -Avoid offending medication   Iron deficiency anemia -Hb down a little from dilution -cbc in am   Protein calorie malnutrition  -Nutrition consultation   Dilated bile duct/Pancreatic duct dilated  -No documented history of prior cholecystectomy -Radiologist questions possible nonvisible pancreatic mass and recommends nonemergent MR or CT (pre and multiphasic postcontrast) of pancreas -lipase and CA 19.9 normal   Abnormal findings CT abdomen/? Left pulmonary mucocele -Patient recently treated as an outpatient for nonbacterial bronchitis -Follow clinically  DVT proph: SCDs  Code Status: DNR Family communication Dispo: SNF if possible  Consultants:  Ortho  D/w Trauma Surgery  HPI/Subjective: Feels ok, pain with movt at hip  Objective: Filed Vitals:   11/06/15 0547 11/06/15 0943  BP: 147/74 168/62  Pulse: 80 89  Temp: 99.1 F (37.3 C) 99.9 F (37.7 C)  Resp: 14 16    Intake/Output Summary (Last 24 hours) at 11/06/15  1302 Last data filed at 11/06/15 0945  Gross per 24 hour  Intake 2200.42 ml  Output    600 ml  Net 1600.42 ml   Filed Weights   11/04/15 2103 11/05/15 2155  Weight: 46.4 kg (102 lb 4.7 oz) 47.31 kg (104 lb 4.8 oz)    Exam:   General: AAOx3, frail elderly  Cardiovascular: S1S2/RRR  Respiratory: CTAB  Abdomen: soft, NT, BS present  Musculoskeletal: no edema c/c   Data Reviewed: Basic Metabolic Panel:  Recent Labs Lab 11/04/15 1019 11/04/15 1610 11/05/15 0331 11/06/15 0558  NA 140  --  140 138  K 3.7  --  3.5 3.5  CL 105  --  103 106  CO2 23  --  25 24  GLUCOSE 196*  --  108* 99  BUN 14  --  16 17  CREATININE 0.70  --  0.69 0.65  CALCIUM 9.5 9.4 9.3 8.3*  MG  --  1.8  --   --   PHOS  --  3.7  --   --    Liver Function Tests:  Recent Labs Lab 11/05/15 0331  AST 30  ALT 19  ALKPHOS 59  BILITOT 0.7  PROT 5.9*  ALBUMIN 3.4*    Recent Labs Lab 11/04/15 1610  LIPASE 72*   No results for input(s): AMMONIA in the last 168 hours. CBC:  Recent Labs Lab 11/04/15 1019 11/04/15 1610 11/05/15 0335 11/06/15 0558  WBC 8.7 8.4 8.0 6.8  NEUTROABS 7.4 7.4 5.7  --   HGB 12.3 12.1 11.5* 10.8*  HCT 37.4 37.8 35.0* 33.2*  MCV 91.4 91.5 93.8 93.5  PLT 253 253 213 208   Cardiac Enzymes: No results for input(s): CKTOTAL, CKMB, CKMBINDEX, TROPONINI in the last  168 hours. BNP (last 3 results) No results for input(s): BNP in the last 8760 hours.  ProBNP (last 3 results) No results for input(s): PROBNP in the last 8760 hours.  CBG: No results for input(s): GLUCAP in the last 168 hours.  No results found for this or any previous visit (from the past 240 hour(s)).   Studies: No results found.  Scheduled Meds: . feeding supplement (ENSURE ENLIVE)  237 mL Oral BID BM  . levothyroxine  150 mcg Oral QAC breakfast  . lisinopril  20 mg Oral Daily  . multivitamin with minerals  1 tablet Oral Daily  . sodium chloride flush  3 mL Intravenous Q12H   Continuous  Infusions:   Antibiotics Given (last 72 hours)    None      Principal Problem:   Multiple pelvic fractures (HCC) Active Problems:   Hypothyroidism   Essential hypertension   Iron deficiency anemia   Protein calorie malnutrition (HCC)   Fall at home   Prolonged Q-T interval on ECG   RBBB   Dilated bile duct   Pancreatic duct dilated (HCC)   Abnormal CT of the abdomen    Time spent:    [facility]  [facility] Pager [phone]. If 7PM-7AM, please contact night-coverage at [email or web address], password [facility] 11/06/2015, 1:02 PM  LOS: 2 days

## 2015-11-07 DIAGNOSIS — W19XXXD Unspecified fall, subsequent encounter: Secondary | ICD-10-CM | POA: Diagnosis not present

## 2015-11-07 DIAGNOSIS — I4581 Long QT syndrome: Secondary | ICD-10-CM

## 2015-11-07 DIAGNOSIS — K8689 Other specified diseases of pancreas: Secondary | ICD-10-CM | POA: Diagnosis not present

## 2015-11-07 DIAGNOSIS — K838 Other specified diseases of biliary tract: Secondary | ICD-10-CM | POA: Diagnosis not present

## 2015-11-07 DIAGNOSIS — S32810A Multiple fractures of pelvis with stable disruption of pelvic ring, initial encounter for closed fracture: Secondary | ICD-10-CM | POA: Diagnosis not present

## 2015-11-07 DIAGNOSIS — I10 Essential (primary) hypertension: Secondary | ICD-10-CM | POA: Diagnosis not present

## 2015-11-07 DIAGNOSIS — R935 Abnormal findings on diagnostic imaging of other abdominal regions, including retroperitoneum: Secondary | ICD-10-CM | POA: Diagnosis not present

## 2015-11-07 MED ORDER — SENNOSIDES-DOCUSATE SODIUM 8.6-50 MG PO TABS
1.0000 | ORAL_TABLET | Freq: Two times a day (BID) | ORAL | Status: DC
  Administered 2015-11-07 – 2015-11-08 (×3): 1 via ORAL
  Filled 2015-11-07 (×3): qty 1

## 2015-11-07 NOTE — Progress Notes (Signed)
[facility] PROGRESS NOTE  Katherine Flynn [id number]:[id number] DOB: 06/14/1923 DOA: 11/04/2015 PCP: Rogelia Boga, MD  Assessment/Plan:  Multiple pelvic fractures/ Fall at home - left superior pubic ramus, right inferior pubic ramus fracture, nondisplaced right sacral fracture -s/p mechanical fall -Orthopedic consult appreciated, recommended weightbearing as tolerated using a walker with physical therapy, consideration for skilled nursing,  Expected healing time will be a few months -Some Bleeding noted on CT scan secondary to pelvic fractures-EDP Dr. Adela Lank discussed with Dr. Lindie Spruce of trauma surgery who felt this was not significant bleeding  -Pain control--IV morphine for severe pain and oxy IR for moderate pain -CSW consult for SNF for rehab -unfortunately PO intake very poor   Hypothyroidism -TSH very high, hence I doubled synthroid dose   Essential hypertension -stable, continue home meds   Prolonged Q-T interval on ECG/RBBB -Follow on telemetry -Avoid offending medication   Iron deficiency anemia -Hb down a little from dilution -cbc in am   Protein calorie malnutrition  -Nutrition consultation   Dilated bile duct/Pancreatic duct dilated  -No documented history of prior cholecystectomy -Radiologist questions possible nonvisible pancreatic mass and recommends nonemergent MR or CT (pre and multiphasic postcontrast) of pancreas -lipase and CA 19.9 normal   Abnormal findings CT abdomen/? Left pulmonary mucocele -Patient recently treated as an outpatient for nonbacterial bronchitis -Follow clinically  DVT proph: SCDs  Code Status: DNR Family communication Dispo: SNF tomorrow if bed available  Consultants:  Ortho  D/w Trauma Surgery  HPI/Subjective: Feels ok, pain with movt at hip  Objective: Filed Vitals:   11/07/15 0619 11/07/15 1100  BP: 175/78 159/70  Pulse: 98 90  Temp: 98.6 F (37 C) 99.3 F (37.4 C)  Resp: 18 22     Intake/Output Summary (Last 24 hours) at 11/07/15 1344 Last data filed at 11/06/15 2236  Gross per 24 hour  Intake    120 ml  Output    300 ml  Net   -180 ml   Filed Weights   11/04/15 2103 11/05/15 2155  Weight: 46.4 kg (102 lb 4.7 oz) 47.31 kg (104 lb 4.8 oz)    Exam:   General: AAOx3, frail elderly, no distress  Cardiovascular: S1S2/RRR  Respiratory: CTAB  Abdomen: soft, NT, BS present  Musculoskeletal: no edema c/c   Data Reviewed: Basic Metabolic Panel:  Recent Labs Lab 11/04/15 1019 11/04/15 1610 11/05/15 0331 11/06/15 0558  NA 140  --  140 138  K 3.7  --  3.5 3.5  CL 105  --  103 106  CO2 23  --  25 24  GLUCOSE 196*  --  108* 99  BUN 14  --  16 17  CREATININE 0.70  --  0.69 0.65  CALCIUM 9.5 9.4 9.3 8.3*  MG  --  1.8  --   --   PHOS  --  3.7  --   --    Liver Function Tests:  Recent Labs Lab 11/05/15 0331  AST 30  ALT 19  ALKPHOS 59  BILITOT 0.7  PROT 5.9*  ALBUMIN 3.4*    Recent Labs Lab 11/04/15 1610  LIPASE 72*   No results for input(s): AMMONIA in the last 168 hours. CBC:  Recent Labs Lab 11/04/15 1019 11/04/15 1610 11/05/15 0335 11/06/15 0558  WBC 8.7 8.4 8.0 6.8  NEUTROABS 7.4 7.4 5.7  --   HGB 12.3 12.1 11.5* 10.8*  HCT 37.4 37.8 35.0* 33.2*  MCV 91.4 91.5 93.8 93.5  PLT 253 253 213 208  Cardiac Enzymes: No results for input(s): CKTOTAL, CKMB, CKMBINDEX, TROPONINI in the last 168 hours. BNP (last 3 results) No results for input(s): BNP in the last 8760 hours.  ProBNP (last 3 results) No results for input(s): PROBNP in the last 8760 hours.  CBG: No results for input(s): GLUCAP in the last 168 hours.  No results found for this or any previous visit (from the past 240 hour(s)).   Studies: No results found.  Scheduled Meds: . feeding supplement (ENSURE ENLIVE)  237 mL Oral BID BM  . levothyroxine  150 mcg Oral QAC breakfast  . lisinopril  20 mg Oral Daily  . multivitamin with minerals  1 tablet Oral  Daily  . senna-docusate  1 tablet Oral BID  . sodium chloride flush  3 mL Intravenous Q12H   Continuous Infusions:   Antibiotics Given (last 72 hours)    None      Principal Problem:   Multiple pelvic fractures (HCC) Active Problems:   Hypothyroidism   Essential hypertension   Iron deficiency anemia   Protein calorie malnutrition (HCC)   Fall at home   Prolonged Q-T interval on ECG   RBBB   Dilated bile duct   Pancreatic duct dilated (HCC)   Abnormal CT of the abdomen    Time spent:    [facility]  [facility] Pager [phone]. If 7PM-7AM, please contact night-coverage at [email or web address], password [facility] 11/07/2015, 1:44 PM  LOS: 3 days

## 2015-11-08 DIAGNOSIS — W19XXXD Unspecified fall, subsequent encounter: Secondary | ICD-10-CM | POA: Diagnosis not present

## 2015-11-08 DIAGNOSIS — K838 Other specified diseases of biliary tract: Secondary | ICD-10-CM | POA: Diagnosis not present

## 2015-11-08 DIAGNOSIS — J449 Chronic obstructive pulmonary disease, unspecified: Secondary | ICD-10-CM | POA: Diagnosis not present

## 2015-11-08 DIAGNOSIS — I10 Essential (primary) hypertension: Secondary | ICD-10-CM | POA: Diagnosis not present

## 2015-11-08 DIAGNOSIS — K219 Gastro-esophageal reflux disease without esophagitis: Secondary | ICD-10-CM | POA: Diagnosis not present

## 2015-11-08 DIAGNOSIS — I4581 Long QT syndrome: Secondary | ICD-10-CM | POA: Diagnosis not present

## 2015-11-08 DIAGNOSIS — S32810A Multiple fractures of pelvis with stable disruption of pelvic ring, initial encounter for closed fracture: Secondary | ICD-10-CM | POA: Diagnosis not present

## 2015-11-08 DIAGNOSIS — R935 Abnormal findings on diagnostic imaging of other abdominal regions, including retroperitoneum: Secondary | ICD-10-CM | POA: Diagnosis not present

## 2015-11-08 DIAGNOSIS — Y92099 Unspecified place in other non-institutional residence as the place of occurrence of the external cause: Secondary | ICD-10-CM | POA: Diagnosis not present

## 2015-11-08 DIAGNOSIS — D6489 Other specified anemias: Secondary | ICD-10-CM | POA: Diagnosis not present

## 2015-11-08 DIAGNOSIS — I4510 Unspecified right bundle-branch block: Secondary | ICD-10-CM | POA: Diagnosis not present

## 2015-11-08 DIAGNOSIS — S0101XA Laceration without foreign body of scalp, initial encounter: Secondary | ICD-10-CM | POA: Diagnosis not present

## 2015-11-08 DIAGNOSIS — E079 Disorder of thyroid, unspecified: Secondary | ICD-10-CM | POA: Diagnosis not present

## 2015-11-08 DIAGNOSIS — S32810D Multiple fractures of pelvis with stable disruption of pelvic ring, subsequent encounter for fracture with routine healing: Secondary | ICD-10-CM | POA: Diagnosis not present

## 2015-11-08 DIAGNOSIS — G894 Chronic pain syndrome: Secondary | ICD-10-CM | POA: Diagnosis not present

## 2015-11-08 DIAGNOSIS — H3530 Unspecified macular degeneration: Secondary | ICD-10-CM | POA: Diagnosis not present

## 2015-11-08 DIAGNOSIS — M1990 Unspecified osteoarthritis, unspecified site: Secondary | ICD-10-CM | POA: Diagnosis not present

## 2015-11-08 DIAGNOSIS — M810 Age-related osteoporosis without current pathological fracture: Secondary | ICD-10-CM | POA: Diagnosis not present

## 2015-11-08 DIAGNOSIS — M978XXD Periprosthetic fracture around other internal prosthetic joint, subsequent encounter: Secondary | ICD-10-CM | POA: Diagnosis not present

## 2015-11-08 DIAGNOSIS — S329XXA Fracture of unspecified parts of lumbosacral spine and pelvis, initial encounter for closed fracture: Secondary | ICD-10-CM | POA: Diagnosis not present

## 2015-11-08 DIAGNOSIS — S32301D Unspecified fracture of right ilium, subsequent encounter for fracture with routine healing: Secondary | ICD-10-CM | POA: Diagnosis not present

## 2015-11-08 DIAGNOSIS — K8689 Other specified diseases of pancreas: Secondary | ICD-10-CM | POA: Diagnosis not present

## 2015-11-08 DIAGNOSIS — E038 Other specified hypothyroidism: Secondary | ICD-10-CM | POA: Diagnosis not present

## 2015-11-08 DIAGNOSIS — Z96649 Presence of unspecified artificial hip joint: Secondary | ICD-10-CM | POA: Diagnosis not present

## 2015-11-08 DIAGNOSIS — R102 Pelvic and perineal pain: Secondary | ICD-10-CM | POA: Diagnosis not present

## 2015-11-08 DIAGNOSIS — R2689 Other abnormalities of gait and mobility: Secondary | ICD-10-CM | POA: Diagnosis not present

## 2015-11-08 LAB — BASIC METABOLIC PANEL
Anion gap: 11 (ref 5–15)
BUN: 15 mg/dL (ref 6–20)
CALCIUM: 8.8 mg/dL — AB (ref 8.9–10.3)
CO2: 24 mmol/L (ref 22–32)
CREATININE: 0.66 mg/dL (ref 0.44–1.00)
Chloride: 103 mmol/L (ref 101–111)
GFR calc non Af Amer: >60 mL/min (ref >60)
Glucose, Bld: 124 mg/dL — ABNORMAL HIGH (ref 65–99)
Potassium: 3.6 mmol/L (ref 3.5–5.1)
Sodium: 138 mmol/L (ref 135–145)

## 2015-11-08 LAB — CBC
HCT: 34.9 % — ABNORMAL LOW (ref 36.0–46.0)
Hemoglobin: 11.3 g/dL — ABNORMAL LOW (ref 12.0–15.0)
MCH: 29.7 pg (ref 26.0–34.0)
MCHC: 32.4 g/dL (ref 30.0–36.0)
MCV: 91.8 fL (ref 78.0–100.0)
PLATELETS: 252 10*3/uL (ref 150–400)
RBC: 3.8 MIL/uL — ABNORMAL LOW (ref 3.87–5.11)
RDW: 13.3 % (ref 11.5–15.5)
WBC: 5.8 10*3/uL (ref 4.0–10.5)

## 2015-11-08 MED ORDER — SENNOSIDES-DOCUSATE SODIUM 8.6-50 MG PO TABS: 1.0000 | ORAL_TABLET | Freq: Two times a day (BID) | ORAL | Status: DC

## 2015-11-08 MED ORDER — OXYCODONE HCL 5 MG PO TABS: 5.0000 mg | ORAL_TABLET | ORAL | Status: DC | PRN

## 2015-11-08 NOTE — Clinical Social Work Placement (Addendum)
   CLINICAL SOCIAL WORK PLACEMENT  NOTE 11/08/15 - DISCHARGED TO [facility]  Date:  11/08/2015  Patient Details  Name: [name] MRN: [id number] Date of Birth: May 17, 1923  Clinical Social Work is seeking post-discharge placement for this patient at the   level of care (*CSW will initial, date and re-position this form in  chart as items are completed):  Yes   Patient/family provided with [place] Clinical Social Work Department's list of facilities offering this level of care within the geographic area requested by the patient (or if unable, by the patient's family).  Yes   Patient/family informed of their freedom to choose among providers that offer the needed level of care, that participate in Medicare, Medicaid or managed care program needed by the patient, have an available bed and are willing to accept the patient.  Yes   Patient/family informed of [facility]'s ownership interest in [facility] and [facility], as well as of the fact that they are under no obligation to receive care at these facilities.  PASRR submitted to EDS on  04/21/15     PASRR number received on  04/21/15 - [id number] - [place] MUST has patient's last name as Bottomly.    Existing PASRR number confirmed on  11/08/2015     FL2 transmitted to all facilities in geographic area requested by pt/family on 11/06/15     FL2 transmitted to all facilities within larger geographic area on       Patient informed that his/her managed care company has contracts with or will negotiate with certain facilities, including the following:         YES - Patient/family informed of bed offers received.  Patient chooses bed at  [facility].     Physician recommends and patient chooses bed at      Patient to be transferred to  [facility] on  11/08/15.  Patient to be transferred to facility by  ambulance     Patient family notified on  11/08/15 of transfer.  Name of  family member notified:   Cherrelle, [place] [phone]).     PHYSICIAN Please sign FL2     Additional Comment:   11/08/15 - Received insurance authorization from [place] - [id number] effective 11/08/15 for 7 days.   _______________________________________________ Cristobal Goldmann, LCSW 11/08/2015, 1:56 PM

## 2015-11-08 NOTE — Progress Notes (Signed)
Pt discharged to [facility]. Pt. Is alert and oriented. Pt is hemodynamically stable. Patient has been picked up by transport.

## 2015-11-08 NOTE — Discharge Summary (Signed)
Physician Discharge Summary  Katherine Flynn JWJ:[id number] DOB: [birth year] DOA: 11/04/2015  PCP: Rogelia Boga, MD  Admit date: 11/04/2015 Discharge date: 11/08/2015  Time spent: 45 minutes  Recommendations for Outpatient Follow-up:  1. PCP in 1 week 2. If has ongoing failure to thrive consider Hospice involvement   Discharge Diagnoses:  Principal Problem:   Multiple pelvic fractures (HCC) Active Problems:   Hypothyroidism   Essential hypertension   Iron deficiency anemia   Protein calorie malnutrition (HCC)   Fall at home   Prolonged Q-T interval on ECG   RBBB   Dilated bile duct   Pancreatic duct dilated (HCC)   Abnormal CT of the abdomen   Discharge Condition: stable  Diet recommendation: Heart healthy  Filed Weights   11/04/15 2103 11/05/15 2155  Weight: 46.4 kg (102 lb 4.7 oz) 47.31 kg (104 lb 4.8 oz)    History of present illness:   Chief complaint:Fall   HPI: 82 year old female patient with history of dementia, hypertension, hypothyroidism, iron deficiency anemia and previous falls with closed fracture of right iliac wing requiring rehabilitative therapy who presented to the hospital with pelvic pain after fall at home. Patient became unsteady on her feet was having difficulty moving her feet and her feet became entangled she slipped and fell , since then was complaining of both hips hurting.She chronically has poor oral intake       Hospital Course:  Multiple pelvic fractures/ Fall at home - left superior pubic ramus, right inferior pubic ramus fracture, nondisplaced right sacral fracture -s/p mechanical fall -Orthopedic consulted, recommended weightbearing as tolerated using a walker with physical therapy, consideration for skilled nursing, Expected healing time will be a few months -Some Bleeding noted on CT scan secondary to pelvic fractures-This was discussed with Dr. Lindie Spruce of trauma surgery who felt this was not significant bleeding   -Pain control used Iv morphine for severe pain and oxy IR for moderate pain -CSW consulted for SNF for rehab, PT/OT eval completed and SNF recommended -unfortunately PO intake continues to be poor like it is at baseline, would recommend consideration of Hospice if she has continued failure to thrive   Hypothyroidism -TSH very high, hence I doubled synthroid dose -repeat TSH in 2months   Essential hypertension -stable, continue home meds   Prolonged Q-T interval on ECG/RBBB -Follow on telemetry -Avoid offending medication   Iron deficiency anemia -Hb down a little from dilution -stable since   Protein calorie malnutrition  -Nutrition consultation   Dilated bile duct/Pancreatic duct dilated  -No documented history of prior cholecystectomy -Radiologist questions possible nonvisible pancreatic mass and recommends nonemergent MR or CT (pre and multiphasic postcontrast) of pancreas -lipase and CA 19.9 normal   Abnormal findings CT abdomen/? Left pulmonary mucocele -Patient recently treated as an outpatient for nonbacterial bronchitis -monitor periodically   Consultations:  Ortho Dr.Rowan  Discharge Exam: Filed Vitals:   11/08/15 0500 11/08/15 0815  BP: 135/59 139/62  Pulse: 86 88  Temp: 98.9 F (37.2 C) 97.7 F (36.5 C)  Resp: 20 20    General:AAOx3 Cardiovascular:S1S2/RRR Respiratory: CTAB  Discharge Instructions   Discharge Instructions    Diet - low sodium heart healthy    Complete by:  As directed      Increase activity slowly    Complete by:  As directed           Current Discharge Medication List    START taking these medications   Details  oxyCODONE (OXY IR/ROXICODONE) 5 MG immediate release  tablet Take 1 tablet (5 mg total) by mouth every 4 (four) hours as needed for moderate pain. Qty: 30 tablet, Refills: 0    senna-docusate (SENOKOT-S) 8.6-50 MG tablet Take 1 tablet by mouth 2 (two) times daily.      CONTINUE these medications  which have NOT CHANGED   Details  levothyroxine (SYNTHROID, LEVOTHROID) 75 MCG tablet TAKE ONE TABLET BY MOUTH ONCE DAILY Qty: 90 tablet, Refills: 1    lisinopril (PRINIVIL,ZESTRIL) 20 MG tablet TAKE ONE TABLET BY MOUTH ONCE DAILY Qty: 90 tablet, Refills: 1    Multiple Vitamin (MULTIVITAMIN WITH MINERALS) TABS tablet Take 1 tablet by mouth daily.    metoprolol tartrate (LOPRESSOR) 25 MG tablet Take 1 tablet (25 mg total) by mouth 2 (two) times daily.      STOP taking these medications     docusate sodium (COLACE) 100 MG capsule        No Known Allergies Follow-up Information    Follow up with Rogelia Boga, MD. Schedule an appointment as soon as possible for a visit in 1 week.   Specialty:  Internal Medicine   Contact information:   [address] [place] [address] [phone]        The results of significant diagnostics from this hospitalization (including imaging, microbiology, ancillary and laboratory) are listed below for reference.    Significant Diagnostic Studies: Ct Head Wo Contrast  11/04/2015  CLINICAL DATA:  Patient fell last night injuring head and both hips. EXAM: CT HEAD WITHOUT CONTRAST TECHNIQUE: Contiguous axial images were obtained from the base of the skull through the vertex without intravenous contrast. COMPARISON:  CT head 02/10/2015. FINDINGS: Brain: There is no evidence of acute intracranial hemorrhage, mass lesion, brain edema or extra-axial fluid collection. The ventricles and subarachnoid spaces are diffusely prominent but stable. There is no CT evidence of acute cortical infarction. There is stable confluent low-density within the periventricular white matter. Intracranial vascular calcifications are noted. Bones/sinuses/visualized face: The visualized paranasal sinuses, mastoid air cells and middle ears are clear. The calvarium is intact. The right parietal scalp hematoma demonstrated previously has resolved. IMPRESSION: No acute  intracranial findings. Stable chronic atrophy and periventricular white matter disease, likely related to chronic small vessel ischemic changes. Electronically Signed   By: Carey Bullocks M.D.   On: 11/04/2015 12:21   Ct Abdomen Pelvis W Contrast  11/04/2015  CLINICAL DATA:  Pain following fall 1 day prior EXAM: CT ABDOMEN AND PELVIS WITH CONTRAST TECHNIQUE: Multidetector CT imaging of the abdomen and pelvis was performed using the standard protocol following bolus administration of intravenous contrast. CONTRAST:  80mL OMNIPAQUE IOHEXOL 300 MG/ML  SOLN COMPARISON:  Pelvis and hip radiographs November 04, 2015; CT abdomen and pelvis July 06, 2015 FINDINGS: Lower chest: There is scarring in each lung base region. There is a probable mucocele in the superior segment left lower lobe measuring 1.4 x 0.9 cm. There is no basilar pneumothorax. There is atelectatic change in the medial segment of the right middle lobe adjacent to the heart border. There is lower lobe bronchiectatic change bilaterally. There is extensive coronary artery calcification as well as mitral annular calcification. Hepatobiliary: The liver appears intact without laceration or rupture. No perihepatic fluid is identified. No focal liver lesions are identified. The gallbladder is mildly distended without appreciable wall thickening. There is mild intrahepatic biliary duct dilatation as well as generalized common bile duct dilatation. The distal common bile duct measures 10 mm. No biliary duct mass or calculus is  evident. Pancreas: There is no pancreatic inflammatory focus or traumatic lesion. There is dilatation of the pancreatic duct in the pancreatic head region to approximately 6 mm. No well-defined pancreatic mass seen. Adrenals/Urinary Tract: No adrenal lesions are identified. There are small cysts in the upper pole of each kidney. There is a cyst in the lower pole of the right kidney measuring 3.5 x 3.1 cm. There is a cyst in the mid left  kidney measuring 1.3 x 0.9 cm. There is no contrast extravasation or perinephric stranding on either side. No renal or ureteral calculi. There is no appreciable wall thickening involving the urinary bladder. There is complex fluid immediately adjacent to the anterior and leftward aspect of the urinary bladder. Stomach/Bowel: There is wall thickening in the distal stomach and proximal duodenum. No other bowel wall thickening is seen. No appreciable mesenteric thickening. No bowel obstruction. No free air or portal venous air. Vascular/Lymphatic: Aorta is diffusely tortuous but nonaneurysmal. There is extensive atherosclerotic calcification throughout the aorta and iliac arteries. There is also moderate calcification in the proximal mesenteric vessels bilaterally. No contrast extravasation is noted surrounding the major mesenteric vessels. No fluid collections in these areas. No adenopathy appreciable in the abdomen or pelvis. Reproductive: Uterus appears absent. No pelvic mass or free pelvic fluid. There is fluid in the anterior pelvis near the bladder as noted above. Other: Appendix appears normal. No abscess or ascites in the abdomen or pelvis. Musculoskeletal: Total hip replacement noted on the right. There is an old fracture of the mid right iliac crest. There is a fracture of the superior, lateral right sacral ala without appreciable surrounding hemorrhage. There is is an acute fracture of the right ischium medially as well as an acute fracture of the left medial superior pubic ramus. There is an apparent old fracture of the right superior pubic ramus. There is apparent hemorrhage anterior to the pubic symphysis as well as slightly superior to the fracture of the superior pubic ramus on the left. There is advanced lumbar arthropathy. There is no intramuscular or abdominal wall lesion. IMPRESSION: There are fractures in the pelvis with apparent hemorrhage in the anterior pelvis. Fluid is noted adjacent to the  urinary bladder anteriorly and toward the left. This fluid is probably secondary to the pelvic fractures. There is no obvious disruption of the urinary bladder. If the patient has some suggesting potential bladder injury, however, correlation with cystoscopy or cystography may be reasonable. There is prominence of the pancreatic duct in the pancreatic head region as well as biliary duct dilatation. A well-defined mass in the head of the pancreas is not seen. These findings, however, are concerning for a potential small pancreatic mass. Given these findings, nonemergent pre and multiphasic post-contrast MR or CT of the pancreas would be advisable. The Wall thickening in the distal stomach and proximal duodenum. Probable gastritis and proximal duodenitis. No obvious bowel injury. Bronchiectatic change in the lung bases. Probable mucocele in the inferior aspect of the superior segment left lower lobe. Advise follow-up chest CT in 8-10 weeks to assess for stability of this finding. Extensive atherosclerotic calcification. Electronically Signed   By: Bretta Bang III M.D.   On: 11/04/2015 13:12   Ct L-spine No Charge  11/04/2015  CLINICAL DATA:  Fall with back pain. EXAM: CT LUMBAR SPINE WITHOUT CONTRAST TECHNIQUE: Coned in and reformatted images of the lumbar spine were obtained from abdominal CT data set. COMPARISON:  None. FINDINGS: Abdominal CT reported separately. Bronchiectatic changes at the left base  are well seen on these coned in images. There is no evidence of lumbar spine fracture or traumatic malalignment. No gross canal hematoma. Lumbar exam there is a partly visualized sacral ala fracture on the right, not involving sacral foramina. There has been a remote right iliac wing fracture. Right obturator ring fractures reported separately. There is diffuse and advanced degenerative disc and facet disease with levoscoliosis. There is left-sided L4 and L5 foraminal compression due to scoliosis and  spurring. Multilevel disc bulging and canal narrowing. IMPRESSION: 1. No evidence of acute lumbar spine injury. 2. Reference abdominal CT report concerning right sacral ala and obturator ring fractures. Electronically Signed   By: Marnee Spring M.D.   On: 11/04/2015 13:14   Dg Hips Bilat With Pelvis Min 5 Views  11/04/2015  CLINICAL DATA:  Larey Seat this morning.  Bilateral hip pain. EXAM: DG HIP (WITH OR WITHOUT PELVIS) 5+V BILAT COMPARISON:  CT 07/05/2005 and 04/18/2015 FINDINGS: Right hip arthroplasty is located. There is no evidence for an acute periprosthetic fracture. Left hip is located without a fracture. However, there is new irregularity involving the pubic rami bilaterally. There appears to be fractures involving the left superior pubic ramus and a mildly displaced fracture involving the right inferior pubic ramus. In addition, there may be a new fracture involving the right side of the sacrum. Old deformity of the right superior pubic ramus. Again noted is sclerosis from old fracture in the right ilium. There is also an old fracture involving the right femur greater trochanter. Scoliosis and degenerative changes in the lower lumbar spine. IMPRESSION: Bilateral pubic rami fractures. Evidence for acute fractures involving the left superior pubic ramus and right inferior pubic ramus. Question a fracture of the right sacrum. Both hips are located. Old fractures involving the right ilium and right proximal femur. Electronically Signed   By: Richarda Overlie M.D.   On: 11/04/2015 10:39    Microbiology: No results found for this or any previous visit (from the past 240 hour(s)).   Labs: Basic Metabolic Panel:  Recent Labs Lab 11/04/15 1019 11/04/15 1610 11/05/15 0331 11/06/15 0558 11/08/15 0516  NA 140  --  140 138 138  K 3.7  --  3.5 3.5 3.6  CL 105  --  103 106 103  CO2 23  --  GLUCOSE 196*  --  108* 99 124*  BUN 14  --  CREATININE 0.70  --  0.69 0.65 0.66  CALCIUM 9.5 9.4  9.3 8.3* 8.8*  MG  --  1.8  --   --   --   PHOS  --  3.7  --   --   --    Liver Function Tests:  Recent Labs Lab 11/05/15 0331  AST 30  ALT 19  ALKPHOS 59  BILITOT 0.7  PROT 5.9*  ALBUMIN 3.4*    Recent Labs Lab 11/04/15 1610  LIPASE 72*   No results for input(s): AMMONIA in the last 168 hours. CBC:  Recent Labs Lab 11/04/15 1019 11/04/15 1610 11/05/15 0335 11/06/15 0558 11/08/15 0516  WBC 8.7 8.4 8.0 6.8 5.8  NEUTROABS 7.4 7.4 5.7  --   --   HGB 12.3 12.1 11.5* 10.8* 11.3*  HCT 37.4 37.8 35.0* 33.2* 34.9*  MCV 91.4 91.5 93.8 93.5 91.8  PLT 253 253 213 208 252   Cardiac Enzymes: No results for input(s): CKTOTAL, CKMB, CKMBINDEX, TROPONINI in the last 168 hours. BNP: BNP (last 3 results) No  results for input(s): BNP in the last 8760 hours.  ProBNP (last 3 results) No results for input(s): PROBNP in the last 8760 hours.  CBG: No results for input(s): GLUCAP in the last 168 hours.     SignedZannie Cove MD.  [facility] 11/08/2015, 1:37 PM

## 2015-11-08 NOTE — Progress Notes (Signed)
OT Cancellation Note  Patient Details Name: Katherine Flynn MRN: [id number] DOB: 1923/02/23   Cancelled Treatment:    Reason Eval/Treat Not Completed: Other (comment) Pt apparently planning to D/C to SNF today. Will defer OT to SNF. If eval needed, please page number below. Thanks [facility] Mylan Lengyel, OTR/L  [phone] 11/08/2015 11/08/2015, 2:35 PM

## 2015-11-08 NOTE — Progress Notes (Signed)
Physical Therapy Treatment Patient Details Name: Katherine Flynn MRN: [id number] DOB: 1923/08/21 Today's Date: 11/08/2015    History of Present Illness 82 year old female with history of dementia, hypertension, hypothyroidism, iron deficiency anemia, with history of multiple falls in the past, presents with a fall, workup significant for multiple pelvic fractures with hemorrhage, patient admitted for pain control, seen by orthopedic, nonsurgical fracture, recommend pain control and PT    PT Comments    Pt making slow progress towards PT goals and remains limited by increase in hip pain with all mobility. Pt remains in good spirits and willing to try everything asked. Pt requires Mod-Max A x 2 for all bed mobility and transfers at this time. Deferred gait training and SPT due to pain tolerance. With max cueing pt able to make miniscule steps with RW to reposition at EOB. Continuing to recommend SNF upon D/C to maximize functional recovery towards baseline level of Mod I.   Follow Up Recommendations  SNF;Supervision/Assistance - 24 hour     Equipment Recommendations  None recommended by PT    Recommendations for Other Services       Precautions / Restrictions Precautions Precautions: Fall Precaution Comments: hip fractures Restrictions Weight Bearing Restrictions: Yes RLE Weight Bearing: Weight bearing as tolerated LLE Weight Bearing: Weight bearing as tolerated    Mobility  Bed Mobility Overal bed mobility: Needs Assistance Bed Mobility: Supine to Sit;Sit to Supine     Supine to sit: Mod assist Sit to supine: Mod assist   General bed mobility comments: attempted to roll however too painful, assisted pt to bring UEs to bed rail, assist to bring LEs off of bed, cueing to push with upper extremities, pt providing some assistance to bring hips to EOB, draw sheet to complete hip positioning  Transfers Overall transfer level: Needs assistance Equipment used: Rolling walker  (2 wheeled) Transfers: Sit to/from Stand Sit to Stand: Mod assist;+2 physical assistance         General transfer comment: Pt pulling up on RW vs using bed, utilized bed pad to assist with hip extension while standing, pt initiates movement but needing assistance throughout transfer, excessive trunk flexion which pt was able to fix for ~5 seconds with cueing, cues to push through UEs on RW to decrease weight through hips and to bring R foot flat on the floor  Ambulation/Gait             General Gait Details: deferred due to pain and minimal advancment on LEs during transfers   Stairs            Wheelchair Mobility    Modified Rankin (Stroke Patients Only)       Balance Overall balance assessment: Needs assistance Sitting-balance support: Feet unsupported;Bilateral upper extremity supported Sitting balance-Leahy Scale: Fair Sitting balance - Comments: sits EOB and maintains balance while assisting to change gown   Standing balance support: Bilateral upper extremity supported Standing balance-Leahy Scale: Poor Standing balance comment: excessive posterior lean in standing at RW                    Cognition Arousal/Alertness: Awake/alert Behavior During Therapy: [facility] for tasks assessed/performed Overall Cognitive Status: History of cognitive impairments - at baseline                      Exercises General Exercises - Lower Extremity Ankle Circles/Pumps: AROM;10 reps;Supine;Both Long Arc Quad: AROM;Both;5 reps;Seated;Limitations Long [employer] Limitations: trace quad contraction Hip Flexion/Marching: AROM;Both;5 reps;Standing;Limitations  Hip Flexion/Marching Limitations: clears R foot from ground, unable to weight shift to clear Left    General Comments General comments (skin integrity, edema, etc.): attempted sit to stand with three trials      Pertinent Vitals/Pain Pain Assessment: Faces Faces Pain Scale: Hurts whole lot Pain Location: hips  with any movement Pain Descriptors / Indicators: Grimacing;Discomfort Pain Intervention(s): Limited activity within patient's tolerance;Premedicated before session    Home Living                      Prior Function            PT Goals (current goals can now be found in the care plan section) Acute Rehab PT Goals Patient Stated Goal: decrease pain Progress towards PT goals: Progressing toward goals    Frequency  Min 3X/week    PT Plan Current plan remains appropriate    Co-evaluation             End of Session Equipment Utilized During Treatment: Gait belt Activity Tolerance: Patient limited by pain Patient left: in bed;with call bell/phone within reach     Time: 1430-1447 PT Time Calculation (min) (ACUTE ONLY): 17 min  Charges:                       G Codes:      Ulyses Jarred November 16, 2015, 3:01 PM Ulyses Jarred, Student Physical Therapist Acute Rehab [phone]

## 2015-11-09 ENCOUNTER — Telehealth: Payer: Self-pay

## 2015-11-09 NOTE — Telephone Encounter (Signed)
First attempt for TCM  

## 2015-11-14 DIAGNOSIS — R2689 Other abnormalities of gait and mobility: Secondary | ICD-10-CM | POA: Diagnosis not present

## 2015-11-14 DIAGNOSIS — E038 Other specified hypothyroidism: Secondary | ICD-10-CM | POA: Diagnosis not present

## 2015-11-14 DIAGNOSIS — I10 Essential (primary) hypertension: Secondary | ICD-10-CM | POA: Diagnosis not present

## 2015-11-14 DIAGNOSIS — S32301D Unspecified fracture of right ilium, subsequent encounter for fracture with routine healing: Secondary | ICD-10-CM | POA: Diagnosis not present

## 2015-11-14 DIAGNOSIS — M810 Age-related osteoporosis without current pathological fracture: Secondary | ICD-10-CM | POA: Diagnosis not present

## 2015-11-14 DIAGNOSIS — D6489 Other specified anemias: Secondary | ICD-10-CM | POA: Diagnosis not present

## 2015-11-19 ENCOUNTER — Ambulatory Visit: Payer: Commercial Managed Care - HMO | Admitting: Internal Medicine

## 2015-12-10 ENCOUNTER — Telehealth: Payer: Self-pay | Admitting: Internal Medicine

## 2015-12-10 DIAGNOSIS — S32810D Multiple fractures of pelvis with stable disruption of pelvic ring, subsequent encounter for fracture with routine healing: Secondary | ICD-10-CM | POA: Diagnosis not present

## 2015-12-10 DIAGNOSIS — I10 Essential (primary) hypertension: Secondary | ICD-10-CM | POA: Diagnosis not present

## 2015-12-10 DIAGNOSIS — Z9181 History of falling: Secondary | ICD-10-CM | POA: Diagnosis not present

## 2015-12-10 DIAGNOSIS — M6281 Muscle weakness (generalized): Secondary | ICD-10-CM | POA: Diagnosis not present

## 2015-12-10 DIAGNOSIS — G894 Chronic pain syndrome: Secondary | ICD-10-CM | POA: Diagnosis not present

## 2015-12-10 DIAGNOSIS — J449 Chronic obstructive pulmonary disease, unspecified: Secondary | ICD-10-CM | POA: Diagnosis not present

## 2015-12-10 DIAGNOSIS — F0390 Unspecified dementia without behavioral disturbance: Secondary | ICD-10-CM | POA: Diagnosis not present

## 2015-12-10 DIAGNOSIS — E46 Unspecified protein-calorie malnutrition: Secondary | ICD-10-CM | POA: Diagnosis not present

## 2015-12-10 NOTE — Telephone Encounter (Signed)
Okay for order for Speech Therapy and Child psychotherapistocial Worker for pt?

## 2015-12-10 NOTE — Telephone Encounter (Signed)
ok 

## 2015-12-10 NOTE — Telephone Encounter (Signed)
Pt was seen today for physical therapy. Theron Aristaeter would like order for speech and Child psychotherapistsocial worker as well. Pt was discharge for [facility] . Verbal order is ok

## 2015-12-10 NOTE — Telephone Encounter (Signed)
Spoke to Halliburton CompanyPeter, verbal orders given for Occidental PetroleumSpeech Therapy and Child psychotherapistocial Worker for pt okay per Dr.K. Theron AristaPeter verbalized understanding.

## 2015-12-13 DIAGNOSIS — Z9181 History of falling: Secondary | ICD-10-CM | POA: Diagnosis not present

## 2015-12-13 DIAGNOSIS — F0390 Unspecified dementia without behavioral disturbance: Secondary | ICD-10-CM | POA: Diagnosis not present

## 2015-12-13 DIAGNOSIS — J449 Chronic obstructive pulmonary disease, unspecified: Secondary | ICD-10-CM | POA: Diagnosis not present

## 2015-12-13 DIAGNOSIS — I10 Essential (primary) hypertension: Secondary | ICD-10-CM | POA: Diagnosis not present

## 2015-12-13 DIAGNOSIS — S32810D Multiple fractures of pelvis with stable disruption of pelvic ring, subsequent encounter for fracture with routine healing: Secondary | ICD-10-CM | POA: Diagnosis not present

## 2015-12-13 DIAGNOSIS — E46 Unspecified protein-calorie malnutrition: Secondary | ICD-10-CM | POA: Diagnosis not present

## 2015-12-13 DIAGNOSIS — M6281 Muscle weakness (generalized): Secondary | ICD-10-CM | POA: Diagnosis not present

## 2015-12-13 DIAGNOSIS — G894 Chronic pain syndrome: Secondary | ICD-10-CM | POA: Diagnosis not present

## 2015-12-14 ENCOUNTER — Telehealth: Payer: Self-pay | Admitting: *Deleted

## 2015-12-14 DIAGNOSIS — J449 Chronic obstructive pulmonary disease, unspecified: Secondary | ICD-10-CM | POA: Diagnosis not present

## 2015-12-14 DIAGNOSIS — M6281 Muscle weakness (generalized): Secondary | ICD-10-CM | POA: Diagnosis not present

## 2015-12-14 DIAGNOSIS — G894 Chronic pain syndrome: Secondary | ICD-10-CM | POA: Diagnosis not present

## 2015-12-14 DIAGNOSIS — I10 Essential (primary) hypertension: Secondary | ICD-10-CM | POA: Diagnosis not present

## 2015-12-14 DIAGNOSIS — E46 Unspecified protein-calorie malnutrition: Secondary | ICD-10-CM | POA: Diagnosis not present

## 2015-12-14 DIAGNOSIS — S32810D Multiple fractures of pelvis with stable disruption of pelvic ring, subsequent encounter for fracture with routine healing: Secondary | ICD-10-CM | POA: Diagnosis not present

## 2015-12-14 DIAGNOSIS — Z9181 History of falling: Secondary | ICD-10-CM | POA: Diagnosis not present

## 2015-12-14 DIAGNOSIS — F0390 Unspecified dementia without behavioral disturbance: Secondary | ICD-10-CM | POA: Diagnosis not present

## 2015-12-14 NOTE — Telephone Encounter (Signed)
Received voicemail from Katherine Flynn with [facility] saying he saw pt today and wanted to let Katherine Flynn know that pt fell on Sunday 4/2, no injury. They have OT and social worker seeing pt and pt is going to try and stay with her brother this evening.

## 2015-12-14 NOTE — Telephone Encounter (Signed)
FYI, see message. 

## 2015-12-17 DIAGNOSIS — G894 Chronic pain syndrome: Secondary | ICD-10-CM | POA: Diagnosis not present

## 2015-12-17 DIAGNOSIS — J449 Chronic obstructive pulmonary disease, unspecified: Secondary | ICD-10-CM | POA: Diagnosis not present

## 2015-12-17 DIAGNOSIS — F0390 Unspecified dementia without behavioral disturbance: Secondary | ICD-10-CM | POA: Diagnosis not present

## 2015-12-17 DIAGNOSIS — Z9181 History of falling: Secondary | ICD-10-CM | POA: Diagnosis not present

## 2015-12-17 DIAGNOSIS — E46 Unspecified protein-calorie malnutrition: Secondary | ICD-10-CM | POA: Diagnosis not present

## 2015-12-17 DIAGNOSIS — I10 Essential (primary) hypertension: Secondary | ICD-10-CM | POA: Diagnosis not present

## 2015-12-17 DIAGNOSIS — S32810D Multiple fractures of pelvis with stable disruption of pelvic ring, subsequent encounter for fracture with routine healing: Secondary | ICD-10-CM | POA: Diagnosis not present

## 2015-12-17 DIAGNOSIS — M6281 Muscle weakness (generalized): Secondary | ICD-10-CM | POA: Diagnosis not present

## 2015-12-18 DIAGNOSIS — J449 Chronic obstructive pulmonary disease, unspecified: Secondary | ICD-10-CM | POA: Diagnosis not present

## 2015-12-18 DIAGNOSIS — F0390 Unspecified dementia without behavioral disturbance: Secondary | ICD-10-CM | POA: Diagnosis not present

## 2015-12-18 DIAGNOSIS — M6281 Muscle weakness (generalized): Secondary | ICD-10-CM | POA: Diagnosis not present

## 2015-12-18 DIAGNOSIS — I10 Essential (primary) hypertension: Secondary | ICD-10-CM | POA: Diagnosis not present

## 2015-12-18 DIAGNOSIS — G894 Chronic pain syndrome: Secondary | ICD-10-CM | POA: Diagnosis not present

## 2015-12-18 DIAGNOSIS — S32810D Multiple fractures of pelvis with stable disruption of pelvic ring, subsequent encounter for fracture with routine healing: Secondary | ICD-10-CM | POA: Diagnosis not present

## 2015-12-18 DIAGNOSIS — E46 Unspecified protein-calorie malnutrition: Secondary | ICD-10-CM | POA: Diagnosis not present

## 2015-12-18 DIAGNOSIS — Z9181 History of falling: Secondary | ICD-10-CM | POA: Diagnosis not present

## 2015-12-20 DIAGNOSIS — Z9181 History of falling: Secondary | ICD-10-CM | POA: Diagnosis not present

## 2015-12-20 DIAGNOSIS — S32810D Multiple fractures of pelvis with stable disruption of pelvic ring, subsequent encounter for fracture with routine healing: Secondary | ICD-10-CM | POA: Diagnosis not present

## 2015-12-20 DIAGNOSIS — I10 Essential (primary) hypertension: Secondary | ICD-10-CM | POA: Diagnosis not present

## 2015-12-20 DIAGNOSIS — M6281 Muscle weakness (generalized): Secondary | ICD-10-CM | POA: Diagnosis not present

## 2015-12-20 DIAGNOSIS — J449 Chronic obstructive pulmonary disease, unspecified: Secondary | ICD-10-CM | POA: Diagnosis not present

## 2015-12-20 DIAGNOSIS — E46 Unspecified protein-calorie malnutrition: Secondary | ICD-10-CM | POA: Diagnosis not present

## 2015-12-20 DIAGNOSIS — F0390 Unspecified dementia without behavioral disturbance: Secondary | ICD-10-CM | POA: Diagnosis not present

## 2015-12-20 DIAGNOSIS — G894 Chronic pain syndrome: Secondary | ICD-10-CM | POA: Diagnosis not present

## 2015-12-21 ENCOUNTER — Ambulatory Visit: Payer: Commercial Managed Care - HMO | Admitting: Internal Medicine

## 2015-12-21 DIAGNOSIS — E46 Unspecified protein-calorie malnutrition: Secondary | ICD-10-CM | POA: Diagnosis not present

## 2015-12-21 DIAGNOSIS — I10 Essential (primary) hypertension: Secondary | ICD-10-CM | POA: Diagnosis not present

## 2015-12-21 DIAGNOSIS — Z9181 History of falling: Secondary | ICD-10-CM | POA: Diagnosis not present

## 2015-12-21 DIAGNOSIS — S32810D Multiple fractures of pelvis with stable disruption of pelvic ring, subsequent encounter for fracture with routine healing: Secondary | ICD-10-CM | POA: Diagnosis not present

## 2015-12-21 DIAGNOSIS — J449 Chronic obstructive pulmonary disease, unspecified: Secondary | ICD-10-CM | POA: Diagnosis not present

## 2015-12-21 DIAGNOSIS — F0390 Unspecified dementia without behavioral disturbance: Secondary | ICD-10-CM | POA: Diagnosis not present

## 2015-12-21 DIAGNOSIS — G894 Chronic pain syndrome: Secondary | ICD-10-CM | POA: Diagnosis not present

## 2015-12-21 DIAGNOSIS — M6281 Muscle weakness (generalized): Secondary | ICD-10-CM | POA: Diagnosis not present

## 2015-12-22 DIAGNOSIS — M6281 Muscle weakness (generalized): Secondary | ICD-10-CM | POA: Diagnosis not present

## 2015-12-22 DIAGNOSIS — E46 Unspecified protein-calorie malnutrition: Secondary | ICD-10-CM | POA: Diagnosis not present

## 2015-12-22 DIAGNOSIS — I10 Essential (primary) hypertension: Secondary | ICD-10-CM | POA: Diagnosis not present

## 2015-12-22 DIAGNOSIS — J449 Chronic obstructive pulmonary disease, unspecified: Secondary | ICD-10-CM | POA: Diagnosis not present

## 2015-12-22 DIAGNOSIS — G894 Chronic pain syndrome: Secondary | ICD-10-CM | POA: Diagnosis not present

## 2015-12-22 DIAGNOSIS — Z9181 History of falling: Secondary | ICD-10-CM | POA: Diagnosis not present

## 2015-12-22 DIAGNOSIS — S32810D Multiple fractures of pelvis with stable disruption of pelvic ring, subsequent encounter for fracture with routine healing: Secondary | ICD-10-CM | POA: Diagnosis not present

## 2015-12-22 DIAGNOSIS — F0390 Unspecified dementia without behavioral disturbance: Secondary | ICD-10-CM | POA: Diagnosis not present

## 2015-12-23 ENCOUNTER — Other Ambulatory Visit: Payer: Self-pay | Admitting: Internal Medicine

## 2015-12-23 DIAGNOSIS — E46 Unspecified protein-calorie malnutrition: Secondary | ICD-10-CM | POA: Diagnosis not present

## 2015-12-23 DIAGNOSIS — J449 Chronic obstructive pulmonary disease, unspecified: Secondary | ICD-10-CM | POA: Diagnosis not present

## 2015-12-23 DIAGNOSIS — S32810D Multiple fractures of pelvis with stable disruption of pelvic ring, subsequent encounter for fracture with routine healing: Secondary | ICD-10-CM | POA: Diagnosis not present

## 2015-12-23 DIAGNOSIS — I10 Essential (primary) hypertension: Secondary | ICD-10-CM | POA: Diagnosis not present

## 2015-12-23 DIAGNOSIS — M6281 Muscle weakness (generalized): Secondary | ICD-10-CM | POA: Diagnosis not present

## 2015-12-23 DIAGNOSIS — Z9181 History of falling: Secondary | ICD-10-CM | POA: Diagnosis not present

## 2015-12-23 DIAGNOSIS — G894 Chronic pain syndrome: Secondary | ICD-10-CM | POA: Diagnosis not present

## 2015-12-23 DIAGNOSIS — F0390 Unspecified dementia without behavioral disturbance: Secondary | ICD-10-CM | POA: Diagnosis not present

## 2015-12-24 DIAGNOSIS — S32810D Multiple fractures of pelvis with stable disruption of pelvic ring, subsequent encounter for fracture with routine healing: Secondary | ICD-10-CM | POA: Diagnosis not present

## 2015-12-24 DIAGNOSIS — M6281 Muscle weakness (generalized): Secondary | ICD-10-CM | POA: Diagnosis not present

## 2015-12-24 DIAGNOSIS — E46 Unspecified protein-calorie malnutrition: Secondary | ICD-10-CM | POA: Diagnosis not present

## 2015-12-24 DIAGNOSIS — F0390 Unspecified dementia without behavioral disturbance: Secondary | ICD-10-CM | POA: Diagnosis not present

## 2015-12-24 DIAGNOSIS — Z9181 History of falling: Secondary | ICD-10-CM | POA: Diagnosis not present

## 2015-12-24 DIAGNOSIS — J449 Chronic obstructive pulmonary disease, unspecified: Secondary | ICD-10-CM | POA: Diagnosis not present

## 2015-12-24 DIAGNOSIS — I10 Essential (primary) hypertension: Secondary | ICD-10-CM | POA: Diagnosis not present

## 2015-12-24 DIAGNOSIS — G894 Chronic pain syndrome: Secondary | ICD-10-CM | POA: Diagnosis not present

## 2015-12-27 DIAGNOSIS — S32810D Multiple fractures of pelvis with stable disruption of pelvic ring, subsequent encounter for fracture with routine healing: Secondary | ICD-10-CM | POA: Diagnosis not present

## 2015-12-27 DIAGNOSIS — I10 Essential (primary) hypertension: Secondary | ICD-10-CM | POA: Diagnosis not present

## 2015-12-27 DIAGNOSIS — M6281 Muscle weakness (generalized): Secondary | ICD-10-CM | POA: Diagnosis not present

## 2015-12-27 DIAGNOSIS — G894 Chronic pain syndrome: Secondary | ICD-10-CM | POA: Diagnosis not present

## 2015-12-27 DIAGNOSIS — J449 Chronic obstructive pulmonary disease, unspecified: Secondary | ICD-10-CM | POA: Diagnosis not present

## 2015-12-27 DIAGNOSIS — F0390 Unspecified dementia without behavioral disturbance: Secondary | ICD-10-CM | POA: Diagnosis not present

## 2015-12-27 DIAGNOSIS — E46 Unspecified protein-calorie malnutrition: Secondary | ICD-10-CM | POA: Diagnosis not present

## 2015-12-27 DIAGNOSIS — Z9181 History of falling: Secondary | ICD-10-CM | POA: Diagnosis not present

## 2015-12-28 DIAGNOSIS — Z9181 History of falling: Secondary | ICD-10-CM | POA: Diagnosis not present

## 2015-12-28 DIAGNOSIS — M6281 Muscle weakness (generalized): Secondary | ICD-10-CM | POA: Diagnosis not present

## 2015-12-28 DIAGNOSIS — F0390 Unspecified dementia without behavioral disturbance: Secondary | ICD-10-CM | POA: Diagnosis not present

## 2015-12-28 DIAGNOSIS — J449 Chronic obstructive pulmonary disease, unspecified: Secondary | ICD-10-CM | POA: Diagnosis not present

## 2015-12-28 DIAGNOSIS — S32810D Multiple fractures of pelvis with stable disruption of pelvic ring, subsequent encounter for fracture with routine healing: Secondary | ICD-10-CM | POA: Diagnosis not present

## 2015-12-28 DIAGNOSIS — I10 Essential (primary) hypertension: Secondary | ICD-10-CM | POA: Diagnosis not present

## 2015-12-28 DIAGNOSIS — G894 Chronic pain syndrome: Secondary | ICD-10-CM | POA: Diagnosis not present

## 2015-12-28 DIAGNOSIS — E46 Unspecified protein-calorie malnutrition: Secondary | ICD-10-CM | POA: Diagnosis not present

## 2015-12-29 DIAGNOSIS — G894 Chronic pain syndrome: Secondary | ICD-10-CM | POA: Diagnosis not present

## 2015-12-29 DIAGNOSIS — I10 Essential (primary) hypertension: Secondary | ICD-10-CM | POA: Diagnosis not present

## 2015-12-29 DIAGNOSIS — Z9181 History of falling: Secondary | ICD-10-CM | POA: Diagnosis not present

## 2015-12-29 DIAGNOSIS — S32810D Multiple fractures of pelvis with stable disruption of pelvic ring, subsequent encounter for fracture with routine healing: Secondary | ICD-10-CM | POA: Diagnosis not present

## 2015-12-29 DIAGNOSIS — J449 Chronic obstructive pulmonary disease, unspecified: Secondary | ICD-10-CM | POA: Diagnosis not present

## 2015-12-29 DIAGNOSIS — F0390 Unspecified dementia without behavioral disturbance: Secondary | ICD-10-CM | POA: Diagnosis not present

## 2015-12-29 DIAGNOSIS — M6281 Muscle weakness (generalized): Secondary | ICD-10-CM | POA: Diagnosis not present

## 2015-12-29 DIAGNOSIS — E46 Unspecified protein-calorie malnutrition: Secondary | ICD-10-CM | POA: Diagnosis not present

## 2015-12-30 DIAGNOSIS — F0390 Unspecified dementia without behavioral disturbance: Secondary | ICD-10-CM | POA: Diagnosis not present

## 2015-12-30 DIAGNOSIS — I10 Essential (primary) hypertension: Secondary | ICD-10-CM | POA: Diagnosis not present

## 2015-12-30 DIAGNOSIS — E46 Unspecified protein-calorie malnutrition: Secondary | ICD-10-CM | POA: Diagnosis not present

## 2015-12-30 DIAGNOSIS — M6281 Muscle weakness (generalized): Secondary | ICD-10-CM | POA: Diagnosis not present

## 2015-12-30 DIAGNOSIS — J449 Chronic obstructive pulmonary disease, unspecified: Secondary | ICD-10-CM | POA: Diagnosis not present

## 2015-12-30 DIAGNOSIS — S32810D Multiple fractures of pelvis with stable disruption of pelvic ring, subsequent encounter for fracture with routine healing: Secondary | ICD-10-CM | POA: Diagnosis not present

## 2015-12-30 DIAGNOSIS — G894 Chronic pain syndrome: Secondary | ICD-10-CM | POA: Diagnosis not present

## 2015-12-30 DIAGNOSIS — Z9181 History of falling: Secondary | ICD-10-CM | POA: Diagnosis not present

## 2016-01-04 DIAGNOSIS — Z9181 History of falling: Secondary | ICD-10-CM | POA: Diagnosis not present

## 2016-01-04 DIAGNOSIS — G894 Chronic pain syndrome: Secondary | ICD-10-CM | POA: Diagnosis not present

## 2016-01-04 DIAGNOSIS — M6281 Muscle weakness (generalized): Secondary | ICD-10-CM | POA: Diagnosis not present

## 2016-01-04 DIAGNOSIS — S32810D Multiple fractures of pelvis with stable disruption of pelvic ring, subsequent encounter for fracture with routine healing: Secondary | ICD-10-CM | POA: Diagnosis not present

## 2016-01-04 DIAGNOSIS — E46 Unspecified protein-calorie malnutrition: Secondary | ICD-10-CM | POA: Diagnosis not present

## 2016-01-04 DIAGNOSIS — J449 Chronic obstructive pulmonary disease, unspecified: Secondary | ICD-10-CM | POA: Diagnosis not present

## 2016-01-04 DIAGNOSIS — F0390 Unspecified dementia without behavioral disturbance: Secondary | ICD-10-CM | POA: Diagnosis not present

## 2016-01-04 DIAGNOSIS — I10 Essential (primary) hypertension: Secondary | ICD-10-CM | POA: Diagnosis not present

## 2016-01-05 DIAGNOSIS — M6281 Muscle weakness (generalized): Secondary | ICD-10-CM | POA: Diagnosis not present

## 2016-01-05 DIAGNOSIS — G894 Chronic pain syndrome: Secondary | ICD-10-CM | POA: Diagnosis not present

## 2016-01-05 DIAGNOSIS — F0390 Unspecified dementia without behavioral disturbance: Secondary | ICD-10-CM | POA: Diagnosis not present

## 2016-01-05 DIAGNOSIS — J449 Chronic obstructive pulmonary disease, unspecified: Secondary | ICD-10-CM | POA: Diagnosis not present

## 2016-01-05 DIAGNOSIS — S32810D Multiple fractures of pelvis with stable disruption of pelvic ring, subsequent encounter for fracture with routine healing: Secondary | ICD-10-CM | POA: Diagnosis not present

## 2016-01-05 DIAGNOSIS — E46 Unspecified protein-calorie malnutrition: Secondary | ICD-10-CM | POA: Diagnosis not present

## 2016-01-05 DIAGNOSIS — I10 Essential (primary) hypertension: Secondary | ICD-10-CM | POA: Diagnosis not present

## 2016-01-05 DIAGNOSIS — Z9181 History of falling: Secondary | ICD-10-CM | POA: Diagnosis not present

## 2016-01-12 ENCOUNTER — Telehealth: Payer: Self-pay | Admitting: Internal Medicine

## 2016-01-12 MED ORDER — LISINOPRIL 20 MG PO TABS: 20.0000 mg | ORAL_TABLET | Freq: Every day | ORAL | Status: DC

## 2016-01-12 MED ORDER — METOPROLOL TARTRATE 25 MG PO TABS: 25.0000 mg | ORAL_TABLET | Freq: Two times a day (BID) | ORAL | Status: DC

## 2016-01-12 MED ORDER — LEVOTHYROXINE SODIUM 75 MCG PO TABS: 75.0000 ug | ORAL_TABLET | Freq: Every day | ORAL | Status: DC

## 2016-01-12 NOTE — Telephone Encounter (Signed)
Dr.K, pt requesting refill on Depakote 125 mg I do not see it on her medication list. Also requesting refill on Oxycodone. Please advise.

## 2016-01-12 NOTE — Telephone Encounter (Signed)
I did not prescribe these medications-no refills Needs   Return office visit

## 2016-01-12 NOTE — Telephone Encounter (Signed)
Pt need new Rx for Depakote 125 mg, levothyroxine 75 mg, lisinopril 20 mg, lopressor 25 mg and oxycodone w/acetaminophen 5mg / 325 mg.  [facility]

## 2016-01-12 NOTE — Telephone Encounter (Signed)
Spoke to pt's daughter Cordelia PenSherry, told her Dr. Kirtland BouchardK  can not refill Depakote and Oxycodone due to he did not prescribe them and pt needs follow up office visit. Cordelia PenSherry verbalized understanding and said she is concerned about the Depakote because pt takes it twice a day and pt will be out. Told Cordelia PenSherry pt can come in tomorrow at 8:45 to see Dr.K. t discuss medications. Cordelia PenSherry said okay, appt scheduled.

## 2016-01-13 ENCOUNTER — Encounter: Payer: Self-pay | Admitting: Internal Medicine

## 2016-01-13 ENCOUNTER — Ambulatory Visit (INDEPENDENT_AMBULATORY_CARE_PROVIDER_SITE_OTHER): Payer: Commercial Managed Care - HMO | Admitting: Internal Medicine

## 2016-01-13 VITALS — BP 110/70 | HR 77 | Temp 97.8°F | Resp 18 | Ht 59.0 in | Wt 92.0 lb

## 2016-01-13 DIAGNOSIS — S32810D Multiple fractures of pelvis with stable disruption of pelvic ring, subsequent encounter for fracture with routine healing: Secondary | ICD-10-CM

## 2016-01-13 DIAGNOSIS — G894 Chronic pain syndrome: Secondary | ICD-10-CM | POA: Diagnosis not present

## 2016-01-13 DIAGNOSIS — I10 Essential (primary) hypertension: Secondary | ICD-10-CM | POA: Diagnosis not present

## 2016-01-13 DIAGNOSIS — E039 Hypothyroidism, unspecified: Secondary | ICD-10-CM

## 2016-01-13 DIAGNOSIS — F0390 Unspecified dementia without behavioral disturbance: Secondary | ICD-10-CM | POA: Diagnosis not present

## 2016-01-13 DIAGNOSIS — M150 Primary generalized (osteo)arthritis: Secondary | ICD-10-CM | POA: Diagnosis not present

## 2016-01-13 DIAGNOSIS — Z9181 History of falling: Secondary | ICD-10-CM | POA: Diagnosis not present

## 2016-01-13 DIAGNOSIS — E46 Unspecified protein-calorie malnutrition: Secondary | ICD-10-CM | POA: Diagnosis not present

## 2016-01-13 DIAGNOSIS — J449 Chronic obstructive pulmonary disease, unspecified: Secondary | ICD-10-CM | POA: Diagnosis not present

## 2016-01-13 DIAGNOSIS — M159 Polyosteoarthritis, unspecified: Secondary | ICD-10-CM

## 2016-01-13 DIAGNOSIS — M6281 Muscle weakness (generalized): Secondary | ICD-10-CM | POA: Diagnosis not present

## 2016-01-13 LAB — COMPREHENSIVE METABOLIC PANEL
ALK PHOS: 90 U/L (ref 39–117)
ALT: 11 U/L (ref 0–35)
AST: 17 U/L (ref 0–37)
Albumin: 3.9 g/dL (ref 3.5–5.2)
BILIRUBIN TOTAL: 0.4 mg/dL (ref 0.2–1.2)
BUN: 20 mg/dL (ref 6–23)
CO2: 30 mEq/L (ref 19–32)
CREATININE: 0.74 mg/dL (ref 0.40–1.20)
Calcium: 9.8 mg/dL (ref 8.4–10.5)
Chloride: 104 mEq/L (ref 96–112)
GFR: 78.28 mL/min (ref >60.00)
GLUCOSE: 90 mg/dL (ref 70–99)
Potassium: 4 mEq/L (ref 3.5–5.1)
SODIUM: 142 meq/L (ref 135–145)
TOTAL PROTEIN: 6.4 g/dL (ref 6.0–8.3)

## 2016-01-13 LAB — CBC WITH DIFFERENTIAL/PLATELET
BASOS ABS: 0.1 10*3/uL (ref 0.0–0.1)
Basophils Relative: 1.5 % (ref 0.0–3.0)
Eosinophils Absolute: 0.4 10*3/uL (ref 0.0–0.7)
Eosinophils Relative: 4.8 % (ref 0.0–5.0)
HCT: 39 % (ref 36.0–46.0)
Hemoglobin: 12.8 g/dL (ref 12.0–15.0)
LYMPHS ABS: 1.6 10*3/uL (ref 0.7–4.0)
Lymphocytes Relative: 21.4 % (ref 12.0–46.0)
MCHC: 32.9 g/dL (ref 30.0–36.0)
MCV: 90.2 fl (ref 78.0–100.0)
Monocytes Absolute: 0.4 10*3/uL (ref 0.1–1.0)
Monocytes Relative: 5.2 % (ref 3.0–12.0)
NEUTROS PCT: 67.1 % (ref 43.0–77.0)
Neutro Abs: 4.9 10*3/uL (ref 1.4–7.7)
Platelets: 346 10*3/uL (ref 150.0–400.0)
RBC: 4.32 Mil/uL (ref 3.87–5.11)
RDW: 15.8 % — ABNORMAL HIGH (ref 11.5–15.5)
WBC: 7.3 10*3/uL (ref 4.0–10.5)

## 2016-01-13 LAB — TSH: TSH: 23.9 u[IU]/mL — AB (ref 0.35–4.50)

## 2016-01-13 NOTE — Patient Instructions (Signed)
Discontinue lisinopril  EAT EAT EAT!!!  Return in 3 months for follow-up

## 2016-01-13 NOTE — Progress Notes (Signed)
Pre visit review using our clinic review tool, if applicable. No additional management support is needed unless otherwise documented below in the visit note. 

## 2016-01-13 NOTE — Progress Notes (Signed)
Subjective:    Patient ID: Katherine Flynn, female    DOB: Jul 16, 1923, 82 y.o.   MRN: [id number]  HPI Admit date: 11/04/2015 Discharge date: 11/08/2015   Recommendations for Outpatient Follow-up:  1. PCP in 1 week 2. If has ongoing failure to thrive consider Hospice involvement   Discharge Diagnoses:  Principal Problem:  Multiple pelvic fractures (HCC) Active Problems:  Hypothyroidism  Essential hypertension  Iron deficiency anemia  Protein calorie malnutrition (HCC)  Fall at home  Prolonged Q-T interval on ECG  RBBB  Dilated bile duct  Pancreatic duct dilated (HCC)  Abnormal CT of the abdomen   Wt Readings from Last 3 Encounters:  01/13/16 92 lb (41.731 kg)  11/05/15 104 lb 4.8 oz (47.31 kg)  06/14/15 96 lb (43.36 kg)   82 year old patient who is hospitalized in February after a pelvic fracture.  She was transferred to collapse and now is at home living independently.  She does have some home health assistance in the morning and evening.  She does have family that lives locally. Weight has been as low as 89 pounds She generally feels well today.  No concerns or complaints  Hospital records reviewed.  She did have an abnormal abdominal CT.  Will repeat LFTs today She has essential hypertension and blood pressure is low normal today Thyroid supplementation was increased during her hospital stay due to an elevated TSH.  Unclear whether the patient was taken her supplement regularly  Past Medical History  Diagnosis Date  . GERD (gastroesophageal reflux disease)   . Hypertension   . Thyroid disease   . COPD (chronic obstructive pulmonary disease) (HCC)   . Osteoarthritis   . Weight loss   . History of pyloric stenosis   . Chronic pain syndrome   . Macular degeneration      Social History   Social History  . Marital Status: Widowed    Spouse Name: N/A  . Number of Children: N/A  . Years of Education: N/A   Occupational History  . Not on  file.   Social History Main Topics  . Smoking status: Never Smoker   . Smokeless tobacco: Never Used  . Alcohol Use: No  . Drug Use: No  . Sexual Activity: Not on file   Other Topics Concern  . Not on file   Social History Narrative    Past Surgical History  Procedure Laterality Date  . Appendectomy    . Cataract extraction    . Orif hip fracture    . Tubal ligation    . [employer] fundoplication  08/2007    No family history on file.  No Known Allergies  Current Outpatient Prescriptions on File Prior to Visit  Medication Sig Dispense Refill  . levothyroxine (SYNTHROID, LEVOTHROID) 75 MCG tablet Take 1 tablet (75 mcg total) by mouth daily. 90 tablet 0  . lisinopril (PRINIVIL,ZESTRIL) 20 MG tablet Take 1 tablet (20 mg total) by mouth daily. 90 tablet 0  . metoprolol tartrate (LOPRESSOR) 25 MG tablet Take 1 tablet (25 mg total) by mouth 2 (two) times daily. 180 tablet 0  . Multiple Vitamin (MULTIVITAMIN WITH MINERALS) TABS tablet Take 1 tablet by mouth daily.    Marland Kitchen oxyCODONE (OXY IR/ROXICODONE) 5 MG immediate release tablet Take 1 tablet (5 mg total) by mouth every 4 (four) hours as needed for moderate pain. 30 tablet 0   No current facility-administered medications on file prior to visit.    BP 110/70 mmHg  Pulse 77  Temp(Src) 97.8 F (36.6 C) (Oral)  Resp 18  Ht 4\' 11"  (1.499 m)  Wt 92 lb (41.731 kg)  BMI 18.57 kg/m2  SpO2 97%    Review of Systems  Constitutional: Negative.   HENT: Negative for congestion, dental problem, hearing loss, rhinorrhea, sinus pressure, sore throat and tinnitus.   Eyes: Negative for pain, discharge and visual disturbance.  Respiratory: Negative for cough and shortness of breath.   Cardiovascular: Negative for chest pain, palpitations and leg swelling.  Gastrointestinal: Negative for nausea, vomiting, abdominal pain, diarrhea, constipation, blood in stool and abdominal distention.  Genitourinary: Negative for dysuria, urgency, frequency,  hematuria, flank pain, vaginal bleeding, vaginal discharge, difficulty urinating, vaginal pain and pelvic pain.  Musculoskeletal: Positive for gait problem. Negative for joint swelling and arthralgias.  Skin: Negative for rash.  Neurological: Negative for dizziness, syncope, speech difficulty, weakness, numbness and headaches.  Hematological: Negative for adenopathy.  Psychiatric/Behavioral: Negative for behavioral problems, dysphoric mood and agitation. The patient is not nervous/anxious.        Objective:   Physical Exam  Constitutional: She is oriented to person, place, and time. She appears well-developed and well-nourished.  Blood pressure 110/70 Weight 92  HENT:  Head: Normocephalic.  Right Ear: External ear normal.  Left Ear: External ear normal.  Mouth/Throat: Oropharynx is clear and moist.  Eyes: Conjunctivae and EOM are normal. Pupils are equal, round, and reactive to light.  Neck: Normal range of motion. Neck supple. No thyromegaly present.  Cardiovascular: Normal rate, regular rhythm and normal heart sounds.   Pulmonary/Chest: Effort normal and breath sounds normal.  Abdominal: Soft. Bowel sounds are normal. She exhibits no mass. There is no tenderness.  Musculoskeletal: Normal range of motion.  Lymphadenopathy:    She has no cervical adenopathy.  Neurological: She is alert and oriented to person, place, and time.  Skin: Skin is warm and dry. No rash noted.  Psychiatric: She has a normal mood and affect. Her behavior is normal.          Assessment & Plan:   Hypertension.  Will discontinue lisinopril Weight loss. History of abnormal abdominal CT.  Patient clinically is doing well.  We'll check LFTs and if these are normal.  Continue to observe Hypothyroidism.  We'll check TSH Unsteady gait  Recheck 3 months

## 2016-01-14 ENCOUNTER — Other Ambulatory Visit: Payer: Self-pay | Admitting: *Deleted

## 2016-01-14 DIAGNOSIS — E039 Hypothyroidism, unspecified: Secondary | ICD-10-CM

## 2016-01-14 MED ORDER — LEVOTHYROXINE SODIUM 100 MCG PO TABS: 100.0000 ug | ORAL_TABLET | Freq: Every day | ORAL | Status: DC

## 2016-01-20 DIAGNOSIS — S32810D Multiple fractures of pelvis with stable disruption of pelvic ring, subsequent encounter for fracture with routine healing: Secondary | ICD-10-CM | POA: Diagnosis not present

## 2016-01-20 DIAGNOSIS — E46 Unspecified protein-calorie malnutrition: Secondary | ICD-10-CM | POA: Diagnosis not present

## 2016-01-20 DIAGNOSIS — M6281 Muscle weakness (generalized): Secondary | ICD-10-CM | POA: Diagnosis not present

## 2016-01-20 DIAGNOSIS — F0390 Unspecified dementia without behavioral disturbance: Secondary | ICD-10-CM | POA: Diagnosis not present

## 2016-01-20 DIAGNOSIS — I10 Essential (primary) hypertension: Secondary | ICD-10-CM | POA: Diagnosis not present

## 2016-01-20 DIAGNOSIS — Z9181 History of falling: Secondary | ICD-10-CM | POA: Diagnosis not present

## 2016-01-20 DIAGNOSIS — J449 Chronic obstructive pulmonary disease, unspecified: Secondary | ICD-10-CM | POA: Diagnosis not present

## 2016-01-20 DIAGNOSIS — G894 Chronic pain syndrome: Secondary | ICD-10-CM | POA: Diagnosis not present

## 2016-04-06 ENCOUNTER — Other Ambulatory Visit: Payer: Self-pay | Admitting: Internal Medicine

## 2016-04-28 ENCOUNTER — Encounter: Payer: Self-pay | Admitting: Internal Medicine

## 2016-04-28 ENCOUNTER — Ambulatory Visit (INDEPENDENT_AMBULATORY_CARE_PROVIDER_SITE_OTHER): Payer: Commercial Managed Care - HMO | Admitting: Internal Medicine

## 2016-04-28 VITALS — BP 140/70 | HR 83 | Temp 97.9°F | Ht 59.0 in | Wt 93.0 lb

## 2016-04-28 DIAGNOSIS — I10 Essential (primary) hypertension: Secondary | ICD-10-CM | POA: Diagnosis not present

## 2016-04-28 DIAGNOSIS — E038 Other specified hypothyroidism: Secondary | ICD-10-CM | POA: Diagnosis not present

## 2016-04-28 DIAGNOSIS — M150 Primary generalized (osteo)arthritis: Secondary | ICD-10-CM

## 2016-04-28 DIAGNOSIS — M159 Polyosteoarthritis, unspecified: Secondary | ICD-10-CM

## 2016-04-28 LAB — TSH: TSH: 7.7 mIU/L — ABNORMAL HIGH

## 2016-04-28 MED ORDER — LEVOTHYROXINE SODIUM 100 MCG PO TABS: 100.0000 ug | ORAL_TABLET | Freq: Every day | ORAL | 4 refills | Status: DC

## 2016-04-28 MED ORDER — METOPROLOL TARTRATE 25 MG PO TABS: 25.0000 mg | ORAL_TABLET | Freq: Two times a day (BID) | ORAL | 4 refills | Status: AC

## 2016-04-28 NOTE — Patient Instructions (Signed)
Limit your sodium (Salt) intake  Please check your blood pressure on a regular basis.  If it is consistently greater than 150/90, please make an office appointment.  Return in 6 months for follow-up   

## 2016-04-28 NOTE — Progress Notes (Signed)
Subjective:    Patient ID: Katherine Flynn, female    DOB: 02/20/1923, 82 y.o.   MRN: [id number]  HPI  82 year old patient who has essential hypertension.  She has recovered nicely from a pelvic fracture 6 months ago.  She has hypothyroidism and TSH was elevated.  3 months ago.  She has been compliant with her medications. She feels quite well today Accompanied by son  Past Medical History:  Diagnosis Date  . Chronic pain syndrome   . COPD (chronic obstructive pulmonary disease) (HCC)   . GERD (gastroesophageal reflux disease)   . History of pyloric stenosis   . Hypertension   . Macular degeneration   . Osteoarthritis   . Thyroid disease   . Weight loss      Social History   Social History  . Marital status: Widowed    Spouse name: N/A  . Number of children: N/A  . Years of education: N/A   Occupational History  . Not on file.   Social History Main Topics  . Smoking status: Never Smoker  . Smokeless tobacco: Never Used  . Alcohol use No  . Drug use: No  . Sexual activity: Not on file   Other Topics Concern  . Not on file   Social History Narrative  . No narrative on file    Past Surgical History:  Procedure Laterality Date  . APPENDECTOMY    . CATARACT EXTRACTION    . nissan fundoplication  08/2007  . ORIF HIP FRACTURE    . TUBAL LIGATION      No family history on file.  No Known Allergies  Current Outpatient Prescriptions on File Prior to Visit  Medication Sig Dispense Refill  . Multiple Vitamin (MULTIVITAMIN WITH MINERALS) TABS tablet Take 1 tablet by mouth daily.     No current facility-administered medications on file prior to visit.     BP 140/70 (BP Location: Left Arm, Patient Position: Sitting, Cuff Size: Normal)   Pulse 83   Temp 97.9 F (36.6 C) (Oral)   Ht 4\' 11"  (1.499 m)   Wt 93 lb (42.2 kg)   SpO2 94%   BMI 18.78 kg/m     Review of Systems  Constitutional: Negative.   HENT: Negative for congestion, dental problem,  hearing loss, rhinorrhea, sinus pressure, sore throat and tinnitus.   Eyes: Negative for pain, discharge and visual disturbance.  Respiratory: Negative for cough and shortness of breath.   Cardiovascular: Negative for chest pain, palpitations and leg swelling.  Gastrointestinal: Negative for abdominal distention, abdominal pain, blood in stool, constipation, diarrhea, nausea and vomiting.  Genitourinary: Negative for difficulty urinating, dysuria, flank pain, frequency, hematuria, pelvic pain, urgency, vaginal bleeding, vaginal discharge and vaginal pain.  Musculoskeletal: Positive for arthralgias, back pain and gait problem. Negative for joint swelling.  Skin: Negative for rash.  Neurological: Negative for dizziness, syncope, speech difficulty, weakness, numbness and headaches.  Hematological: Negative for adenopathy.  Psychiatric/Behavioral: Negative for agitation, behavioral problems and dysphoric mood. The patient is not nervous/anxious.        Objective:   Physical Exam  Constitutional: She is oriented to person, place, and time. She appears well-developed and well-nourished. No distress.  Alert and appropriate.  No distress  HENT:  Head: Normocephalic.  Right Ear: External ear normal.  Left Ear: External ear normal.  Mouth/Throat: Oropharynx is clear and moist.  Eyes: Conjunctivae and EOM are normal. Pupils are equal, round, and reactive to light.  Neck: Normal range of motion.  Neck supple. No thyromegaly present.  Cardiovascular: Normal rate, regular rhythm, normal heart sounds and intact distal pulses.   Pulmonary/Chest: Effort normal and breath sounds normal.  Abdominal: Soft. Bowel sounds are normal. She exhibits no mass. There is no tenderness.  Musculoskeletal: Normal range of motion. She exhibits no edema.  Lymphadenopathy:    She has no cervical adenopathy.  Neurological: She is alert and oriented to person, place, and time.  Skin: Skin is warm and dry. No rash noted.    Psychiatric: She has a normal mood and affect. Her behavior is normal.          Assessment & Plan:   Essential hypertension.  Well-controlled.  No change in therapy Hypothyroidism.  We'll check a TSH.  Might need  adjustment of her medication  Status post pelvic fracture  Recheck 6 months or as needed  Rogelia BogaKWIATKOWSKI,PETER FRANK, MD

## 2016-05-01 ENCOUNTER — Telehealth: Payer: Self-pay | Admitting: *Deleted

## 2016-05-01 ENCOUNTER — Encounter (HOSPITAL_COMMUNITY): Payer: Self-pay | Admitting: *Deleted

## 2016-05-01 ENCOUNTER — Emergency Department (HOSPITAL_COMMUNITY): Payer: Commercial Managed Care - HMO

## 2016-05-01 ENCOUNTER — Observation Stay (HOSPITAL_COMMUNITY)
Admission: EM | Admit: 2016-05-01 | Discharge: 2016-05-03 | Disposition: A | Discharge status: Skilled Nursing Facility | Payer: Commercial Managed Care - HMO | Attending: Family Medicine | Admitting: Family Medicine

## 2016-05-01 DIAGNOSIS — J449 Chronic obstructive pulmonary disease, unspecified: Secondary | ICD-10-CM | POA: Diagnosis not present

## 2016-05-01 DIAGNOSIS — E039 Hypothyroidism, unspecified: Secondary | ICD-10-CM | POA: Insufficient documentation

## 2016-05-01 DIAGNOSIS — E860 Dehydration: Secondary | ICD-10-CM | POA: Diagnosis present

## 2016-05-01 DIAGNOSIS — B9620 Unspecified Escherichia coli [E. coli] as the cause of diseases classified elsewhere: Secondary | ICD-10-CM | POA: Diagnosis not present

## 2016-05-01 DIAGNOSIS — R112 Nausea with vomiting, unspecified: Secondary | ICD-10-CM | POA: Diagnosis present

## 2016-05-01 DIAGNOSIS — Z79899 Other long term (current) drug therapy: Secondary | ICD-10-CM | POA: Diagnosis not present

## 2016-05-01 DIAGNOSIS — E876 Hypokalemia: Secondary | ICD-10-CM | POA: Insufficient documentation

## 2016-05-01 DIAGNOSIS — I10 Essential (primary) hypertension: Secondary | ICD-10-CM | POA: Diagnosis not present

## 2016-05-01 DIAGNOSIS — E43 Unspecified severe protein-calorie malnutrition: Secondary | ICD-10-CM | POA: Diagnosis not present

## 2016-05-01 DIAGNOSIS — Z681 Body mass index (BMI) 19 or less, adult: Secondary | ICD-10-CM | POA: Insufficient documentation

## 2016-05-01 DIAGNOSIS — Z66 Do not resuscitate: Secondary | ICD-10-CM | POA: Insufficient documentation

## 2016-05-01 DIAGNOSIS — N390 Urinary tract infection, site not specified: Principal | ICD-10-CM | POA: Insufficient documentation

## 2016-05-01 DIAGNOSIS — R110 Nausea: Secondary | ICD-10-CM | POA: Diagnosis not present

## 2016-05-01 LAB — LIPASE, BLOOD: LIPASE: 32 U/L (ref 11–51)

## 2016-05-01 LAB — COMPREHENSIVE METABOLIC PANEL
ALBUMIN: 3.9 g/dL (ref 3.5–5.0)
ALT: 14 U/L (ref 14–54)
ANION GAP: 10 (ref 5–15)
AST: 22 U/L (ref 15–41)
Alkaline Phosphatase: 59 U/L (ref 38–126)
BUN: 11 mg/dL (ref 6–20)
CHLORIDE: 102 mmol/L (ref 101–111)
CO2: 26 mmol/L (ref 22–32)
Calcium: 9.7 mg/dL (ref 8.9–10.3)
Creatinine, Ser: 0.75 mg/dL (ref 0.44–1.00)
GFR calc non Af Amer: >60 mL/min (ref >60)
Glucose, Bld: 170 mg/dL — ABNORMAL HIGH (ref 65–99)
Potassium: 3.5 mmol/L (ref 3.5–5.1)
SODIUM: 138 mmol/L (ref 135–145)
Total Bilirubin: 0.5 mg/dL (ref 0.3–1.2)
Total Protein: 6.9 g/dL (ref 6.5–8.1)

## 2016-05-01 LAB — URINE MICROSCOPIC-ADD ON

## 2016-05-01 LAB — CBC
HCT: 38.1 % (ref 36.0–46.0)
HEMOGLOBIN: 12 g/dL (ref 12.0–15.0)
MCH: 28.9 pg (ref 26.0–34.0)
MCHC: 31.5 g/dL (ref 30.0–36.0)
MCV: 91.8 fL (ref 78.0–100.0)
Platelets: 342 10*3/uL (ref 150–400)
RBC: 4.15 MIL/uL (ref 3.87–5.11)
RDW: 12.8 % (ref 11.5–15.5)
WBC: 5.4 10*3/uL (ref 4.0–10.5)

## 2016-05-01 LAB — URINALYSIS, ROUTINE W REFLEX MICROSCOPIC
Bilirubin Urine: NEGATIVE
GLUCOSE, UA: NEGATIVE mg/dL
Ketones, ur: 15 mg/dL — AB
Nitrite: POSITIVE — AB
PH: 5.5 (ref 5.0–8.0)
Protein, ur: 30 mg/dL — AB
SPECIFIC GRAVITY, URINE: 1.021 (ref 1.005–1.030)

## 2016-05-01 LAB — TSH: TSH: 3.995 u[IU]/mL (ref 0.350–4.500)

## 2016-05-01 MED ORDER — ONDANSETRON HCL 4 MG/2ML IJ SOLN
4.0000 mg | Freq: Four times a day (QID) | INTRAMUSCULAR | Status: DC | PRN
  Administered 2016-05-01: 4 mg via INTRAVENOUS
  Filled 2016-05-01: qty 2

## 2016-05-01 MED ORDER — ALBUTEROL SULFATE (2.5 MG/3ML) 0.083% IN NEBU: 2.5000 mg | INHALATION_SOLUTION | RESPIRATORY_TRACT | Status: DC | PRN

## 2016-05-01 MED ORDER — LEVOTHYROXINE SODIUM 100 MCG PO TABS
100.0000 ug | ORAL_TABLET | Freq: Every day | ORAL | Status: DC
  Administered 2016-05-02 – 2016-05-03 (×2): 100 ug via ORAL
  Filled 2016-05-01 (×2): qty 1

## 2016-05-01 MED ORDER — ONDANSETRON HCL 4 MG PO TABS: 4.0000 mg | ORAL_TABLET | Freq: Four times a day (QID) | ORAL | Status: DC | PRN

## 2016-05-01 MED ORDER — HEPARIN SODIUM (PORCINE) 5000 UNIT/ML IJ SOLN
5000.0000 [IU] | Freq: Three times a day (TID) | INTRAMUSCULAR | Status: DC
  Administered 2016-05-01 – 2016-05-03 (×6): 5000 [IU] via SUBCUTANEOUS
  Filled 2016-05-01 (×6): qty 1

## 2016-05-01 MED ORDER — SODIUM CHLORIDE 0.9 % IV SOLN: INTRAVENOUS | Status: DC

## 2016-05-01 MED ORDER — BISACODYL 5 MG PO TBEC: 5.0000 mg | DELAYED_RELEASE_TABLET | Freq: Every day | ORAL | Status: DC | PRN

## 2016-05-01 MED ORDER — SODIUM CHLORIDE 0.9 % IV BOLUS (SEPSIS)
1000.0000 mL | Freq: Once | INTRAVENOUS | Status: AC
  Administered 2016-05-01: 1000 mL via INTRAVENOUS

## 2016-05-01 MED ORDER — METOPROLOL TARTRATE 25 MG PO TABS
25.0000 mg | ORAL_TABLET | Freq: Two times a day (BID) | ORAL | Status: DC
  Administered 2016-05-01 – 2016-05-03 (×4): 25 mg via ORAL
  Filled 2016-05-01 (×4): qty 1

## 2016-05-01 MED ORDER — ZOLPIDEM TARTRATE 5 MG PO TABS
2.5000 mg | ORAL_TABLET | Freq: Once | ORAL | Status: AC
  Administered 2016-05-01: 2.5 mg via ORAL
  Filled 2016-05-01: qty 1

## 2016-05-01 MED ORDER — ADULT MULTIVITAMIN W/MINERALS CH
1.0000 | ORAL_TABLET | Freq: Every day | ORAL | Status: DC
  Administered 2016-05-02 – 2016-05-03 (×2): 1 via ORAL
  Filled 2016-05-01 (×2): qty 1

## 2016-05-01 MED ORDER — SODIUM CHLORIDE 0.9 % IV SOLN
INTRAVENOUS | Status: AC
  Administered 2016-05-01 (×2): via INTRAVENOUS
  Administered 2016-05-02: 1000 mL via INTRAVENOUS

## 2016-05-01 MED ORDER — DEXTROSE 5 % IV SOLN
1.0000 g | INTRAVENOUS | Status: DC
  Administered 2016-05-01 – 2016-05-02 (×2): 1 g via INTRAVENOUS
  Filled 2016-05-01 (×3): qty 10

## 2016-05-01 MED ORDER — ONDANSETRON 4 MG PO TBDP
4.0000 mg | ORAL_TABLET | Freq: Once | ORAL | Status: AC | PRN
  Administered 2016-05-01: 4 mg via ORAL

## 2016-05-01 MED ORDER — HYDROCODONE-ACETAMINOPHEN 5-325 MG PO TABS
1.0000 | ORAL_TABLET | Freq: Four times a day (QID) | ORAL | Status: DC | PRN
  Administered 2016-05-01 – 2016-05-03 (×4): 1 via ORAL
  Filled 2016-05-01 (×4): qty 1

## 2016-05-01 MED ORDER — ONDANSETRON 4 MG PO TBDP
ORAL_TABLET | ORAL | Status: AC
  Filled 2016-05-01: qty 1

## 2016-05-01 NOTE — ED Provider Notes (Signed)
[facility]-EMERGENCY DEPT Provider Note   CSN: [id number] Arrival date & time: 05/01/16  1145  History   Chief Complaint Chief Complaint  Patient presents with  . Abdominal Pain  . Emesis    HPI Katherine Flynn is a 82 y.o. female.  HPI  Patient presents with nausea and vomiting. History provided by patient and her son.   Patient reports nausea beginning about 24 hours ago. Reports three episodes of emesis since then as well. Also reporting generalized abdominal pain that she attributes to vomiting. She began to feel weak after episodes of vomiting, so a sitter stayed with her last night. Her symptoms did not improve and she continued to feel weak, so her sitter thought she should come to the ED today. Patient denies any fevers. Last BM was last night. Last ate yesterday afternoon, and had a glass of milk last night. Has not had anything to eat or drink today. Son is concerned because she keeps her house very warm and does not drink very much regularly.   Past Medical History:  Diagnosis Date  . Chronic pain syndrome   . COPD (chronic obstructive pulmonary disease) (HCC)   . GERD (gastroesophageal reflux disease)   . History of pyloric stenosis   . Hypertension   . Macular degeneration   . Osteoarthritis   . Thyroid disease   . Weight loss     Patient Active Problem List   Diagnosis Date Noted  . Multiple pelvic fractures (HCC) 11/04/2015  . Fall at home 11/04/2015  . Prolonged Q-T interval on ECG 11/04/2015  . RBBB 11/04/2015  . Dilated bile duct 11/04/2015  . Pancreatic duct dilated (HCC) 11/04/2015  . Abnormal CT of the abdomen 11/04/2015  . Fall 04/19/2015  . Protein-calorie malnutrition, severe (HCC) 04/19/2015  . Closed fracture of right iliac wing (HCC) 04/18/2015  . Peri-prosthetic fracture around prosthetic hip (HCC) 04/18/2015  . Protein calorie malnutrition (HCC) 04/18/2015  . Hypokalemia 11/24/2014  . Iron deficiency anemia 12/30/2012  . COPD 11/19/2007    . UTI'S, RECURRENT 11/19/2007  . HEEL SPUR 11/19/2007  . Hypothyroidism 11/04/2007  . Essential hypertension 11/04/2007  . GERD 11/04/2007  . Osteoarthritis 11/04/2007  . WEIGHT LOSS 11/04/2007  . PYLORIC STENOSIS 07/18/2007  . OTHER OBSTRUCTION OF DUODENUM 08/10/2006    Past Surgical History:  Procedure Laterality Date  . APPENDECTOMY    . CATARACT EXTRACTION    . nissan fundoplication  08/2007  . ORIF HIP FRACTURE    . TUBAL LIGATION      OB History    Gravida Para Term Preterm AB Living   12 9     3      SAB TAB Ectopic Multiple Live Births                   Home Medications    Prior to Admission medications   Medication Sig Start Date End Date Taking? Authorizing Provider  levothyroxine (SYNTHROID, LEVOTHROID) 100 MCG tablet Take 1 tablet (100 mcg total) by mouth daily. 04/28/16  Yes Gordy Savers, MD  metoprolol tartrate (LOPRESSOR) 25 MG tablet Take 1 tablet (25 mg total) by mouth 2 (two) times daily. 04/28/16  Yes Gordy Savers, MD  Multiple Vitamin (MULTIVITAMIN WITH MINERALS) TABS tablet Take 1 tablet by mouth daily.   Yes Historical Provider, MD    Family History History reviewed. No pertinent family history.  Social History Social History  Substance Use Topics  . Smoking status: Never Smoker  .  Smokeless tobacco: Never Used  . Alcohol use No     Allergies   Review of patient's allergies indicates no known allergies.   Review of Systems Review of Systems  Constitutional: Positive for appetite change and fatigue. Negative for chills and fever.  Respiratory: Negative for shortness of breath.   Cardiovascular: Negative for chest pain.  Gastrointestinal: Positive for abdominal pain, nausea and vomiting. Negative for constipation and diarrhea.  Neurological: Positive for weakness.   Physical Exam Updated Vital Signs BP 171/83   Pulse 92   Temp 98.2 F (36.8 C) (Oral)   Resp 18   SpO2 93%   Physical Exam  Constitutional: She is  oriented to person, place, and time.  Elderly, thin female sitting up in bed in NAD. Son at bedside.   HENT:  Head: Normocephalic and atraumatic.  Nose: Nose normal.  Dry mucous membranes  Eyes: Conjunctivae and EOM are normal. Pupils are equal, round, and reactive to light. Right eye exhibits no discharge. Left eye exhibits no discharge. No scleral icterus.  Cardiovascular: Normal rate, regular rhythm and normal heart sounds.   No murmur heard. Pulmonary/Chest: Effort normal and breath sounds normal. No respiratory distress. She has no wheezes.  Abdominal: Soft. Bowel sounds are normal. She exhibits no distension. There is tenderness (Diffusely, mild).  Musculoskeletal: She exhibits no edema or tenderness.  Neurological: She is alert and oriented to person, place, and time.  Skin: Skin is warm and dry.  Psychiatric: She has a normal mood and affect. Her behavior is normal.   ED Treatments / Results  Labs (all labs ordered are listed, but only abnormal results are displayed) Labs Reviewed  COMPREHENSIVE METABOLIC PANEL - Abnormal; Notable for the following:       Result Value   Glucose, Bld 170 (*)    All other components within normal limits  URINALYSIS, ROUTINE W REFLEX MICROSCOPIC (NOT AT [facility]) - Abnormal; Notable for the following:    APPearance CLOUDY (*)    Hgb urine dipstick MODERATE (*)    Ketones, ur 15 (*)    Protein, ur 30 (*)    Nitrite POSITIVE (*)    Leukocytes, UA SMALL (*)    All other components within normal limits  URINE MICROSCOPIC-ADD ON - Abnormal; Notable for the following:    Squamous Epithelial / LPF 6-30 (*)    Bacteria, UA MANY (*)    All other components within normal limits  URINE CULTURE  LIPASE, BLOOD  CBC  TSH    EKG  EKG Interpretation None       Radiology No results found.  Procedures Procedures (including critical care time)  Medications Ordered in ED Medications  0.9 %  sodium chloride infusion (not administered)    cefTRIAXone (ROCEPHIN) 1 g in dextrose 5 % 50 mL IVPB (not administered)  multivitamin with minerals tablet 1 tablet (not administered)  metoprolol tartrate (LOPRESSOR) tablet 25 mg (not administered)  levothyroxine (SYNTHROID, LEVOTHROID) tablet 100 mcg (not administered)  ondansetron (ZOFRAN-ODT) disintegrating tablet 4 mg (4 mg Oral Given 05/01/16 1215)  sodium chloride 0.9 % bolus 1,000 mL (0 mLs Intravenous Stopped 05/01/16 1602)   Initial Impression / Assessment and Plan / ED Course  I have reviewed the triage vital signs and the nursing notes.  Pertinent labs & imaging results that were available during my care of the patient were reviewed by me and considered in my medical decision making (see chart for details).  Clinical Course   1315 Will given 1L  bolus then reassess.   1607 Patient still feeling poorly despite IVF rehydration. Will consult hospitalist team for possible overnight observation.   Final Clinical Impressions(s) / ED Diagnoses   Final diagnoses:  Dehydration   Patient presenting with nausea and vomiting, found to be dehydrated. Nausea improved after Zofran ODT, however patient still feeling very weak despite 1L IVF bolus. Also with questionable UTI on UA. Urine cx pending. Consulted [facility] hospitalists, will place in observation.   New Prescriptions New Prescriptions   No medications on file     Marquette SaaAbigail Joseph Maleeha Halls, MD 05/01/16 1630    Cathren LaineKevin Steinl, MD 05/04/16 [phone]

## 2016-05-01 NOTE — Progress Notes (Signed)
Pt admitted from ED. Pt is A&Ox4. Pt lives at home alone. Pt's skin is warm, dry and intact. Pt oriented to room. Pt told to call for assistance before getting OOB. Pt stated understanding. Will continue to monitor pt. Nelda MarseilleJenny Thacker, RN

## 2016-05-01 NOTE — ED Notes (Signed)
Ordered dinner tray; heart healthy diet

## 2016-05-01 NOTE — ED Triage Notes (Signed)
Pt reports onset yesterday of generalized abd discomfort and n/v. Denies diarrhea.

## 2016-05-01 NOTE — H&P (Signed)
TRH H&P   Patient Demographics:    Katherine Flynn, is a 82 y.o. female  MRN: [id number]   DOB - Dec 09, 1922  Admit Date - 05/01/2016  Outpatient Primary MD for the patient is Rogelia Boga, MD  Patient coming from: Home  Chief Complaint  Patient presents with  . Abdominal Pain  . Emesis      HPI:    Katherine Flynn  is a 82 y.o. female, with H/O multiple falls, Chr low back pain, OA, Hypothyroidism, GERD, COPD, who live at home alone comes in with 2 day H/O N&V along with generalized weakness, mild lower abdominal pain, mild dysuria, no chest pain, no fevers, no focal weakness, has chronic low back pain which is unchanged, comes in today for continued Gen weakness and Nausea. No other subjective complains.  In the ER found to UTI, too weak to stand without help, since she lives alone I was called to admit for Obs.    Review of systems:    In addition to the HPI above,   No Fever-chills, No Headache, No changes with Vision or hearing, No problems swallowing food or Liquids, No Chest pain, Cough or Shortness of Breath, No Abdominal pain, +ve Nausea -  Vommitting, Bowel movements are regular, No Blood in stool or Urine, No dysuria, No new skin rashes or bruises, No new joints pains-aches, Chronic low back pain No new weakness, tingling, numbness in any extremity, No recent weight gain or loss, No polyuria, polydypsia or polyphagia, No significant Mental Stressors.  A full 10 point Review of Systems was done, except as stated above, all other Review of Systems were negative.   With Past History of the following :    Past Medical History:  Diagnosis Date  . Chronic pain syndrome   .  COPD (chronic obstructive pulmonary disease) (HCC)   . GERD (gastroesophageal reflux disease)   . History of pyloric stenosis   . Hypertension   . Macular degeneration   . Osteoarthritis   . Thyroid disease   . Weight loss       Past Surgical History:  Procedure Laterality Date  . APPENDECTOMY    . CATARACT EXTRACTION    . nissan fundoplication  08/2007  . ORIF HIP FRACTURE    . TUBAL LIGATION        Social  History:     Social History  Substance Use Topics  . Smoking status: Never Smoker  . Smokeless tobacco: Never Used  . Alcohol use No         Family History :    No H/O DM   Home Medications:   Prior to Admission medications   Medication Sig Start Date End Date Taking? Authorizing Provider  levothyroxine (SYNTHROID, LEVOTHROID) 100 MCG tablet Take 1 tablet (100 mcg total) by mouth daily. 04/28/16  Yes Gordy SaversPeter F Kwiatkowski, MD  metoprolol tartrate (LOPRESSOR) 25 MG tablet Take 1 tablet (25 mg total) by mouth 2 (two) times daily. 04/28/16  Yes Gordy SaversPeter F Kwiatkowski, MD  Multiple Vitamin (MULTIVITAMIN WITH MINERALS) TABS tablet Take 1 tablet by mouth daily.   Yes Historical Provider, MD     Allergies:    No Known Allergies   Physical Exam:   Vitals  Blood pressure 171/83, pulse 92, temperature 98.2 F (36.8 C), temperature source Oral, resp. rate 18, SpO2 93 %.   1. General frail elderly white female lying in bed in mils chronic low back pain  2. Normal affect and insight, Not Suicidal or Homicidal, Awake Alert, Oriented X 3.  3. No F.N deficits, ALL C.Nerves Intact, Strength 5/5 all 4 extremities, Sensation intact all 4 extremities, Plantars down going.  4. Ears and Eyes appear Normal, Conjunctivae clear, PERRLA. Moist Oral Mucosa.  5. Supple Neck, No JVD, No cervical lymphadenopathy appriciated, No Carotid Bruits.  6. Symmetrical Chest wall movement, Good air movement bilaterally, CTAB.  7. RRR, No Gallops, Rubs or Murmurs, No Parasternal Heave.  8.  Positive Bowel Sounds, Abdomen Soft, No tenderness, No organomegaly appriciated,No rebound -guarding or rigidity.  9.  No Cyanosis, Normal Skin Turgor, No Skin Rash or Bruise.  10. Good muscle tone,  joints appear normal , no effusions, Normal ROM.  11. No Palpable Lymph Nodes in Neck or Axillae      Data Review:    CBC  Recent Labs Lab 05/01/16 1218  WBC 5.4  HGB 12.0  HCT 38.1  PLT 342  MCV 91.8  MCH 28.9  MCHC 31.5  RDW 12.8   ------------------------------------------------------------------------------------------------------------------  Chemistries   Recent Labs Lab 05/01/16 1218  NA 138  K 3.5  CL 102  CO2 26  GLUCOSE 170*  BUN 11  CREATININE 0.75  CALCIUM 9.7  AST 22  ALT 14  ALKPHOS 59  BILITOT 0.5   ------------------------------------------------------------------------------------------------------------------ estimated creatinine clearance is 31.1 mL/min (by C-G formula based on SCr of 0.8 mg/dL). ------------------------------------------------------------------------------------------------------------------ No results for input(s): TSH, T4TOTAL, T3FREE, THYROIDAB in the last 72 hours.  Invalid input(s): FREET3  Coagulation profile No results for input(s): INR, PROTIME in the last 168 hours. ------------------------------------------------------------------------------------------------------------------- No results for input(s): DDIMER in the last 72 hours. -------------------------------------------------------------------------------------------------------------------  Cardiac Enzymes No results for input(s): CKMB, TROPONINI, MYOGLOBIN in the last 168 hours.  Invalid input(s): CK ------------------------------------------------------------------------------------------------------------------ No results found for:  BNP   ---------------------------------------------------------------------------------------------------------------  Urinalysis    Component Value Date/Time   COLORURINE YELLOW 05/01/2016 1457   APPEARANCEUR CLOUDY (A) 05/01/2016 1457   LABSPEC 1.021 05/01/2016 1457   PHURINE 5.5 05/01/2016 1457   GLUCOSEU NEGATIVE 05/01/2016 1457   HGBUR MODERATE (A) 05/01/2016 1457   BILIRUBINUR NEGATIVE 05/01/2016 1457   KETONESUR 15 (A) 05/01/2016 1457   PROTEINUR 30 (A) 05/01/2016 1457   UROBILINOGEN 0.2 04/20/2015 1452   NITRITE POSITIVE (A) 05/01/2016 1457   LEUKOCYTESUR SMALL (A) 05/01/2016 1457    ----------------------------------------------------------------------------------------------------------------  Imaging Results:    Dg Abd Portable 1v  Result Date: 05/01/2016 CLINICAL DATA:  Nausea and emesis for 2 days. EXAM: PORTABLE ABDOMEN - 1 VIEW COMPARISON:  CT of the abdomen and pelvis 11/04/2015 FINDINGS: The bowel gas pattern is normal. No radio-opaque calculi or other significant radiographic abnormality are seen. Vascular calcifications are noted. Partially visualized right total hip arthroplasty. IMPRESSION: Nonobstructive bowel gas pattern. Electronically Signed   By: Ted Mcalpineobrinka  Dimitrova M.D.   On: 05/01/2016 16:40        Assessment & Plan:      1. UTI with N&V and Gen weakness - KUB stable, vitals stable, abd exam benign, No emesis in 6 hrs after zofran, 23 hrs Obs for IV Rocephin, Ur cultures , IVF, monitor Orthostatics, BP stable here, PT eval, if stable DC home with HHPT in am.  2. Chr Low back pain - supportive Rx.  3.Hypothyroidism - continue synthroid, check TSH.  4.HTN - continue Lopressor.   DVT Prophylaxis Heparin   AM Labs Ordered, also please review Full Orders  Family Communication: Admission, patients condition and plan of care including tests being ordered have been discussed with the patient and daughter who indicate understanding and agree with  the plan and Code Status.  Code Status DNR  Likely DC to  Home in am  Condition Fair  Consults called: None    Admission status: Obs    Time spent in minutes : 35   Paislynn Hegstrom K M.D on 05/01/2016 at 4:47 PM  Between 7am to 7pm - Pager - [phone]. After 7pm go to [email or web address] - password [email or web address]  [facility] - Office  [phone]

## 2016-05-01 NOTE — Telephone Encounter (Signed)
Vomiting and dizziness. Instructed to call 911 right away. 911 was not called nor patient seen for condition. Phoned today and spoke with patients sitter Katherine Flynn(Eleanor). She stated patient could not afford to call EMS but that son is coming over today to take patient to ED. According to sitter she is alert and talking, has slight fever and still experiencing dizziness upon rising.    PLEASE NOTE: All timestamps contained within this report are represented as [place] Standard Time. CONFIDENTIALTY NOTICE: This fax transmission is intended only for the addressee. It contains information that is legally privileged, confidential or otherwise protected from use or disclosure. If you are not the intended recipient, you are strictly prohibited from reviewing, disclosing, copying using or disseminating any of this information or taking any action in reliance on or regarding this information. If you have received this fax in error, please notify us immediately by telephone so that we can arrange for its return to us. Phone: [phone], Toll-Free: [phone], Fax: [phone] Page: 1 of 2 Call Id: [id number] [facility] Night - Client TELEPHONE ADVICE RECORD [facility] Patient Name: Katherine BillBARBARA BOTTOMLE Y Gender: Female DOB: Jul 16, 1923 Age: 82 Y 9 M 11 D Return Phone Number: [phone] (Primary) Address: City/State/Zip: [place] Client [facility] Night - Client Client Site [facility] - Night Physician Derryl HarborKwiatkowski, Pete - MD Contact Type Call Who Is Calling Patient / Member / Family / Caregiver Call Type Triage / Clinical Caller Name Verlan FriendsLaura Hitchcock Relationship To Patient Neighbor Return Phone Number Please choose phone number Chief Complaint Vomiting Reason for Call Symptomatic / Request for Health Information Initial Comment Caller states pt is vomiting since about 6:30 or 7:00 pm last night. She is shaking, weak all  over and room is spinning. She has no fever. PreDisposition Home Care Translation No Nurse Assessment Nurse: Daisy BlossomYazel, Lavada Date/Time Lamount Cohen(Eastern Time): 05/01/2016 4:49:42 AM Confirm and document reason for call. If symptomatic, describe symptoms. You must click the next button to save text entered. ---Caller states she has been vomiting since about 6:30 or 7:00 pm last night. She is shaking, weak all over and room is spinning. Denies any temperature at this time. Neighbor is with her and states that her son is supposed to come between 7-8 am this morning. States that she has vomited 20 or more times. Has the patient traveled out of the country within the last 30 days? ---Not Applicable Does the patient have any new or worsening symptoms? ---Yes Will a triage be completed? ---Yes Related visit to physician within the last 2 weeks? ---No Does the PT have any chronic conditions? (i.e. diabetes, asthma, etc.) ---Yes List chronic conditions. ---HTN Is this a behavioral health or substance abuse call? ---No Guidelines Guideline Title Affirmed Question Affirmed Notes Nurse Date/Time (Eastern Time) Vomiting Shock suspected (e.g., cold/pale/clammy skin, too weak to stand, low BP, rapid pulse) Evalyn CascoYazel, Lavada 05/01/2016 4:55:00 AM PLEASE NOTE: All timestamps contained within this report are represented as [place] Standard Time. CONFIDENTIALTY NOTICE: This fax transmission is intended only for the addressee. It contains information that is legally privileged, confidential or otherwise protected from use or disclosure. If you are not the intended recipient, you are strictly prohibited from reviewing, disclosing, copying using or disseminating any of this information or taking any action in reliance on or regarding this information. If you have received this fax in error, please notify us immediately by telephone so that we can arrange for its return to us. Phone: [phone],  Toll-Free:  [phone], Fax: [phone] Page: 2 of 2 Call Id: [id number] Disp. Time Lamount Cohen(Eastern Time) Disposition Final User 05/01/2016 4:59:10 AM 911 Outcome Documentation Daisy BlossomYazel, Lavada Reason: Neighbor declined calling 911, advised that she has to wait on her son to come this morning. 05/01/2016 4:58:35 AM Call EMS 911 Now Yes Daisy BlossomYazel, Lavada Caller Understands: Yes Disagree/Comply: Comply Care Advice Given Per Guideline CALL EMS 911 NOW: Immediate medical attention is needed. You need to hang up and call 911 (or an ambulance). Probation officer([facility] Discretion: I'll call you back in a few minutes to be sure you were able to reach them.) FIRST AID: Lie down with the feet elevated (Reason: counteract shock) CARE ADVICE per Vomiting (Adult) guideline. [facility]

## 2016-05-02 DIAGNOSIS — E876 Hypokalemia: Secondary | ICD-10-CM

## 2016-05-02 DIAGNOSIS — N390 Urinary tract infection, site not specified: Secondary | ICD-10-CM

## 2016-05-02 DIAGNOSIS — E860 Dehydration: Secondary | ICD-10-CM

## 2016-05-02 LAB — BASIC METABOLIC PANEL
Anion gap: 6 (ref 5–15)
BUN: 10 mg/dL (ref 6–20)
CALCIUM: 8.8 mg/dL — AB (ref 8.9–10.3)
CO2: 26 mmol/L (ref 22–32)
CREATININE: 0.65 mg/dL (ref 0.44–1.00)
Chloride: 108 mmol/L (ref 101–111)
GFR calc Af Amer: >60 mL/min (ref >60)
GLUCOSE: 93 mg/dL (ref 65–99)
POTASSIUM: 3.2 mmol/L — AB (ref 3.5–5.1)
Sodium: 140 mmol/L (ref 135–145)

## 2016-05-02 LAB — CBC
HEMATOCRIT: 32.3 % — AB (ref 36.0–46.0)
Hemoglobin: 10.3 g/dL — ABNORMAL LOW (ref 12.0–15.0)
MCH: 29.6 pg (ref 26.0–34.0)
MCHC: 31.9 g/dL (ref 30.0–36.0)
MCV: 92.8 fL (ref 78.0–100.0)
Platelets: 291 10*3/uL (ref 150–400)
RBC: 3.48 MIL/uL — ABNORMAL LOW (ref 3.87–5.11)
RDW: 13.1 % (ref 11.5–15.5)
WBC: 6 10*3/uL (ref 4.0–10.5)

## 2016-05-02 MED ORDER — SODIUM CHLORIDE 0.9 % IV BOLUS (SEPSIS)
500.0000 mL | Freq: Once | INTRAVENOUS | Status: AC
  Administered 2016-05-02: 500 mL via INTRAVENOUS

## 2016-05-02 MED ORDER — POTASSIUM CHLORIDE CRYS ER 20 MEQ PO TBCR
40.0000 meq | EXTENDED_RELEASE_TABLET | Freq: Once | ORAL | Status: AC
  Administered 2016-05-02: 40 meq via ORAL
  Filled 2016-05-02: qty 2

## 2016-05-02 NOTE — Evaluation (Signed)
Physical Therapy Evaluation Patient Details Name: Katherine Flynn MRN: [id number] DOB: 1923/03/28 Today's Date: 05/02/2016   History of Present Illness  BarbaraBottomleyis a 82 y.o.female,with H/O multiple falls, Chr low back pain, OA, Hypothyroidism, GERD, COPD. Pt admitted with 2 day H/O N&V along with generalized weakness, mild lower abdominal pain, and mild dysuria. Pt found to have a UTI.  Clinical Impression  Pt admitted with the above and presents with deficits listed below. Pt demonstrates decreased balance and safety awareness with functional mobility. Lives at home alone and has limited assistance from family and neighbor. PT recommends SNF to address deficits of weakness, balance, mobility, and safety awareness to achieve mod I to return home safely. However, pt is refusing SNF at this time. PT consulted with son, Katherine Flynn, who is supportive of SNF and will discuss rehab needs with pt. If pt is not agreeable to SNF, PT [facility] and 24/7 supervision to address current deficits at home. Continue acute follow.     Follow Up Recommendations SNF;Supervision/Assistance - 24 hour    Equipment Recommendations  None recommended by PT    Recommendations for Other Services       Precautions / Restrictions Precautions Precautions: Fall Restrictions Weight Bearing Restrictions: No      Mobility  Bed Mobility Overal bed mobility: Needs Assistance Bed Mobility: Supine to Sit     Supine to sit: Min guard     General bed mobility comments: Pt required increased time to get to EOB with HOB elevated and use of bedrails.  Transfers Overall transfer level: Needs assistance Equipment used: Rolling walker (2 wheeled) Transfers: Sit to/from Stand Sit to Stand: Min assist         General transfer comment: Min A for safety. Pt has tendency to "plop down" when sitting without checking for surface behind her. When given verbal cues to find chair, pt continued to sit down  without acknowledging v/c's.   Ambulation/Gait Ambulation/Gait assistance: Min assist Ambulation Distance (Feet): 20 Feet Assistive device: Rolling walker (2 wheeled) Gait Pattern/deviations: Step-through pattern;Decreased stride length;Trunk flexed   Gait velocity interpretation: Below normal speed for age/gender General Gait Details: Pt required increased time to navigate RW around obstacles in room, having to stop, pick up the walker to shift, and then continue walking. Pt with need for verbal cues for safety with RW.   Stairs            Wheelchair Mobility    Modified Rankin (Stroke Patients Only)       Balance Overall balance assessment: Needs assistance Sitting-balance support: No upper extremity supported;Feet supported Sitting balance-Leahy Scale: Fair     Standing balance support: Bilateral upper extremity supported;During functional activity Standing balance-Leahy Scale: Poor Standing balance comment: Pt reliant on RW for support while standing.                             Pertinent Vitals/Pain Pain Assessment: No/denies pain    Home Living Family/patient expects to be discharged to:: Private residence Living Arrangements: Alone Available Help at Discharge: Family;Neighbor;Available PRN/intermittently Type of Home: House Home Access: Stairs to enter Entrance Stairs-Rails: LawyerLeft;Right Entrance Stairs-Number of Steps: 3-4 Home Layout: One level Home Equipment: Walker - 2 wheels;Shower seat;Grab bars - tub/shower      Prior Function Level of Independence: Needs assistance   Gait / Transfers Assistance Needed: Uses RW for ambulation  ADL's / Homemaking Assistance Needed: Neighbor checks in 2-3 hrs/day to  help with cooking. Daughters assist with bathing and grocery shopping 2x/week.        Hand Dominance   Dominant Hand: Left    Extremity/Trunk Assessment   Upper Extremity Assessment: Generalized weakness           Lower  Extremity Assessment: Generalized weakness      Cervical / Trunk Assessment: Kyphotic  Communication   Communication: No difficulties  Cognition Arousal/Alertness: Awake/alert Behavior During Therapy: WFL for tasks assessed/performed Overall Cognitive Status: Impaired/Different from baseline Area of Impairment: Safety/judgement         Safety/Judgement: Decreased awareness of safety;Decreased awareness of deficits          General Comments General comments (skin integrity, edema, etc.): Pt reported feeling nauseous at end of session.     Exercises        Assessment/Plan    PT Assessment Patient needs continued PT services  PT Diagnosis Difficulty walking;Abnormality of gait;Generalized weakness   PT Problem List Decreased strength;Decreased range of motion;Decreased activity tolerance;Decreased balance;Decreased mobility;Decreased coordination;Decreased knowledge of use of DME;Decreased safety awareness  PT Treatment Interventions DME instruction;Gait training;Stair training;Functional mobility training;Therapeutic activities;Therapeutic exercise;Neuromuscular re-education;Balance training;Patient/family education;Cognitive remediation   PT Goals (Current goals can be found in the Care Plan section) Acute Rehab PT Goals Patient Stated Goal: to go back home PT Goal Formulation: With patient Time For Goal Achievement: 05/09/16 Potential to Achieve Goals: Good    Frequency Min 3X/week   Barriers to discharge Decreased caregiver support Discussed with pt the option for ST rehab. Pt against this and wishes to return home. PT advised pt that she is not safe to return home without 24/7 assistance. PT contacted son, Katherine Flynn, who agrees pt would benefit from rehab as the family/neighbor are not available for 24/7 assistance at this time.     Co-evaluation               End of Session Equipment Utilized During Treatment: Gait belt Activity Tolerance: Patient limited by  fatigue;Other (comment) (nausea) Patient left: in chair;with call bell/phone within reach;with chair alarm set Nurse Communication: Mobility status    Functional Assessment Tool Used: clinical judgement Functional Limitation: Mobility: Walking and moving around Mobility: Walking and Moving Around Current Status ([id number](G8978): At least 20 percent but less than 40 percent impaired, limited or restricted Mobility: Walking and Moving Around Goal Status [phone](G8979): At least 1 percent but less than 20 percent impaired, limited or restricted    Time: 0956-1021 PT Time Calculation (min) (ACUTE ONLY): 25 min   Charges:   PT Evaluation $PT Eval Moderate Complexity: 1 Procedure PT Treatments $Gait Training: 8-22 mins   PT G Codes:   PT G-Codes **NOT FOR INPATIENT CLASS** Functional Assessment Tool Used: clinical judgement Functional Limitation: Mobility: Walking and moving around Mobility: Walking and Moving Around Current Status ([id number](G8978): At least 20 percent but less than 40 percent impaired, limited or restricted Mobility: Walking and Moving Around Goal Status [phone](G8979): At least 1 percent but less than 20 percent impaired, limited or restricted    [facility] Hospital[facility]ara Kabria Hetzer 05/02/2016, 11:27 AM  Park Literara A Razi Hickle, SPT (student physical therapist) Acute Rehabilitation Services [phone]

## 2016-05-02 NOTE — Care Management Obs Status (Signed)
MEDICARE OBSERVATION STATUS NOTIFICATION   Patient Details  Name: Katherine ShoutsBarbara A Sundby MRN: [id number] Date of Birth: 12/04/1922   Medicare Observation Status Notification Given:  Yes    Lawerance Sabalebbie Rider Ermis, RN 05/02/2016, 2:05 PM

## 2016-05-02 NOTE — Progress Notes (Signed)
Pt says she is dizzy and is getting worse. BP was 165/69 after scheduled Lopressor had been given. Craige CottaKirby , NP notified. She advised to give a bolus of 500ml and continue to monitor her BP. Pt was re-assessed mid way into the bolus, BP was 177/77, Upon completion pt reported that she does not feel dizzy anymore. NP notified. Pt is currently resting well. Will continue to monitor.

## 2016-05-02 NOTE — Progress Notes (Signed)
PROGRESS NOTE    Katherine Flynn  ZOX:[id number] DOB: 1922-12-25 DOA: 05/01/2016 PCP: Rogelia BogaKWIATKOWSKI,PETER FRANK, MD   Brief Narrative:  Katherine Flynn is a pleasant 82 year old female who currently resides in the community, admitted to the medical service on 05/01/2016 presented with nausea vomiting and weakness. She was found to have urinary tract infection and started on empiric IV antibiotic therapy with ceftriaxone.   Assessment & Plan:   Principal Problem:   UTI'S, RECURRENT Active Problems:   Hypothyroidism   Essential hypertension   Chronic bronchitis with COPD (chronic obstructive pulmonary disease) (HCC)   Multiple pelvic fractures (HCC)   Dehydration  1.  Functional decline -Katherine Flynn is a 82 year old female currently residing in the community presenting with generalized weakness, unable to ambulate. -Workup revealed urinary tract infection -On 05/02/2016 looking better, she ambulated around the nurses station with a walker  2.  Urinary tract infection -Presented with functional decline, urinalysis showing the presence of many bacteria, nitrate and leukocytes -She was started on ceftriaxone 1 g IV every 24 hours -Urine cultures pending -Showing clinical improvement  3.  Nausea/vomiting -Improved, I suspect could have been related to urinary tract infection -Tolerating by mouth intake  4.  Hypokalemia -Showing a potassium of 3.2, she was given 40 mEq of oral potassium replacement -Could be related to GI losses  DVT prophylaxis: Heparin Code Status: DNR Family Communication: By spoke to her son [name] over telephone conversation updated home on patient's condition  Disposition Plan:  After the discharge the next 24 hours if she continues to show improvement  Consultants:     Procedures:     Antimicrobials:   Ceftriaxone 1 g IV daily before starting on a 21 2017   Subjective: She was assisted out of bed ambulating around the nurses station was  walker, overall states feeling much better  Objective: Vitals:   05/01/16 1918 05/01/16 1926 05/01/16 2044 05/02/16 0613  BP:  (!) 146/59  (!) 162/69  Pulse:  97  72  Resp:  18  20  Temp:  98.3 F (36.8 C)  98.1 F (36.7 C)  TempSrc:  Oral  Oral  SpO2: 95% 95%  94%  Weight:   42.5 kg (93 lb 9.6 oz) 42.4 kg (93 lb 8 oz)  Height:   5' (1.524 m)     Intake/Output Summary (Last 24 hours) at 05/02/16 1619 Last data filed at 05/02/16 1508  Gross per 24 hour  Intake          1318.75 ml  Output              400 ml  Net           918.75 ml   Filed Weights   05/01/16 2044 05/02/16 0613  Weight: 42.5 kg (93 lb 9.6 oz) 42.4 kg (93 lb 8 oz)    Examination:  General exam: Appears calm and comfortable, Nontoxic-appearing, overall seems to be doing better Respiratory system: Clear to auscultation. Respiratory effort normal. Cardiovascular system: S1 & S2 heard, RRR. No JVD, murmurs, rubs, gallops or clicks. No pedal edema. Gastrointestinal system: Abdomen is nondistended, soft and nontender. No organomegaly or masses felt. Normal bowel sounds heard. Central nervous system: Alert and oriented. No focal neurological deficits. Extremities: Symmetric 5 x 5 power. Skin: No rashes, lesions or ulcers Psychiatry: Judgement and insight appear normal. Mood & affect appropriate.     Data Reviewed: I have personally reviewed following labs and imaging studies  CBC:  Recent Labs Lab  05/01/16 1218 05/02/16 0545  WBC 5.4 6.0  HGB 12.0 10.3*  HCT 38.1 32.3*  MCV 91.8 92.8  PLT 342 291   Basic Metabolic Panel:  Recent Labs Lab 05/01/16 1218 05/02/16 0545  NA 138 140  K 3.5 3.2*  CL 102 108  CO2 26 26  GLUCOSE 170* 93  BUN 11 10  CREATININE 0.75 0.65  CALCIUM 9.7 8.8*   GFR: Estimated Creatinine Clearance: 31.3 mL/min (by C-G formula based on SCr of 0.8 mg/dL). Liver Function Tests:  Recent Labs Lab 05/01/16 1218  AST 22  ALT 14  ALKPHOS 59  BILITOT 0.5  PROT 6.9    ALBUMIN 3.9    Recent Labs Lab 05/01/16 1218  LIPASE 32   No results for input(s): AMMONIA in the last 168 hours. Coagulation Profile: No results for input(s): INR, PROTIME in the last 168 hours. Cardiac Enzymes: No results for input(s): CKTOTAL, CKMB, CKMBINDEX, TROPONINI in the last 168 hours. BNP (last 3 results) No results for input(s): PROBNP in the last 8760 hours. HbA1C: No results for input(s): HGBA1C in the last 72 hours. CBG: No results for input(s): GLUCAP in the last 168 hours. Lipid Profile: No results for input(s): CHOL, HDL, LDLCALC, TRIG, CHOLHDL, LDLDIRECT in the last 72 hours. Thyroid Function Tests:  Recent Labs  05/01/16 1744  TSH 3.995   Anemia Panel: No results for input(s): VITAMINB12, FOLATE, FERRITIN, TIBC, IRON, RETICCTPCT in the last 72 hours. Sepsis Labs: No results for input(s): PROCALCITON, LATICACIDVEN in the last 168 hours.  No results found for this or any previous visit (from the past 240 hour(s)).    Radiology Studies: Dg Abd Portable 1v  Result Date: 05/01/2016 CLINICAL DATA:  Nausea and emesis for 2 days. EXAM: PORTABLE ABDOMEN - 1 VIEW COMPARISON:  CT of the abdomen and pelvis 11/04/2015 FINDINGS: The bowel gas pattern is normal. No radio-opaque calculi or other significant radiographic abnormality are seen. Vascular calcifications are noted. Partially visualized right total hip arthroplasty. IMPRESSION: Nonobstructive bowel gas pattern. Electronically Signed   By: Ted Mcalpineobrinka  Dimitrova M.D.   On: 05/01/2016 16:40    Scheduled Meds: . cefTRIAXone (ROCEPHIN)  IV  1 g Intravenous Q24H  . heparin  5,000 Units Subcutaneous Q8H  . levothyroxine  100 mcg Oral QAC breakfast  . metoprolol tartrate  25 mg Oral BID  . multivitamin with minerals  1 tablet Oral Daily   Continuous Infusions: . sodium chloride 1,000 mL (05/02/16 0746)     LOS: 0 days    Time spent: 35 minutes   Jeralyn BennettZAMORA, Donita Newland, MD [facility] Pager  [phone]  If 7PM-7AM, please contact night-coverage [email or web address] Password [email or web address] 05/02/2016, 4:19 PM

## 2016-05-02 NOTE — Care Management Note (Addendum)
Case Management Note  Patient Details  Name: Katherine Flynn MRN: [id number] Date of Birth: 1923-01-02  Subjective/Objective:                 Patient in obs for two day history of N/V weakness, UTI. Lives home alone, is A&Ox4, and fairly independent PTA. PT rec SNF. CM will continue to assist CSW in DC plan.   Action/Plan:  Per CSW, anticipate DC to SNF today.   Expected Discharge Date:                  Expected Discharge Plan:  Skilled Nursing Facility  In-House Referral:  Clinical Social Work  Discharge planning Services  CM Consult  Post Acute Care Choice:    Choice offered to:     DME Arranged:    DME Agency:     HH Arranged:    HH Agency:     Status of Service:  In process, will continue to follow  If discussed at Long Length of Stay Meetings, dates discussed:    Additional Comments:  Katherine SabalDebbie Lashaye Fisk, RN 05/02/2016, 3:57 PM

## 2016-05-03 DIAGNOSIS — B9620 Unspecified Escherichia coli [E. coli] as the cause of diseases classified elsewhere: Secondary | ICD-10-CM | POA: Diagnosis not present

## 2016-05-03 DIAGNOSIS — J449 Chronic obstructive pulmonary disease, unspecified: Secondary | ICD-10-CM | POA: Diagnosis not present

## 2016-05-03 DIAGNOSIS — R51 Headache: Secondary | ICD-10-CM | POA: Diagnosis not present

## 2016-05-03 DIAGNOSIS — N390 Urinary tract infection, site not specified: Secondary | ICD-10-CM | POA: Diagnosis not present

## 2016-05-03 DIAGNOSIS — E43 Unspecified severe protein-calorie malnutrition: Secondary | ICD-10-CM | POA: Diagnosis not present

## 2016-05-03 DIAGNOSIS — M1990 Unspecified osteoarthritis, unspecified site: Secondary | ICD-10-CM | POA: Diagnosis not present

## 2016-05-03 DIAGNOSIS — H3530 Unspecified macular degeneration: Secondary | ICD-10-CM | POA: Diagnosis not present

## 2016-05-03 DIAGNOSIS — R278 Other lack of coordination: Secondary | ICD-10-CM | POA: Diagnosis not present

## 2016-05-03 DIAGNOSIS — M6281 Muscle weakness (generalized): Secondary | ICD-10-CM | POA: Diagnosis not present

## 2016-05-03 DIAGNOSIS — F329 Major depressive disorder, single episode, unspecified: Secondary | ICD-10-CM | POA: Diagnosis not present

## 2016-05-03 DIAGNOSIS — R0609 Other forms of dyspnea: Secondary | ICD-10-CM | POA: Diagnosis not present

## 2016-05-03 DIAGNOSIS — R5381 Other malaise: Secondary | ICD-10-CM | POA: Diagnosis not present

## 2016-05-03 DIAGNOSIS — E860 Dehydration: Secondary | ICD-10-CM | POA: Diagnosis not present

## 2016-05-03 DIAGNOSIS — E876 Hypokalemia: Secondary | ICD-10-CM | POA: Diagnosis not present

## 2016-05-03 DIAGNOSIS — Z681 Body mass index (BMI) 19 or less, adult: Secondary | ICD-10-CM | POA: Diagnosis not present

## 2016-05-03 DIAGNOSIS — I10 Essential (primary) hypertension: Secondary | ICD-10-CM | POA: Diagnosis not present

## 2016-05-03 DIAGNOSIS — R739 Hyperglycemia, unspecified: Secondary | ICD-10-CM | POA: Diagnosis not present

## 2016-05-03 DIAGNOSIS — R2689 Other abnormalities of gait and mobility: Secondary | ICD-10-CM | POA: Diagnosis not present

## 2016-05-03 DIAGNOSIS — E039 Hypothyroidism, unspecified: Secondary | ICD-10-CM | POA: Diagnosis not present

## 2016-05-03 DIAGNOSIS — R2681 Unsteadiness on feet: Secondary | ICD-10-CM | POA: Diagnosis not present

## 2016-05-03 DIAGNOSIS — R269 Unspecified abnormalities of gait and mobility: Secondary | ICD-10-CM | POA: Diagnosis not present

## 2016-05-03 DIAGNOSIS — R296 Repeated falls: Secondary | ICD-10-CM | POA: Diagnosis not present

## 2016-05-03 DIAGNOSIS — E46 Unspecified protein-calorie malnutrition: Secondary | ICD-10-CM | POA: Diagnosis not present

## 2016-05-03 LAB — CBC
HEMATOCRIT: 32.1 % — AB (ref 36.0–46.0)
HEMOGLOBIN: 10.1 g/dL — AB (ref 12.0–15.0)
MCH: 29.1 pg (ref 26.0–34.0)
MCHC: 31.5 g/dL (ref 30.0–36.0)
MCV: 92.5 fL (ref 78.0–100.0)
Platelets: 289 10*3/uL (ref 150–400)
RBC: 3.47 MIL/uL — AB (ref 3.87–5.11)
RDW: 12.8 % (ref 11.5–15.5)
WBC: 5.8 10*3/uL (ref 4.0–10.5)

## 2016-05-03 LAB — BASIC METABOLIC PANEL
Anion gap: 6 (ref 5–15)
BUN: 8 mg/dL (ref 6–20)
CO2: 25 mmol/L (ref 22–32)
CREATININE: 0.57 mg/dL (ref 0.44–1.00)
Calcium: 8.5 mg/dL — ABNORMAL LOW (ref 8.9–10.3)
Chloride: 107 mmol/L (ref 101–111)
GFR calc non Af Amer: >60 mL/min (ref >60)
Glucose, Bld: 89 mg/dL (ref 65–99)
POTASSIUM: 3.3 mmol/L — AB (ref 3.5–5.1)
SODIUM: 138 mmol/L (ref 135–145)

## 2016-05-03 MED ORDER — POTASSIUM CHLORIDE CRYS ER 20 MEQ PO TBCR
40.0000 meq | EXTENDED_RELEASE_TABLET | Freq: Once | ORAL | Status: AC
Start: 2016-05-03 — End: 2016-05-03
  Administered 2016-05-03: 40 meq via ORAL
  Filled 2016-05-03: qty 2

## 2016-05-03 NOTE — Progress Notes (Signed)
Physical Therapy Treatment Patient Details Name: Katherine ShoutsBarbara A Flynn MRN: [id number] DOB: Mar 16, 1923 Today's Date: 05/03/2016    History of Present Illness BarbaraBottomleyis a 82 y.o.female,with H/O multiple falls, Chr low back pain, OA, Hypothyroidism, GERD, COPD. Pt admitted with 2 day H/O N&V along with generalized weakness, mild lower abdominal pain, and mild dysuria. Pt found to have a UTI.    PT Comments    Pt continues to need assist for safe ambulation and demonstrated generalized weakness and unsteadiness with gait. Current plan remains appropriate.   Follow Up Recommendations  SNF;Supervision/Assistance - 24 hour     Equipment Recommendations  None recommended by PT    Recommendations for Other Services       Precautions / Restrictions Precautions Precautions: Fall Restrictions Weight Bearing Restrictions: No    Mobility  Bed Mobility Overal bed mobility: Needs Assistance Bed Mobility: Supine to Sit     Supine to sit: Min guard     General bed mobility comments: Pt required increased time to get to EOB with HOB elevated and use of bedrails.  Transfers Overall transfer level: Needs assistance Equipment used: Rolling walker (2 wheeled) Transfers: Sit to/from Stand Sit to Stand: Min guard         General transfer comment: min guard for safety; cues for hand placement   Ambulation/Gait Ambulation/Gait assistance: Min assist Ambulation Distance (Feet): 90 Feet Assistive device: Rolling walker (2 wheeled) Gait Pattern/deviations: Step-through pattern;Decreased stride length;Decreased dorsiflexion - right;Decreased dorsiflexion - left;Trunk flexed     General Gait Details: assist to manage RW and cues for posture and proximity of RW; pt unsteady at times   Stairs            Wheelchair Mobility    Modified Rankin (Stroke Patients Only)       Balance     Sitting balance-Leahy Scale: Fair       Standing balance-Leahy Scale:  Poor                      Cognition Arousal/Alertness: Awake/alert Behavior During Therapy: WFL for tasks assessed/performed Overall Cognitive Status: Within Functional Limits for tasks assessed                      Exercises General Exercises - Lower Extremity Ankle Circles/Pumps: AROM;Both;15 reps;Seated Long Arc Quad: AROM;Both;10 reps;Seated Hip Flexion/Marching: AROM;Both;10 reps;Seated    General Comments        Pertinent Vitals/Pain Pain Assessment: No/denies pain    Home Living                      Prior Function            PT Goals (current goals can now be found in the care plan section) Acute Rehab PT Goals Patient Stated Goal: get better PT Goal Formulation: With patient Time For Goal Achievement: 05/09/16 Potential to Achieve Goals: Good Progress towards PT goals: Progressing toward goals    Frequency  Min 3X/week    PT Plan Current plan remains appropriate    Co-evaluation             End of Session Equipment Utilized During Treatment: Gait belt Activity Tolerance: Patient tolerated treatment well Patient left: in chair;with call bell/phone within reach;with chair alarm set     Time: [id number] PT Time Calculation (min) (ACUTE ONLY): 22 min  Charges:  $Gait Training: 8-22 mins  G Codes:     Katherine MoundKellyn R Flynn Flynn Katherine Flynn, PTA Pager: [phone]   05/03/2016, 1:53 PM

## 2016-05-03 NOTE — Clinical Social Work Note (Signed)
Clinical Social Work Assessment  Patient Details  Name: Katherine Flynn MRN: [id number] Date of Birth: [birth year]  Date of referral:  05/03/16               Reason for consult:  Facility Placement                Permission sought to share information with:  Facility Sport and exercise psychologist, Family Supports Permission granted to share information::  Yes, Verbal Permission Granted  Name::     Architect::  SNFs  Relationship::  Son  Contact Information:     Housing/Transportation Living arrangements for the past 2 months:  [place] of Information:  Patient, Adult Children Patient Interpreter Needed:  None Criminal Activity/Legal Involvement Pertinent to Current Situation/Hospitalization:  No - Comment as needed Significant Relationships:  Adult Children Lives with:  Self Do you feel safe going back to the place where you live?  No Need for family participation in patient care:  Yes (Comment)  Care giving concerns:  CSW received referral for possible SNF placement at time of discharge. CSW met with patient and patient's daughter and son regarding PT recommendation of SNF placement at time of discharge. Per patient's daughter, patient's daughter is currently unable to care for patient at their home given patient's current physical needs and fall risk. Patient and patient's adult children expressed understanding of PT recommendation and are agreeable to SNF placement at time of discharge. CSW to continue to follow and assist with discharge planning needs.   Social Worker assessment / plan:  CSW spoke with patient and patient's adult children concerning possibility of rehab at [facility] before returning home.  Employment status:  Retired Nurse, adult PT Recommendations:  [place] / Referral to community resources:  [place]  Patient/Family's Response to care:  Patient and patient's adult children  recognize need for rehab before returning home and are agreeable to a SNF in [facility], though patient is reluctant. CSW stressed that this would be a short stay to reduce patient's fears. Patient reported preference for [facility] since she has been there before.  Patient/Family's Understanding of and Emotional Response to Diagnosis, Current Treatment, and Prognosis:  Patient/family is realistic regarding therapy needs and expressed being hopeful for SNF placement. No questions/concerns about plan or treatment.    Emotional Assessment Appearance:  Appears stated age Attitude/Demeanor/Rapport:  Other (Appropriate) Affect (typically observed):  Appropriate, Quiet Orientation:  Oriented to Self, Oriented to Place, Oriented to  Time, Oriented to Situation Alcohol / Substance use:  Not Applicable Psych involvement (Current and /or in the community):  No (Comment)  Discharge Needs  Concerns to be addressed:  Care Coordination Readmission within the last 30 days:  No Current discharge risk:  None Barriers to Discharge:  Continued Medical Work up   [employer], [place] 05/03/2016, 8:35 AM

## 2016-05-03 NOTE — Progress Notes (Signed)
Patient will DC to: [facility] Anticipated DC date: 05/03/16 Family notified: Son by phone Transport by: Sharin MonsPTAR   Per MD patient ready for DC to [facility]. RN, patient, patient's family, and facility notified of DC. RN given number for report. DC packet on chart. Ambulance transport requested for patient.   CSW signing off.  Cristobal GoldmannNadia Mirah Nevins, Connecticut[place]CSWA Clinical Social Worker [phone] t

## 2016-05-03 NOTE — NC FL2 (Signed)
[place] MEDICAID FL2 LEVEL OF CARE SCREENING TOOL     IDENTIFICATION  Patient Name: Katherine Flynn Birthdate: 06/22/1923 Sex: female Admission Date (Current Location): 05/01/2016  [facility] [place]   Facility and Address:  [facility], [address], [place], [place] [address]      Provider Number: [id number]  Attending Physician Name and Address:  Tyrone Nineyan B Grunz, MD  Relative Name and Phone Number:  [name], son, [phone]    Current Level of Care: Hospital Recommended Level of Care: Skilled Nursing Facility Prior Approval Number:    Date Approved/Denied:   PASRR Number: [id number]  Discharge Plan: SNF    Current Diagnoses: Patient Active Problem List   Diagnosis Date Noted  . UTI (lower urinary tract infection) 05/01/2016  . Dehydration 05/01/2016  . Multiple pelvic fractures (HCC) 11/04/2015  . Fall at home 11/04/2015  . Prolonged Q-T interval on ECG 11/04/2015  . RBBB 11/04/2015  . Dilated bile duct 11/04/2015  . Pancreatic duct dilated (HCC) 11/04/2015  . Abnormal CT of the abdomen 11/04/2015  . Fall 04/19/2015  . Protein-calorie malnutrition, severe (HCC) 04/19/2015  . Closed fracture of right iliac wing (HCC) 04/18/2015  . Peri-prosthetic fracture around prosthetic hip (HCC) 04/18/2015  . Protein calorie malnutrition (HCC) 04/18/2015  . Hypokalemia 11/24/2014  . Iron deficiency anemia 12/30/2012  . Chronic bronchitis with COPD (chronic obstructive pulmonary disease) (HCC) 11/19/2007  . UTI'S, RECURRENT 11/19/2007  . HEEL SPUR 11/19/2007  . Hypothyroidism 11/04/2007  . Essential hypertension 11/04/2007  . GERD 11/04/2007  . Osteoarthritis 11/04/2007  . WEIGHT LOSS 11/04/2007  . PYLORIC STENOSIS 07/18/2007  . OTHER OBSTRUCTION OF DUODENUM 08/10/2006    Orientation RESPIRATION BLADDER Height & Weight     Place, Situation, Time, Self  Normal Continent Weight: 45.3 kg (99 lb 13.9 oz) Height:  5'  (152.4 cm)  BEHAVIORAL SYMPTOMS/MOOD NEUROLOGICAL BOWEL NUTRITION STATUS      Continent Diet (Please see DC Summary)  AMBULATORY STATUS COMMUNICATION OF NEEDS Skin   Limited Assist Verbally Normal                       Personal Care Assistance Level of Assistance  Bathing, Feeding, Dressing Bathing Assistance: Limited assistance Feeding assistance: Independent Dressing Assistance: Limited assistance     Functional Limitations Info             SPECIAL CARE FACTORS FREQUENCY  PT (By licensed PT)     PT Frequency: 5x/week              Contractures      Additional Factors Info  Code Status, Allergies Code Status Info: DNR Allergies Info: NKA           Current Medications (05/03/2016):  This is the current hospital active medication list Current Facility-Administered Medications  Medication Dose Route Frequency Provider Last Rate Last Dose  . albuterol (PROVENTIL) (2.5 MG/3ML) 0.083% nebulizer solution 2.5 mg  2.5 mg Nebulization Q4H PRN Leroy SeaPrashant K Singh, MD      . bisacodyl (DULCOLAX) EC tablet 5 mg  5 mg Oral Daily PRN Leroy SeaPrashant K Singh, MD      . cefTRIAXone (ROCEPHIN) 1 g in dextrose 5 % 50 mL IVPB  1 g Intravenous Q24H Leroy SeaPrashant K Singh, MD 100 mL/hr at 05/02/16 1537 1 g at 05/02/16 1537  . heparin injection 5,000 Units  5,000 Units Subcutaneous Q8H Leroy SeaPrashant K Singh, MD   5,000 Units at  05/03/16 0517  . HYDROcodone-acetaminophen (NORCO/VICODIN) 5-325 MG per tablet 1 tablet  1 tablet Oral Q6H PRN Leroy SeaPrashant K Singh, MD   1 tablet at 05/03/16 0356  . levothyroxine (SYNTHROID, LEVOTHROID) tablet 100 mcg  100 mcg Oral QAC breakfast Leroy SeaPrashant K Singh, MD   100 mcg at 05/03/16 0809  . metoprolol tartrate (LOPRESSOR) tablet 25 mg  25 mg Oral BID Leroy SeaPrashant K Singh, MD   25 mg at 05/02/16 2224  . multivitamin with minerals tablet 1 tablet  1 tablet Oral Daily Leroy SeaPrashant K Singh, MD   1 tablet at 05/02/16 0912  . ondansetron (ZOFRAN) tablet 4 mg  4 mg Oral Q6H PRN Leroy SeaPrashant K  Singh, MD       Or  . ondansetron (ZOFRAN) injection 4 mg  4 mg Intravenous Q6H PRN Leroy SeaPrashant K Singh, MD   4 mg at 05/01/16 1717     Discharge Medications: Please see discharge summary for a list of discharge medications.  Relevant Imaging Results:  Relevant Lab Results:   Additional Information SSN [id number]  Mearl LatinNadia S Taquila Leys, LCSWA

## 2016-05-03 NOTE — Discharge Instructions (Signed)

## 2016-05-03 NOTE — Discharge Summary (Signed)
Physician Discharge Summary  [name] A Carfagno ZOX:[id number] DOB: 1922-10-14 DOA: 05/01/2016  PCP: Rogelia BogaKWIATKOWSKI,PETER FRANK, MD  Admit date: 05/01/2016 Discharge date: 05/03/2016  Admitted From: Home Disposition:  SNF  Recommendations for Outpatient Follow-up:  1. Follow up with PCP in 1-2 weeks 2. Please follow up on the following pending results: Urine culture sensitivities (> 100k E. coli with unknown sensitivities at discharge).   Home Health: N/A Equipment/Devices: None  Discharge Condition: Stable CODE STATUS: DNR Diet recommendation: Regular  Brief/Interim Summary: Katherine Flynn is a pleasant 82 year old female presenting from home on 05/01/2016 with nausea, vomiting, and weakness. She was found to have urinary tract infection and started on empiric IV antibiotic therapy with ceftriaxone. She never exhibited signs of sepsis, remaining afebrile without leukocytosis, and able to tolerate a full liquid diet. Renal function remained within normal limits. TSH was also normal. Her presenting weakness was diffuse and thought to be related to deconditioning. She was able to ambulate with assistance, but she would benefit from 24 hours supervision and skilled nursing care, so she will be discharged to SNF. She will have completed a 3 day course of rocephin prior to discharge with urine sensitivities pending (grew > 100k E. coli).   Discharge Diagnoses:  Principal Problem:   UTI'S, RECURRENT Active Problems:   Hypothyroidism   Essential hypertension   Chronic bronchitis with COPD (chronic obstructive pulmonary disease) (HCC)   Dehydration  Discharge Instructions See AVS    Medication List    TAKE these medications   levothyroxine 100 MCG tablet Commonly known as:  SYNTHROID, LEVOTHROID Take 1 tablet (100 mcg total) by mouth daily.   metoprolol tartrate 25 MG tablet Commonly known as:  LOPRESSOR Take 1 tablet (25 mg total) by mouth 2 (two) times daily.   multivitamin with  minerals Tabs tablet Take 1 tablet by mouth daily.      Follow-up Information    Rogelia BogaKWIATKOWSKI,PETER FRANK, MD. Schedule an appointment as soon as possible for a visit today.   Specialty:  Internal Medicine Contact information: [address] [place] [address] [phone]          No Known Allergies  Consultations:  None   Procedures/Studies: Dg Abd Portable 1v  Result Date: 05/01/2016 CLINICAL DATA:  Nausea and emesis for 2 days. EXAM: PORTABLE ABDOMEN - 1 VIEW COMPARISON:  CT of the abdomen and pelvis 11/04/2015 FINDINGS: The bowel gas pattern is normal. No radio-opaque calculi or other significant radiographic abnormality are seen. Vascular calcifications are noted. Partially visualized right total hip arthroplasty. IMPRESSION: Nonobstructive bowel gas pattern. Electronically Signed   By: Ted Mcalpineobrinka  Dimitrova M.D.   On: 05/01/2016 16:40   Subjective: Pt reports no abd pain, nausea, or recent emesis. She tolerated breakfast and is eating lunch with a strong appetite. Felt dizzy overnight which has resolved.   Discharge Exam: Vitals:   05/03/16 0053 05/03/16 0611  BP: (!) 171/77 (!) 176/65  Pulse: 62 60  Resp: 16 17  Temp: 98 F (36.7 C) 98.2 F (36.8 C)   Vitals:   05/02/16 2223 05/02/16 2300 05/03/16 0053 05/03/16 0611  BP: (!) 172/70 (!) 165/69 (!) 171/77 (!) 176/65  Pulse: 67 69 62 60  Resp: 18  16 17   Temp: 98 F (36.7 C)  98 F (36.7 C) 98.2 F (36.8 C)  TempSrc: Oral  Oral Oral  SpO2: 96% 97% 97% 94%  Weight:    45.3 kg (99 lb 13.9 oz)  Height:  General: Pt is alert, awake, not in acute distress Cardiovascular: RRR, S1/S2 +, no rubs, no gallops Respiratory: CTA bilaterally, no wheezing, no rhonchi Abdominal: Soft, NT, ND, bowel sounds + Extremities: no edema, no cyanosis  The results of significant diagnostics from this hospitalization (including imaging, microbiology, ancillary and laboratory) are listed below for reference.      Microbiology: Recent Results (from the past 240 hour(s))  Urine culture     Status: Abnormal (Preliminary result)   Collection Time: 05/01/16  2:57 PM  Result Value Ref Range Status   Specimen Description URINE, RANDOM  Final   Special Requests NONE  Final   Culture >=100,000 COLONIES/mL ESCHERICHIA COLI (A)  Final   Report Status PENDING  Incomplete     Labs: BNP (last 3 results) No results for input(s): BNP in the last 8760 hours. Basic Metabolic Panel:  Recent Labs Lab 05/01/16 1218 05/02/16 0545 05/03/16 0522  NA 138 140 138  K 3.5 3.2* 3.3*  CL 102 108 107  CO2 26 26 25   GLUCOSE 170* 93 89  BUN 11 10 8   CREATININE 0.75 0.65 0.57  CALCIUM 9.7 8.8* 8.5*   Liver Function Tests:  Recent Labs Lab 05/01/16 1218  AST 22  ALT 14  ALKPHOS 59  BILITOT 0.5  PROT 6.9  ALBUMIN 3.9    Recent Labs Lab 05/01/16 1218  LIPASE 32   No results for input(s): AMMONIA in the last 168 hours. CBC:  Recent Labs Lab 05/01/16 1218 05/02/16 0545 05/03/16 0522  WBC 5.4 6.0 5.8  HGB 12.0 10.3* 10.1*  HCT 38.1 32.3* 32.1*  MCV 91.8 92.8 92.5  PLT 342 291 289   Cardiac Enzymes: No results for input(s): CKTOTAL, CKMB, CKMBINDEX, TROPONINI in the last 168 hours. BNP: Invalid input(s): POCBNP CBG: No results for input(s): GLUCAP in the last 168 hours. D-Dimer No results for input(s): DDIMER in the last 72 hours. Hgb A1c No results for input(s): HGBA1C in the last 72 hours. Lipid Profile No results for input(s): CHOL, HDL, LDLCALC, TRIG, CHOLHDL, LDLDIRECT in the last 72 hours. Thyroid function studies  Recent Labs  05/01/16 1744  TSH 3.995   Anemia work up No results for input(s): VITAMINB12, FOLATE, FERRITIN, TIBC, IRON, RETICCTPCT in the last 72 hours. Urinalysis    Component Value Date/Time   COLORURINE YELLOW 05/01/2016 1457   APPEARANCEUR CLOUDY (A) 05/01/2016 1457   LABSPEC 1.021 05/01/2016 1457   PHURINE 5.5 05/01/2016 1457   GLUCOSEU NEGATIVE  05/01/2016 1457   HGBUR MODERATE (A) 05/01/2016 1457   BILIRUBINUR NEGATIVE 05/01/2016 1457   KETONESUR 15 (A) 05/01/2016 1457   PROTEINUR 30 (A) 05/01/2016 1457   UROBILINOGEN 0.2 04/20/2015 1452   NITRITE POSITIVE (A) 05/01/2016 1457   LEUKOCYTESUR SMALL (A) 05/01/2016 1457   Sepsis Labs Invalid input(s): PROCALCITONIN,  WBC,  LACTICIDVEN Microbiology Recent Results (from the past 240 hour(s))  Urine culture     Status: Abnormal (Preliminary result)   Collection Time: 05/01/16  2:57 PM  Result Value Ref Range Status   Specimen Description URINE, RANDOM  Final   Special Requests NONE  Final   Culture >=100,000 COLONIES/mL ESCHERICHIA COLI (A)  Final   Report Status PENDING  Incomplete   Time coordinating discharge: Over 30 minutes  SIGNED:  Hazeline Junkeryan Grunz, MD  [facility] 05/03/2016, 3:29 PM Pager   If 7PM-7AM, please contact night-coverage [email or web address] Password [email or web address]

## 2016-05-03 NOTE — Clinical Social Work Placement (Signed)
   CLINICAL SOCIAL WORK PLACEMENT  NOTE  Date:  05/03/2016  Patient Details  Name: Katherine ShoutsBarbara A Luzader MRN: [id number] Date of Birth: [birth year]  Clinical Social Work is seeking post-discharge placement for this patient at the Skilled  Nursing Facility level of care (*CSW will initial, date and re-position this form in  chart as items are completed):  Yes   Patient/family provided with [facility] Clinical Social Work Department's list of facilities offering this level of care within the geographic area requested by the patient (or if unable, by the patient's family).  Yes   Patient/family informed of their freedom to choose among providers that offer the needed level of care, that participate in Medicare, Medicaid or managed care program needed by the patient, have an available bed and are willing to accept the patient.  Yes   Patient/family informed of [facility]'s ownership interest in [facility] and [facility], as well as of the fact that they are under no obligation to receive care at these facilities.  PASRR submitted to EDS on       PASRR number received on       Existing PASRR number confirmed on 05/03/16     FL2 transmitted to all facilities in geographic area requested by pt/family on 05/03/16     FL2 transmitted to all facilities within larger geographic area on       Patient informed that his/her managed care company has contracts with or will negotiate with certain facilities, including the following:        Yes   Patient/family informed of bed offers received.  Patient chooses bed at [facility] [place]     Physician recommends and patient chooses bed at      Patient to be transferred to [facility] [place] on 05/03/16.  Patient to be transferred to facility by [facility]     Patient family notified on 05/03/16 of transfer.  Name of family member notified:  Son     PHYSICIAN Please sign FL2     Additional Comment:     _______________________________________________ Mearl LatinNadia S Yamaira Spinner, LCSWA 05/03/2016, 3:43 PM

## 2016-05-04 LAB — URINE CULTURE: Culture: >=100000 — AB

## 2016-05-07 DIAGNOSIS — F329 Major depressive disorder, single episode, unspecified: Secondary | ICD-10-CM | POA: Diagnosis not present

## 2016-05-07 DIAGNOSIS — R2689 Other abnormalities of gait and mobility: Secondary | ICD-10-CM | POA: Diagnosis not present

## 2016-05-07 DIAGNOSIS — R0609 Other forms of dyspnea: Secondary | ICD-10-CM | POA: Diagnosis not present

## 2016-05-07 DIAGNOSIS — N390 Urinary tract infection, site not specified: Secondary | ICD-10-CM | POA: Diagnosis not present

## 2016-05-07 DIAGNOSIS — E46 Unspecified protein-calorie malnutrition: Secondary | ICD-10-CM | POA: Diagnosis not present

## 2016-05-07 DIAGNOSIS — R5381 Other malaise: Secondary | ICD-10-CM | POA: Diagnosis not present

## 2016-05-07 DIAGNOSIS — M1990 Unspecified osteoarthritis, unspecified site: Secondary | ICD-10-CM | POA: Diagnosis not present

## 2016-05-18 DIAGNOSIS — R296 Repeated falls: Secondary | ICD-10-CM | POA: Diagnosis not present

## 2016-05-18 DIAGNOSIS — I10 Essential (primary) hypertension: Secondary | ICD-10-CM

## 2016-05-18 DIAGNOSIS — M1990 Unspecified osteoarthritis, unspecified site: Secondary | ICD-10-CM | POA: Diagnosis not present

## 2016-05-18 DIAGNOSIS — M6281 Muscle weakness (generalized): Secondary | ICD-10-CM | POA: Diagnosis not present

## 2016-05-18 DIAGNOSIS — G894 Chronic pain syndrome: Secondary | ICD-10-CM | POA: Diagnosis not present

## 2016-05-18 DIAGNOSIS — J449 Chronic obstructive pulmonary disease, unspecified: Secondary | ICD-10-CM | POA: Diagnosis not present

## 2016-05-22 DIAGNOSIS — R296 Repeated falls: Secondary | ICD-10-CM | POA: Diagnosis not present

## 2016-05-22 DIAGNOSIS — J449 Chronic obstructive pulmonary disease, unspecified: Secondary | ICD-10-CM | POA: Diagnosis not present

## 2016-05-22 DIAGNOSIS — I10 Essential (primary) hypertension: Secondary | ICD-10-CM | POA: Diagnosis not present

## 2016-05-22 DIAGNOSIS — M6281 Muscle weakness (generalized): Secondary | ICD-10-CM | POA: Diagnosis not present

## 2016-05-22 DIAGNOSIS — G894 Chronic pain syndrome: Secondary | ICD-10-CM | POA: Diagnosis not present

## 2016-05-22 DIAGNOSIS — M1990 Unspecified osteoarthritis, unspecified site: Secondary | ICD-10-CM | POA: Diagnosis not present

## 2016-05-23 DIAGNOSIS — M6281 Muscle weakness (generalized): Secondary | ICD-10-CM | POA: Diagnosis not present

## 2016-05-23 DIAGNOSIS — G894 Chronic pain syndrome: Secondary | ICD-10-CM | POA: Diagnosis not present

## 2016-05-23 DIAGNOSIS — J449 Chronic obstructive pulmonary disease, unspecified: Secondary | ICD-10-CM | POA: Diagnosis not present

## 2016-05-23 DIAGNOSIS — R296 Repeated falls: Secondary | ICD-10-CM | POA: Diagnosis not present

## 2016-05-23 DIAGNOSIS — M1990 Unspecified osteoarthritis, unspecified site: Secondary | ICD-10-CM | POA: Diagnosis not present

## 2016-05-23 DIAGNOSIS — I10 Essential (primary) hypertension: Secondary | ICD-10-CM | POA: Diagnosis not present

## 2016-05-24 DIAGNOSIS — M1990 Unspecified osteoarthritis, unspecified site: Secondary | ICD-10-CM | POA: Diagnosis not present

## 2016-05-24 DIAGNOSIS — M6281 Muscle weakness (generalized): Secondary | ICD-10-CM | POA: Diagnosis not present

## 2016-05-24 DIAGNOSIS — J449 Chronic obstructive pulmonary disease, unspecified: Secondary | ICD-10-CM | POA: Diagnosis not present

## 2016-05-24 DIAGNOSIS — R296 Repeated falls: Secondary | ICD-10-CM | POA: Diagnosis not present

## 2016-05-24 DIAGNOSIS — G894 Chronic pain syndrome: Secondary | ICD-10-CM | POA: Diagnosis not present

## 2016-05-24 DIAGNOSIS — I10 Essential (primary) hypertension: Secondary | ICD-10-CM | POA: Diagnosis not present

## 2016-05-26 ENCOUNTER — Telehealth: Payer: Self-pay | Admitting: Internal Medicine

## 2016-05-26 DIAGNOSIS — M6281 Muscle weakness (generalized): Secondary | ICD-10-CM | POA: Diagnosis not present

## 2016-05-26 DIAGNOSIS — R296 Repeated falls: Secondary | ICD-10-CM | POA: Diagnosis not present

## 2016-05-26 DIAGNOSIS — M1990 Unspecified osteoarthritis, unspecified site: Secondary | ICD-10-CM | POA: Diagnosis not present

## 2016-05-26 DIAGNOSIS — G894 Chronic pain syndrome: Secondary | ICD-10-CM | POA: Diagnosis not present

## 2016-05-26 DIAGNOSIS — I10 Essential (primary) hypertension: Secondary | ICD-10-CM | POA: Diagnosis not present

## 2016-05-26 DIAGNOSIS — J449 Chronic obstructive pulmonary disease, unspecified: Secondary | ICD-10-CM | POA: Diagnosis not present

## 2016-05-26 NOTE — Telephone Encounter (Signed)
Okay for occupational therapy.

## 2016-05-26 NOTE — Telephone Encounter (Signed)
Left detailed message on Jim's personal voicemail. Verbal order given for OT 2 times a week for 3 weeks and 1 time a week for 1 week okay per Dr.K. Any questions please call office.

## 2016-05-26 NOTE — Telephone Encounter (Signed)
Okay for Occupational Therapy for pt? 

## 2016-05-26 NOTE — Telephone Encounter (Signed)
Rosanne AshingJim, OT went out to evaluate pt on Tuesday 05/23/16 and would like to see the pt 2 times a week for 3 weeks and 1 time a week for 1 week.  Verbal orders are okay

## 2016-05-29 DIAGNOSIS — M1990 Unspecified osteoarthritis, unspecified site: Secondary | ICD-10-CM | POA: Diagnosis not present

## 2016-05-29 DIAGNOSIS — G894 Chronic pain syndrome: Secondary | ICD-10-CM | POA: Diagnosis not present

## 2016-05-29 DIAGNOSIS — M6281 Muscle weakness (generalized): Secondary | ICD-10-CM | POA: Diagnosis not present

## 2016-05-29 DIAGNOSIS — R296 Repeated falls: Secondary | ICD-10-CM | POA: Diagnosis not present

## 2016-05-29 DIAGNOSIS — J449 Chronic obstructive pulmonary disease, unspecified: Secondary | ICD-10-CM | POA: Diagnosis not present

## 2016-05-29 DIAGNOSIS — I10 Essential (primary) hypertension: Secondary | ICD-10-CM | POA: Diagnosis not present

## 2016-05-30 DIAGNOSIS — R296 Repeated falls: Secondary | ICD-10-CM | POA: Diagnosis not present

## 2016-05-30 DIAGNOSIS — G894 Chronic pain syndrome: Secondary | ICD-10-CM | POA: Diagnosis not present

## 2016-05-30 DIAGNOSIS — M1990 Unspecified osteoarthritis, unspecified site: Secondary | ICD-10-CM | POA: Diagnosis not present

## 2016-05-30 DIAGNOSIS — M6281 Muscle weakness (generalized): Secondary | ICD-10-CM | POA: Diagnosis not present

## 2016-05-30 DIAGNOSIS — J449 Chronic obstructive pulmonary disease, unspecified: Secondary | ICD-10-CM | POA: Diagnosis not present

## 2016-05-30 DIAGNOSIS — I10 Essential (primary) hypertension: Secondary | ICD-10-CM | POA: Diagnosis not present

## 2016-06-01 DIAGNOSIS — G894 Chronic pain syndrome: Secondary | ICD-10-CM | POA: Diagnosis not present

## 2016-06-01 DIAGNOSIS — M6281 Muscle weakness (generalized): Secondary | ICD-10-CM | POA: Diagnosis not present

## 2016-06-01 DIAGNOSIS — J449 Chronic obstructive pulmonary disease, unspecified: Secondary | ICD-10-CM | POA: Diagnosis not present

## 2016-06-01 DIAGNOSIS — I10 Essential (primary) hypertension: Secondary | ICD-10-CM | POA: Diagnosis not present

## 2016-06-01 DIAGNOSIS — M1990 Unspecified osteoarthritis, unspecified site: Secondary | ICD-10-CM | POA: Diagnosis not present

## 2016-06-01 DIAGNOSIS — R296 Repeated falls: Secondary | ICD-10-CM | POA: Diagnosis not present

## 2016-06-02 DIAGNOSIS — M1990 Unspecified osteoarthritis, unspecified site: Secondary | ICD-10-CM | POA: Diagnosis not present

## 2016-06-02 DIAGNOSIS — R296 Repeated falls: Secondary | ICD-10-CM | POA: Diagnosis not present

## 2016-06-02 DIAGNOSIS — J449 Chronic obstructive pulmonary disease, unspecified: Secondary | ICD-10-CM | POA: Diagnosis not present

## 2016-06-02 DIAGNOSIS — M6281 Muscle weakness (generalized): Secondary | ICD-10-CM | POA: Diagnosis not present

## 2016-06-02 DIAGNOSIS — G894 Chronic pain syndrome: Secondary | ICD-10-CM | POA: Diagnosis not present

## 2016-06-02 DIAGNOSIS — I10 Essential (primary) hypertension: Secondary | ICD-10-CM | POA: Diagnosis not present

## 2016-06-05 ENCOUNTER — Emergency Department (HOSPITAL_COMMUNITY): Payer: Commercial Managed Care - HMO

## 2016-06-05 ENCOUNTER — Encounter (HOSPITAL_COMMUNITY): Payer: Self-pay

## 2016-06-05 ENCOUNTER — Emergency Department (HOSPITAL_COMMUNITY)
Admission: EM | Admit: 2016-06-05 | Discharge: 2016-06-05 | Disposition: A | Discharge status: Home / Self Care | Payer: Commercial Managed Care - HMO | Attending: Emergency Medicine | Admitting: Emergency Medicine

## 2016-06-05 DIAGNOSIS — S8991XA Unspecified injury of right lower leg, initial encounter: Secondary | ICD-10-CM | POA: Diagnosis not present

## 2016-06-05 DIAGNOSIS — S8001XA Contusion of right knee, initial encounter: Secondary | ICD-10-CM | POA: Diagnosis not present

## 2016-06-05 DIAGNOSIS — S22088A Other fracture of T11-T12 vertebra, initial encounter for closed fracture: Secondary | ICD-10-CM | POA: Insufficient documentation

## 2016-06-05 DIAGNOSIS — I10 Essential (primary) hypertension: Secondary | ICD-10-CM | POA: Insufficient documentation

## 2016-06-05 DIAGNOSIS — N390 Urinary tract infection, site not specified: Secondary | ICD-10-CM | POA: Diagnosis not present

## 2016-06-05 DIAGNOSIS — S0990XA Unspecified injury of head, initial encounter: Secondary | ICD-10-CM | POA: Insufficient documentation

## 2016-06-05 DIAGNOSIS — S299XXA Unspecified injury of thorax, initial encounter: Secondary | ICD-10-CM | POA: Diagnosis not present

## 2016-06-05 DIAGNOSIS — S0083XA Contusion of other part of head, initial encounter: Secondary | ICD-10-CM

## 2016-06-05 DIAGNOSIS — Y92009 Unspecified place in unspecified non-institutional (private) residence as the place of occurrence of the external cause: Secondary | ICD-10-CM | POA: Diagnosis not present

## 2016-06-05 DIAGNOSIS — S22080A Wedge compression fracture of T11-T12 vertebra, initial encounter for closed fracture: Secondary | ICD-10-CM

## 2016-06-05 DIAGNOSIS — Y939 Activity, unspecified: Secondary | ICD-10-CM | POA: Insufficient documentation

## 2016-06-05 DIAGNOSIS — S199XXA Unspecified injury of neck, initial encounter: Secondary | ICD-10-CM | POA: Diagnosis not present

## 2016-06-05 DIAGNOSIS — E039 Hypothyroidism, unspecified: Secondary | ICD-10-CM | POA: Diagnosis not present

## 2016-06-05 DIAGNOSIS — S4992XA Unspecified injury of left shoulder and upper arm, initial encounter: Secondary | ICD-10-CM | POA: Diagnosis not present

## 2016-06-05 DIAGNOSIS — J449 Chronic obstructive pulmonary disease, unspecified: Secondary | ICD-10-CM | POA: Insufficient documentation

## 2016-06-05 DIAGNOSIS — W19XXXD Unspecified fall, subsequent encounter: Secondary | ICD-10-CM

## 2016-06-05 DIAGNOSIS — M25512 Pain in left shoulder: Secondary | ICD-10-CM | POA: Diagnosis not present

## 2016-06-05 DIAGNOSIS — M25461 Effusion, right knee: Secondary | ICD-10-CM | POA: Diagnosis not present

## 2016-06-05 DIAGNOSIS — S3992XA Unspecified injury of lower back, initial encounter: Secondary | ICD-10-CM | POA: Diagnosis not present

## 2016-06-05 DIAGNOSIS — S0012XA Contusion of left eyelid and periocular area, initial encounter: Secondary | ICD-10-CM | POA: Diagnosis not present

## 2016-06-05 DIAGNOSIS — W010XXA Fall on same level from slipping, tripping and stumbling without subsequent striking against object, initial encounter: Secondary | ICD-10-CM | POA: Diagnosis not present

## 2016-06-05 DIAGNOSIS — Y999 Unspecified external cause status: Secondary | ICD-10-CM | POA: Insufficient documentation

## 2016-06-05 DIAGNOSIS — S3993XA Unspecified injury of pelvis, initial encounter: Secondary | ICD-10-CM | POA: Diagnosis not present

## 2016-06-05 DIAGNOSIS — M545 Low back pain: Secondary | ICD-10-CM | POA: Diagnosis not present

## 2016-06-05 LAB — URINALYSIS, ROUTINE W REFLEX MICROSCOPIC
Bilirubin Urine: NEGATIVE
Glucose, UA: NEGATIVE mg/dL
Ketones, ur: 15 mg/dL — AB
NITRITE: POSITIVE — AB
PROTEIN: NEGATIVE mg/dL
Specific Gravity, Urine: 1.026 (ref 1.005–1.030)
pH: 5.5 (ref 5.0–8.0)

## 2016-06-05 LAB — URINE MICROSCOPIC-ADD ON

## 2016-06-05 MED ORDER — CEPHALEXIN 500 MG PO CAPS: 500.0000 mg | ORAL_CAPSULE | Freq: Two times a day (BID) | ORAL | 0 refills | Status: AC

## 2016-06-05 MED ORDER — CEPHALEXIN 250 MG PO CAPS
500.0000 mg | ORAL_CAPSULE | Freq: Once | ORAL | Status: AC
  Administered 2016-06-05: 500 mg via ORAL
  Filled 2016-06-05: qty 2

## 2016-06-05 MED ORDER — HYDROCODONE-ACETAMINOPHEN 5-325 MG PO TABS: 1.0000 | ORAL_TABLET | Freq: Four times a day (QID) | ORAL | 0 refills | Status: DC | PRN

## 2016-06-05 MED ORDER — ACETAMINOPHEN 500 MG PO TABS
500.0000 mg | ORAL_TABLET | Freq: Once | ORAL | Status: AC
  Administered 2016-06-05: 500 mg via ORAL
  Filled 2016-06-05: qty 1

## 2016-06-05 NOTE — ED Notes (Signed)
Patient transported to X-ray 

## 2016-06-05 NOTE — ED Triage Notes (Signed)
Pt reports she fell Saturday. Pt denies blood thinner usage. Pt reports she just stumbled. Pt has black eye and c/o left shoulder pain.

## 2016-06-05 NOTE — ED Notes (Signed)
Pt wheeled back to room from waiting room. 

## 2016-06-05 NOTE — ED Provider Notes (Signed)
[facility]-EMERGENCY DEPT Provider Note   CSN: [id number] Arrival date & time: 06/05/16  1155     History   Chief Complaint Chief Complaint  Patient presents with  . Fall    HPI Katherine Flynn is a 82 y.o. female.  The history is provided by the patient.  Fall  This is a new problem. The current episode started 2 days ago (Saturday). The problem occurs rarely (once). The problem has been resolved. Pertinent negatives include no chest pain, no abdominal pain, no headaches and no shortness of breath. Exacerbated by: movements of shoulder. The symptoms are relieved by rest (immobilization of shoulder).    Past Medical History:  Diagnosis Date  . Chronic pain syndrome   . COPD (chronic obstructive pulmonary disease) (HCC)   . GERD (gastroesophageal reflux disease)   . History of pyloric stenosis   . Hypertension   . Macular degeneration   . Osteoarthritis   . Thyroid disease   . Weight loss     Patient Active Problem List   Diagnosis Date Noted  . UTI (lower urinary tract infection) 05/01/2016  . Dehydration 05/01/2016  . Multiple pelvic fractures (HCC) 11/04/2015  . Fall at home 11/04/2015  . Prolonged Q-T interval on ECG 11/04/2015  . RBBB 11/04/2015  . Dilated bile duct 11/04/2015  . Pancreatic duct dilated (HCC) 11/04/2015  . Abnormal CT of the abdomen 11/04/2015  . Fall 04/19/2015  . Protein-calorie malnutrition, severe (HCC) 04/19/2015  . Closed fracture of right iliac wing (HCC) 04/18/2015  . Peri-prosthetic fracture around prosthetic hip (HCC) 04/18/2015  . Protein calorie malnutrition (HCC) 04/18/2015  . Hypokalemia 11/24/2014  . Iron deficiency anemia 12/30/2012  . Chronic bronchitis with COPD (chronic obstructive pulmonary disease) (HCC) 11/19/2007  . UTI'S, RECURRENT 11/19/2007  . HEEL SPUR 11/19/2007  . Hypothyroidism 11/04/2007  . Essential hypertension 11/04/2007  . GERD 11/04/2007  . Osteoarthritis 11/04/2007  . WEIGHT LOSS 11/04/2007  .  PYLORIC STENOSIS 07/18/2007  . OTHER OBSTRUCTION OF DUODENUM 08/10/2006    Past Surgical History:  Procedure Laterality Date  . APPENDECTOMY    . CATARACT EXTRACTION    . nissan fundoplication  08/2007  . ORIF HIP FRACTURE    . TUBAL LIGATION      OB History    Gravida Para Term Preterm AB Living   12 9     3      SAB TAB Ectopic Multiple Live Births                   Home Medications    Prior to Admission medications   Medication Sig Start Date End Date Taking? Authorizing Provider  cephALEXin (KEFLEX) 500 MG capsule Take 1 capsule (500 mg total) by mouth 2 (two) times daily. 06/05/16 06/15/16  Horald Pollen, MD  HYDROcodone-acetaminophen (NORCO/VICODIN) 5-325 MG tablet Take 1 tablet by mouth every 6 (six) hours as needed for severe pain (try taking 1/2 tablet at first. If tolerates okay, can try full tablet). 06/05/16 06/10/16  Horald Pollen, MD  levothyroxine (SYNTHROID, LEVOTHROID) 100 MCG tablet Take 1 tablet (100 mcg total) by mouth daily. 04/28/16   Gordy Savers, MD  metoprolol tartrate (LOPRESSOR) 25 MG tablet Take 1 tablet (25 mg total) by mouth 2 (two) times daily. 04/28/16   Gordy Savers, MD  Multiple Vitamin (MULTIVITAMIN WITH MINERALS) TABS tablet Take 1 tablet by mouth daily.    Historical Provider, MD    Family History History reviewed. No pertinent family history.  Social  History Social History  Substance Use Topics  . Smoking status: Never Smoker  . Smokeless tobacco: Never Used  . Alcohol use No     Allergies   Review of patient's allergies indicates no known allergies.   Review of Systems Review of Systems  Constitutional: Positive for activity change. Negative for appetite change and fever.  HENT: Negative for congestion, sore throat, trouble swallowing and voice change.   Eyes: Negative for pain, redness and visual disturbance.  Respiratory: Negative for shortness of breath.   Cardiovascular: Negative for chest pain and leg  swelling.  Gastrointestinal: Negative for abdominal pain, nausea and vomiting.  Genitourinary: Positive for flank pain.       Per daughter, pt was complaining of flank pain, pt denies currently  Musculoskeletal: Positive for arthralgias and back pain. Negative for neck pain and neck stiffness.       C/o left shoulder pain and right knee pain  Skin: Negative for rash and wound.  Neurological: Negative for dizziness, tremors, speech difficulty, weakness, light-headedness, numbness and headaches.  Psychiatric/Behavioral: Negative for confusion.     Physical Exam Updated Vital Signs BP 145/82   Pulse 93   Temp 97.6 F (36.4 C) (Oral)   Resp 14   SpO2 98%   Physical Exam  Constitutional: She is oriented to person, place, and time. She appears well-developed and well-nourished. No distress.  Thin, elderly, but non-toxic appearing. Pleasant, cooperative, mentating well  HENT:  Head: Normocephalic.  Right Ear: External ear normal.  Left Ear: External ear normal.  Mouth/Throat: Oropharynx is clear and moist.  Ecchymosis to left eye. Negative battle's sign. No facial TTP  Eyes: Conjunctivae and EOM are normal. Pupils are equal, round, and reactive to light. No scleral icterus.  Neck: Normal range of motion. Neck supple. No tracheal deviation present.  No c-spine ttp  Cardiovascular: Normal rate, regular rhythm and intact distal pulses.   Murmur heard. Pulmonary/Chest: Effort normal. No respiratory distress. She exhibits no tenderness.  Mild rales b/l  Abdominal: Soft. She exhibits no distension. There is no tenderness.  No abdominal or flank ecchymoses or hematomas. No CVA TTP  Musculoskeletal: Normal range of motion. She exhibits no edema, tenderness or deformity.  Pain with active ROM of left shoulder, without focal ttp. Ecchymosis of right knee with mild effusion, no TTP. Full active ROM of right knee without significant pain.  Neurological: She is alert and oriented to person,  place, and time. No cranial nerve deficit. She exhibits normal muscle tone. Coordination normal.  Symmetric strength of b/l Ue's and b/l Le's. Normal speech and mentation. No ataxia or dysmetria  Skin: Skin is warm and dry. No rash noted. She is not diaphoretic.  No wounds.  Psychiatric: She has a normal mood and affect.  Nursing note and vitals reviewed.    ED Treatments / Results  Labs (all labs ordered are listed, but only abnormal results are displayed) Labs Reviewed  URINALYSIS, ROUTINE W REFLEX MICROSCOPIC (NOT AT [facility]) - Abnormal; Notable for the following:       Result Value   APPearance CLOUDY (*)    Hgb urine dipstick SMALL (*)    Ketones, ur 15 (*)    Nitrite POSITIVE (*)    Leukocytes, UA SMALL (*)    All other components within normal limits  URINE MICROSCOPIC-ADD ON - Abnormal; Notable for the following:    Squamous Epithelial / LPF 0-5 (*)    Bacteria, UA MANY (*)    Casts HYALINE CASTS (*)  All other components within normal limits  URINE CULTURE    EKG  EKG Interpretation  Date/Time:  Monday June 05 2016 17:15:27 EDT Ventricular Rate:  92 PR Interval:    QRS Duration: 130 QT Interval:  427 QTC Calculation: 529 R Axis:   77 Text Interpretation:  Sinus rhythm Biatrial enlargement Right bundle branch block No significant change since last tracing Confirmed by LITTLE MD, RACHEL [phone]) on 06/05/2016 5:18:06 PM       Radiology Dg Chest 2 View  Result Date: 06/05/2016 CLINICAL DATA:  Larey Seat on the floor at home 2 days ago, right knee pain EXAM: CHEST  2 VIEW COMPARISON:  04/20/2015 FINDINGS: Cardiomediastinal silhouette is stable. No acute infiltrate or pleural effusion. No pulmonary edema. No gross fractures are noted. Degenerative changes bilateral shoulders. Atherosclerotic calcifications of thoracic aorta. Osteopenia and mild degenerative changes thoracic spine. IMPRESSION: No active disease.  Degenerative changes bilateral shoulders. Electronically  Signed   By: Natasha Mead M.D.   On: 06/05/2016 17:02   Dg Thoracic Spine 2 View  Result Date: 06/05/2016 CLINICAL DATA:  Fall at home on carpeted floor 2 days ago now with mid thoracic spine pain. EXAM: THORACIC SPINE 2 VIEWS COMPARISON:  Lumbar spine radiographs-earlier same day Chest radiograph - 08/13/2007; 07/04/2005 FINDINGS: Re- demonstrated mild presumably degenerative scoliotic curvature of the caudal aspect of the thoracic spine. No anterolisthesis or retrolisthesis. Age-indeterminate mild (under 25%) compression deformity involving the T12 vertebral body. Remaining thoracic vertebral body heights appear preserved given obliquity. Thoracic intervertebral disc space heights appear preserved given obliquity. Atherosclerotic plaque within the thoracic aorta. Limited visualization of the adjacent thorax is normal. Regional soft tissues appear normal. IMPRESSION: 1. Age-indeterminate mild (< 25%) compression deformity involving the T12 vertebral body. Correlation for point tenderness at this location is recommended. 2.  Aortic Atherosclerosis (ICD10-170.0) Electronically Signed   By: Simonne Come M.D.   On: 06/05/2016 17:05   Dg Lumbar Spine Complete  Result Date: 06/05/2016 CLINICAL DATA:  Post fall and bedroom on carpeted for 2 days ago, now with mid thoracic spine pain. EXAM: LUMBAR SPINE - COMPLETE 4+ VIEW COMPARISON:  Thoracic spine radiographs - earlier same day ; CT abdomen pelvis -11/04/2015 FINDINGS: There are 5 non rib-bearing lumbar type vertebral bodies. Re- demonstrated moderate scoliotic curvature of the lumbar spine, convex the left, measuring approximately 20 degrees (as measured from the superior endplate of L1 to the inferior endplate of L4). There is straightening and reversal of the expected lumbar lordosis with mild kyphosis centered about the L2-L3 articulation, unchanged. Mild (under 25%) compression deformity involving the superior endplate of the T12 vertebral body. Remaining  lumbar vertebral body heights appear preserved given obliquity. Moderate to severe multilevel lumbar spine DDD, worse at L2-L3 with disc space height loss, endplate irregularity and sclerosis. Extensive calcified atherosclerotic plaque within the abdominal aorta. Post right total hip replacement, incompletely evaluated. Regional bowel gas pattern is normal. IMPRESSION: 1. Mild (< 25%) compression deformity involving the superior endplate of T12. Correlation for point tenderness at this location is recommended. 2. Moderate to severe multilevel lumbar spine DDD, worse at L2-L3. 3.  Aortic Atherosclerosis (ICD10-170.0) Electronically Signed   By: Simonne Come M.D.   On: 06/05/2016 17:09   Dg Knee 2 Views Right  Result Date: 06/05/2016 CLINICAL DATA:  Larey Seat 2 days ago.  Persistent right knee pain. EXAM: RIGHT KNEE - 1-2 VIEW COMPARISON:  None. FINDINGS: Mild degenerative changes but no acute fracture. Moderate osteoporosis. Extensive vascular calcifications. No joint  effusion. IMPRESSION: No acute fracture. Electronically Signed   By: Rudie MeyerP.  Gallerani M.D.   On: 06/05/2016 17:04   Ct Head Wo Contrast  Result Date: 06/05/2016 CLINICAL DATA:  Right-sided head injury from fall this past Saturday with bruising around the right orbit. No loss of consciousness. EXAM: CT HEAD WITHOUT CONTRAST CT CERVICAL SPINE WITHOUT CONTRAST TECHNIQUE: Multidetector CT imaging of the head and cervical spine was performed following the standard protocol without intravenous contrast. Multiplanar CT image reconstructions of the cervical spine were also generated. COMPARISON:  Head CT - 11/04/2015 ; head and C-spine CT - 02/10/2015 FINDINGS: CT HEAD FINDINGS Brain: Re-demonstrated advanced atrophy with sulcal prominence and centralized volume loss with commensurate ex vacuo dilatation of the ventricular system. Rather extensive, nearly confluent periventricular hypodensities compatible with microvascular ischemic disease. Given background  parenchymal abnormalities, there is no CT evidence of superimposed acute large territory infarct. No intraparenchymal or intra-axial mass or hemorrhage. Vascular: Intracranial atherosclerosis Skull: No displaced calvarial fracture. Sinuses/Orbits: There is under pneumatization of the left frontal sinus. The remaining paranasal sinuses and mastoid air cells are normally aerated. No air-fluid levels. A minimal amount of debris is noted within the right external auditory canal. Post bilateral cataract surgery. Other: Regional soft tissues appear normal with special attention paid to the right orbit. CT CERVICAL SPINE FINDINGS Alignment: C1 to the superior endplate of T3 is imaged. Re- demonstrated mild (approximately 4 mm) of anterolisthesis of C3 upon C4, similar to the 02/2015 examination. The bilateral facets are normally aligned. The dens is normally positioned between the lateral masses of C1. Normal atlantodental and toe axial articulations. Skull base and vertebrae: Cervical vertebral body heights are preserved. Soft tissues and spinal canal: Prevertebral soft tissues are normal. Disc levels: Moderate to severe multilevel cervical spine DDD, worse at C3-C4, C4-C5, C5-C6 and T1-T2 with disc space height loss, endplate irregularity and sclerosis. Small posteriorly disc osteophyte complexes are seen at C3-C4, C4-C5 and C5-C6. Upper chest: Limited visualization of the lung apices demonstrates biapical pleural calcifications, unchanged. Other: Calcified atherosclerotic plaque within the bilateral carotid bulbs. Note is made of a punctate (approximately 0.7 cm peripherally calcified nodule within the right lobe of the thyroid. No bulky cervical lymphadenopathy on this noncontrast examination. IMPRESSION: 1. Similar findings of advanced atrophy and microvascular ischemic disease without acute intracranial process. 2. No fracture or static subluxation of the cervical spine. 3. Moderate to severe multilevel cervical  spine DDD. 4. Mild (approximately 4 mm) of grade 1 anterolisthesis of C3 upon C4, similar to the 02/2015 examination Electronically Signed   By: Simonne ComeJohn  Watts M.D.   On: 06/05/2016 17:27   Ct Cervical Spine Wo Contrast  Result Date: 06/05/2016 CLINICAL DATA:  Right-sided head injury from fall this past Saturday with bruising around the right orbit. No loss of consciousness. EXAM: CT HEAD WITHOUT CONTRAST CT CERVICAL SPINE WITHOUT CONTRAST TECHNIQUE: Multidetector CT imaging of the head and cervical spine was performed following the standard protocol without intravenous contrast. Multiplanar CT image reconstructions of the cervical spine were also generated. COMPARISON:  Head CT - 11/04/2015 ; head and C-spine CT - 02/10/2015 FINDINGS: CT HEAD FINDINGS Brain: Re-demonstrated advanced atrophy with sulcal prominence and centralized volume loss with commensurate ex vacuo dilatation of the ventricular system. Rather extensive, nearly confluent periventricular hypodensities compatible with microvascular ischemic disease. Given background parenchymal abnormalities, there is no CT evidence of superimposed acute large territory infarct. No intraparenchymal or intra-axial mass or hemorrhage. Vascular: Intracranial atherosclerosis Skull: No displaced calvarial fracture.  Sinuses/Orbits: There is under pneumatization of the left frontal sinus. The remaining paranasal sinuses and mastoid air cells are normally aerated. No air-fluid levels. A minimal amount of debris is noted within the right external auditory canal. Post bilateral cataract surgery. Other: Regional soft tissues appear normal with special attention paid to the right orbit. CT CERVICAL SPINE FINDINGS Alignment: C1 to the superior endplate of T3 is imaged. Re- demonstrated mild (approximately 4 mm) of anterolisthesis of C3 upon C4, similar to the 02/2015 examination. The bilateral facets are normally aligned. The dens is normally positioned between the lateral  masses of C1. Normal atlantodental and toe axial articulations. Skull base and vertebrae: Cervical vertebral body heights are preserved. Soft tissues and spinal canal: Prevertebral soft tissues are normal. Disc levels: Moderate to severe multilevel cervical spine DDD, worse at C3-C4, C4-C5, C5-C6 and T1-T2 with disc space height loss, endplate irregularity and sclerosis. Small posteriorly disc osteophyte complexes are seen at C3-C4, C4-C5 and C5-C6. Upper chest: Limited visualization of the lung apices demonstrates biapical pleural calcifications, unchanged. Other: Calcified atherosclerotic plaque within the bilateral carotid bulbs. Note is made of a punctate (approximately 0.7 cm peripherally calcified nodule within the right lobe of the thyroid. No bulky cervical lymphadenopathy on this noncontrast examination. IMPRESSION: 1. Similar findings of advanced atrophy and microvascular ischemic disease without acute intracranial process. 2. No fracture or static subluxation of the cervical spine. 3. Moderate to severe multilevel cervical spine DDD. 4. Mild (approximately 4 mm) of grade 1 anterolisthesis of C3 upon C4, similar to the 02/2015 examination Electronically Signed   By: Simonne Come M.D.   On: 06/05/2016 17:27   Dg Shoulder Left  Result Date: 06/05/2016 CLINICAL DATA:  Fall Saturday, left shoulder pain. EXAM: LEFT SHOULDER - 2+ VIEW COMPARISON:  None. FINDINGS: Osseous structures are diffusely osteopenic. This limits characterization of osseous detail but there is no discrete fracture line or displaced fracture fragment seen. No osseous dislocation. Advanced degenerative narrowing noted at the glenohumeral joint space, with near abutment of the humeral head and glenoid fossa. Associated mild degenerative spurring. Additional mild degenerative spurring noted at the left [facility] joint. IMPRESSION: 1. No acute findings. 2. Degenerative changes, as detailed above. Electronically Signed   By: Bary Richard M.D.   On:  06/05/2016 12:58    Procedures Procedures (including critical care time)  Medications Ordered in ED Medications  cephALEXin (KEFLEX) capsule 500 mg (not administered)  acetaminophen (TYLENOL) tablet 500 mg (500 mg Oral Given 06/05/16 1716)     Initial Impression / Assessment and Plan / ED Course  I have reviewed the triage vital signs and the nursing notes.  Pertinent labs & imaging results that were available during my care of the patient were reviewed by me and considered in my medical decision making (see chart for details).  Clinical Course   BROOKELYN GAYNOR is a 82 y.o. female who presented to ED via private vehicle with daughter for evaluation of left shoulder pain, ecchymosis to right eye, right knee pain, and non-focal back pain since GLF while getting up in dark Saturday night to ambulate toward bathroom. Fall seems to be mechanical trip, though unclear. Pt denies associated chest pain or discomfort at the time, denies lightheadedness or dizziness related to the fall. Pt lives independently. Denies LOC. No focal neurologic deficits. Imaging as per above, demonstrates age-indeterminate compression fx of T12 with <25% height loss. Suspect this is new, as pt has mid back pain (though no focal TTP), worse with  twisting. Shared decision making with pt and family about whether or not to obtain CT imaging and/or brace. They opt not to obtain CT scan, pt wants to be discharged home, does not want further workup at this time, a brace, or a hospital stay. Advised to return to ER for any numbness/weakness of arms/legs, for fevers, not acting like herself, or any other new, worse, or concerning symptoms. As pt will be staying with her daughter over the next few days, Rx Norco and advised to not let pt ambulate on her own for several hours after taking. Advised to start out with 1/2 tablet to see how she tolerates. Advised do not take tylenol at same time, as norco has tylenol in it. They  demonstrate understanding of this and desire for d/c home. Rx Keflex for UTI, cx's sent.  Pt condition, course, and discharge were discussed with attending physician Dr. Frederick Peers.  Final Clinical Impressions(s) / ED Diagnoses   Final diagnoses:  Facial contusion, initial encounter  Fall at home, subsequent encounter  T12 compression fracture, initial encounter [facility])  UTI (lower urinary tract infection)    New Prescriptions New Prescriptions   CEPHALEXIN (KEFLEX) 500 MG CAPSULE    Take 1 capsule (500 mg total) by mouth 2 (two) times daily.   HYDROCODONE-ACETAMINOPHEN (NORCO/VICODIN) 5-325 MG TABLET    Take 1 tablet by mouth every 6 (six) hours as needed for severe pain (try taking 1/2 tablet at first. If tolerates okay, can try full tablet).     Horald Pollen, MD 06/05/16 1809    Laurence Spates, MD 06/07/16 [phone]

## 2016-06-05 NOTE — ED Notes (Signed)
Pt and family stating they do not want patient to have CT scan of thoracic spine, Dr. Mayford KnifeWilliams and Dr. Clarene DukeLittle made aware

## 2016-06-06 LAB — URINE CULTURE

## 2016-07-14 ENCOUNTER — Telehealth: Payer: Self-pay | Admitting: Internal Medicine

## 2016-07-14 MED ORDER — HYDROCODONE-ACETAMINOPHEN 5-325 MG PO TABS: 1.0000 | ORAL_TABLET | Freq: Four times a day (QID) | ORAL | 0 refills | Status: DC | PRN

## 2016-07-14 NOTE — Telephone Encounter (Signed)
Pt would like rx  hydrocodone

## 2016-07-14 NOTE — Telephone Encounter (Signed)
Dr. Kirtland BouchardK, pt calling to request Hydrocodone pain medication. Pt was seen in the ED on 9/25 and was suppose to follow up with PCP but has not. Please advise.

## 2016-07-14 NOTE — Telephone Encounter (Signed)
Spoke to pt, told her I have Rx for Hydrocodone ready for pickup will be at the front desk. Pt verbalized understanding. Rx printed and signed.

## 2016-07-14 NOTE — Telephone Encounter (Signed)
Okay to refill? 

## 2016-07-17 ENCOUNTER — Encounter: Payer: Self-pay | Admitting: Family Medicine

## 2016-07-17 ENCOUNTER — Ambulatory Visit (INDEPENDENT_AMBULATORY_CARE_PROVIDER_SITE_OTHER): Payer: Commercial Managed Care - HMO | Admitting: Family Medicine

## 2016-07-17 VITALS — BP 128/80 | HR 80 | Temp 97.9°F | Ht 60.0 in | Wt 91.6 lb

## 2016-07-17 DIAGNOSIS — R350 Frequency of micturition: Secondary | ICD-10-CM | POA: Diagnosis not present

## 2016-07-17 DIAGNOSIS — R197 Diarrhea, unspecified: Secondary | ICD-10-CM

## 2016-07-17 NOTE — Progress Notes (Signed)
Pre visit review using our clinic review tool, if applicable. No additional management support is needed unless otherwise documented below in the visit note. 

## 2016-07-17 NOTE — Progress Notes (Signed)
HPI:  Katherine Flynn is a pleasant 82 year old here with her caregiver today for an acute visit for dysuria. According to the patient and caregiver she had one day of mild diarrhea last week, some urinary frequency that day and a question of a streak of blood when she wiped. She thought that this was from the urine. She has had no symptoms since. She denies abdominal pain, persistent diarrhea, persistent dysuria, nausea, vomiting, fevers or any further signs of bleeding hematuria, melena or hematochezia.  ROS: See pertinent positives and negatives per HPI.  Past Medical History:  Diagnosis Date  . Chronic pain syndrome   . COPD (chronic obstructive pulmonary disease) (HCC)   . GERD (gastroesophageal reflux disease)   . History of pyloric stenosis   . Hypertension   . Macular degeneration   . Osteoarthritis   . Thyroid disease   . Weight loss     Past Surgical History:  Procedure Laterality Date  . APPENDECTOMY    . CATARACT EXTRACTION    . nissan fundoplication  08/2007  . ORIF HIP FRACTURE    . TUBAL LIGATION      No family history on file.  Social History   Social History  . Marital status: Widowed    Spouse name: N/A  . Number of children: N/A  . Years of education: N/A   Social History Main Topics  . Smoking status: Never Smoker  . Smokeless tobacco: Never Used  . Alcohol use No  . Drug use: No  . Sexual activity: Not Asked   Other Topics Concern  . None   Social History Narrative  . None     Current Outpatient Prescriptions:  .  HYDROcodone-acetaminophen (NORCO/VICODIN) 5-325 MG tablet, Take 1 tablet by mouth every 6 (six) hours as needed for severe pain., Disp: 60 tablet, Rfl: 0 .  levothyroxine (SYNTHROID, LEVOTHROID) 100 MCG tablet, Take 1 tablet (100 mcg total) by mouth daily., Disp: 90 tablet, Rfl: 4 .  metoprolol tartrate (LOPRESSOR) 25 MG tablet, Take 1 tablet (25 mg total) by mouth 2 (two) times daily., Disp: 180 tablet, Rfl: 4 .  Multiple  Vitamin (MULTIVITAMIN WITH MINERALS) TABS tablet, Take 1 tablet by mouth daily., Disp: , Rfl:   EXAM:  Vitals:   07/17/16 1101  BP: 128/80  Pulse: 80  Temp: 97.9 F (36.6 C)    Body mass index is 17.89 kg/m.  GENERAL: vitals reviewed and listed above, alert, oriented, appears well hydrated and in no acute distress  HEENT: atraumatic, conjunttiva clear, no obvious abnormalities on inspection of external nose and ears  NECK: no obvious masses on inspection  LUNGS: clear to auscultation bilaterally, no wheezes, rales or rhonchi, good air movement  CV: HRRR, no peripheral edema  MS: moves all extremities without noticeable abnormality  PSYCH: pleasant and cooperative, no obvious depression or anxiety  ASSESSMENT AND PLAN:  Discussed the following assessment and plan:  Urinary frequency  Diarrhea, unspecified type  -We'll check a urine dip today per other concerns  -Asymptomatic today and no symptoms in one week, if urine looks okay,  we will observe and advised follow-up with PCP if any recurrent symptoms  -Patient advised to return or notify a doctor immediately if symptoms worsen or persist or new concerns arise.  Addendum: she missed the cup/hat for urine collection and since has not had any symptoms recently, she and son opted to return if any further symptoms rather then to attempt another collection, cath urine or take home  supplies for urine collection at home. Declined avs.  There are no Patient Instructions on file for this visit.  Kriste BasqueKIM, Rahiem Schellinger R., DO

## 2016-10-09 ENCOUNTER — Telehealth: Payer: Self-pay | Admitting: Internal Medicine

## 2016-10-09 NOTE — Telephone Encounter (Signed)
[facility] - Client TELEPHONE ADVICE RECORD [facility] Patient Name: Regis BillBARBARA BOTTOMLE Y DOB: 12/07/1922 Initial Comment Caller states mother is having chest pain and trouble breathing Nurse Assessment Nurse: Debera Latalston, RN, Tinnie GensJeffrey Date/Time Lamount Cohen(Eastern Time): 10/09/2016 9:20:11 AM Confirm and document reason for call. If symptomatic, describe symptoms. ---Caller states mother is having chest pain and trouble breathing. Sharp pain when breathing in. No fever. Does the patient have any new or worsening symptoms? ---Yes Will a triage be completed? ---Yes Related visit to physician within the last 2 weeks? ---No Does the PT have any chronic conditions? (i.e. diabetes, asthma, etc.) ---No Is this a behavioral health or substance abuse call? ---No Guidelines Guideline Title Affirmed Question Affirmed Notes Chest Pain Taking a deep breath makes pain worse Final Disposition User Go to ED Now (or PCP triage) Debera Latalston, RN, Tinnie GensJeffrey [facility] Disagree/Comply: Comply

## 2016-10-09 NOTE — Telephone Encounter (Signed)
Pt's caregiver called to request if dr could call in an order for a chest xray so pt does not have to go to the ED. Son is still at work, doesn't get off until 5 pm. Would like to go to [facility]. They are concerned for pt to go and sit in the ED.

## 2016-10-09 NOTE — Telephone Encounter (Signed)
Pt has not checked in to ED yet. ATC pt's son x3 with no answer and phone has no voicemail set up. WCB

## 2016-10-09 NOTE — Telephone Encounter (Signed)
Spokw with pts care giver and informed her that patient needs medical evaluation as well as chest x-ray.  Consider evaluation at urgent care, which may be faster than Ed. Caregiver verbalized understanding and stated pt son would take her to urgent care

## 2016-10-09 NOTE — Telephone Encounter (Signed)
See message below, please advise.

## 2016-10-09 NOTE — Telephone Encounter (Signed)
Patient needs medical evaluation as well as chest x-ray.  Consider evaluation at urgent care, which may be faster than ED

## 2016-10-11 ENCOUNTER — Ambulatory Visit (INDEPENDENT_AMBULATORY_CARE_PROVIDER_SITE_OTHER): Payer: Medicare HMO

## 2016-10-11 ENCOUNTER — Ambulatory Visit (INDEPENDENT_AMBULATORY_CARE_PROVIDER_SITE_OTHER): Payer: Medicare HMO | Admitting: Family Medicine

## 2016-10-11 VITALS — BP 122/80 | HR 93 | Temp 98.5°F | Resp 16 | Ht 59.0 in | Wt 94.0 lb

## 2016-10-11 DIAGNOSIS — R05 Cough: Secondary | ICD-10-CM

## 2016-10-11 DIAGNOSIS — R079 Chest pain, unspecified: Secondary | ICD-10-CM

## 2016-10-11 DIAGNOSIS — S299XXA Unspecified injury of thorax, initial encounter: Secondary | ICD-10-CM | POA: Diagnosis not present

## 2016-10-11 DIAGNOSIS — R059 Cough, unspecified: Secondary | ICD-10-CM

## 2016-10-11 DIAGNOSIS — J189 Pneumonia, unspecified organism: Secondary | ICD-10-CM

## 2016-10-11 DIAGNOSIS — R69 Illness, unspecified: Secondary | ICD-10-CM | POA: Diagnosis not present

## 2016-10-11 LAB — POCT CBC
GRANULOCYTE PERCENT: 79.4 % (ref 37–80)
HEMATOCRIT: 38.9 % (ref 37.7–47.9)
Hemoglobin: 13.5 g/dL (ref 12.2–16.2)
Lymph, poc: 1.7 (ref 0.6–3.4)
MCH: 29.4 pg (ref 27–31.2)
MCHC: 34.8 g/dL (ref 31.8–35.4)
MCV: 84.7 fL (ref 80–97)
MID (CBC): 0.5 (ref 0–0.9)
MPV: 6.7 fL (ref 0–99.8)
POC GRANULOCYTE: 8.5 — AB (ref 2–6.9)
POC LYMPH PERCENT: 16.2 %L (ref 10–50)
POC MID %: 4.4 % (ref 0–12)
Platelet Count, POC: 343 10*3/uL (ref 142–424)
RBC: 4.59 M/uL (ref 4.04–5.48)
RDW, POC: 14.7 %
WBC: 10.7 10*3/uL — AB (ref 4.6–10.2)

## 2016-10-11 MED ORDER — ALBUTEROL SULFATE HFA 108 (90 BASE) MCG/ACT IN AERS: 2.0000 | INHALATION_SPRAY | Freq: Four times a day (QID) | RESPIRATORY_TRACT | 0 refills | Status: DC | PRN

## 2016-10-11 MED ORDER — AZITHROMYCIN 250 MG PO TABS: ORAL_TABLET | ORAL | 0 refills | Status: DC

## 2016-10-11 NOTE — Progress Notes (Signed)
Katherine Flynn is a 83 y.o. female who [facility] today for chest pain and cough:  1.  Chest pain and cough:  Patient with chest pain and cough for the past 3-4 days. She had a fall about 2 weeks ago where she fell on her left side. This was at home. It was a witnessed fall. She did hit her head and has had some bruising but this is healing. No confusion afterwards. No headaches or postconcussive symptoms.  About a week after her fall she had left-sided chest pain. She still has some residual pain but it is nowhere near as painful as it was on her left side. However as noted above about 3 days ago she began having increasing cough and pleuritic pain when she coughs. She states she does not have pain in her chest if she is not coughing. She's had no dyspnea on exertion. No diaphoresis. No nausea vomiting. She's had no URI symptoms.  No personal history of cardiac disease.    No abdominal pain. No diarrhea. She has very little past history despite the fact she is 83 years old. Her son is present here today. His name is Public house managerDean. He states that she is very healthy. They wanted to get a chest x-ray to see if she had a broken rib.  ROS as above.  Pertinently, no chest pain, palpitations, SOB, Fever, Chills, Abd pain, N/V/D.   PMH reviewed. Patient is a nonsmoker.   Past Medical History:  Diagnosis Date  . Chronic pain syndrome   . COPD (chronic obstructive pulmonary disease) (HCC)   . GERD (gastroesophageal reflux disease)   . History of pyloric stenosis   . Hypertension   . Macular degeneration   . Osteoarthritis   . Thyroid disease   . Weight loss    Past Surgical History:  Procedure Laterality Date  . APPENDECTOMY    . CATARACT EXTRACTION    . JOINT REPLACEMENT    . nissan fundoplication  08/2007  . ORIF HIP FRACTURE    . TUBAL LIGATION      Medications reviewed. Current Outpatient Prescriptions  Medication Sig Dispense Refill  . levothyroxine  (SYNTHROID, LEVOTHROID) 100 MCG tablet Take 1 tablet (100 mcg total) by mouth daily. 90 tablet 4  . metoprolol tartrate (LOPRESSOR) 25 MG tablet Take 1 tablet (25 mg total) by mouth 2 (two) times daily. 180 tablet 4  . Multiple Vitamin (MULTIVITAMIN WITH MINERALS) TABS tablet Take 1 tablet by mouth daily.     No current facility-administered medications for this visit.      Physical Exam:  BP 122/80 (BP Location: Right Arm, Patient Position: Sitting, Cuff Size: Small)   Pulse 93   Temp 98.5 F (36.9 C) (Oral)   Resp 16   Ht 4\' 11"  (1.499 m)   Wt 94 lb (42.6 kg)   SpO2 93%   BMI 18.99 kg/m  Gen:  Alert, cooperative patient who appears stated age in no acute distress.  Vital signs reviewed. HEENT: EOMI,  PERRL.  MMM Neck:  Supple.   Chest:  No bruising noted. She does have some tenderness to palpation at the left sternal border between ribs 3 and 4. Also some tenderness to palpation, mild, at mid clavicular line on Left, also ribs 3 and 4. Pulm:  Crackles BL bases.  No wheezing on my exam.   Cardiac:  Regular rate and rhythm without murmur auscultated.  Good S1/S2. Abd:  Soft/nondistended/nontender.  Exts: Non edematous BL  LE, warm and well perfused.   Results for orders placed or performed in visit on 10/11/16  POCT CBC  Result Value Ref Range   WBC 10.7 (A) 4.6 - 10.2 K/uL   Lymph, poc 1.7 0.6 - 3.4   POC LYMPH PERCENT 16.2 10 - 50 %L   MID (cbc) 0.5 0 - 0.9   POC MID % 4.4 0 - 12 %M   POC Granulocyte 8.5 (A) 2 - 6.9   Granulocyte percent 79.4 37 - 80 %G   RBC 4.59 4.04 - 5.48 M/uL   Hemoglobin 13.5 12.2 - 16.2 g/dL   HCT, POC 16.1 09.6 - 47.9 %   MCV 84.7 80 - 97 fL   MCH, POC 29.4 27 - 31.2 pg   MCHC 34.8 31.8 - 35.4 g/dL   RDW, POC 04.5 %   Platelet Count, POC 343 142 - 424 K/uL   MPV 6.7 0 - 99.8 fL     Assessment and Plan:  1.  Musculoskeletal chest pain: - no evidence of cardiac pain.  Reproducible on exam.   - Question of PNA -- see below   2.   Lingular PNA: - no diagnosis of COPD.  Does have hyperinflation on CXR as stigmata of COPD.  Radiologist read is the same - Treating with short-term course of azithromycin for possible lingular PNA -- this may be likely after her fall and difficulty taking deep breaths - Will fill short term use of albuterol inhaler to ensure no worsening cough and help open lungs BL.   3.  Fall: - mechanical, witnessed fall at home - only fall recently.  Bruising is improving.  No evidence of head bleed.  No confusion or red flags.

## 2016-10-11 NOTE — Patient Instructions (Addendum)
It was good to meet you today.  There is a question of pneumonia on her x-ray. I'm sending an antibiotic for you to take for this.  Continue taking the Tylenol for pain relief have been.  I have sent in inhaler for you to use every 4-6 hours for the next several days to help open up her lungs. Make sure that you're taking deep breaths clearing out her lungs.  If you not feeling better in the next week please let us know. Otherwise things look very good.  If you start having worsening chest pain, trouble breathing, nausea vomiting please come back immediately or go to the emergency department.    IF you received an x-ray today, you will receive an invoice from [facility]. Please contact [facility] at [phone] with questions or concerns regarding your invoice.   IF you received labwork today, you will receive an invoice from [facility]. Please contact [facility] at [phone] with questions or concerns regarding your invoice.   Our billing staff will not be able to assist you with questions regarding bills from these companies.  You will be contacted with the lab results as soon as they are available. The fastest way to get your results is to activate your My Chart account. Instructions are located on the last page of this paperwork. If you have not heard from us regarding the results in 2 weeks, please contact this office.

## 2016-10-25 ENCOUNTER — Encounter: Payer: Commercial Managed Care - HMO | Admitting: Internal Medicine

## 2016-12-19 ENCOUNTER — Encounter: Payer: Self-pay | Admitting: Family Medicine

## 2016-12-19 ENCOUNTER — Ambulatory Visit (INDEPENDENT_AMBULATORY_CARE_PROVIDER_SITE_OTHER): Payer: Medicare HMO | Admitting: Family Medicine

## 2016-12-19 VITALS — BP 130/80 | HR 84 | Temp 98.3°F | Wt 98.8 lb

## 2016-12-19 DIAGNOSIS — R1030 Lower abdominal pain, unspecified: Secondary | ICD-10-CM

## 2016-12-19 NOTE — Progress Notes (Signed)
Pre visit review using our clinic review tool, if applicable. No additional management support is needed unless otherwise documented below in the visit note. 

## 2016-12-19 NOTE — Progress Notes (Signed)
Subjective:     Patient ID: Katherine Flynn, female   DOB: 06-25-1923, 83 y.o.   MRN: [id number]  HPI Patient is an elderly female who is seen as a work in with apparently a least couple month history of nondescript bilateral lower abdominal pain. Patient is somewhat of a poor historian and her son is with her today. They relate she's had some decreased appetite. Her weight is actually up about 8 to 9 pounds from last November. She describes pain across her lower abdomen which is intermittent. No clear exacerbating or alleviating factors. She's had normal bowel movements. Pain is 6 out of 10 at its worst. Denies dysuria. No gross hematuria.  She had fall last year with pelvic fracture. CT abdomen pelvis in 2017 showed several nonspecific findings.  Past Medical History:  Diagnosis Date  . Chronic pain syndrome   . COPD (chronic obstructive pulmonary disease) (HCC)   . GERD (gastroesophageal reflux disease)   . History of pyloric stenosis   . Hypertension   . Macular degeneration   . Osteoarthritis   . Thyroid disease   . Weight loss    Past Surgical History:  Procedure Laterality Date  . APPENDECTOMY    . CATARACT EXTRACTION    . JOINT REPLACEMENT    . nissan fundoplication  08/2007  . ORIF HIP FRACTURE    . TUBAL LIGATION      reports that she has never smoked. She has never used smokeless tobacco. She reports that she does not drink alcohol or use drugs. family history is not on file. No Known Allergies   Review of Systems  Constitutional: Negative for appetite change, chills and fever.  Respiratory: Negative for cough and shortness of breath.   Cardiovascular: Negative for chest pain.  Gastrointestinal: Positive for abdominal pain. Negative for blood in stool, constipation, diarrhea, nausea and vomiting.  Genitourinary: Negative for dysuria.       Objective:   Physical Exam  Constitutional: She appears well-developed and well-nourished.  HENT:  Mouth/Throat:  Oropharynx is clear and moist.  Cardiovascular: Normal rate and regular rhythm.   Pulmonary/Chest: Effort normal and breath sounds normal. No respiratory distress. She has no wheezes. She has no rales.  Abdominal: Soft. There is no rebound and no guarding.  Minimally tender right upper quadrant to deep palpation She has some fullness to palpation RUQ-?stool.  No hepatomegaly.  Musculoskeletal: She exhibits no edema.  Neurological: She is alert.       Assessment:     Patient seen with apparently several month history of bilateral lower abdominal pain. She's not had any documented recent weight loss and weight has actually gone somewhat. She does not report any constipation recently.    Plan:     -Check labs - CBC, comprehensive metabolic panel, urine dipstick -Consider abdominal and pelvic CT to further assess  Kristian Covey MD [facility] at [facility]

## 2016-12-19 NOTE — Patient Instructions (Signed)

## 2016-12-20 LAB — CBC WITH DIFFERENTIAL/PLATELET
BASOS ABS: 0.1 10*3/uL (ref 0.0–0.1)
Basophils Relative: 0.7 % (ref 0.0–3.0)
Eosinophils Absolute: 0.5 10*3/uL (ref 0.0–0.7)
Eosinophils Relative: 5.6 % — ABNORMAL HIGH (ref 0.0–5.0)
HEMATOCRIT: 35.9 % — AB (ref 36.0–46.0)
HEMOGLOBIN: 11.6 g/dL — AB (ref 12.0–15.0)
LYMPHS PCT: 20.6 % (ref 12.0–46.0)
Lymphs Abs: 1.7 10*3/uL (ref 0.7–4.0)
MCHC: 32.3 g/dL (ref 30.0–36.0)
MCV: 88 fl (ref 78.0–100.0)
MONOS PCT: 5.1 % (ref 3.0–12.0)
Monocytes Absolute: 0.4 10*3/uL (ref 0.1–1.0)
NEUTROS ABS: 5.6 10*3/uL (ref 1.4–7.7)
Neutrophils Relative %: 68 % (ref 43.0–77.0)
PLATELETS: 448 10*3/uL — AB (ref 150.0–400.0)
RBC: 4.08 Mil/uL (ref 3.87–5.11)
RDW: 14.9 % (ref 11.5–15.5)
WBC: 8.2 10*3/uL (ref 4.0–10.5)

## 2016-12-20 LAB — COMPREHENSIVE METABOLIC PANEL
ALBUMIN: 3.9 g/dL (ref 3.5–5.2)
ALK PHOS: 102 U/L (ref 39–117)
ALT: 12 U/L (ref 0–35)
AST: 18 U/L (ref 0–37)
BILIRUBIN TOTAL: 0.3 mg/dL (ref 0.2–1.2)
BUN: 19 mg/dL (ref 6–23)
CALCIUM: 9.9 mg/dL (ref 8.4–10.5)
CO2: 30 mEq/L (ref 19–32)
Chloride: 102 mEq/L (ref 96–112)
Creatinine, Ser: 0.73 mg/dL (ref 0.40–1.20)
GFR: 79.35 mL/min (ref >60.00)
Glucose, Bld: 135 mg/dL — ABNORMAL HIGH (ref 70–99)
Potassium: 4.2 mEq/L (ref 3.5–5.1)
Sodium: 140 mEq/L (ref 135–145)
TOTAL PROTEIN: 6.5 g/dL (ref 6.0–8.3)

## 2016-12-29 ENCOUNTER — Inpatient Hospital Stay: Admission: RE | Admit: 2016-12-29 | Payer: Medicare HMO | Source: Ambulatory Visit

## 2017-01-04 ENCOUNTER — Ambulatory Visit (INDEPENDENT_AMBULATORY_CARE_PROVIDER_SITE_OTHER)
Admission: RE | Admit: 2017-01-04 | Discharge: 2017-01-04 | Disposition: A | Discharge status: Home / Self Care | Payer: Medicare HMO | Source: Ambulatory Visit | Attending: Family Medicine | Admitting: Family Medicine

## 2017-01-04 DIAGNOSIS — R109 Unspecified abdominal pain: Secondary | ICD-10-CM | POA: Diagnosis not present

## 2017-01-04 DIAGNOSIS — R1030 Lower abdominal pain, unspecified: Secondary | ICD-10-CM | POA: Diagnosis not present

## 2017-01-04 MED ORDER — IOPAMIDOL (ISOVUE-300) INJECTION 61%
80.0000 mL | Freq: Once | INTRAVENOUS | Status: AC | PRN
  Administered 2017-01-04: 80 mL via INTRAVENOUS

## 2017-01-10 ENCOUNTER — Telehealth: Payer: Self-pay | Admitting: Internal Medicine

## 2017-01-10 NOTE — Telephone Encounter (Signed)
Please advise 

## 2017-01-10 NOTE — Telephone Encounter (Signed)
Please call/notify patient that lab/test/procedure is normal 

## 2017-01-10 NOTE — Telephone Encounter (Signed)
Called and spoke with Pt's son at [phone] and informed August Saucer that the CT was normal

## 2017-01-10 NOTE — Telephone Encounter (Signed)
° ° ° °  Pt call and would like a call back about CT results she had last week

## 2017-02-28 IMAGING — DX DG CHEST 2V
2 series · 2 of 2 positions shown · non-contrast
Comparison: 04/20/2015

CLINICAL DATA: Fell on the floor at home 2 days ago, right knee
pain

EXAM:
CHEST  2 VIEW

[chest lat]
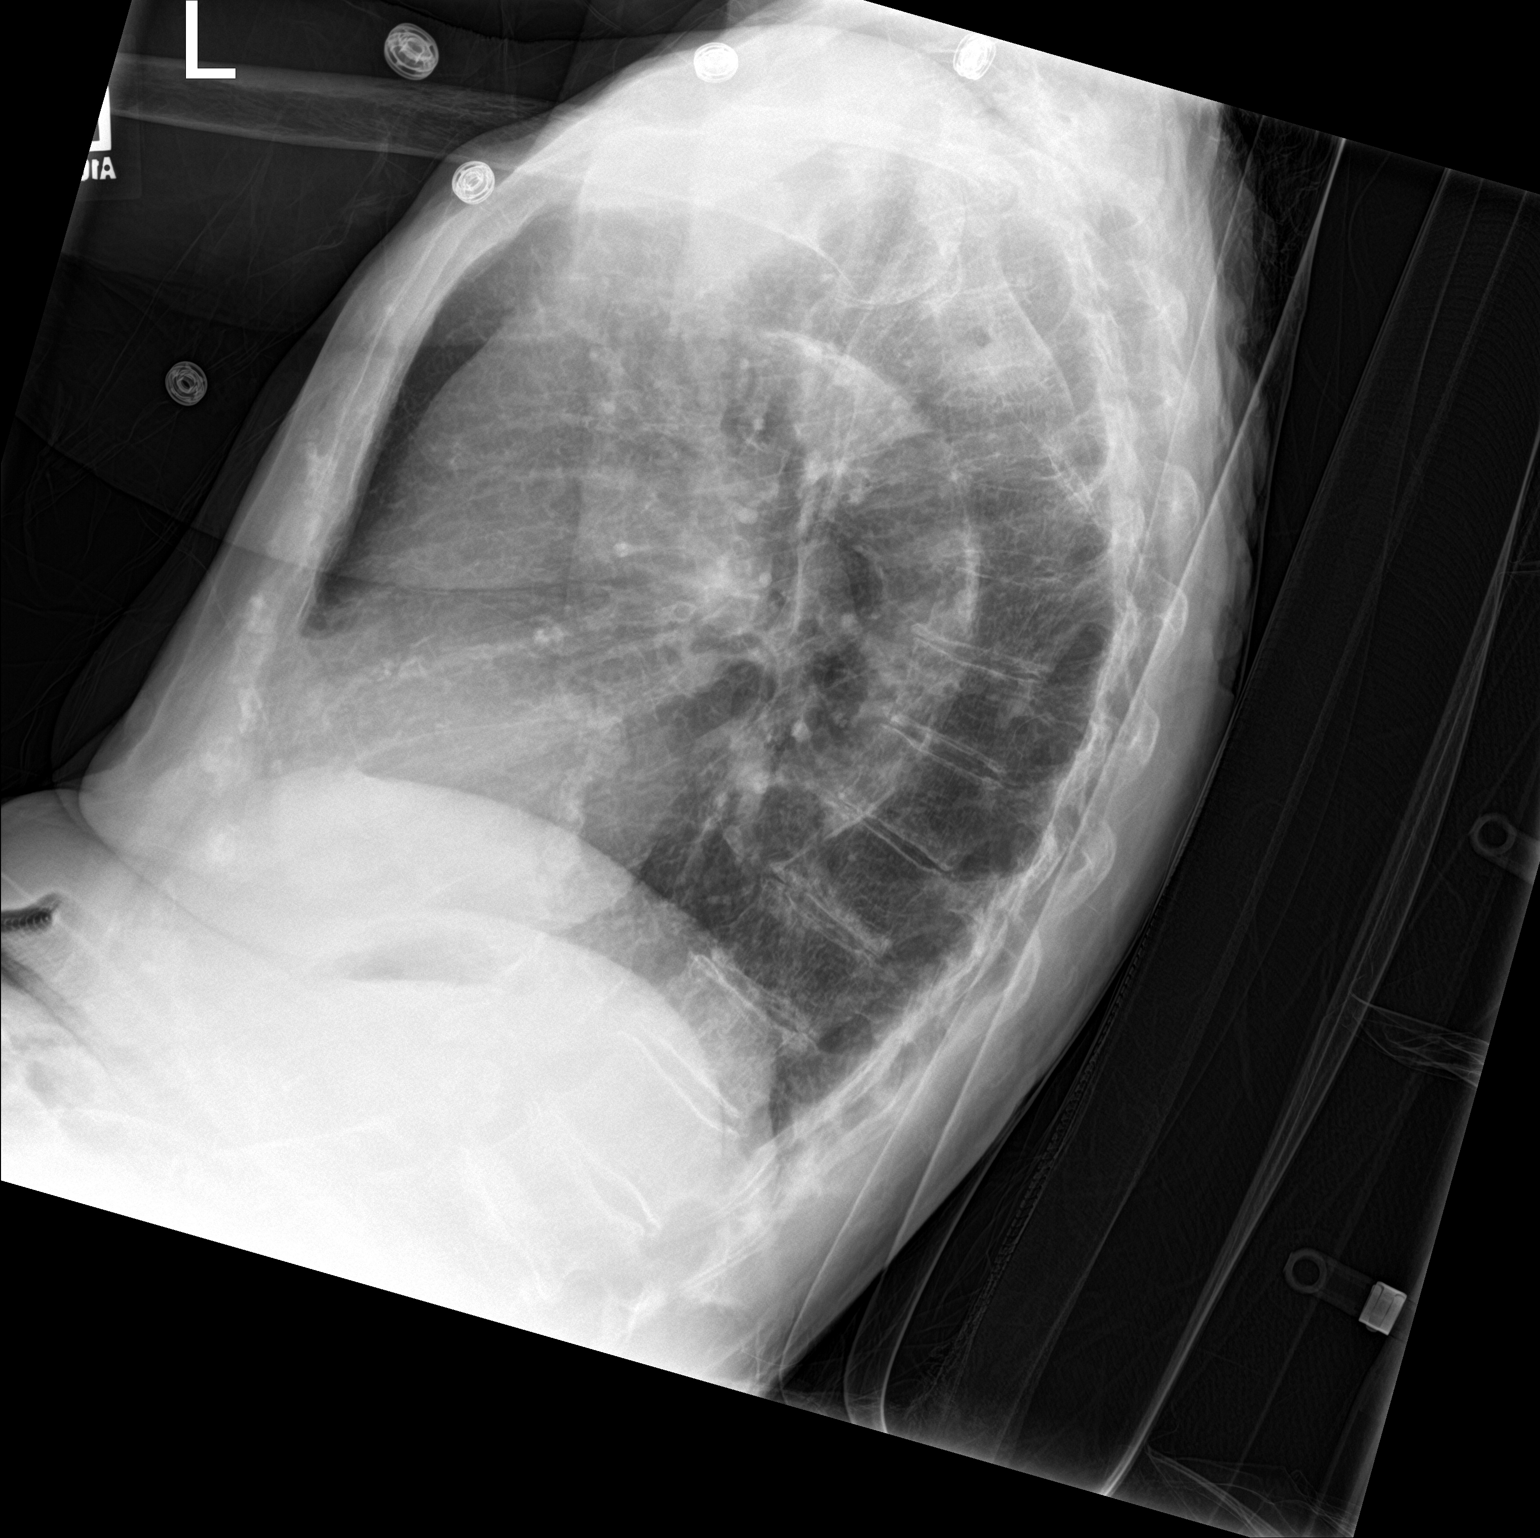

[chest ap]
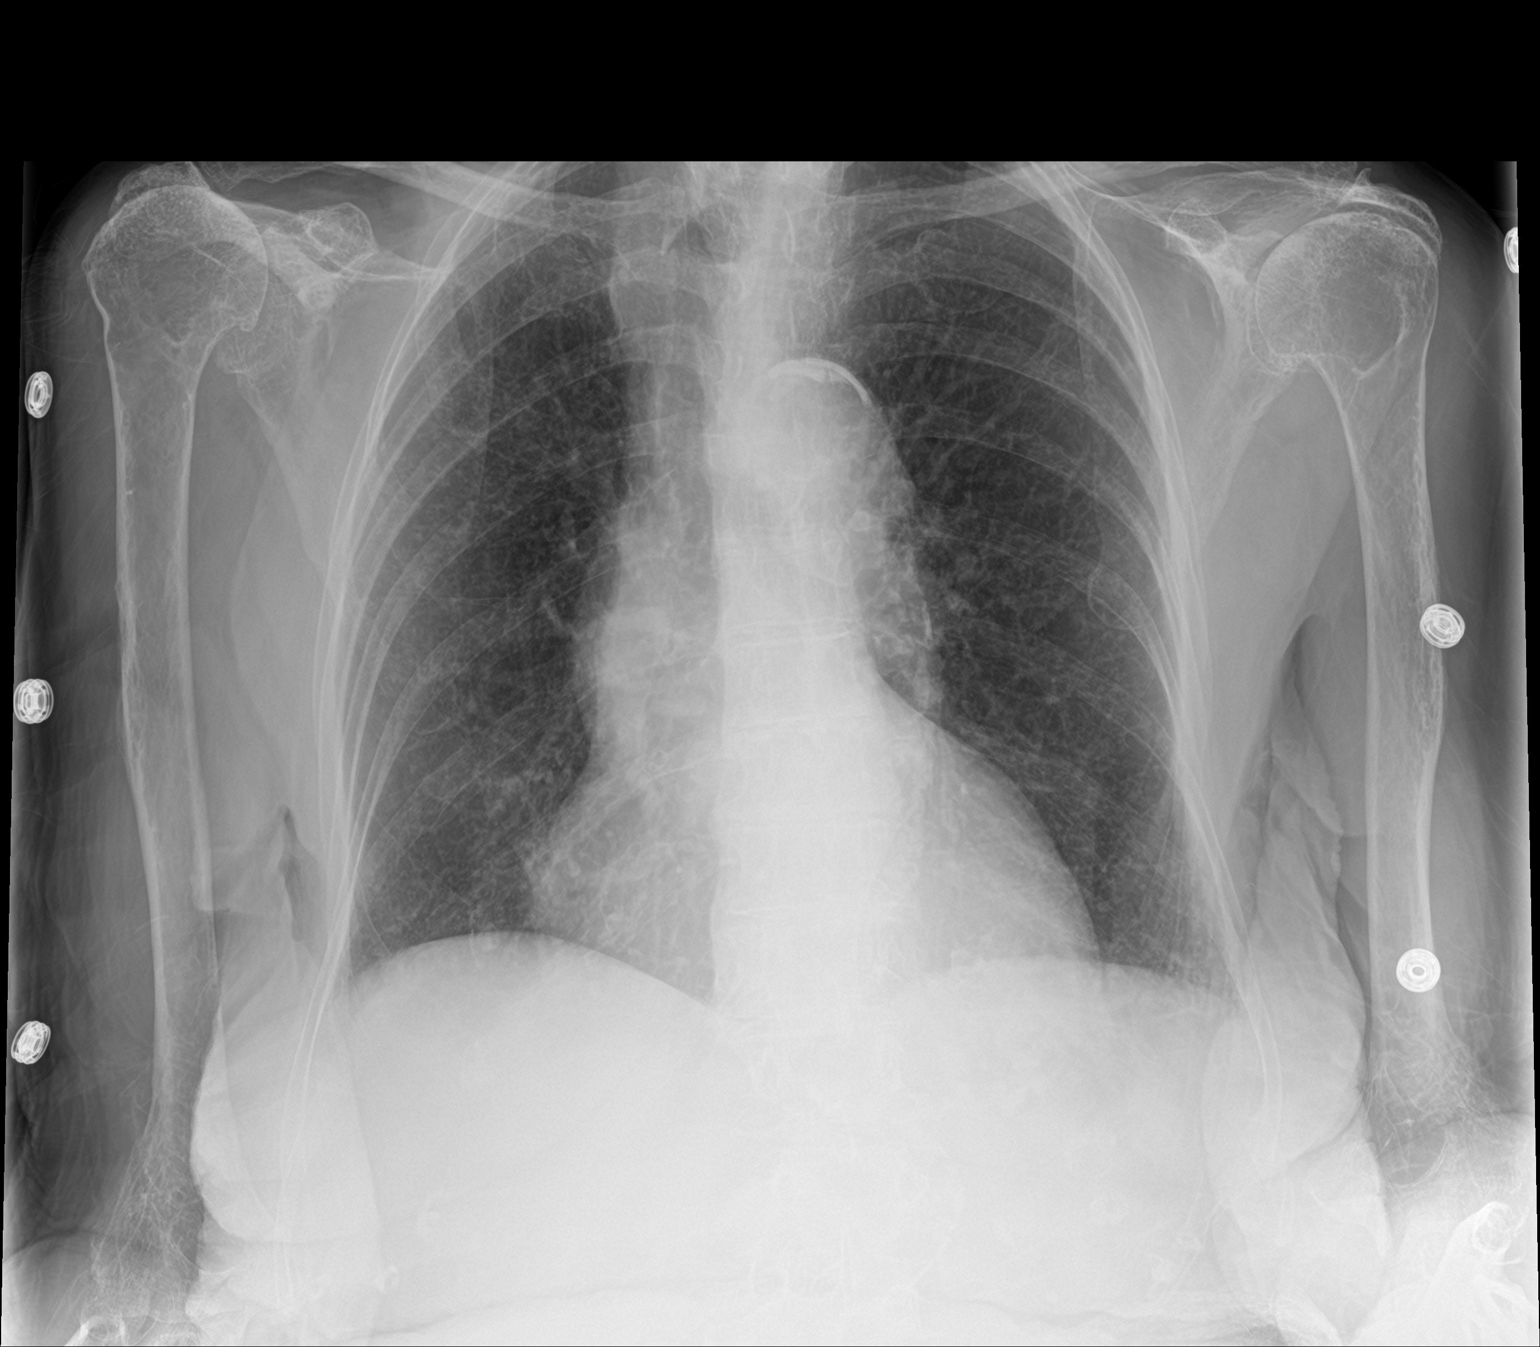

[2 of 2 positions shown; findings below may reference images not displayed]

FINDINGS: Cardiomediastinal silhouette is stable. No acute infiltrate or
pleural effusion. No pulmonary edema. No gross fractures are noted.
Degenerative changes bilateral shoulders. Atherosclerotic
calcifications of thoracic aorta. Osteopenia and mild degenerative
changes thoracic spine.
IMPRESSION: No active disease.  Degenerative changes bilateral shoulders.

## 2017-02-28 IMAGING — DX DG THORACIC SPINE 2V
2 series · 2 of 2 positions shown · non-contrast
Comparison: Lumbar spine radiographs-earlier same day Chest
radiograph - 08/13/2007; 07/04/2005

CLINICAL DATA: Fall at home on carpeted [HOSPITAL] days ago now with
mid thoracic spine pain.

EXAM:
THORACIC SPINE 2 VIEWS

[t-spine ap]
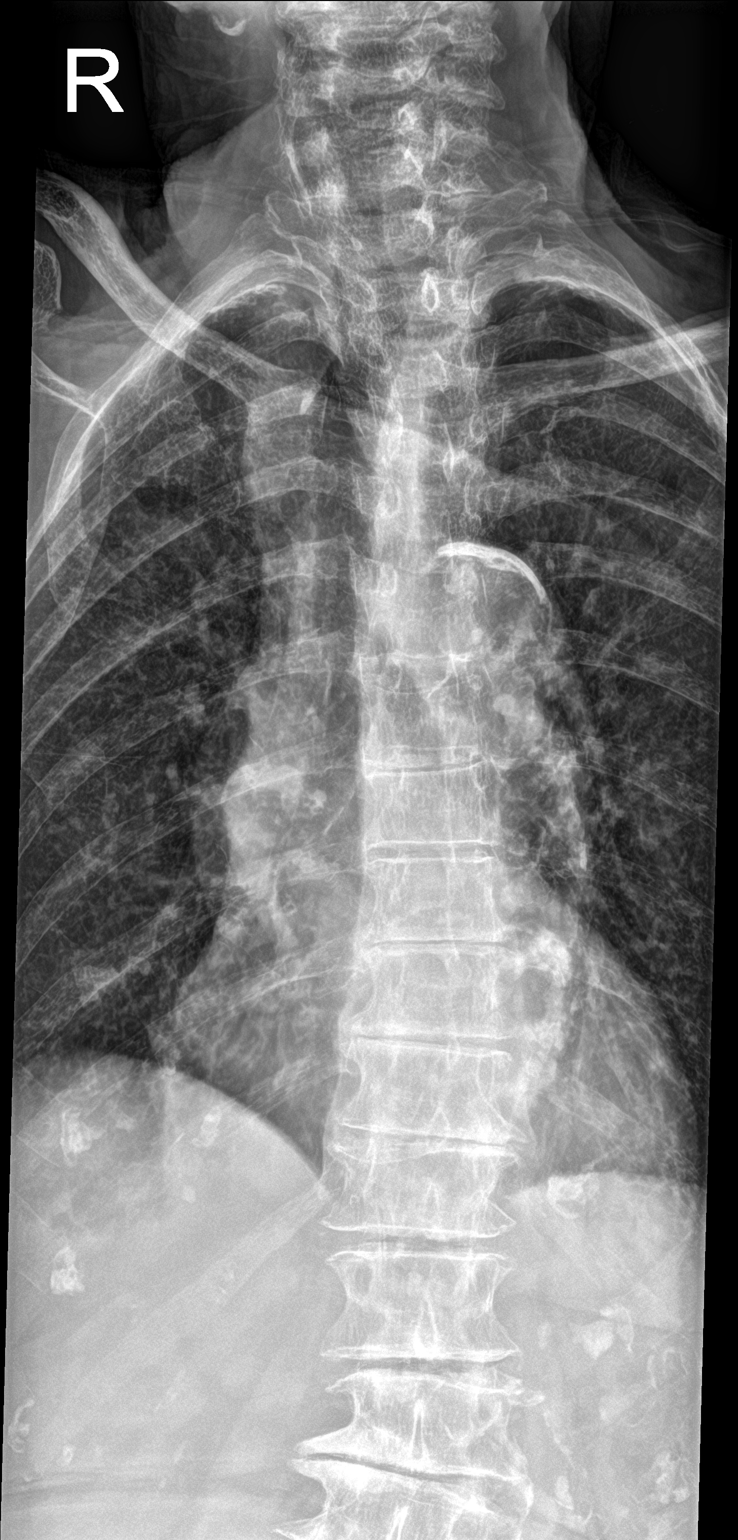

[t-spine lat]
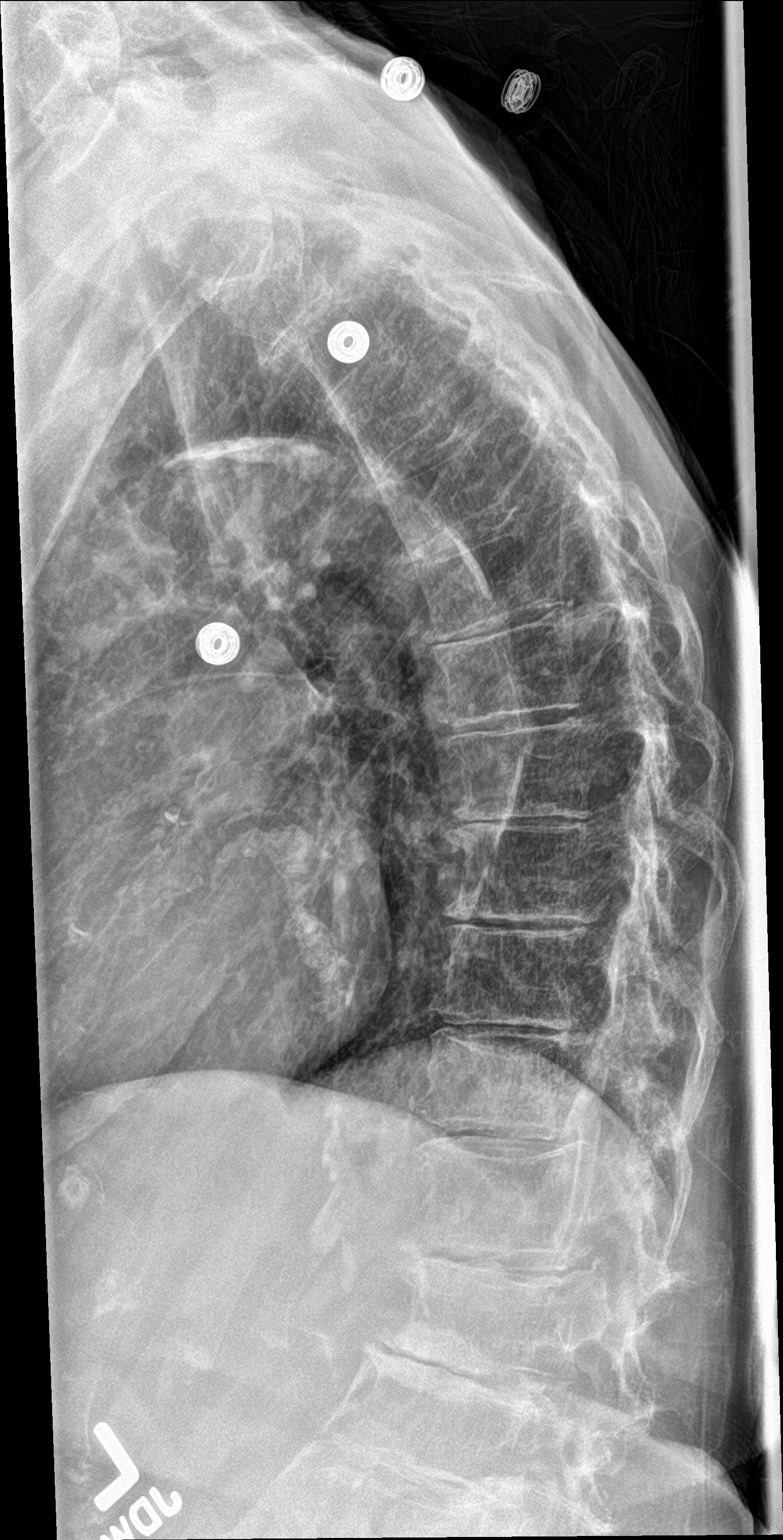

[2 of 2 positions shown; findings below may reference images not displayed]

FINDINGS: Re- demonstrated mild presumably degenerative scoliotic curvature of
the caudal aspect of the thoracic spine. No anterolisthesis or
retrolisthesis.

Age-indeterminate mild (under 25%) compression deformity involving
the T12 vertebral body. Remaining thoracic vertebral body heights
appear preserved given obliquity.

Thoracic intervertebral disc space heights appear preserved given
obliquity.

Atherosclerotic plaque within the thoracic aorta.

Limited visualization of the adjacent thorax is normal. Regional
soft tissues appear normal.
IMPRESSION: 1. Age-indeterminate mild (< 25%) compression deformity involving
the T12 vertebral body. Correlation for point tenderness at this
location is recommended.
2.  Aortic Atherosclerosis ([id number]-170.0)

## 2017-04-06 ENCOUNTER — Encounter (HOSPITAL_COMMUNITY): Payer: Self-pay | Admitting: *Deleted

## 2017-04-06 ENCOUNTER — Inpatient Hospital Stay (HOSPITAL_COMMUNITY)
Admission: EM | Admit: 2017-04-06 | Discharge: 2017-04-14 | DRG: 377 | Disposition: A | Discharge status: Skilled Nursing Facility | Payer: Medicare HMO | Attending: Internal Medicine | Admitting: Internal Medicine

## 2017-04-06 DIAGNOSIS — T18128A Food in esophagus causing other injury, initial encounter: Secondary | ICD-10-CM | POA: Diagnosis not present

## 2017-04-06 DIAGNOSIS — E43 Unspecified severe protein-calorie malnutrition: Secondary | ICD-10-CM | POA: Diagnosis not present

## 2017-04-06 DIAGNOSIS — Z980 Intestinal bypass and anastomosis status: Secondary | ICD-10-CM | POA: Diagnosis not present

## 2017-04-06 DIAGNOSIS — E876 Hypokalemia: Secondary | ICD-10-CM | POA: Diagnosis present

## 2017-04-06 DIAGNOSIS — R531 Weakness: Secondary | ICD-10-CM | POA: Diagnosis not present

## 2017-04-06 DIAGNOSIS — F0390 Unspecified dementia without behavioral disturbance: Secondary | ICD-10-CM | POA: Diagnosis present

## 2017-04-06 DIAGNOSIS — D72829 Elevated white blood cell count, unspecified: Secondary | ICD-10-CM

## 2017-04-06 DIAGNOSIS — Z681 Body mass index (BMI) 19 or less, adult: Secondary | ICD-10-CM | POA: Diagnosis not present

## 2017-04-06 DIAGNOSIS — I248 Other forms of acute ischemic heart disease: Secondary | ICD-10-CM | POA: Diagnosis not present

## 2017-04-06 DIAGNOSIS — Z934 Other artificial openings of gastrointestinal tract status: Secondary | ICD-10-CM

## 2017-04-06 DIAGNOSIS — G894 Chronic pain syndrome: Secondary | ICD-10-CM | POA: Diagnosis present

## 2017-04-06 DIAGNOSIS — D62 Acute posthemorrhagic anemia: Secondary | ICD-10-CM | POA: Diagnosis not present

## 2017-04-06 DIAGNOSIS — K210 Gastro-esophageal reflux disease with esophagitis: Secondary | ICD-10-CM | POA: Diagnosis present

## 2017-04-06 DIAGNOSIS — K219 Gastro-esophageal reflux disease without esophagitis: Secondary | ICD-10-CM | POA: Diagnosis present

## 2017-04-06 DIAGNOSIS — E039 Hypothyroidism, unspecified: Secondary | ICD-10-CM | POA: Diagnosis present

## 2017-04-06 DIAGNOSIS — Z79899 Other long term (current) drug therapy: Secondary | ICD-10-CM

## 2017-04-06 DIAGNOSIS — G9340 Encephalopathy, unspecified: Secondary | ICD-10-CM | POA: Diagnosis not present

## 2017-04-06 DIAGNOSIS — K250 Acute gastric ulcer with hemorrhage: Secondary | ICD-10-CM

## 2017-04-06 DIAGNOSIS — K222 Esophageal obstruction: Secondary | ICD-10-CM | POA: Diagnosis present

## 2017-04-06 DIAGNOSIS — H3530 Unspecified macular degeneration: Secondary | ICD-10-CM | POA: Diagnosis present

## 2017-04-06 DIAGNOSIS — M6281 Muscle weakness (generalized): Secondary | ICD-10-CM | POA: Diagnosis not present

## 2017-04-06 DIAGNOSIS — K311 Adult hypertrophic pyloric stenosis: Secondary | ICD-10-CM | POA: Diagnosis not present

## 2017-04-06 DIAGNOSIS — R404 Transient alteration of awareness: Secondary | ICD-10-CM | POA: Diagnosis not present

## 2017-04-06 DIAGNOSIS — K254 Chronic or unspecified gastric ulcer with hemorrhage: Secondary | ICD-10-CM | POA: Diagnosis not present

## 2017-04-06 DIAGNOSIS — K922 Gastrointestinal hemorrhage, unspecified: Secondary | ICD-10-CM | POA: Diagnosis not present

## 2017-04-06 DIAGNOSIS — J449 Chronic obstructive pulmonary disease, unspecified: Secondary | ICD-10-CM | POA: Diagnosis present

## 2017-04-06 DIAGNOSIS — Z66 Do not resuscitate: Secondary | ICD-10-CM | POA: Diagnosis present

## 2017-04-06 DIAGNOSIS — I10 Essential (primary) hypertension: Secondary | ICD-10-CM | POA: Diagnosis present

## 2017-04-06 DIAGNOSIS — R1084 Generalized abdominal pain: Secondary | ICD-10-CM | POA: Diagnosis not present

## 2017-04-06 DIAGNOSIS — I340 Nonrheumatic mitral (valve) insufficiency: Secondary | ICD-10-CM | POA: Diagnosis not present

## 2017-04-06 DIAGNOSIS — K920 Hematemesis: Secondary | ICD-10-CM | POA: Diagnosis not present

## 2017-04-06 DIAGNOSIS — E46 Unspecified protein-calorie malnutrition: Secondary | ICD-10-CM | POA: Diagnosis present

## 2017-04-06 DIAGNOSIS — R000 Tachycardia, unspecified: Secondary | ICD-10-CM | POA: Diagnosis not present

## 2017-04-06 DIAGNOSIS — T39395A Adverse effect of other nonsteroidal anti-inflammatory drugs [NSAID], initial encounter: Secondary | ICD-10-CM | POA: Diagnosis present

## 2017-04-06 DIAGNOSIS — R41841 Cognitive communication deficit: Secondary | ICD-10-CM | POA: Diagnosis not present

## 2017-04-06 DIAGNOSIS — R296 Repeated falls: Secondary | ICD-10-CM | POA: Diagnosis not present

## 2017-04-06 DIAGNOSIS — K284 Chronic or unspecified gastrojejunal ulcer with hemorrhage: Secondary | ICD-10-CM | POA: Diagnosis not present

## 2017-04-06 DIAGNOSIS — K921 Melena: Secondary | ICD-10-CM | POA: Diagnosis not present

## 2017-04-06 DIAGNOSIS — I351 Nonrheumatic aortic (valve) insufficiency: Secondary | ICD-10-CM | POA: Diagnosis not present

## 2017-04-06 DIAGNOSIS — R278 Other lack of coordination: Secondary | ICD-10-CM | POA: Diagnosis not present

## 2017-04-06 DIAGNOSIS — R1314 Dysphagia, pharyngoesophageal phase: Secondary | ICD-10-CM | POA: Diagnosis not present

## 2017-04-06 DIAGNOSIS — R1312 Dysphagia, oropharyngeal phase: Secondary | ICD-10-CM | POA: Diagnosis not present

## 2017-04-06 DIAGNOSIS — R2681 Unsteadiness on feet: Secondary | ICD-10-CM | POA: Diagnosis not present

## 2017-04-06 LAB — CBC
HEMATOCRIT: 20.9 % — AB (ref 36.0–46.0)
HEMOGLOBIN: 6.6 g/dL — AB (ref 12.0–15.0)
MCH: 24.4 pg — AB (ref 26.0–34.0)
MCHC: 31.6 g/dL (ref 30.0–36.0)
MCV: 77.4 fL — ABNORMAL LOW (ref 78.0–100.0)
Platelets: 448 10*3/uL — ABNORMAL HIGH (ref 150–400)
RBC: 2.7 MIL/uL — AB (ref 3.87–5.11)
RDW: 14.8 % (ref 11.5–15.5)
WBC: 10.4 10*3/uL (ref 4.0–10.5)

## 2017-04-06 LAB — COMPREHENSIVE METABOLIC PANEL
ALBUMIN: 2.8 g/dL — AB (ref 3.5–5.0)
ALK PHOS: 55 U/L (ref 38–126)
ALT: 12 U/L — ABNORMAL LOW (ref 14–54)
ANION GAP: 11 (ref 5–15)
AST: 18 U/L (ref 15–41)
BUN: 21 mg/dL — ABNORMAL HIGH (ref 6–20)
CALCIUM: 8.7 mg/dL — AB (ref 8.9–10.3)
CO2: 28 mmol/L (ref 22–32)
Chloride: 103 mmol/L (ref 101–111)
Creatinine, Ser: 0.62 mg/dL (ref 0.44–1.00)
GFR calc non Af Amer: >60 mL/min (ref >60)
GLUCOSE: 158 mg/dL — AB (ref 65–99)
Potassium: 3.4 mmol/L — ABNORMAL LOW (ref 3.5–5.1)
SODIUM: 142 mmol/L (ref 135–145)
Total Bilirubin: 0.3 mg/dL (ref 0.3–1.2)
Total Protein: 5.9 g/dL — ABNORMAL LOW (ref 6.5–8.1)

## 2017-04-06 LAB — ABO/RH: ABO/RH(D): O NEG

## 2017-04-06 LAB — I-STAT CHEM 8, ED
BUN: 20 mg/dL (ref 6–20)
CALCIUM ION: 1.11 mmol/L — AB (ref 1.15–1.40)
CREATININE: 0.6 mg/dL (ref 0.44–1.00)
Chloride: 100 mmol/L — ABNORMAL LOW (ref 101–111)
GLUCOSE: 155 mg/dL — AB (ref 65–99)
HCT: 20 % — ABNORMAL LOW (ref 36.0–46.0)
HEMOGLOBIN: 6.8 g/dL — AB (ref 12.0–15.0)
Potassium: 3.3 mmol/L — ABNORMAL LOW (ref 3.5–5.1)
SODIUM: 138 mmol/L (ref 135–145)
TCO2: 28 mmol/L (ref 0–100)

## 2017-04-06 LAB — I-STAT TROPONIN, ED: TROPONIN I, POC: 0.01 ng/mL (ref 0.00–0.08)

## 2017-04-06 LAB — POC OCCULT BLOOD, ED: FECAL OCCULT BLD: POSITIVE — AB

## 2017-04-06 LAB — MRSA PCR SCREENING: MRSA by PCR: POSITIVE — AB

## 2017-04-06 LAB — PREPARE RBC (CROSSMATCH)

## 2017-04-06 LAB — TROPONIN I: Troponin I: 0.04 ng/mL (ref <0.03)

## 2017-04-06 MED ORDER — SODIUM CHLORIDE 0.9 % IV SOLN
8.0000 mg/h | INTRAVENOUS | Status: DC
  Administered 2017-04-06 – 2017-04-09 (×7): 8 mg/h via INTRAVENOUS
  Filled 2017-04-06 (×12): qty 80

## 2017-04-06 MED ORDER — ACETAMINOPHEN 650 MG RE SUPP: 650.0000 mg | Freq: Four times a day (QID) | RECTAL | Status: DC | PRN

## 2017-04-06 MED ORDER — ALBUTEROL SULFATE (2.5 MG/3ML) 0.083% IN NEBU: 2.5000 mg | INHALATION_SOLUTION | Freq: Four times a day (QID) | RESPIRATORY_TRACT | Status: DC | PRN

## 2017-04-06 MED ORDER — CHLORHEXIDINE GLUCONATE CLOTH 2 % EX PADS
6.0000 | MEDICATED_PAD | Freq: Every day | CUTANEOUS | Status: AC
  Administered 2017-04-07 – 2017-04-11 (×4): 6 via TOPICAL

## 2017-04-06 MED ORDER — MUPIROCIN 2 % EX OINT
1.0000 "application " | TOPICAL_OINTMENT | Freq: Two times a day (BID) | CUTANEOUS | Status: AC
  Administered 2017-04-06 – 2017-04-11 (×10): 1 via NASAL
  Filled 2017-04-06 (×4): qty 22

## 2017-04-06 MED ORDER — DIPHENHYDRAMINE HCL 25 MG PO CAPS
25.0000 mg | ORAL_CAPSULE | Freq: Every evening | ORAL | Status: DC | PRN
  Administered 2017-04-06 – 2017-04-13 (×8): 25 mg via ORAL
  Filled 2017-04-06 (×8): qty 1

## 2017-04-06 MED ORDER — LEVOTHYROXINE SODIUM 100 MCG PO TABS
100.0000 ug | ORAL_TABLET | Freq: Every day | ORAL | Status: DC
  Administered 2017-04-08 – 2017-04-14 (×7): 100 ug via ORAL
  Filled 2017-04-06 (×7): qty 1

## 2017-04-06 MED ORDER — ACETAMINOPHEN 325 MG PO TABS
650.0000 mg | ORAL_TABLET | Freq: Four times a day (QID) | ORAL | Status: DC | PRN
  Administered 2017-04-08 – 2017-04-11 (×3): 650 mg via ORAL
  Filled 2017-04-06 (×3): qty 2

## 2017-04-06 MED ORDER — SODIUM CHLORIDE 0.9 % IV SOLN: Freq: Once | INTRAVENOUS | Status: AC

## 2017-04-06 MED ORDER — SODIUM CHLORIDE 0.9 % IV SOLN: INTRAVENOUS | Status: AC

## 2017-04-06 MED ORDER — ALBUTEROL SULFATE HFA 108 (90 BASE) MCG/ACT IN AERS: 2.0000 | INHALATION_SPRAY | Freq: Four times a day (QID) | RESPIRATORY_TRACT | Status: DC | PRN

## 2017-04-06 MED ORDER — METOPROLOL TARTRATE 25 MG PO TABS
25.0000 mg | ORAL_TABLET | Freq: Two times a day (BID) | ORAL | Status: DC
  Administered 2017-04-06 – 2017-04-14 (×16): 25 mg via ORAL
  Filled 2017-04-06 (×16): qty 1

## 2017-04-06 MED ORDER — ADULT MULTIVITAMIN W/MINERALS CH
1.0000 | ORAL_TABLET | Freq: Every day | ORAL | Status: DC
  Administered 2017-04-07 – 2017-04-14 (×8): 1 via ORAL
  Filled 2017-04-06 (×8): qty 1

## 2017-04-06 MED ORDER — SODIUM CHLORIDE 0.9 % IV SOLN
INTRAVENOUS | Status: AC
  Administered 2017-04-06 (×2): via INTRAVENOUS

## 2017-04-06 MED ORDER — POTASSIUM CHLORIDE 10 MEQ/100ML IV SOLN
10.0000 meq | INTRAVENOUS | Status: AC
  Administered 2017-04-06 (×2): 10 meq via INTRAVENOUS
  Filled 2017-04-06 (×2): qty 100

## 2017-04-06 MED ORDER — SODIUM CHLORIDE 0.9 % IV BOLUS (SEPSIS)
1000.0000 mL | Freq: Once | INTRAVENOUS | Status: AC
  Administered 2017-04-06: 1000 mL via INTRAVENOUS

## 2017-04-06 MED ORDER — LABETALOL HCL 5 MG/ML IV SOLN
5.0000 mg | INTRAVENOUS | Status: DC | PRN
  Administered 2017-04-06: 5 mg via INTRAVENOUS
  Filled 2017-04-06 (×2): qty 4

## 2017-04-06 MED ORDER — SODIUM CHLORIDE 0.9 % IV SOLN
80.0000 mg | Freq: Once | INTRAVENOUS | Status: AC
  Administered 2017-04-06: 12:00:00 80 mg via INTRAVENOUS
  Filled 2017-04-06: qty 80

## 2017-04-06 NOTE — H&P (Addendum)
TRH H&P   Patient Demographics:    Katherine GitelmanBarbara Flynn, is a 83 y.o. female  MRN: [id number]   DOB - Mar 10, 1923  Admit Date - 04/06/2017  Outpatient Primary MD for the patient is Gordy SaversKwiatkowski, Peter F, MD  Referring MD/NP/PA: Adela LankFloyd  Outpatient Specialists:    Patient coming from: home  Chief Complaint  Patient presents with  . Weakness  . Melena      HPI:    Katherine GitelmanBarbara Flynn  is a 83 y.o. female, w hx of Hypothyroidism, Gerd, Esophagitis, pyloric stenosis,  h/o nissen fundoplication, diverticulosis apparently has melana. EGD 2008=> pyloric stenosis, and esophagitis.  Pt denies aspirin, nsaid use.   Pt is a poor historian .  Notes slight nause but uncertain when melana started. Pt denies emesis, abd pain, diarrhea, black stool.  Pt brought to ED by her family.   In ED,  Hgb 6.6, fobt +, mcv 77.4,  rdw 14.8, K=3.4,  Bun/creat 21/0.62.  Alb 2.8,  Pt will be admitted for GI bleeding, hypokalemia, and severe protein calorie malnutrition.      Review of systems:    In addition to the HPI above,  No Fever-chills, No Headache, No changes with Vision or hearing, No problems swallowing food or Liquids, No Chest pain, Cough or Shortness of Breath,  No Blood in stool or Urine, No dysuria, No new skin rashes or bruises, No new joints pains-aches,  No new weakness, tingling, numbness in any extremity, No recent weight gain or loss, No polyuria, polydypsia or polyphagia, No significant Mental Stressors.  A full 10 point Review of Systems was done, except as stated above, all other Review of Systems were negative.   With Past History of the following :    Past Medical History:  Diagnosis Date  . Chronic pain syndrome   . COPD (chronic obstructive pulmonary disease) (HCC)   . GERD (gastroesophageal reflux disease)   . History of pyloric stenosis   . Hypertension   .  Macular degeneration   . Osteoarthritis   . Thyroid disease   . Weight loss       Past Surgical History:  Procedure Laterality Date  . APPENDECTOMY    . CATARACT EXTRACTION    . JOINT REPLACEMENT    . nissan fundoplication  08/2007  . ORIF HIP FRACTURE    . TUBAL LIGATION        Social History:     Social History  Substance Use Topics  . Smoking status: Never Smoker  . Smokeless tobacco: Never Used  . Alcohol use No     Lives - at home    Family History :    No family history on file. Pt is unreliable about family history   Home Medications:   Prior to Admission medications   Medication Sig Start Date End Date Taking? Authorizing Provider  albuterol (PROVENTIL HFA;VENTOLIN HFA) 108 (  90 Base) MCG/ACT inhaler Inhale 2 puffs into the lungs every 6 (six) hours as needed for wheezing or shortness of breath. 10/11/16  Yes Tobey Grim, MD  levothyroxine (SYNTHROID, LEVOTHROID) 100 MCG tablet Take 1 tablet (100 mcg total) by mouth daily. 04/28/16  Yes Gordy Savers, MD  metoprolol tartrate (LOPRESSOR) 25 MG tablet Take 1 tablet (25 mg total) by mouth 2 (two) times daily. 04/28/16  Yes Gordy Savers, MD  Multiple Vitamin (MULTIVITAMIN WITH MINERALS) TABS tablet Take 1 tablet by mouth daily.   Yes [provider]     Allergies:    No Known Allergies   Physical Exam:   Vitals  Blood pressure (!) 141/63, pulse (!) 107, temperature 98.2 F (36.8 C), temperature source Oral, resp. rate 15, SpO2 94 %.   1. General  lying in bed in NAD,   2. Normal affect and insight, Not Suicidal or Homicidal, Awake   3. No F.N deficits, ALL C.Nerves Intact, Strength 5/5 all 4 extremities, Sensation intact all 4 extremities, Plantars down going.  4. Ears and Eyes appear Normal, Conjunctivae Pale, , Moist Oral Mucosa.  5. Supple Neck, No JVD, No cervical lymphadenopathy appriciated, No Carotid Bruits.  6. Symmetrical Chest wall movement, Good air movement  bilaterally, CTAB.  7. Tachycardic s1, s2, No Gallops, Rubs or Murmurs, No Parasternal Heave.  8. Positive Bowel Sounds, Abdomen Soft, No tenderness, No organomegaly appriciated,No rebound -guarding or rigidity.  9.  No Cyanosis, Normal Skin Turgor, No Skin Rash or Bruise.  10. Good muscle tone,  joints appear normal , no effusions, Normal ROM.  11. No Palpable Lymph Nodes in Neck or Axillae    Data Review:    CBC  Recent Labs Lab 04/06/17 1019 04/06/17 1038  WBC 10.4  --   HGB 6.6* 6.8*  HCT 20.9* 20.0*  PLT 448*  --   MCV 77.4*  --   MCH 24.4*  --   MCHC 31.6  --   RDW 14.8  --    ------------------------------------------------------------------------------------------------------------------  Chemistries   Recent Labs Lab 04/06/17 1019 04/06/17 1038  NA 142 138  K 3.4* 3.3*  CL 103 100*  CO2 28  --   GLUCOSE 158* 155*  BUN 21* 20  CREATININE 0.62 0.60  CALCIUM 8.7*  --   AST 18  --   ALT 12*  --   ALKPHOS 55  --   BILITOT 0.3  --    ------------------------------------------------------------------------------------------------------------------ CrCl cannot be calculated (Unknown ideal weight.). ------------------------------------------------------------------------------------------------------------------ No results for input(s): TSH, T4TOTAL, T3FREE, THYROIDAB in the last 72 hours.  Invalid input(s): FREET3  Coagulation profile No results for input(s): INR, PROTIME in the last 168 hours. ------------------------------------------------------------------------------------------------------------------- No results for input(s): DDIMER in the last 72 hours. -------------------------------------------------------------------------------------------------------------------  Cardiac Enzymes No results for input(s): CKMB, TROPONINI, MYOGLOBIN in the last 168 hours.  Invalid input(s):  CK ------------------------------------------------------------------------------------------------------------------ No results found for: BNP   ---------------------------------------------------------------------------------------------------------------  Urinalysis    Component Value Date/Time   COLORURINE YELLOW 06/05/2016 1600   APPEARANCEUR CLOUDY (A) 06/05/2016 1600   LABSPEC 1.026 06/05/2016 1600   PHURINE 5.5 06/05/2016 1600   GLUCOSEU NEGATIVE 06/05/2016 1600   HGBUR SMALL (A) 06/05/2016 1600   BILIRUBINUR NEGATIVE 06/05/2016 1600   KETONESUR 15 (A) 06/05/2016 1600   PROTEINUR NEGATIVE 06/05/2016 1600   UROBILINOGEN 0.2 04/20/2015 1452   NITRITE POSITIVE (A) 06/05/2016 1600   LEUKOCYTESUR SMALL (A) 06/05/2016 1600    ----------------------------------------------------------------------------------------------------------------   Imaging Results:  No results found.    Assessment & Plan:    Active Problems:   GI bleeding    Gi bleeding NPO protonix 80mg  iv x1, then 8mg  /hr Transfuse 2 units prbc Cbc after transfusion Cbc in am Appreciate GI consultation  Tachycardia Secondary to blood loss?/anemia Trop I q6h x3 Check tsh Consider cardiac echo if persistent  Hypokalemia Replete Check cmp in am  Hypothyroidism Cont levothyroxine  Hypertension Cont metoprolol  Protein calorie malnutrition Add prostat after EGD tomorrow.   Severe       DVT Prophylaxis  SCDs  AM Labs Ordered, also please review Full Orders  Family Communication: Admission, patients condition and plan of care including tests being ordered have been discussed with the patient  who indicate understanding and agree with the plan and Code Status.  Code Status DNR  Likely DC to  home  Condition GUARDED    Consults called: gastroenterology  Admission status: inpatient  Time spent in minutes : 45 minutes   Pearson GrippeJames Lyall Faciane M.D on 04/06/2017 at 12:28 PM  Between 7am to  7pm - Pager - [phone]. After 7pm go to [email or web address] - password [email or web address]  [facility] - Office  [phone]

## 2017-04-06 NOTE — ED Notes (Signed)
Family at bedside. 

## 2017-04-06 NOTE — Consult Note (Signed)
Consultation  Referring Provider: Dr. Adela Lank     Primary Care Physician:  Gordy Savers, MD Primary Gastroenterologist:  Dr. Leone Payor       Reason for Consultation:    Melena, ABLA          HPI:   Katherine Flynn is a 83 y.o. female with past medical history of GERD and multiple others listed below who presented to the ED this morning with complaint of melena.   Today, the patient is accompanied by her daughter and granddaughter, they do assist with her history as she has some trouble with her memory. Daughter explains that the patient is taken care of by caretakers who stay in the home with her. They reported that the patient started with black tarry stools last night, these proceded into today to the point were "they couldn't keep cleaning her out", due to the frequency. Patient has also been complaining of periumbilical pain in her abdomen for about 2 weeks. Daughter does relate occasional Advil use, but is uncertain how often the patient gets this for her various aches and pains as she is not always with her. Associated symptoms include some shortness of breath and weakness.   Previous surgical history is positive for gastrojejunostomy due to a recurrent pyloric stenosis on 08/16/07 (Operative report reviewed).   Patient and family deny fever, chills or chest pain.  Previous GI History: 07/18/07-EGD, Dr. Leone Payor: Impression: recurrent pyloric stenosis  Past Medical History:  Diagnosis Date  . Chronic pain syndrome   . COPD (chronic obstructive pulmonary disease) (HCC)   . GERD (gastroesophageal reflux disease)   . History of pyloric stenosis   . Hypertension   . Macular degeneration   . Osteoarthritis   . Thyroid disease   . Weight loss     Past Surgical History:  Procedure Laterality Date  . APPENDECTOMY    . CATARACT EXTRACTION    . JOINT REPLACEMENT    . nissan fundoplication  08/2007  . ORIF HIP FRACTURE    . TUBAL LIGATION      Family History: No family  history of gastric cancer  Social History  Substance Use Topics  . Smoking status: Never Smoker  . Smokeless tobacco: Never Used  . Alcohol use No    Prior to Admission medications   Medication Sig Start Date End Date Taking? Authorizing Provider  albuterol (PROVENTIL HFA;VENTOLIN HFA) 108 (90 Base) MCG/ACT inhaler Inhale 2 puffs into the lungs every 6 (six) hours as needed for wheezing or shortness of breath. 10/11/16  Yes Tobey Grim, MD  levothyroxine (SYNTHROID, LEVOTHROID) 100 MCG tablet Take 1 tablet (100 mcg total) by mouth daily. 04/28/16  Yes Gordy Savers, MD  metoprolol tartrate (LOPRESSOR) 25 MG tablet Take 1 tablet (25 mg total) by mouth 2 (two) times daily. 04/28/16  Yes Gordy Savers, MD  Multiple Vitamin (MULTIVITAMIN WITH MINERALS) TABS tablet Take 1 tablet by mouth daily.   Yes [provider]    Current Facility-Administered Medications  Medication Dose Route Frequency Provider Last Rate Last Dose  . sodium chloride 0.9 % bolus 1,000 mL  1,000 mL Intravenous Once Melene Plan, DO 1,000 mL/hr at 04/06/17 1051 1,000 mL at 04/06/17 1051   And  . 0.9 %  sodium chloride infusion   Intravenous Continuous Melene Plan, DO      . 0.9 %  sodium chloride infusion   Intravenous Once Melene Plan, DO      . pantoprazole (  PROTONIX) 80 mg in sodium chloride 0.9 % 100 mL IVPB  80 mg Intravenous Once Adela LankFloyd, Dan, DO      . pantoprazole (PROTONIX) 80 mg in sodium chloride 0.9 % 250 mL (0.32 mg/mL) infusion  8 mg/hr Intravenous Continuous Melene PlanFloyd, Dan, DO       Current Outpatient Prescriptions  Medication Sig Dispense Refill  . albuterol (PROVENTIL HFA;VENTOLIN HFA) 108 (90 Base) MCG/ACT inhaler Inhale 2 puffs into the lungs every 6 (six) hours as needed for wheezing or shortness of breath. 1 Inhaler 0  . levothyroxine (SYNTHROID, LEVOTHROID) 100 MCG tablet Take 1 tablet (100 mcg total) by mouth daily. 90 tablet 4  . metoprolol tartrate (LOPRESSOR) 25 MG tablet Take 1  tablet (25 mg total) by mouth 2 (two) times daily. 180 tablet 4  . Multiple Vitamin (MULTIVITAMIN WITH MINERALS) TABS tablet Take 1 tablet by mouth daily.      Allergies as of 04/06/2017  . (No Known Allergies)     Review of Systems:    Constitutional: No weight loss, fever or chills Skin: No rash Cardiovascular: No chest pain Respiratory: Positive for SOB Gastrointestinal: See HPI and otherwise negative Genitourinary: No dysuria Neurological: No headache or dizziness Musculoskeletal: No new muscle or joint pain Hematologic: No bruising Psychiatric: No history of depression or anxiety   Physical Exam:  Vital signs in last 24 hours: Temp:  [98.2 F (36.8 C)] 98.2 F (36.8 C) (07/27 1048) Pulse Rate:  [106] 106 (07/27 1048) Resp:  [18] 18 (07/27 1048) BP: (119)/(63) 119/63 (07/27 1048) SpO2:  [97 %] 97 % (07/27 1048)   General:   Pleasant thin appearing frail elderly Caucasian female appears to be in NAD, Well developed, Well nourished, alert and cooperative Head:  Normocephalic and atraumatic. Eyes:   PEERL, EOMI. No icterus. Conjunctiva Pale . Ears:  Somewhat hard of hearing Neck:  Supple Throat: Oral cavity and pharynx without inflammation, swelling or lesion. Teeth in good condition. Lungs: Respirations even and unlabored. Lungs clear to auscultation bilaterally.   No wheezes, crackles, or rhonchi.  Heart: Normal S1, S2. No MRG. Regular rate and rhythm. No peripheral edemaor cyanosis.sluggish capillary refill, pallor  Abdomen:  Soft, nondistended, mild TTP periumbilical. No rebound or guarding. Normal bowel sounds. No appreciable masses or hepatomegaly. Rectal:  Not performed.  Msk:  Symmetrical without gross deformities. Peripheral pulses intact.  Extremities:  Without edema, no deformity or joint abnormality.   Neurologic:  Alert and  oriented x4;  grossly normal neurologically.  Skin:   Dry and intact without significant lesions or rashes. Psychiatric:  Demonstrates  good judgement and reason without abnormal affect or behaviors.   LAB RESULTS:  Recent Labs  04/06/17 1019 04/06/17 1038  WBC 10.4  --   HGB 6.6* 6.8*  HCT 20.9* 20.0*  PLT 448*  --    BMET  Recent Labs  04/06/17 1019 04/06/17 1038  NA 142 138  K 3.4* 3.3*  CL 103 100*  CO2 28  --   GLUCOSE 158* 155*  BUN 21* 20  CREATININE 0.62 0.60  CALCIUM 8.7*  --    LFT  Recent Labs  04/06/17 1019  PROT 5.9*  ALBUMIN 2.8*  AST 18  ALT 12*  ALKPHOS 55  BILITOT 0.3    PREVIOUS ENDOSCOPIES:            See HPI   Impression / Plan:   Impression: 1. Melena:per daughter who is in the room this started last night and continued into  today and "wouldn't stop", patient has also been complaining of abdominal pain for 2 weeks which is periumbilical in nature, occasional Advil use, uncertain how much as patient is cared for by caregivers who were not present, previous gastrojejunostomy in 2008; consider PUD versus gastritis versus surgical anastomosis ulcer versus other. 2. ABLA:with above, hemoglobin 6.6 at admission  Plan: 1. Agree with serial hgb with transfusion as needed <7 2. Agree with PPI drip 3. Continue supportive measures 4. Increased ordered PRBCs from one unit to 2 units 5. Patient may be on clear liquids today and nothing by mouth after midnight 6. Assuming patient stays stable overnight, we will plan for EGD tomorrow morning. Discussed risks, benefits, limitations and alternatives the patient. She did sign her own consent in the room, her daughter Lavonna RuaSheri was present as well. 7. Please await any final recommendations from Dr. Myrtie Neitheranis  Thank you for your kind consultation, we will continue to follow.  Violet BaldyJennifer Lynne Lemmon  04/06/2017, 11:03 AM Pager #: [phone]   I saw also patient in the ED along with my PA, who acted as Neurosurgeonscribe.  Therefore, I agree with the diagnoses recorded above. In addition,  I have personally interviewed and examined the patient and have  personally reviewed any abdominal/pelvic CT scan images.  My additional thoughts are as follows:  The patient's blood pressure is currently normal, she is mildly tachycardic. There has been no hematemesis.  The patient has severe acute blood loss anemia, though I wonder if she may have been losing blood intermittently over the last week or longer, but just had an increased amount of bleeding in the last 24 hours. We do suspect an ulcer, and an upper endoscopy is planned with the patient has been resuscitated with blood. The patient is alert and conversational incapable of giving her own consent. Thus the procedure was reviewed in detail along with her daughter Lavonna RuaSheri and the patient's nurse Stanford BreedMacon present. Patient at increased risk for cardiopulmonary complications of procedure due to medical comorbidities.   She is an increased risk procedure due to her age and debility.  Charlie PitterHenry L Danis III Pager [phone]  Mon-Fri 8a-5p [phone] after 5p, weekends, holidays

## 2017-04-06 NOTE — ED Notes (Signed)
Bed: WA20 Expected date:  Expected time:  Means of arrival:  Comments: EMS-tarry stools

## 2017-04-06 NOTE — Progress Notes (Signed)
CRITICAL VALUE ALERT  Critical Value:  MRSA positive pcr  Date & Time Notied:  04/06/17 1540  Provider Notified: Attempted to call Dr. Selena BattenKim x2  Orders Received/Actions taken: awaiting response

## 2017-04-06 NOTE — ED Notes (Signed)
GI MD Danis at bedside

## 2017-04-06 NOTE — ED Provider Notes (Signed)
[facility]-EMERGENCY DEPT Provider Note   CSN: [id number] Arrival date & time: 04/06/17  [id number]     History   Chief Complaint Chief Complaint  Patient presents with  . Weakness  . Melena    HPI Katherine Flynn is a 83 y.o. female.  83 yo F with a chief complaint of dark stool and weakness. This been going on for the past 3 days or so. Started initially with some dark and tarry stools and now patient is having multiple bowel movements throughout the day. Is complaining of worsening weakness. Having some mild shortness of breath with this. Denies chest pain. Denies blood thinner use. Having some cramping to her abdomen just before she has a bowel movement but denies abdominal pain. Denies fevers.   The history is provided by the patient and a relative.  Illness  This is a new problem. The current episode started 2 days ago. The problem occurs constantly. The problem has been rapidly worsening. Associated symptoms include shortness of breath. Pertinent negatives include no chest pain and no headaches. Nothing aggravates the symptoms. Nothing relieves the symptoms. She has tried nothing for the symptoms. The treatment provided no relief.    Past Medical History:  Diagnosis Date  . Chronic pain syndrome   . COPD (chronic obstructive pulmonary disease) (HCC)   . GERD (gastroesophageal reflux disease)   . History of pyloric stenosis   . Hypertension   . Macular degeneration   . Osteoarthritis   . Thyroid disease   . Weight loss     Patient Active Problem List   Diagnosis Date Noted  . UTI (lower urinary tract infection) 05/01/2016  . Dehydration 05/01/2016  . Multiple pelvic fractures 11/04/2015  . Fall at home 11/04/2015  . Prolonged Q-T interval on ECG 11/04/2015  . RBBB 11/04/2015  . Dilated bile duct 11/04/2015  . Pancreatic duct dilated 11/04/2015  . Abnormal CT of the abdomen 11/04/2015  . Fall 04/19/2015  . Protein-calorie malnutrition, severe (HCC) 04/19/2015  .  Closed fracture of right iliac wing (HCC) 04/18/2015  . Peri-prosthetic fracture around prosthetic hip 04/18/2015  . Protein calorie malnutrition (HCC) 04/18/2015  . Hypokalemia 11/24/2014  . Iron deficiency anemia 12/30/2012  . Chronic bronchitis with COPD (chronic obstructive pulmonary disease) (HCC) 11/19/2007  . UTI'S, RECURRENT 11/19/2007  . HEEL SPUR 11/19/2007  . Hypothyroidism 11/04/2007  . Essential hypertension 11/04/2007  . GERD 11/04/2007  . Osteoarthritis 11/04/2007  . WEIGHT LOSS 11/04/2007  . PYLORIC STENOSIS 07/18/2007  . OTHER OBSTRUCTION OF DUODENUM 08/10/2006    Past Surgical History:  Procedure Laterality Date  . APPENDECTOMY    . CATARACT EXTRACTION    . JOINT REPLACEMENT    . nissan fundoplication  08/2007  . ORIF HIP FRACTURE    . TUBAL LIGATION      OB History    Gravida Para Term Preterm AB Living   12 9     3      SAB TAB Ectopic Multiple Live Births                   Home Medications    Prior to Admission medications   Medication Sig Start Date End Date Taking? Authorizing Provider  albuterol (PROVENTIL HFA;VENTOLIN HFA) 108 (90 Base) MCG/ACT inhaler Inhale 2 puffs into the lungs every 6 (six) hours as needed for wheezing or shortness of breath. 10/11/16  Yes Tobey GrimWalden, Jeffrey H, MD  levothyroxine (SYNTHROID, LEVOTHROID) 100 MCG tablet Take 1 tablet (100 mcg total) by  mouth daily. 04/28/16  Yes Gordy Savers, MD  metoprolol tartrate (LOPRESSOR) 25 MG tablet Take 1 tablet (25 mg total) by mouth 2 (two) times daily. 04/28/16  Yes Gordy Savers, MD  Multiple Vitamin (MULTIVITAMIN WITH MINERALS) TABS tablet Take 1 tablet by mouth daily.   Yes [provider]    Family History No family history on file.  Social History Social History  Substance Use Topics  . Smoking status: Never Smoker  . Smokeless tobacco: Never Used  . Alcohol use No     Allergies   Patient has no known allergies.   Review of Systems Review of  Systems  Constitutional: Negative for chills and fever.  HENT: Negative for congestion and rhinorrhea.   Eyes: Negative for redness and visual disturbance.  Respiratory: Positive for shortness of breath. Negative for wheezing.   Cardiovascular: Negative for chest pain and palpitations.  Gastrointestinal: Positive for blood in stool and diarrhea. Negative for nausea and vomiting.  Genitourinary: Negative for dysuria and urgency.  Musculoskeletal: Negative for arthralgias and myalgias.  Skin: Negative for pallor and wound.  Neurological: Positive for weakness. Negative for dizziness and headaches.     Physical Exam Updated Vital Signs BP 119/63 (BP Location: Left Arm)   Pulse (!) 106   Temp 98.2 F (36.8 C) (Oral)   Resp 18   SpO2 97%   Physical Exam  Constitutional: She is oriented to person, place, and time. She appears ill. No distress.  pale  HENT:  Head: Normocephalic and atraumatic.  Eyes: Pupils are equal, round, and reactive to light. EOM are normal.  Neck: Normal range of motion. Neck supple.  Cardiovascular: Regular rhythm.  Tachycardia present.  Exam reveals no gallop and no friction rub.   No murmur heard. Pulmonary/Chest: Effort normal. She has no wheezes. She has no rales.  Abdominal: Soft. She exhibits no distension. There is no tenderness.  Genitourinary:  Genitourinary Comments: Gross melena on rectal exam  Musculoskeletal: She exhibits no edema or tenderness.  Neurological: She is alert and oriented to person, place, and time.  Skin: Skin is warm and dry. She is not diaphoretic.  Psychiatric: She has a normal mood and affect. Her behavior is normal.  Nursing note and vitals reviewed.    ED Treatments / Results  Labs (all labs ordered are listed, but only abnormal results are displayed) Labs Reviewed  COMPREHENSIVE METABOLIC PANEL - Abnormal; Notable for the following:       Result Value   Potassium 3.4 (*)    Glucose, Bld 158 (*)    BUN 21 (*)     Calcium 8.7 (*)    Total Protein 5.9 (*)    Albumin 2.8 (*)    ALT 12 (*)    All other components within normal limits  CBC - Abnormal; Notable for the following:    RBC 2.70 (*)    Hemoglobin 6.6 (*)    HCT 20.9 (*)    MCV 77.4 (*)    MCH 24.4 (*)    Platelets 448 (*)    All other components within normal limits  POC OCCULT BLOOD, ED - Abnormal; Notable for the following:    Fecal Occult Bld POSITIVE (*)    All other components within normal limits  I-STAT CHEM 8, ED - Abnormal; Notable for the following:    Potassium 3.3 (*)    Chloride 100 (*)    Glucose, Bld 155 (*)    Calcium, Ion 1.11 (*)  Hemoglobin 6.8 (*)    HCT 20.0 (*)    All other components within normal limits  POC OCCULT BLOOD, ED  I-STAT TROPONIN, ED  TYPE AND SCREEN  PREPARE RBC (CROSSMATCH)    EKG  EKG Interpretation  Date/Time:  Friday April 06 2017 10:07:21 EDT Ventricular Rate:  107 PR Interval:    QRS Duration: 126 QT Interval:  383 QTC Calculation: 511 R Axis:   -45 Text Interpretation:  Sinus tachycardia RBBB and LAFB Nonspecific T abnormalities, lateral leads duplicate please delete Confirmed by Melene Plan [phone]) on 04/06/2017 10:31:35 AM       Radiology No results found.  Procedures Procedures (including critical care time)  Medications Ordered in ED Medications  pantoprazole (PROTONIX) 80 mg in sodium chloride 0.9 % 100 mL IVPB (not administered)  pantoprazole (PROTONIX) 80 mg in sodium chloride 0.9 % 250 mL (0.32 mg/mL) infusion (not administered)  sodium chloride 0.9 % bolus 1,000 mL (1,000 mLs Intravenous New Bag/Given 04/06/17 1051)    And  0.9 %  sodium chloride infusion (not administered)  0.9 %  sodium chloride infusion (not administered)     Initial Impression / Assessment and Plan / ED Course  I have reviewed the triage vital signs and the nursing notes.  Pertinent labs & imaging results that were available during my care of the patient were reviewed by me and  considered in my medical decision making (see chart for details).     83 yo F With a chief complaint of dark and tarry stools and weakness. Patient has a likely upper GI bleed. Hemoglobin is 6.8. Will give blood. Vital signs with mild trachycardia.   Discussed with GI will come and see patient.  Admit.   CRITICAL CARE Performed by: Rae Roam   Total critical care time: 35 minutes  Critical care time was exclusive of separately billable procedures and treating other patients.  Critical care was necessary to treat or prevent imminent or life-threatening deterioration.  Critical care was time spent personally by me on the following activities: development of treatment plan with patient and/or surrogate as well as nursing, discussions with consultants, evaluation of patient's response to treatment, examination of patient, obtaining history from patient or surrogate, ordering and performing treatments and interventions, ordering and review of laboratory studies, ordering and review of radiographic studies, pulse oximetry and re-evaluation of patient's condition.  The patients results and plan were reviewed and discussed.   Any x-rays performed were independently reviewed by myself.   Differential diagnosis were considered with the presenting HPI.  Medications  pantoprazole (PROTONIX) 80 mg in sodium chloride 0.9 % 100 mL IVPB (not administered)  pantoprazole (PROTONIX) 80 mg in sodium chloride 0.9 % 250 mL (0.32 mg/mL) infusion (not administered)  sodium chloride 0.9 % bolus 1,000 mL (1,000 mLs Intravenous New Bag/Given 04/06/17 1051)    And  0.9 %  sodium chloride infusion (not administered)  0.9 %  sodium chloride infusion (not administered)    Vitals:   04/06/17 1048  BP: 119/63  Pulse: (!) 106  Resp: 18  Temp: 98.2 F (36.8 C)  TempSrc: Oral  SpO2: 97%    Final diagnoses:  Acute upper gastrointestinal bleeding    Admission/ observation were discussed with the  admitting physician, patient and/or family and they are comfortable with the plan.   Final Clinical Impressions(s) / ED Diagnoses   Final diagnoses:  Acute upper gastrointestinal bleeding    New Prescriptions New Prescriptions   No medications on  file     Melene PlanFloyd, Clarabell Matsuoka, DO 04/06/17 1115

## 2017-04-06 NOTE — ED Triage Notes (Signed)
Per EMS, pt from home, reports generalized weakness.  Noticed black tarry stools last night.  Pt is also c/o mild SOB and generalized abd pain.

## 2017-04-06 NOTE — Progress Notes (Signed)
CRITICAL VALUE ALERT  Critical Value: TROPONIN: 0.04  Date & Time Notied:04/06/17  Provider Notified: Dr. Selena BattenKim at 15:12  Orders Received/Actions taken:No orders received at this time. Will continue to monitor.   Pete Peltlivia Kellis Topete, RN

## 2017-04-06 NOTE — ED Notes (Signed)
Pt's son reports black tarry stool last night and has been feeling weak for the past week.  Pt's daughter reports pt has also been c/o abd pain for the past 2 weeks.

## 2017-04-07 ENCOUNTER — Encounter (HOSPITAL_COMMUNITY)
Admission: EM | Disposition: A | Discharge status: Skilled Nursing Facility | Payer: Self-pay | Source: Home / Self Care | Attending: Internal Medicine

## 2017-04-07 ENCOUNTER — Inpatient Hospital Stay (HOSPITAL_COMMUNITY): Payer: Medicare HMO | Admitting: Certified Registered Nurse Anesthetist

## 2017-04-07 ENCOUNTER — Encounter (HOSPITAL_COMMUNITY): Payer: Self-pay | Admitting: *Deleted

## 2017-04-07 ENCOUNTER — Inpatient Hospital Stay (HOSPITAL_COMMUNITY): Payer: Medicare HMO

## 2017-04-07 DIAGNOSIS — K284 Chronic or unspecified gastrojejunal ulcer with hemorrhage: Principal | ICD-10-CM

## 2017-04-07 DIAGNOSIS — Z980 Intestinal bypass and anastomosis status: Secondary | ICD-10-CM

## 2017-04-07 DIAGNOSIS — I351 Nonrheumatic aortic (valve) insufficiency: Secondary | ICD-10-CM

## 2017-04-07 DIAGNOSIS — K222 Esophageal obstruction: Secondary | ICD-10-CM

## 2017-04-07 DIAGNOSIS — I340 Nonrheumatic mitral (valve) insufficiency: Secondary | ICD-10-CM

## 2017-04-07 HISTORY — PX: ESOPHAGOGASTRODUODENOSCOPY (EGD) WITH PROPOFOL: SHX5813

## 2017-04-07 LAB — TYPE AND SCREEN
ABO/RH(D): O NEG
Antibody Screen: NEGATIVE
UNIT DIVISION: 0
UNIT DIVISION: 0

## 2017-04-07 LAB — TROPONIN I
Troponin I: 0.23 ng/mL (ref <0.03)
Troponin I: 0.22 ng/mL (ref <0.03)

## 2017-04-07 LAB — CBC
HCT: 28.3 % — ABNORMAL LOW (ref 36.0–46.0)
HCT: 27.8 % — ABNORMAL LOW (ref 36.0–46.0)
HCT: 29.1 % — ABNORMAL LOW (ref 36.0–46.0)
Hemoglobin: 9 g/dL — ABNORMAL LOW (ref 12.0–15.0)
Hemoglobin: 9 g/dL — ABNORMAL LOW (ref 12.0–15.0)
Hemoglobin: 9.3 g/dL — ABNORMAL LOW (ref 12.0–15.0)
MCH: 24.1 pg — ABNORMAL LOW (ref 26.0–34.0)
MCH: 24.9 pg — AB (ref 26.0–34.0)
MCH: 24.5 pg — ABNORMAL LOW (ref 26.0–34.0)
MCHC: 31.8 g/dL (ref 30.0–36.0)
MCHC: 32.4 g/dL (ref 30.0–36.0)
MCHC: 32 g/dL (ref 30.0–36.0)
MCV: 75.7 fL — ABNORMAL LOW (ref 78.0–100.0)
MCV: 77 fL — ABNORMAL LOW (ref 78.0–100.0)
MCV: 76.8 fL — ABNORMAL LOW (ref 78.0–100.0)
PLATELETS: 320 10*3/uL (ref 150–400)
PLATELETS: 270 10*3/uL (ref 150–400)
PLATELETS: 346 10*3/uL (ref 150–400)
RBC: 3.74 MIL/uL — AB (ref 3.87–5.11)
RBC: 3.61 MIL/uL — ABNORMAL LOW (ref 3.87–5.11)
RBC: 3.79 MIL/uL — ABNORMAL LOW (ref 3.87–5.11)
RDW: 17.1 % — ABNORMAL HIGH (ref 11.5–15.5)
RDW: 17.4 % — AB (ref 11.5–15.5)
RDW: 17.5 % — ABNORMAL HIGH (ref 11.5–15.5)
WBC: 7.3 10*3/uL (ref 4.0–10.5)
WBC: 6.6 10*3/uL (ref 4.0–10.5)
WBC: 7.8 10*3/uL (ref 4.0–10.5)

## 2017-04-07 LAB — COMPREHENSIVE METABOLIC PANEL
ALBUMIN: 2.5 g/dL — AB (ref 3.5–5.0)
ALK PHOS: 47 U/L (ref 38–126)
ALT: 11 U/L — AB (ref 14–54)
AST: 21 U/L (ref 15–41)
Anion gap: 9 (ref 5–15)
BILIRUBIN TOTAL: 0.7 mg/dL (ref 0.3–1.2)
BUN: 15 mg/dL (ref 6–20)
CALCIUM: 8 mg/dL — AB (ref 8.9–10.3)
CO2: 20 mmol/L — AB (ref 22–32)
CREATININE: 0.57 mg/dL (ref 0.44–1.00)
Chloride: 112 mmol/L — ABNORMAL HIGH (ref 101–111)
GFR calc Af Amer: >60 mL/min (ref >60)
GFR calc non Af Amer: >60 mL/min (ref >60)
GLUCOSE: 86 mg/dL (ref 65–99)
Potassium: 3.6 mmol/L (ref 3.5–5.1)
SODIUM: 141 mmol/L (ref 135–145)
TOTAL PROTEIN: 5.2 g/dL — AB (ref 6.5–8.1)

## 2017-04-07 LAB — BPAM RBC
BLOOD PRODUCT EXPIRATION DATE: 201808292359
Blood Product Expiration Date: 201808172359
ISSUE DATE / TIME: 201807271428
ISSUE DATE / TIME: 201807271708
UNIT TYPE AND RH: 9500
Unit Type and Rh: 9500

## 2017-04-07 LAB — ECHOCARDIOGRAM COMPLETE
HEIGHTINCHES: 60 in
WEIGHTICAEL: 1516.76 [oz_av]

## 2017-04-07 SURGERY — ESOPHAGOGASTRODUODENOSCOPY (EGD) WITH PROPOFOL
Anesthesia: Moderate Sedation

## 2017-04-07 MED ORDER — FENTANYL CITRATE (PF) 100 MCG/2ML IJ SOLN
INTRAMUSCULAR | Status: AC
  Filled 2017-04-07: qty 2

## 2017-04-07 MED ORDER — DIPHENHYDRAMINE HCL 50 MG/ML IJ SOLN
INTRAMUSCULAR | Status: AC
  Filled 2017-04-07: qty 1

## 2017-04-07 MED ORDER — FENTANYL CITRATE (PF) 100 MCG/2ML IJ SOLN
25.0000 ug | INTRAMUSCULAR | Status: DC | PRN
Start: 2017-04-07 — End: 2017-04-11

## 2017-04-07 MED ORDER — MIDAZOLAM HCL 2 MG/2ML IJ SOLN
INTRAMUSCULAR | Status: DC | PRN
  Administered 2017-04-07 (×4): 0.5 mg via INTRAVENOUS

## 2017-04-07 MED ORDER — BUTAMBEN-TETRACAINE-BENZOCAINE 2-2-14 % EX AERO
INHALATION_SPRAY | CUTANEOUS | Status: DC | PRN
Start: 2017-04-07 — End: 2017-04-07
  Administered 2017-04-07: 1 via TOPICAL

## 2017-04-07 MED ORDER — MIDAZOLAM HCL 5 MG/ML IJ SOLN
INTRAMUSCULAR | Status: AC
  Filled 2017-04-07: qty 2

## 2017-04-07 MED ORDER — DEXMEDETOMIDINE HCL IN NACL 200 MCG/50ML IV SOLN
INTRAVENOUS | Status: AC
  Filled 2017-04-07: qty 50

## 2017-04-07 MED ORDER — SODIUM CHLORIDE 0.9 % IV SOLN: INTRAVENOUS | Status: DC

## 2017-04-07 SURGICAL SUPPLY — 15 items

## 2017-04-07 NOTE — Progress Notes (Signed)
  Echocardiogram 2D Echocardiogram has been performed.  Katherine Flynn [name] 04/07/2017, 4:16 PM

## 2017-04-07 NOTE — H&P (View-Only) (Signed)
Consultation  Referring Provider: Dr. Adela Lank     Primary Care Physician:  Gordy Savers, MD Primary Gastroenterologist:  Dr. Leone Payor       Reason for Consultation:    Melena, ABLA          HPI:   Katherine Flynn is a 83 y.o. female with past medical history of GERD and multiple others listed below who presented to the ED this morning with complaint of melena.   Today, the patient is accompanied by her daughter and granddaughter, they do assist with her history as she has some trouble with her memory. Daughter explains that the patient is taken care of by caretakers who stay in the home with her. They reported that the patient started with black tarry stools last night, these proceded into today to the point were "they couldn't keep cleaning her out", due to the frequency. Patient has also been complaining of periumbilical pain in her abdomen for about 2 weeks. Daughter does relate occasional Advil use, but is uncertain how often the patient gets this for her various aches and pains as she is not always with her. Associated symptoms include some shortness of breath and weakness.   Previous surgical history is positive for gastrojejunostomy due to a recurrent pyloric stenosis on 08/16/07 (Operative report reviewed).   Patient and family deny fever, chills or chest pain.  Previous GI History: 07/18/07-EGD, Dr. Leone Payor: Impression: recurrent pyloric stenosis  Past Medical History:  Diagnosis Date  . Chronic pain syndrome   . COPD (chronic obstructive pulmonary disease) (HCC)   . GERD (gastroesophageal reflux disease)   . History of pyloric stenosis   . Hypertension   . Macular degeneration   . Osteoarthritis   . Thyroid disease   . Weight loss     Past Surgical History:  Procedure Laterality Date  . APPENDECTOMY    . CATARACT EXTRACTION    . JOINT REPLACEMENT    . nissan fundoplication  08/2007  . ORIF HIP FRACTURE    . TUBAL LIGATION      Family History: No family  history of gastric cancer  Social History  Substance Use Topics  . Smoking status: Never Smoker  . Smokeless tobacco: Never Used  . Alcohol use No    Prior to Admission medications   Medication Sig Start Date End Date Taking? Authorizing Provider  albuterol (PROVENTIL HFA;VENTOLIN HFA) 108 (90 Base) MCG/ACT inhaler Inhale 2 puffs into the lungs every 6 (six) hours as needed for wheezing or shortness of breath. 10/11/16  Yes Tobey Grim, MD  levothyroxine (SYNTHROID, LEVOTHROID) 100 MCG tablet Take 1 tablet (100 mcg total) by mouth daily. 04/28/16  Yes Gordy Savers, MD  metoprolol tartrate (LOPRESSOR) 25 MG tablet Take 1 tablet (25 mg total) by mouth 2 (two) times daily. 04/28/16  Yes Gordy Savers, MD  Multiple Vitamin (MULTIVITAMIN WITH MINERALS) TABS tablet Take 1 tablet by mouth daily.   Yes [provider]    Current Facility-Administered Medications  Medication Dose Route Frequency Provider Last Rate Last Dose  . sodium chloride 0.9 % bolus 1,000 mL  1,000 mL Intravenous Once Melene Plan, DO 1,000 mL/hr at 04/06/17 1051 1,000 mL at 04/06/17 1051   And  . 0.9 %  sodium chloride infusion   Intravenous Continuous Melene Plan, DO      . 0.9 %  sodium chloride infusion   Intravenous Once Melene Plan, DO      . pantoprazole (  PROTONIX) 80 mg in sodium chloride 0.9 % 100 mL IVPB  80 mg Intravenous Once Adela LankFloyd, Dan, DO      . pantoprazole (PROTONIX) 80 mg in sodium chloride 0.9 % 250 mL (0.32 mg/mL) infusion  8 mg/hr Intravenous Continuous Melene PlanFloyd, Dan, DO       Current Outpatient Prescriptions  Medication Sig Dispense Refill  . albuterol (PROVENTIL HFA;VENTOLIN HFA) 108 (90 Base) MCG/ACT inhaler Inhale 2 puffs into the lungs every 6 (six) hours as needed for wheezing or shortness of breath. 1 Inhaler 0  . levothyroxine (SYNTHROID, LEVOTHROID) 100 MCG tablet Take 1 tablet (100 mcg total) by mouth daily. 90 tablet 4  . metoprolol tartrate (LOPRESSOR) 25 MG tablet Take 1  tablet (25 mg total) by mouth 2 (two) times daily. 180 tablet 4  . Multiple Vitamin (MULTIVITAMIN WITH MINERALS) TABS tablet Take 1 tablet by mouth daily.      Allergies as of 04/06/2017  . (No Known Allergies)     Review of Systems:    Constitutional: No weight loss, fever or chills Skin: No rash Cardiovascular: No chest pain Respiratory: Positive for SOB Gastrointestinal: See HPI and otherwise negative Genitourinary: No dysuria Neurological: No headache or dizziness Musculoskeletal: No new muscle or joint pain Hematologic: No bruising Psychiatric: No history of depression or anxiety   Physical Exam:  Vital signs in last 24 hours: Temp:  [98.2 F (36.8 C)] 98.2 F (36.8 C) (07/27 1048) Pulse Rate:  [106] 106 (07/27 1048) Resp:  [18] 18 (07/27 1048) BP: (119)/(63) 119/63 (07/27 1048) SpO2:  [97 %] 97 % (07/27 1048)   General:   Pleasant thin appearing frail elderly Caucasian female appears to be in NAD, Well developed, Well nourished, alert and cooperative Head:  Normocephalic and atraumatic. Eyes:   PEERL, EOMI. No icterus. Conjunctiva Pale . Ears:  Somewhat hard of hearing Neck:  Supple Throat: Oral cavity and pharynx without inflammation, swelling or lesion. Teeth in good condition. Lungs: Respirations even and unlabored. Lungs clear to auscultation bilaterally.   No wheezes, crackles, or rhonchi.  Heart: Normal S1, S2. No MRG. Regular rate and rhythm. No peripheral edemaor cyanosis.sluggish capillary refill, pallor  Abdomen:  Soft, nondistended, mild TTP periumbilical. No rebound or guarding. Normal bowel sounds. No appreciable masses or hepatomegaly. Rectal:  Not performed.  Msk:  Symmetrical without gross deformities. Peripheral pulses intact.  Extremities:  Without edema, no deformity or joint abnormality.   Neurologic:  Alert and  oriented x4;  grossly normal neurologically.  Skin:   Dry and intact without significant lesions or rashes. Psychiatric:  Demonstrates  good judgement and reason without abnormal affect or behaviors.   LAB RESULTS:  Recent Labs  04/06/17 1019 04/06/17 1038  WBC 10.4  --   HGB 6.6* 6.8*  HCT 20.9* 20.0*  PLT 448*  --    BMET  Recent Labs  04/06/17 1019 04/06/17 1038  NA 142 138  K 3.4* 3.3*  CL 103 100*  CO2 28  --   GLUCOSE 158* 155*  BUN 21* 20  CREATININE 0.62 0.60  CALCIUM 8.7*  --    LFT  Recent Labs  04/06/17 1019  PROT 5.9*  ALBUMIN 2.8*  AST 18  ALT 12*  ALKPHOS 55  BILITOT 0.3    PREVIOUS ENDOSCOPIES:            See HPI   Impression / Plan:   Impression: 1. Melena:per daughter who is in the room this started last night and continued into  today and "wouldn't stop", patient has also been complaining of abdominal pain for 2 weeks which is periumbilical in nature, occasional Advil use, uncertain how much as patient is cared for by caregivers who were not present, previous gastrojejunostomy in 2008; consider PUD versus gastritis versus surgical anastomosis ulcer versus other. 2. ABLA:with above, hemoglobin 6.6 at admission  Plan: 1. Agree with serial hgb with transfusion as needed <7 2. Agree with PPI drip 3. Continue supportive measures 4. Increased ordered PRBCs from one unit to 2 units 5. Patient may be on clear liquids today and nothing by mouth after midnight 6. Assuming patient stays stable overnight, we will plan for EGD tomorrow morning. Discussed risks, benefits, limitations and alternatives the patient. She did sign her own consent in the room, her daughter Katherine Flynn was present as well. 7. Please await any final recommendations from Dr. Myrtie Neitheranis  Thank you for your kind consultation, we will continue to follow.  Violet BaldyJennifer Lynne Lemmon  04/06/2017, 11:03 AM Pager #: [phone]   I saw also patient in the ED along with my PA, who acted as Neurosurgeonscribe.  Therefore, I agree with the diagnoses recorded above. In addition,  I have personally interviewed and examined the patient and have  personally reviewed any abdominal/pelvic CT scan images.  My additional thoughts are as follows:  The patient's blood pressure is currently normal, she is mildly tachycardic. There has been no hematemesis.  The patient has severe acute blood loss anemia, though I wonder if she may have been losing blood intermittently over the last week or longer, but just had an increased amount of bleeding in the last 24 hours. We do suspect an ulcer, and an upper endoscopy is planned with the patient has been resuscitated with blood. The patient is alert and conversational incapable of giving her own consent. Thus the procedure was reviewed in detail along with her daughter Katherine Flynn and the patient's nurse Stanford BreedMacon present. Patient at increased risk for cardiopulmonary complications of procedure due to medical comorbidities.   She is an increased risk procedure due to her age and debility.  Charlie PitterHenry L Danis III Pager [phone]  Mon-Fri 8a-5p [phone] after 5p, weekends, holidays

## 2017-04-07 NOTE — Progress Notes (Signed)
CRITICAL VALUE ALERT  Critical Value:  Troponin - 0.23   Date & Time Notied:  04/07/2017 0035  Provider Notified: Arlyss QueenSmith, R.    Orders Received/Actions taken: Assessed Pt. Awaiting further orders. Will continue to monitor.

## 2017-04-07 NOTE — Interval H&P Note (Signed)
History and Physical Interval Note:  04/07/2017 11:46 AM  Katherine ShoutsBarbara A Flynn  has presented today for surgery, with the diagnosis of Melena, ABLA  The various methods of treatment have been discussed with the patient and family. After consideration of risks, benefits and other options for treatment, the patient has consented to  Procedure(s): ESOPHAGOGASTRODUODENOSCOPY (EGD) WITH PROPOFOL (N/A) as a surgical intervention .  The patient's history has been reviewed, patient examined, no change in status, stable for surgery.  I have reviewed the patient's chart and labs.  Questions were answered to the patient's satisfaction.     Charlie PitterHenry L Danis III

## 2017-04-07 NOTE — Op Note (Signed)
[facility] Patient Name: Katherine GitelmanBarbara Flynn Procedure Date: 04/07/2017 MRN: [id number] Attending MD: Starr LakeHenry L. Myrtie Neitheranis , MD Date of Birth: 02/24/1923 CSN: [id number] Age: [age] Admit Type: Inpatient Procedure:                Upper GI endoscopy Indications:              Acute post hemorrhagic anemia, Melena Providers:                Sherilyn CooterHenry L. Myrtie Neitheranis, MD, Harold BarbanLiz Honeycutt, RN, Beryle BeamsJanie                            Billups, Technician Referring MD:              Medicines:                Midazolam 2 mg IV, Cetacaine spray Complications:            No immediate complications. Estimated Blood Loss:     Estimated blood loss: none. Procedure:                Pre-Anesthesia Assessment:                           - Prior to the procedure, a History and Physical                            was performed, and patient medications and                            allergies were reviewed. The patient's tolerance of                            previous anesthesia was also reviewed. The risks                            and benefits of the procedure and the sedation                            options and risks were discussed with the patient.                            All questions were answered, and informed consent                            was obtained. Prior Anticoagulants: The patient has                            taken no previous anticoagulant or antiplatelet                            agents. ASA Grade Assessment: III - A patient with                            severe systemic disease. After reviewing the risks  and benefits, the patient was deemed in                            satisfactory condition to undergo the procedure.                           After obtaining informed consent, the endoscope was                            passed under direct vision. Throughout the                            procedure, the patient's blood pressure, pulse, and        oxygen saturations were monitored continuously. The                            EG-[id number] ([id number]) scope was introduced through the                            mouth, and advanced to the jejunum. The upper GI                            endoscopy was somewhat difficult due to the                            patient's agitation. The patient tolerated the                            procedure. Scope In: Scope Out: Findings:      One moderate benign-appearing, intrinsic stenosis was found at the lower       esophageal sphincter. This measured 1.0 cm (inner diameter) and was       traversed.      Evidence of a gastrojejunostomy was found in the gastric antrum. This       was characterized by ulceration. There was nonbloody bile in the stomach.      One non-bleeding cratered ulcer with adherent clot was found on the       distal aspect of the gastrojejunal anastomosis. The lesion was 20 mm in       largest dimension, deeply cratered with friable tissue. There was a       small adherent clot despite irrigation. The adjacent tissue was friable       with surrounding edema and lumenal deformity, precluding further scope       passage. Sedation was challenging due to patient's age and debility and       no active bleeding was seen, hemoglobin stabilized overnight. Therefore,       the decision was made not to intervene on the ulcer. Impression:               - Benign-appearing esophageal stenosis.                           - A gastrojejunostomy was found, characterized by  ulceration.                           - One non-bleeding jejunal ulcer with adherent clot.                           - No specimens collected. Moderate Sedation:      Moderate (conscious) sedation was administered by the endoscopy nurse       and supervised by the endoscopist. The following parameters were       monitored: oxygen saturation, heart rate, blood pressure, respiratory       rate, EKG,  adequacy of pulmonary ventilation, and response to care.       Total physician intraservice time was 11 minutes. Recommendation:           - Resume regular diet.                           - Continue present medications.                           - No aspirin, ibuprofen, naproxen, or other                            non-steroidal anti-inflammatory drugs. Procedure Code(s):        --- Professional ---                           [phone] [id number], Esophagogastroduodenoscopy, flexible,                            transoral; diagnostic, including collection of                            specimen(s) by brushing or washing, when performed                            (separate procedure)                           99152, Moderate sedation services provided by the                            same physician or other qualified health care                            professional performing the diagnostic or                            therapeutic service that the sedation supports,                            requiring the presence of an independent trained                            observer to assist in the monitoring of the  patient's level of consciousness and physiological                            status; initial 15 minutes of intraservice time,                            patient age 62 years or older Diagnosis Code(s):        --- Professional ---                           K22.2, Esophageal obstruction                           Z98.0, Intestinal bypass and anastomosis status                           K28.4, Chronic or unspecified gastrojejunal ulcer                            with hemorrhage                           D62, Acute posthemorrhagic anemia                           K92.1, Melena (includes Hematochezia) CPT copyright 2016 [employer]. All rights reserved. The codes documented in this report are preliminary and upon coder review may  be revised to meet current  compliance requirements.  L. Myrtie Neither, MD 04/07/2017 12:34:35 PM This report has been signed electronically. Number of Addenda: 0

## 2017-04-07 NOTE — Progress Notes (Addendum)
PROGRESS NOTE    [name]  ZOX:[id number] DOB: 09-26-1922 DOA: 04/06/2017 PCP: Gordy SaversKwiatkowski, Peter F, MD  Brief Narrative: Katherine GitelmanBarbara Flynn  is a 83 y.o. female, w hx of Hypothyroidism, mild Dementia, Gerd, Esophagitis, pyloric stenosis,  h/o nissen fundoplication, diverticulosis , EGD 2008=> pyloric stenosis, and esophagitis. Admitted with melena, acute blood loss anemia, Hb of 6.8   Assessment & Plan:     Upper Gi bleed -h/o pyloric stenosis/esophagitia -intermittent NSAID use -continue IV PPI -leb GI following, plan for EGD today    Acute Blood loss anemia -transfused 2 units PRBC -monitor Hb q12  Elevated troponin -due to demand from anemia -no chest pain, no EKG changes -will check 2D ECHO  Mild Dementia -stable  Hypokalemia -replaced  Hypothyroidism -synthroid  Protein calorie malnutrition (HCC) supplements as tol   COPD -stable, nebs PRN  DVT prophylaxis:SCDs Code Status: DNR Family Communication: None at bedside Disposition Plan: Keep in SDU today  Consultants:   [facility]   Subjective: Feels ok, no chest pain, no dyspnea  Objective: Vitals:   04/07/17 0603 04/07/17 0800 04/07/17 1000 04/07/17 1023  BP: (!) 172/80 (!) 158/81 (!) 214/184 (!) 190/76  Pulse: 87 89 (!) 115 89  Resp: 16 (!) 21 17 (!) 23  Temp:  97.9 F (36.6 C)    TempSrc:  Oral    SpO2: 94% 96% 97% 95%  Weight:      Height:        Intake/Output Summary (Last 24 hours) at 04/07/17 1046 Last data filed at 04/07/17 0800  Gross per 24 hour  Intake          3025.75 ml  Output              750 ml  Net          2275.75 ml   Filed Weights   04/06/17 1229 04/07/17 0245  Weight: 40.1 kg (88 lb 6.5 oz) 43 kg (94 lb 12.8 oz)    Examination:  General exam: Appears calm and comfortable, frail, elderly Respiratory system: Clear to auscultation. Respiratory effort normal. Cardiovascular system: S1 & S2 heard, RRR. No JVD, murmurs, rubs, gallops or clicks. No pedal  edema. Gastrointestinal system: Abdomen is nondistended, soft and nontender. Normal bowel sounds heard. Central nervous system: Alert and oriented. No focal neurological deficits. Extremities: Symmetric 5 x 5 power. Skin: No rashes, lesions or ulcers Psychiatry: Judgement and insight appear normal. Mood & affect appropriate.     Data Reviewed:   CBC:  Recent Labs Lab 04/06/17 1019 04/06/17 1038 04/06/17 2322 04/07/17 0129  WBC 10.4  --  7.3 6.6  HGB 6.6* 6.8* 9.0* 9.0*  HCT 20.9* 20.0* 28.3* 27.8*  MCV 77.4*  --  75.7* 77.0*  PLT 448*  --  320 270   Basic Metabolic Panel:  Recent Labs Lab 04/06/17 1019 04/06/17 1038 04/07/17 0129  NA 142 138 141  K 3.4* 3.3* 3.6  CL 103 100* 112*  CO2 28  --  20*  GLUCOSE 158* 155* 86  BUN 21* 20 15  CREATININE 0.62 0.60 0.57  CALCIUM 8.7*  --  8.0*   GFR: Estimated Creatinine Clearance: 31.1 mL/min (by C-G formula based on SCr of 0.57 mg/dL). Liver Function Tests:  Recent Labs Lab 04/06/17 1019 04/07/17 0129  AST 18 21  ALT 12* 11*  ALKPHOS 55 47  BILITOT 0.3 0.7  PROT 5.9* 5.2*  ALBUMIN 2.8* 2.5*   No results for input(s): LIPASE, AMYLASE in the  last 168 hours. No results for input(s): AMMONIA in the last 168 hours. Coagulation Profile: No results for input(s): INR, PROTIME in the last 168 hours. Cardiac Enzymes:  Recent Labs Lab 04/06/17 1356 04/06/17 2322 04/07/17 0129  TROPONINI 0.04* 0.23* 0.22*   BNP (last 3 results) No results for input(s): PROBNP in the last 8760 hours. HbA1C: No results for input(s): HGBA1C in the last 72 hours. CBG: No results for input(s): GLUCAP in the last 168 hours. Lipid Profile: No results for input(s): CHOL, HDL, LDLCALC, TRIG, CHOLHDL, LDLDIRECT in the last 72 hours. Thyroid Function Tests: No results for input(s): TSH, T4TOTAL, FREET4, T3FREE, THYROIDAB in the last 72 hours. Anemia Panel: No results for input(s): VITAMINB12, FOLATE, FERRITIN, TIBC, IRON, RETICCTPCT in  the last 72 hours. Urine analysis:    Component Value Date/Time   COLORURINE YELLOW 06/05/2016 1600   APPEARANCEUR CLOUDY (A) 06/05/2016 1600   LABSPEC 1.026 06/05/2016 1600   PHURINE 5.5 06/05/2016 1600   GLUCOSEU NEGATIVE 06/05/2016 1600   HGBUR SMALL (A) 06/05/2016 1600   BILIRUBINUR NEGATIVE 06/05/2016 1600   KETONESUR 15 (A) 06/05/2016 1600   PROTEINUR NEGATIVE 06/05/2016 1600   UROBILINOGEN 0.2 04/20/2015 1452   NITRITE POSITIVE (A) 06/05/2016 1600   LEUKOCYTESUR SMALL (A) 06/05/2016 1600   Sepsis Labs: @LABRCNTIP (procalcitonin:4,lacticidven:4)  ) Recent Results (from the past 240 hour(s))  MRSA PCR Screening     Status: Abnormal   Collection Time: 04/06/17 12:30 PM  Result Value Ref Range Status   MRSA by PCR POSITIVE (A) NEGATIVE Final    Comment:        The GeneXpert MRSA Assay (FDA approved for NASAL specimens only), is one component of a comprehensive MRSA colonization surveillance program. It is not intended to diagnose MRSA infection nor to guide or monitor treatment for MRSA infections. RESULT CALLED TO, READ BACK BY AND VERIFIED WITH: A.KOONTZ RN AT 1709 ON 04/06/17 BY S.VANHOORNE MLS          Radiology Studies: No results found.      Scheduled Meds: . Chlorhexidine Gluconate Cloth  6 each Topical Q0600  . levothyroxine  100 mcg Oral QAC breakfast  . metoprolol tartrate  25 mg Oral BID  . multivitamin with minerals  1 tablet Oral Daily  . mupirocin ointment  1 application Nasal BID   Continuous Infusions: . sodium chloride 10 mL/hr at 04/07/17 0800  . sodium chloride    . pantoprozole (PROTONIX) infusion 8 mg/hr (04/07/17 0820)     LOS: 1 day    Time spent: 35min    Zannie CovePreetha Audreyana Huntsberry, MD [facility] Pager [phone]  If 7PM-7AM, please contact night-coverage [email or web address] Password [email or web address] 04/07/2017, 10:46 AM

## 2017-04-08 LAB — BASIC METABOLIC PANEL
ANION GAP: 8 (ref 5–15)
BUN: 9 mg/dL (ref 6–20)
CALCIUM: 8.3 mg/dL — AB (ref 8.9–10.3)
CO2: 23 mmol/L (ref 22–32)
Chloride: 107 mmol/L (ref 101–111)
Creatinine, Ser: 0.67 mg/dL (ref 0.44–1.00)
GFR calc Af Amer: >60 mL/min (ref >60)
GFR calc non Af Amer: >60 mL/min (ref >60)
GLUCOSE: 78 mg/dL (ref 65–99)
POTASSIUM: 3.1 mmol/L — AB (ref 3.5–5.1)
Sodium: 138 mmol/L (ref 135–145)

## 2017-04-08 LAB — CBC
HEMATOCRIT: 30 % — AB (ref 36.0–46.0)
Hemoglobin: 9.5 g/dL — ABNORMAL LOW (ref 12.0–15.0)
MCH: 24.6 pg — AB (ref 26.0–34.0)
MCHC: 31.7 g/dL (ref 30.0–36.0)
MCV: 77.7 fL — AB (ref 78.0–100.0)
Platelets: 375 10*3/uL (ref 150–400)
RBC: 3.86 MIL/uL — ABNORMAL LOW (ref 3.87–5.11)
RDW: 17.3 % — AB (ref 11.5–15.5)
WBC: 8.8 10*3/uL (ref 4.0–10.5)

## 2017-04-08 MED ORDER — SODIUM CHLORIDE 0.9 % IV SOLN
125.0000 mg | Freq: Once | INTRAVENOUS | Status: AC
  Administered 2017-04-08: 125 mg via INTRAVENOUS
  Filled 2017-04-08: qty 10

## 2017-04-08 NOTE — Progress Notes (Addendum)
PROGRESS NOTE    Katherine Flynn  ZOX:[id number] DOB: 08-17-1923 DOA: 04/06/2017 PCP: Gordy SaversKwiatkowski, Peter F, MD  Brief Narrative: Katherine GitelmanBarbara Flynn  is a 83 y.o. female, w hx of Hypothyroidism, mild Dementia, Gerd, Esophagitis, pyloric stenosis,  h/o nissen fundoplication, diverticulosis , EGD 2008=> pyloric stenosis, and esophagitis. Admitted with melena, acute blood loss anemia, Hb of 6.8, underwent EGD, noted to have a gastrojejunal anastomotic ulcer likely NSAID related, stable after transfusion  Assessment & Plan:     Upper Gi bleed -due to Gastro-jejunal anastomosis ulcer, likely NSAID related -h/o intermittent NSAID use -improved s/p Blood, EGD and PPI, Hb stable now -Advance diet, continue protonix gtt today, change to by mouth twice a day in a.m. -Appreciate  GI input -Ambulate, out of bed, physical therapy consult    Acute Blood loss anemia -transfused 2 units PRBC on 7/27 -Hemoglobin stable now  Elevated troponin -due to demand ischemia from anemia -no chest pain, no EKG changes -2-D echo shows EF of 55-60% and no wall motion abnormalities, no plan for further workup at this time  Mild Dementia -stable  Hypokalemia -replaced  Hypothyroidism -synthroid  Protein calorie malnutrition (HCC) supplements as tolerated   COPD -stable, nebs PRN  DVT prophylaxis:SCDs Code Status: DNR Family Communication: None at bedside Disposition Plan: floor, home tomorrow if stable  Consultants:   [facility]  Procedures -EGD Impression:               - Benign-appearing esophageal stenosis.                           - A gastrojejunostomy was found, characterized by                            ulceration.                           - One non-bleeding jejunal ulcer with adherent clot.                           - No specimens collected.  Subjective: Feels ok, no chest pain, no dyspnea  Objective: Vitals:   04/07/17 1424 04/07/17 1600 04/07/17 2114 04/08/17 0535    BP: (!) 164/98 (!) 170/64 (!) 159/65 (!) 155/67  Pulse: 91 77 76 65  Resp:  (!) 21 18 18   Temp:   98.7 F (37.1 C) 98.7 F (37.1 C)  TempSrc:   Oral Oral  SpO2:  95% 98% 98%  Weight:      Height:        Intake/Output Summary (Last 24 hours) at 04/08/17 1136 Last data filed at 04/08/17 0800  Gross per 24 hour  Intake              430 ml  Output                0 ml  Net              430 ml   Filed Weights   04/06/17 1229 04/07/17 0245  Weight: 40.1 kg (88 lb 6.5 oz) 43 kg (94 lb 12.8 oz)    Examination:   Gen: Frail elderly white female, laying in bed, no distress, she is alert awake oriented to self and place HEENT: PERRLA, Neck supple, no JVD Lungs: Decreased breath sounds at the bases  CVS: RRR,No Gallops,Rubs or new Murmurs Abd: soft, Non tender, slightly distended, BS present Extremities: No Cyanosis, Clubbing or edema Skin: no new rashes, large healed scab left lower leg   Data Reviewed:   CBC:  Recent Labs Lab 04/06/17 1019 04/06/17 1038 04/06/17 2322 04/07/17 0129 04/07/17 1705 04/08/17 0435  WBC 10.4  --  7.3 6.6 7.8 8.8  HGB 6.6* 6.8* 9.0* 9.0* 9.3* 9.5*  HCT 20.9* 20.0* 28.3* 27.8* 29.1* 30.0*  MCV 77.4*  --  75.7* 77.0* 76.8* 77.7*  PLT 448*  --  320 270 346 375   Basic Metabolic Panel:  Recent Labs Lab 04/06/17 1019 04/06/17 1038 04/07/17 0129 04/08/17 0435  NA 142 138 141 138  K 3.4* 3.3* 3.6 3.1*  CL 103 100* 112* 107  CO2 28  --  20* 23  GLUCOSE 158* 155* 86 78  BUN 21* 20 15 9   CREATININE 0.62 0.60 0.57 0.67  CALCIUM 8.7*  --  8.0* 8.3*   GFR: Estimated Creatinine Clearance: 31.1 mL/min (by C-G formula based on SCr of 0.67 mg/dL). Liver Function Tests:  Recent Labs Lab 04/06/17 1019 04/07/17 0129  AST 18 21  ALT 12* 11*  ALKPHOS 55 47  BILITOT 0.3 0.7  PROT 5.9* 5.2*  ALBUMIN 2.8* 2.5*   No results for input(s): LIPASE, AMYLASE in the last 168 hours. No results for input(s): AMMONIA in the last 168  hours. Coagulation Profile: No results for input(s): INR, PROTIME in the last 168 hours. Cardiac Enzymes:  Recent Labs Lab 04/06/17 1356 04/06/17 2322 04/07/17 0129  TROPONINI 0.04* 0.23* 0.22*   BNP (last 3 results) No results for input(s): PROBNP in the last 8760 hours. HbA1C: No results for input(s): HGBA1C in the last 72 hours. CBG: No results for input(s): GLUCAP in the last 168 hours. Lipid Profile: No results for input(s): CHOL, HDL, LDLCALC, TRIG, CHOLHDL, LDLDIRECT in the last 72 hours. Thyroid Function Tests: No results for input(s): TSH, T4TOTAL, FREET4, T3FREE, THYROIDAB in the last 72 hours. Anemia Panel: No results for input(s): VITAMINB12, FOLATE, FERRITIN, TIBC, IRON, RETICCTPCT in the last 72 hours. Urine analysis:    Component Value Date/Time   COLORURINE YELLOW 06/05/2016 1600   APPEARANCEUR CLOUDY (A) 06/05/2016 1600   LABSPEC 1.026 06/05/2016 1600   PHURINE 5.5 06/05/2016 1600   GLUCOSEU NEGATIVE 06/05/2016 1600   HGBUR SMALL (A) 06/05/2016 1600   BILIRUBINUR NEGATIVE 06/05/2016 1600   KETONESUR 15 (A) 06/05/2016 1600   PROTEINUR NEGATIVE 06/05/2016 1600   UROBILINOGEN 0.2 04/20/2015 1452   NITRITE POSITIVE (A) 06/05/2016 1600   LEUKOCYTESUR SMALL (A) 06/05/2016 1600   Sepsis Labs: @LABRCNTIP (procalcitonin:4,lacticidven:4)  ) Recent Results (from the past 240 hour(s))  MRSA PCR Screening     Status: Abnormal   Collection Time: 04/06/17 12:30 PM  Result Value Ref Range Status   MRSA by PCR POSITIVE (A) NEGATIVE Final    Comment:        The GeneXpert MRSA Assay (FDA approved for NASAL specimens only), is one component of a comprehensive MRSA colonization surveillance program. It is not intended to diagnose MRSA infection nor to guide or monitor treatment for MRSA infections. RESULT CALLED TO, READ BACK BY AND VERIFIED WITH: A.KOONTZ RN AT 1709 ON 04/06/17 BY S.VANHOORNE MLS          Radiology Studies: No results  found.      Scheduled Meds: . Chlorhexidine Gluconate Cloth  6 each Topical Q0600  . levothyroxine  100 mcg Oral QAC breakfast  .  metoprolol tartrate  25 mg Oral BID  . multivitamin with minerals  1 tablet Oral Daily  . mupirocin ointment  1 application Nasal BID   Continuous Infusions: . pantoprozole (PROTONIX) infusion 8 mg/hr (04/07/17 2225)     LOS: 2 days    Time spent: 35min    Zannie CovePreetha Denica Web, MD [facility] Pager [phone]  If 7PM-7AM, please contact night-coverage [email or web address] Password [email or web address] 04/08/2017, 11:36 AM

## 2017-04-08 NOTE — Progress Notes (Addendum)
[facility] Progress Note  Chief Complaint: gastric ulcer   Subjective  History:  Patient denies abd pain or vomiting. No reported hematemesis or melena. Eating soft diet without difficulty  ROS: Cardiovascular:  no chest pain Respiratory: no dyspnea  Objective:  Med list reviewed  Vital signs in last 24 hrs: Vitals:   04/07/17 2114 04/08/17 0535  BP: (!) 159/65 (!) 155/67  Pulse: 76 65  Resp: 18 18  Temp: 98.7 F (37.1 C) 98.7 F (37.1 C)    Physical Exam Frail elderly woman resting comfortably, awakens easily  HEENT: sclera anicteric, oral mucosa moist without lesions  Neck: supple, no thyromegaly, JVD or lymphadenopathy  Cardiac: RRR, S1S2 heard, no peripheral edema  Pulm: clear to auscultation bilaterally, normal RR and effort noted  Abdomen: soft, no tenderness, with active bowel sounds. No guarding or palpable hepatosplenomegaly  Skin; warm and dry, no jaundice or rash  Recent Labs:   Recent Labs Lab 04/07/17 0129 04/07/17 1705 04/08/17 0435  WBC 6.6 7.8 8.8  HGB 9.0* 9.3* 9.5*  HCT 27.8* 29.1* 30.0*  PLT 270 346 375    Recent Labs Lab 04/07/17 0129 04/08/17 0435  NA 141 138  K 3.6 3.1*  CL 112* 107  CO2 20* 23  BUN 15 9  ALBUMIN 2.5*  --   ALKPHOS 47  --   ALT 11*  --   AST 21  --   GLUCOSE 86 78     @ASSESSMENTPLANBEGIN @ Assessment: Gastro-jejunal anastomosis ulcer, suspect NSAIDs Acute blood loss anemia Esophageal stricture  No ongoing bleeding.  Plan: Soft diet for 2 weeks while ulcer starts to heal Protonix drip until tomorrow AM, then twice daily oral protonix 40 mg Daily Hgb.   She can most likely be discharged tomorrow to see me or our APP in 3-4 weeks.Would need repeat Hgb a week after discharge with PCP.  I ordered her some IV iron today.   Charlie PitterHenry L Danis III Pager [phone] Mon-Fri 8a-5p [phone] after 5p, weekends, holidays

## 2017-04-09 ENCOUNTER — Encounter (HOSPITAL_COMMUNITY): Payer: Self-pay | Admitting: Gastroenterology

## 2017-04-09 LAB — BASIC METABOLIC PANEL
ANION GAP: 10 (ref 5–15)
ANION GAP: 9 (ref 5–15)
BUN: 7 mg/dL (ref 6–20)
BUN: 10 mg/dL (ref 6–20)
CO2: 23 mmol/L (ref 22–32)
CO2: 22 mmol/L (ref 22–32)
CREATININE: 0.58 mg/dL (ref 0.44–1.00)
Calcium: 8.2 mg/dL — ABNORMAL LOW (ref 8.9–10.3)
Calcium: 8.1 mg/dL — ABNORMAL LOW (ref 8.9–10.3)
Chloride: 106 mmol/L (ref 101–111)
Chloride: 104 mmol/L (ref 101–111)
Creatinine, Ser: 0.6 mg/dL (ref 0.44–1.00)
GLUCOSE: 127 mg/dL — AB (ref 65–99)
Glucose, Bld: 74 mg/dL (ref 65–99)
POTASSIUM: 3.2 mmol/L — AB (ref 3.5–5.1)
Potassium: 2.7 mmol/L — CL (ref 3.5–5.1)
SODIUM: 139 mmol/L (ref 135–145)
Sodium: 135 mmol/L (ref 135–145)

## 2017-04-09 LAB — CBC
HEMATOCRIT: 30.8 % — AB (ref 36.0–46.0)
Hemoglobin: 9.8 g/dL — ABNORMAL LOW (ref 12.0–15.0)
MCH: 24.7 pg — ABNORMAL LOW (ref 26.0–34.0)
MCHC: 31.8 g/dL (ref 30.0–36.0)
MCV: 77.8 fL — ABNORMAL LOW (ref 78.0–100.0)
PLATELETS: 392 10*3/uL (ref 150–400)
RBC: 3.96 MIL/uL (ref 3.87–5.11)
RDW: 17.2 % — AB (ref 11.5–15.5)
WBC: 7.3 10*3/uL (ref 4.0–10.5)

## 2017-04-09 MED ORDER — ENSURE ENLIVE PO LIQD
237.0000 mL | Freq: Two times a day (BID) | ORAL | Status: DC
  Administered 2017-04-09 – 2017-04-14 (×7): 237 mL via ORAL

## 2017-04-09 MED ORDER — POTASSIUM CHLORIDE CRYS ER 20 MEQ PO TBCR
40.0000 meq | EXTENDED_RELEASE_TABLET | ORAL | Status: AC
  Administered 2017-04-09 (×2): 40 meq via ORAL
  Filled 2017-04-09 (×2): qty 2

## 2017-04-09 MED ORDER — POTASSIUM CHLORIDE 10 MEQ/100ML IV SOLN
10.0000 meq | INTRAVENOUS | Status: AC
  Administered 2017-04-09 (×3): 10 meq via INTRAVENOUS
  Filled 2017-04-09 (×5): qty 100

## 2017-04-09 MED ORDER — PANTOPRAZOLE SODIUM 40 MG PO TBEC
40.0000 mg | DELAYED_RELEASE_TABLET | Freq: Two times a day (BID) | ORAL | Status: DC
  Administered 2017-04-09 – 2017-04-10 (×4): 40 mg via ORAL
  Filled 2017-04-09 (×4): qty 1

## 2017-04-09 NOTE — Evaluation (Signed)
Occupational Therapy Evaluation Patient Details Name: Katherine ShoutsBarbara A Flynn MRN: [id number] DOB: 05-06-1923 Today's Date: 04/09/2017    History of Present Illness 83 yo female admitted with GI bleed, anemia. Hx of multiple falls, low back pain, OA, COPD, GERD.    Clinical Impression   Pt admitted with GI bleed. Pt currently with functional limitations due to the deficits listed below (see OT Problem List). Pt will benefit from skilled OT to increase their safety and independence with ADL and functional mobility for ADL to facilitate discharge to venue listed below.      Follow Up Recommendations  SNF;Supervision/Assistance - 24 hour          Precautions / Restrictions Precautions Precautions: Fall Restrictions Weight Bearing Restrictions: No      Mobility Bed Mobility Overal bed mobility: Needs Assistance Bed Mobility: Supine to Sit;Sit to Supine     Supine to sit: Min assist Sit to supine: Min assist   General bed mobility comments: Assist for LEs back onto bed. Increased time. Effortful for pt.   Transfers Overall transfer level: Needs assistance Equipment used: Rolling walker (2 wheeled) Transfers: Sit to/from UGI CorporationStand;Stand Pivot Transfers Sit to Stand: Min assist Stand pivot transfers: Min assist       General transfer comment: Assist to rise, stabilize, control descent. VCs safety, hand placement. Increased time.     Balance Overall balance assessment: Needs assistance;History of Falls         Standing balance support: Bilateral upper extremity supported Standing balance-Leahy Scale: Poor Standing balance comment: requires RW                           ADL either performed or assessed with clinical judgement   ADL Overall ADL's : Needs assistance/impaired Eating/Feeding: Set up;Sitting   Grooming: Minimal assistance;Sitting;Cueing for safety;Cueing for sequencing   Upper Body Bathing: Minimal assistance;Sitting   Lower Body Bathing:  Moderate assistance;Sit to/from stand;Cueing for safety;Cueing for sequencing   Upper Body Dressing : Set up;Cueing for safety;Cueing for sequencing   Lower Body Dressing: Moderate assistance;Sit to/from stand;Cueing for safety;Cueing for sequencing   Toilet Transfer: Cueing for safety;Cueing for sequencing;Moderate assistance   Toileting- Clothing Manipulation and Hygiene: Moderate assistance;Sit to/from stand;Cueing for safety;Cueing for sequencing         General ADL Comments: posterior lean in sitting and standing.       Vision Patient Visual Report: No change from baseline              Pertinent Vitals/Pain Pain Assessment: Faces Faces Pain Scale: Hurts a little bit Pain Location:  R hand Pain Descriptors / Indicators: Sore;Aching Pain Intervention(s): Limited activity within patient's tolerance;Monitored during session     Hand Dominance     Extremity/Trunk Assessment Upper Extremity Assessment Upper Extremity Assessment: Generalized weakness   Lower Extremity Assessment Lower Extremity Assessment: Generalized weakness   Cervical / Trunk Assessment Cervical / Trunk Assessment: Kyphotic   Communication Communication Communication: No difficulties   Cognition Arousal/Alertness: Awake/alert Behavior During Therapy: WFL for tasks assessed/performed;Anxious (pt anxious to go home. OT explained reasons pt cant go home) Overall Cognitive Status: Within Functional Limits for tasks assessed                                                Home Living Family/patient expects to be discharged  to:: Private residence Living Arrangements: Alone Available Help at Discharge: Personal care attendant (mornings daily (per pt)) Type of Home: House Home Access: Stairs to enter Enter[employer] CorporationEntrance Stairs-Number of Steps: 3-4 Entrance Stairs-Rails: Right;Left Home Layout: One level     Bathroom Shower/Tub: Chief Strategy OfficerTub/shower unit   Bathroom Toilet: Standard     Home  Equipment: Environmental consultantWalker - 2 wheels;Shower seat;Grab bars - tub/shower          Prior Functioning/Environment Level of Independence: Needs assistance  Gait / Transfers Assistance Needed: Uses RW for ambulation ADL's / Homemaking Assistance Needed: per pt, she has an aide in the mornings            OT Problem List: Decreased strength;Decreased activity tolerance;Decreased safety awareness;Decreased knowledge of use of DME or AE      OT Treatment/Interventions: Self-care/ADL training;Patient/family education;DME and/or AE instruction    OT Goals(Current goals can be found in the care plan section) Acute Rehab OT Goals Patient Stated Goal: home OT Goal Formulation: With patient Time For Goal Achievement: 04/23/17 Potential to Achieve Goals: Good  OT Frequency: Min 2X/week   Barriers to Flynn/C:               AM-PAC PT "6 Clicks" Daily Activity     Outcome Measure Help from another person eating meals?: A Little Help from another person taking care of personal grooming?: A Little Help from another person toileting, which includes using toliet, bedpan, or urinal?: A Little Help from another person bathing (including washing, rinsing, drying)?: A Lot Help from another person to put on and taking off regular upper body clothing?: A Little Help from another person to put on and taking off regular lower body clothing?: A Lot 6 Click Score: 16   End of Session Equipment Utilized During Treatment: Rolling walker  Activity Tolerance: Patient tolerated treatment well Patient left: in bed;with call bell/phone within reach;with family/visitor present  OT Visit Diagnosis: Unsteadiness on feet (R26.81);Muscle weakness (generalized) (M62.81);Repeated falls (R29.6)                Time: [id number] OT Time Calculation (min): 15 min Charges:  OT General Charges $OT Visit: 1 Procedure OT Evaluation $OT Eval Moderate Complexity: 1 Procedure G-Codes:     Lise AuerLori Palmer Shorey,  OT [phone]  Einar CrowEDDING, Katherine Flynn 04/09/2017, 1:41 PM

## 2017-04-09 NOTE — Discharge Instructions (Signed)

## 2017-04-09 NOTE — Progress Notes (Signed)
PROGRESS NOTE    Katherine Flynn  ZOX:[id number] DOB: 09/22/1922 DOA: 04/06/2017 PCP: Katherine SaversKwiatkowski, Peter F, MD  Brief Narrative: Katherine GitelmanBarbara Flynn  is a 83 y.o. female, w hx of Hypothyroidism, mild Dementia, Gerd, Esophagitis, pyloric stenosis,  h/o nissen fundoplication, diverticulosis , EGD 2008=> pyloric stenosis, and esophagitis. Admitted with melena, acute blood loss anemia, Hb of 6.8, underwent EGD, noted to have a gastrojejunal anastomotic ulcer likely NSAID related, stable after transfusion  Assessment & Plan:     Upper Gi bleed -due to Gastro-jejunal anastomosis ulcer, likely NSAID related -h/o intermittent NSAID use -improved s/p Blood, EGD and PPI, Hb stable now -tolerating diet, changed PPI to PO -Appreciate [facility] input -PT recommends SNF-attempting to reach family to see if this is acceptable, pt is very weak and unsteady    Acute Blood loss anemia -transfused 2 units PRBC on 7/27 -Hemoglobin stable now  Elevated troponin -due to demand ischemia from anemia -no chest pain, no EKG changes -2-D echo shows EF of 55-60% and no wall motion abnormalities, no plan for further workup at this time  Mild Dementia -stable  Hypokalemia -replaced  Hypothyroidism -synthroid  Protein calorie malnutrition (HCC) supplements as tolerated   COPD -stable, nebs PRN  DVT prophylaxis:SCDs Code Status: DNR Family Communication: None at bedside Disposition Plan: SNF if acceptable   Consultants:   [facility]  Procedures -EGD Impression:               - Benign-appearing esophageal stenosis.                           - A gastrojejunostomy was found, characterized by                            ulceration.                           - One non-bleeding jejunal ulcer with adherent clot.                           - No specimens collected.  Subjective: No complaints, some weakness, breathing ok  Objective: Vitals:   04/08/17 1357 04/08/17 2137 04/09/17 0457 04/09/17  1033  BP: (!) 166/80 (!) 176/72 (!) 155/76 (!) 158/74  Pulse: 78 92 76 72  Resp: 15 14 18    Temp: 98.2 Flynn (36.8 C) 98.7 Flynn (37.1 C) 98.2 Flynn (36.8 C)   TempSrc: Oral Oral Oral   SpO2: 98% 98% 97%   Weight:      Height:        Intake/Output Summary (Last 24 hours) at 04/09/17 1417 Last data filed at 04/09/17 1350  Gross per 24 hour  Intake           952.92 ml  Output               75 ml  Net           877.92 ml   Filed Weights   04/06/17 1229 04/07/17 0245  Weight: 40.1 kg (88 lb 6.5 oz) 43 kg (94 lb 12.8 oz)    Examination:   Gen: Frail elderly white female, laying in bed, no distress, she is alert awake oriented to self and place HEENT: PERRLA, Neck supple, no JVD Lungs: Decreased breath sounds at the bases CVS: RRR,No Gallops,Rubs or new Murmurs  Abd: soft, Non tender, slightly distended, BS present Extremities: No Cyanosis, Clubbing or edema Skin: no new rashes, large healed scab left lower leg   Data Reviewed:   CBC:  Recent Labs Lab 04/06/17 2322 04/07/17 0129 04/07/17 1705 04/08/17 0435 04/09/17 0428  WBC 7.3 6.6 7.8 8.8 7.3  HGB 9.0* 9.0* 9.3* 9.5* 9.8*  HCT 28.3* 27.8* 29.1* 30.0* 30.8*  MCV 75.7* 77.0* 76.8* 77.7* 77.8*  PLT 320 270 346 375 392   Basic Metabolic Panel:  Recent Labs Lab 04/06/17 1019 04/06/17 1038 04/07/17 0129 04/08/17 0435 04/09/17 0428 04/09/17 1231  NA 142 138 141 138 139 135  K 3.4* 3.3* 3.6 3.1* 2.7* 3.2*  CL 103 100* 112* 107 106 104  CO2 28  --  20* 23 23 22   GLUCOSE 158* 155* 86 78 74 127*  BUN 21* 20 15 9 7 10   CREATININE 0.62 0.60 0.57 0.67 0.58 0.60  CALCIUM 8.7*  --  8.0* 8.3* 8.2* 8.1*   GFR: Estimated Creatinine Clearance: 31.1 mL/min (by C-G formula based on SCr of 0.6 mg/dL). Liver Function Tests:  Recent Labs Lab 04/06/17 1019 04/07/17 0129  AST 18 21  ALT 12* 11*  ALKPHOS 55 47  BILITOT 0.3 0.7  PROT 5.9* 5.2*  ALBUMIN 2.8* 2.5*   No results for input(s): LIPASE, AMYLASE in the last 168  hours. No results for input(s): AMMONIA in the last 168 hours. Coagulation Profile: No results for input(s): INR, PROTIME in the last 168 hours. Cardiac Enzymes:  Recent Labs Lab 04/06/17 1356 04/06/17 2322 04/07/17 0129  TROPONINI 0.04* 0.23* 0.22*   BNP (last 3 results) No results for input(s): PROBNP in the last 8760 hours. HbA1C: No results for input(s): HGBA1C in the last 72 hours. CBG: No results for input(s): GLUCAP in the last 168 hours. Lipid Profile: No results for input(s): CHOL, HDL, LDLCALC, TRIG, CHOLHDL, LDLDIRECT in the last 72 hours. Thyroid Function Tests: No results for input(s): TSH, T4TOTAL, FREET4, T3FREE, THYROIDAB in the last 72 hours. Anemia Panel: No results for input(s): VITAMINB12, FOLATE, FERRITIN, TIBC, IRON, RETICCTPCT in the last 72 hours. Urine analysis:    Component Value Date/Time   COLORURINE YELLOW 06/05/2016 1600   APPEARANCEUR CLOUDY (A) 06/05/2016 1600   LABSPEC 1.026 06/05/2016 1600   PHURINE 5.5 06/05/2016 1600   GLUCOSEU NEGATIVE 06/05/2016 1600   HGBUR SMALL (A) 06/05/2016 1600   BILIRUBINUR NEGATIVE 06/05/2016 1600   KETONESUR 15 (A) 06/05/2016 1600   PROTEINUR NEGATIVE 06/05/2016 1600   UROBILINOGEN 0.2 04/20/2015 1452   NITRITE POSITIVE (A) 06/05/2016 1600   LEUKOCYTESUR SMALL (A) 06/05/2016 1600   Sepsis Labs: @LABRCNTIP (procalcitonin:4,lacticidven:4)  ) Recent Results (from the past 240 hour(s))  MRSA PCR Screening     Status: Abnormal   Collection Time: 04/06/17 12:30 PM  Result Value Ref Range Status   MRSA by PCR POSITIVE (A) NEGATIVE Final    Comment:        The GeneXpert MRSA Assay (FDA approved for NASAL specimens only), is one component of a comprehensive MRSA colonization surveillance program. It is not intended to diagnose MRSA infection nor to guide or monitor treatment for MRSA infections. RESULT CALLED TO, READ BACK BY AND VERIFIED WITH: A.KOONTZ RN AT 1709 ON 04/06/17 BY S.VANHOORNE MLS           Radiology Studies: No results found.      Scheduled Meds: . Chlorhexidine Gluconate Cloth  6 each Topical Q0600  . feeding supplement (ENSURE ENLIVE)  237  mL Oral BID BM  . levothyroxine  100 mcg Oral QAC breakfast  . metoprolol tartrate  25 mg Oral BID  . multivitamin with minerals  1 tablet Oral Daily  . mupirocin ointment  1 application Nasal BID  . pantoprazole  40 mg Oral BID   Continuous Infusions:    LOS: 3 days    Time spent: 35min    Zannie CovePreetha Voshon Petro, MD [facility] Pager [phone]  If 7PM-7AM, please contact night-coverage [email or web address] Password [email or web address] 04/09/2017, 2:17 PM

## 2017-04-09 NOTE — Evaluation (Signed)
Physical Therapy Evaluation Patient Details Name: Katherine Flynn MRN: [id number] DOB: 06-22-1923 Today's Date: 04/09/2017   History of Present Illness  83 yo female admitted with GI bleed, anemia. Hx of multiple falls, low back pain, OA, COPD, GERD.   Clinical Impression  On eval, pt required Min assist for mobility. She walked ~75 feet with a RW. Pt is unsteady and at risk for falls. She presents with general weakness, decreased activity tolerance, and impaired gait and balance. Per pt, she lives alone and has an aide in the mornings. Unfortunately, based on pt's current condition, that may not be adequate supervision/assist for a safe d/c back home. At [facility] time[facility] Will follow and progress activity as tolerated.     Follow Up Recommendations SNF    Equipment Recommendations  None recommended by PT    Recommendations for Other Services OT consult     Precautions / Restrictions Precautions Precautions: Fall Restrictions Weight Bearing Restrictions: No      Mobility  Bed Mobility Overal bed mobility: Needs Assistance Bed Mobility: Supine to Sit;Sit to Supine     Supine to sit: Min guard;HOB elevated Sit to supine: Min assist;HOB elevated   General bed mobility comments: Assist for LEs back onto bed. Increased time. Effortful for pt.   Transfers Overall transfer level: Needs assistance Equipment used: Rolling walker (2 wheeled) Transfers: Sit to/from Stand Sit to Stand: Min assist         General transfer comment: Assist to rise, stabilize, control descent. VCs safety, hand placement. Increased time.   Ambulation/Gait Ambulation/Gait assistance: Min assist Ambulation Distance (Feet): 75 Feet Assistive device: Rolling walker (2 wheeled) Gait Pattern/deviations: Step-to pattern;Step-through pattern;Decreased stride length;Trunk flexed;Drifts right/left     General Gait Details: Assist to stabilize pt and maneuver safely  with RW. Pt is unsteady and at risk for falls. Pt fatigues easily. Dyspnea 2/4. 1 standing rest break needed/taken after ~40 feet.   Stairs            Wheelchair Mobility    Modified Rankin (Stroke Patients Only)       Balance Overall balance assessment: Needs assistance;History of Falls         Standing balance support: Bilateral upper extremity supported Standing balance-Leahy Scale: Poor Standing balance comment: requires RW                             Pertinent Vitals/Pain Pain Assessment: Faces Faces Pain Scale: Hurts little more Pain Location: R hand (at IV site), legs Pain Descriptors / Indicators: Sore;Aching Pain Intervention(s): Limited activity within patient's tolerance;Repositioned    Home Living Family/patient expects to be discharged to:: Private residence Living Arrangements: Alone Available Help at Discharge: Personal care attendant (mornings daily (per pt)) Type of Home: House Home Access: Stairs to enter Entrance Stairs-Rails: Doctor, general practiceight;Left Entrance Stairs-Number of Steps: 3-4 Home Layout: One level Home Equipment: Environmental consultantWalker - 2 wheels;Shower seat;Grab bars - tub/shower      Prior Function Level of Independence: Needs assistance   Gait / Transfers Assistance Needed: Uses RW for ambulation  ADL's / Homemaking Assistance Needed: per pt, she has an aide in the mornings        Hand Dominance        Extremity/Trunk Assessment   Upper Extremity Assessment Upper Extremity Assessment: Generalized weakness    Lower Extremity Assessment Lower Extremity Assessment: Generalized weakness    Cervical / Trunk Assessment Cervical /  Trunk Assessment: Kyphotic  Communication   Communication: No difficulties  Cognition Arousal/Alertness: Awake/alert Behavior During Therapy: WFL for tasks assessed/performed Overall Cognitive Status: Within Functional Limits for tasks assessed                                         General Comments      Exercises     Assessment/Plan    PT Assessment Patient needs continued PT services  PT Problem List Decreased strength;Decreased mobility;Decreased activity tolerance;Decreased balance;Decreased knowledge of use of DME;Pain       PT Treatment Interventions DME instruction;Gait training;Therapeutic activities;Therapeutic exercise;Functional mobility training;Balance training;Patient/family education    PT Goals (Current goals can be found in the Care Plan section)  Acute Rehab PT Goals Patient Stated Goal: home PT Goal Formulation: With patient Time For Goal Achievement: 04/23/17 Potential to Achieve Goals: Good    Frequency Min 3X/week   Barriers to discharge        Co-evaluation               AM-PAC PT "6 Clicks" Daily Activity  Outcome Measure Difficulty turning over in bed (including adjusting bedclothes, sheets and blankets)?: A Little Difficulty moving from lying on back to sitting on the side of the bed? : A Little Difficulty sitting down on and standing up from a chair with arms (e.g., wheelchair, bedside commode, etc,.)?: Total Help needed moving to and from a bed to chair (including a wheelchair)?: A Little Help needed walking in hospital room?: A Little Help needed climbing 3-5 steps with a railing? : A Little 6 Click Score: 16    End of Session Equipment Utilized During Treatment: Gait belt Activity Tolerance: Patient limited by fatigue Patient left: in bed;with call bell/phone within reach;with bed alarm set   PT Visit Diagnosis: Muscle weakness (generalized) (M62.81);Difficulty in walking, not elsewhere classified (R26.2)    Time: 1100-1120 PT Time Calculation (min) (ACUTE ONLY): 20 min   Charges:   PT Evaluation $PT Eval Low Complexity: 1 Procedure     PT G Codes:          Rebeca AlertJannie Danasha Melman, MPT Pager: [phone]

## 2017-04-09 NOTE — Clinical Social Work Note (Signed)
Clinical Social Work Assessment  Patient Details  Name: Katherine Flynn MRN: [id number] Date of Birth: 01/30/1923  Date of referral:  04/09/17               Reason for consult:  Facility Placement                Permission sought to share information with:    Permission granted to share information::  Yes, Verbal Permission Granted  Name::        Agency::     Relationship::     Contact Information:     Housing/Transportation Living arrangements for the past 2 months:  Single Family Home Source of Information:  Patient Patient Interpreter Needed:  None Criminal Activity/Legal Involvement Pertinent to Current Situation/Hospitalization:    Significant Relationships:  Adult Children Lives with:  Self Do you feel safe going back to the place where you live?  Yes Need for family participation in patient care:  No (Coment)  Care giving concerns:  Pt is hard of hearing and refused to let CSW call family at this time.   Social Worker assessment / plan:  CSW met with pt and confirmed pt's plan to be possibly consider being discharged to SNF.  CSW provided active listening and validated pt's concerns that family not be notified by CSW at this time.   Patient gave CSW Dept verbal permission towill complete FL-2 and send referrals out to SNF facilities via the hub per pt's request, but still desires to be consulted once open beds are known.  Pt has been living independently prior to being admitted to [facility].  Employment status:  Retired Insurance information:  Managed Medicare PT Recommendations:  Skilled Nursing Facility Information / Referral to community resources:     Patient/Family's Response to care:  Patient alert and oriented.  Patient agreeable to plan.  Per pt pt's children are supportive and strongly involved in pt.'s care, but CSW was not given permission to contact them.  Pt pleasant and appreciated CSW intervention.    Patient/Family's Understanding of and Emotional Response to  Diagnosis, Current Treatment, and Prognosis:  Still assessing   Emotional Assessment Appearance:  Appears stated age Attitude/Demeanor/Rapport:    Affect (typically observed):  Accepting, Adaptable, Calm, Pleasant Orientation:  Oriented to Self, Oriented to Place, Oriented to  Time, Oriented to Situation Alcohol / Substance use:    Psych involvement (Current and /or in the community):     Discharge Needs  Concerns to be addressed:  No discharge needs identified Readmission within the last 30 days:  No Current discharge risk:  None Barriers to Discharge:  No Barriers Identified    F , LCSWA 04/09/2017, 7:32 PM  

## 2017-04-09 NOTE — Progress Notes (Signed)
Progress Note   Subjective  Chief Complaint: Gastric ulcer  Today, the patient is found sitting up in bed eating her soft diet, she tells me that she feels well, with no further abdominal pain and no further black stools. She tells me that she is just a little bit "crotchity", mainly because "my oatmeal was cold". Patient is excited about the prospect of going home today. She denies any new concerns.    Objective   Vital signs in last 24 hours: Temp:  [98.2 F (36.8 C)-98.7 F (37.1 C)] 98.2 F (36.8 C) (07/30 0457) Pulse Rate:  [76-92] 76 (07/30 0457) Resp:  [14-18] 18 (07/30 0457) BP: (155-176)/(72-80) 155/76 (07/30 0457) SpO2:  [97 %-98 %] 97 % (07/30 0457) Last BM Date: 04/07/17 General:    Elderly Caucasian female in NAD Heart:  Regular rate and rhythm; no murmurs Lungs: Respirations even and unlabored, lungs CTA bilaterally Abdomen:  Soft, nontender and nondistended. Normal bowel sounds. Extremities:  Without edema. Neurologic:  Alert and oriented,  grossly normal neurologically. Psych:  Cooperative. Normal mood and affect.  Intake/Output from previous day: 07/29 0701 - 07/30 0700 In: 1202.9 [P.O.:120; I.V.:982.9; IV Piggyback:100] Out: -  Intake/Output this shift: Total I/O In: 360 [P.O.:360] Out: -   Lab Results:  Recent Labs  04/07/17 1705 04/08/17 0435 04/09/17 0428  WBC 7.8 8.8 7.3  HGB 9.3* 9.5* 9.8*  HCT 29.1* 30.0* 30.8*  PLT 346 375 392   BMET  Recent Labs  04/07/17 0129 04/08/17 0435 04/09/17 0428  NA 141 138 139  K 3.6 3.1* 2.7*  CL 112* 107 106  CO2 20* 23 23  GLUCOSE 86 78 74  BUN 15 9 7   CREATININE 0.57 0.67 0.58  CALCIUM 8.0* 8.3* 8.2*   LFT  Recent Labs  04/07/17 0129  PROT 5.2*  ALBUMIN 2.5*  AST 21  ALT 11*  ALKPHOS 47  BILITOT 0.7   EGD, 04/07/17, Dr. Myrtie Neitheranis: Impression:                  - Benign-appearing esophageal stenosis.                           - A gastrojejunostomy was found, characterized by                      ulceration.                           - One non-bleeding jejunal ulcer with adherent clot.                           - No specimens collected. Recommendation:    - Resume regular diet.                           - Continue present medications.                           - No aspirin, ibuprofen, naproxen, or other                            non-steroidal anti-inflammatory drugs.   Assessment / Plan:   Assessment: 1. Gastrojejunal anastomosis ulcer 2. Acute blood loss anemia 3. Esophageal stricture  Plan:  1. Patient is tolerating a soft diet this morning and should continue this for at least 2 weeks while her ulcer starts to heal as an outpatient 2. Agree with change to oral Protonix 40 mg by mouth twice a day, she should stay on this until further notice at time of follow up with our clinic 3. Patient can be discharged today, I will arrange for an outpatient appointment in our clinic in 3-4 weeks with either myself or Dr. Myrtie Neitheranis 4. Patient will need a repeat hemoglobin 1 week after discharge with her PCP 5. Please await any final recommendations from Dr. Lavon PaganiniNandigam  Thank you for your kind consultation, we will sign off today    LOS: 3 days   Unk LightningJennifer Lynne Lemmon  04/09/2017, 10:10 AM  Pager # [phone]   Attending physician's note   I have taken an interval history, reviewed the chart and examined the patient. I agree with the Advanced Practitioner's note, impression and recommendations.  GJ anastomosis ulcer. Patient tolerating diet well. Hgb stable. Continue PPI BID. Avoid NSAID's. Follow up in office Will sign off, please call with any questions  K Scherry RanVeena Lowell Mcgurk, MD [phone] Mon-Fri 8a-5p [phone] after 5p, weekends, holidays

## 2017-04-09 NOTE — Progress Notes (Signed)
Initial Nutrition Assessment  DOCUMENTATION CODES:   Not applicable  INTERVENTION:   MVI daily   Ensure Enlive po BID, each supplement provides 350 kcal and 20 grams of protein  NUTRITION DIAGNOSIS:   Malnutrition (Severe) related to chronic illness (COPD) as evidenced by severe depletion of body fat, severe depletion of muscle mass.  GOAL:   Patient will meet greater than or equal to 90% of their needs  MONITOR:   PO intake, Supplement acceptance, Labs, Weight trends  REASON FOR ASSESSMENT:   Rounds  ASSESSMENT:   Pt with PMH of COPD, multiple falls, esophagitis, pyloric stents, nissen fundoplication, diverticulosis, and hypothyroidism. Presets this admission with GI bleed with gastrojejunal anastomosis ulcer.   Pt currently consuming 33 % of her last 3 meals, tolerating soft diet. Reports a decrease in appetite for 3 months due to abdominal pain and increased weakness. States she typically eats 2-3 meals per day that has dropped to bites over the last 3 months. Pt does not consume protein supplementation at home. Suspect pt meets criteria for decreased energy intake for malnutrition.   Pt reports no unintentional wt loss. Records indicate a steady wt within the last year ranging from 94-98 lb.   Nutrition-Focused physical exam completed. Findings are severe fat depletion in the orbital and thoracic/lumbar regions, severe muscle depletion in the upper and lower extremities, and mild edema.   Medications reviewed and include: synthroid, MVI, KCl Labs reviewed: K 2.7 (L), Ca 8.2 (L), CBG 74-155  Diet Order:  DIET SOFT Room service appropriate? Yes; Fluid consistency: Thin  Skin:  Reviewed, no issues  Last BM:  04/07/17  Height:   Ht Readings from Last 1 Encounters:  04/06/17 5' (1.524 m)    Weight:   Wt Readings from Last 1 Encounters:  04/07/17 94 lb 12.8 oz (43 kg)    Ideal Body Weight:  45.5 kg  BMI:  Body mass index is 18.51 kg/m.  Estimated  Nutritional Needs:   Kcal:  1300-1500 (30-35 kcal/kg)  Protein:  75-85 grams (1.7-2 g/kg)  Fluid:  >1.3 L/day  EDUCATION NEEDS:   No education needs identified at this time  Vanessa Kickarly Corinda Ammon RD, LDN Clinical Nutrition Pager # - [phone]

## 2017-04-10 LAB — BASIC METABOLIC PANEL
ANION GAP: 8 (ref 5–15)
BUN: 20 mg/dL (ref 6–20)
CO2: 25 mmol/L (ref 22–32)
Calcium: 8.5 mg/dL — ABNORMAL LOW (ref 8.9–10.3)
Chloride: 104 mmol/L (ref 101–111)
Creatinine, Ser: 0.56 mg/dL (ref 0.44–1.00)
GLUCOSE: 86 mg/dL (ref 65–99)
POTASSIUM: 4.1 mmol/L (ref 3.5–5.1)
Sodium: 137 mmol/L (ref 135–145)

## 2017-04-10 LAB — CBC
HCT: 30.4 % — ABNORMAL LOW (ref 36.0–46.0)
HEMATOCRIT: 30.6 % — AB (ref 36.0–46.0)
HEMOGLOBIN: 9.6 g/dL — AB (ref 12.0–15.0)
Hemoglobin: 9.8 g/dL — ABNORMAL LOW (ref 12.0–15.0)
MCH: 24.8 pg — ABNORMAL LOW (ref 26.0–34.0)
MCH: 24.2 pg — ABNORMAL LOW (ref 26.0–34.0)
MCHC: 32 g/dL (ref 30.0–36.0)
MCHC: 31.6 g/dL (ref 30.0–36.0)
MCV: 77.5 fL — AB (ref 78.0–100.0)
MCV: 76.6 fL — ABNORMAL LOW (ref 78.0–100.0)
PLATELETS: 386 10*3/uL (ref 150–400)
Platelets: 433 10*3/uL — ABNORMAL HIGH (ref 150–400)
RBC: 3.95 MIL/uL (ref 3.87–5.11)
RBC: 3.97 MIL/uL (ref 3.87–5.11)
RDW: 17.4 % — ABNORMAL HIGH (ref 11.5–15.5)
RDW: 17.2 % — ABNORMAL HIGH (ref 11.5–15.5)
WBC: 6.4 10*3/uL (ref 4.0–10.5)
WBC: 6.3 10*3/uL (ref 4.0–10.5)

## 2017-04-10 MED ORDER — PANTOPRAZOLE SODIUM 40 MG PO TBEC: 40.0000 mg | DELAYED_RELEASE_TABLET | Freq: Two times a day (BID) | ORAL | 0 refills | Status: DC

## 2017-04-10 NOTE — Care Management Important Message (Addendum)
Important Message  Patient Details IM Letter given to Cookie/Case Management to present to Patient Name: Katherine ShoutsBarbara A Lichty MRN: [id number] Date of Birth: 04-19-1923   Medicare Important Message Given:  Yes    Caren MacadamFuller, Rechelle Niebla 04/10/2017, 9:58 AMImportant Message  Patient Details  Name: Katherine ShoutsBarbara A Beevers MRN: [id number] Date of Birth: 04-19-1923   Medicare Important Message Given:  Yes    Caren MacadamFuller, Isobelle Tuckett 04/10/2017, 9:58 AM

## 2017-04-10 NOTE — [place] FL2 (Signed)
[place] MEDICAID FL2 LEVEL OF CARE SCREENING TOOL     IDENTIFICATION  Patient Name: Katherine Flynn Birthdate: [birth year] Sex: female Admission Date (Current Location): 04/06/2017  [facility] [place]   Facility and Address[facility]  [facility],  [address], [place] [address]      Provider Number: [id number]  Attending Physician Name and Address:  Zannie CoveJoseph, Preetha, MD  Relative Name and Phone Number:       Current Level of Care: Hospital Recommended Level of Care: Skilled Nursing Facility Prior Approval Number:    Date Approved/Denied:   PASRR Number: [id number]  Discharge Plan: SNF    Current Diagnoses: Patient Active Problem List   Diagnosis Date Noted  . GI bleeding 04/06/2017  . Tachycardia 04/06/2017  . UTI (lower urinary tract infection) 05/01/2016  . Dehydration 05/01/2016  . Multiple pelvic fractures 11/04/2015  . Fall at home 11/04/2015  . Prolonged Q-T interval on ECG 11/04/2015  . RBBB 11/04/2015  . Dilated bile duct 11/04/2015  . Pancreatic duct dilated 11/04/2015  . Abnormal CT of the abdomen 11/04/2015  . Fall 04/19/2015  . Protein-calorie malnutrition, severe (HCC) 04/19/2015  . Closed fracture of right iliac wing (HCC) 04/18/2015  . Peri-prosthetic fracture around prosthetic hip 04/18/2015  . Protein calorie malnutrition (HCC) 04/18/2015  . Hypokalemia 11/24/2014  . Iron deficiency anemia 12/30/2012  . Chronic bronchitis with COPD (chronic obstructive pulmonary disease) (HCC) 11/19/2007  . UTI'S, RECURRENT 11/19/2007  . HEEL SPUR 11/19/2007  . Hypothyroidism 11/04/2007  . Essential hypertension 11/04/2007  . GERD 11/04/2007  . Osteoarthritis 11/04/2007  . WEIGHT LOSS 11/04/2007  . PYLORIC STENOSIS 07/18/2007  . OTHER OBSTRUCTION OF DUODENUM 08/10/2006    Orientation RESPIRATION BLADDER Height & Weight     Self, Time, Situation, Place  Normal External catheter, Incontinent Weight: 98 lb 8.7 oz (44.7  kg) Height:  5' (152.4 cm)  BEHAVIORAL SYMPTOMS/MOOD NEUROLOGICAL BOWEL NUTRITION STATUS      Continent Diet (soft diet)  AMBULATORY STATUS COMMUNICATION OF NEEDS Skin   Limited Assist Verbally Other (Comment) (non pressure wound right leg)                       Personal Care Assistance Level of Assistance  Bathing, Feeding, Dressing Bathing Assistance: Limited assistance Feeding assistance: Independent Dressing Assistance: Limited assistance     Functional Limitations Info  Sight, Hearing, Speech Sight Info: Adequate Hearing Info: Adequate Speech Info: Adequate    SPECIAL CARE FACTORS FREQUENCY  PT (By licensed PT), OT (By licensed OT)     PT Frequency: 5x OT Frequency: 5x            Contractures Contractures Info: Not present    Additional Factors Info  Code Status, Allergies, Isolation Precautions Code Status Info: DNR Allergies Info: nka     Isolation Precautions Info: contact precautions; Positive MRSA obtained from nasal specimen     Current Medications (04/10/2017):  This is the current hospital active medication list Current Facility-Administered Medications  Medication Dose Route Frequency Provider Last Rate Last Dose  . acetaminophen (TYLENOL) tablet 650 mg  650 mg Oral Q6H PRN Pearson GrippeKim, James, MD   650 mg at 04/09/17 1140   Or  . acetaminophen (TYLENOL) suppository 650 mg  650 mg Rectal Q6H PRN Pearson GrippeKim, James, MD      . albuterol (PROVENTIL) (2.5 MG/3ML) 0.083% nebulizer solution 2.5 mg  2.5 mg Nebulization Q6H PRN Pearson GrippeKim, James, MD      .  Chlorhexidine Gluconate Cloth 2 % PADS 6 each  6 each Topical Q0600 Pearson GrippeKim, James, MD   6 each at 04/10/17 0600  . diphenhydrAMINE (BENADRYL) capsule 25 mg  25 mg Oral QHS PRN Crissie ReeseSmith, Robert, MD   25 mg at 04/09/17 2107  . feeding supplement (ENSURE ENLIVE) (ENSURE ENLIVE) liquid 237 mL  237 mL Oral BID BM Zannie CoveJoseph, Preetha, MD   237 mL at 04/10/17 1048  . fentaNYL (SUBLIMAZE) injection 25-50 mcg  25-50 mcg Intravenous Q5 min PRN  Kipp BroodJoslin, David, MD      . labetalol (NORMODYNE,TRANDATE) injection 5 mg  5 mg Intravenous Q2H PRN Crissie ReeseSmith, Robert, MD   5 mg at 04/06/17 2104  . levothyroxine (SYNTHROID, LEVOTHROID) tablet 100 mcg  100 mcg Oral QAC breakfast Pearson GrippeKim, James, MD   100 mcg at 04/10/17 [id number]  . metoprolol tartrate (LOPRESSOR) tablet 25 mg  25 mg Oral BID Pearson GrippeKim, James, MD   25 mg at 04/10/17 1047  . multivitamin with minerals tablet 1 tablet  1 tablet Oral Daily Pearson GrippeKim, James, MD   1 tablet at 04/10/17 1047  . mupirocin ointment (BACTROBAN) 2 % 1 application  1 application Nasal BID Pearson GrippeKim, James, MD   1 application at 04/10/17 1047  . pantoprazole (PROTONIX) EC tablet 40 mg  40 mg Oral BID Zannie CoveJoseph, Preetha, MD   40 mg at 04/10/17 1047     Discharge Medications: Please see discharge summary for a list of discharge medications.  Relevant Imaging Results:  Relevant Lab Results:   Additional Information SSN [id number]  Nelwyn SalisburyMeghan R Esterlene Atiyeh, LCSW

## 2017-04-10 NOTE — Progress Notes (Addendum)
Met with pt to discuss referrals to SNF (see assessment completed by PM CSW). Pt states she is agreeable to SNF but unable to discuss facilities of preference and details due to drowsiness. Provided permission to contact son and daughter- CSW called both but unable to leave voicemail as voicemail box is not set up per recording. Completed FL2 and referred to area facilities. Will follow up with pt/family for bed offers. Pt will need [facility] preauthorization once facility chosen.  Sharren Bridge, MSW, LCSW Clinical Social Work 04/10/2017 [phone]  1500- spoke with son and provided SNF bed offers. [facility] (pt has been there in the past).  Selected facility in the [place] and spoke with facility admissions representative who states she will contact business office to begin [place].

## 2017-04-10 NOTE — Discharge Summary (Addendum)
Physician Discharge Summary  Katherine Flynn WUJ:[id number] DOB: 06-09-1923 DOA: 04/06/2017  PCP: Gordy SaversKwiatkowski, Peter F, MD  Admit date: 04/06/2017 Discharge date: 04/10/2017  Time spent: 35 minutes  Recommendations for Outpatient Follow-up:  1. PCP in 1 week, please check CBC at follow up  Discharge Diagnoses:    Upper Gi Bleed   Gastro-jejunal anastomosis ulcer   Acute Blood loss anemia   Hypothyroidism   Elevated troponin   Mild Dementia   COPD   Severe malnutrition   GERD   Hypokalemia   Protein calorie malnutrition (HCC)   GI bleeding   Tachycardia   Discharge Condition: stable  Diet recommendation: heart healthy  Filed Weights   04/06/17 1229 04/07/17 0245 04/10/17 0647  Weight: 40.1 kg (88 lb 6.5 oz) 43 kg (94 lb 12.8 oz) 44.7 kg (98 lb 8.7 oz)    History of present illness:  BarbaraBottomleyis a 83 y.o.female,w hx of Hypothyroidism, mild Dementia, Gerd, Esophagitis, pyloric stenosis, h/onissen fundoplication, diverticulosis , EGD 2008=>pyloric stenosis, and esophagitis. Admitted with melena, acute blood loss anemia, Hb of 6.8  Hospital Course:  Upper Gi bleed -Started on IV PPI, [facility] consulted, underwent EGD which noted Gastro-jejunal anastomosis ulcer, likely NSAID related -h/o intermittent NSAID use -improved s/p Blood, EGD and PPI, Hb stable now -tolerating diet, changed PPI to PO -had some maroon stool this am but repeat Hb stable    Acute Blood loss anemia -transfused 2 units PRBC on 7/27 -Hemoglobin stable now  Elevated troponin -due to demand ischemia from anemia -no chest pain, no EKG changes -2-D echo shows EF of 55-60% and no wall motion abnormalities, no plan for further workup at this time  Mild Dementia -stable  Hypokalemia -replaced  Hypothyroidism -synthroid  Protein calorie malnutrition (HCC) supplements as tolerated   COPD -stable, nebs PRN  Code Status: DNR  Consultants:   [facility]  Procedures -EGD Impression: - Benign-appearing esophageal stenosis. - A gastrojejunostomy was found, characterized by  ulceration. - One non-bleeding jejunal ulcer with adherent clot. - No specimens collected.  Consultations:  [facility]  Discharge Exam: Vitals:   04/10/17 0647 04/10/17 1308  BP: (!) 170/75 (!) 154/80  Pulse: 88 68  Resp: 18 18  Temp: 98.1 F (36.7 C) 97.9 F (36.6 C)    General: AAOx3 Cardiovascular: S1S2/RRR Respiratory: CTAB  Discharge Instructions   Discharge Instructions    Diet - low sodium heart healthy    Complete by:  As directed    Increase activity slowly    Complete by:  As directed      Current Discharge Medication List    START taking these medications   Details  pantoprazole (PROTONIX) 40 MG tablet Take 1 tablet (40 mg total) by mouth 2 (two) times daily. Qty: 60 tablet, Refills: 0      CONTINUE these medications which have NOT CHANGED   Details  albuterol (PROVENTIL HFA;VENTOLIN HFA) 108 (90 Base) MCG/ACT inhaler Inhale 2 puffs into the lungs every 6 (six) hours as needed for wheezing or shortness of breath. Qty: 1 Inhaler, Refills: 0    levothyroxine (SYNTHROID, LEVOTHROID) 100 MCG tablet Take 1 tablet (100 mcg total) by mouth daily. Qty: 90 tablet, Refills: 4    metoprolol tartrate (LOPRESSOR) 25 MG tablet Take 1 tablet (25 mg total) by mouth 2 (two) times daily. Qty: 180 tablet, Refills: 4    Multiple Vitamin (MULTIVITAMIN WITH MINERALS) TABS tablet Take 1 tablet by mouth daily.       No  Known Allergies  Contact information for follow-up providers    Gordy SaversKwiatkowski, Peter F, MD. Schedule an appointment as soon as possible for a visit in 1 week(s).   Specialty:  Internal Medicine Contact information: [address] [place] [address] [phone]            Contact information for  after-discharge care    Destination    [facility] Follow up.   Specialty:  Skilled Nursing Facility Contact information: [address] [place] [id number] [phone]                   The results of significant diagnostics from this hospitalization (including imaging, microbiology, ancillary and laboratory) are listed below for reference.    Significant Diagnostic Studies: No results found.  Microbiology: Recent Results (from the past 240 hour(s))  MRSA PCR Screening     Status: Abnormal   Collection Time: 04/06/17 12:30 PM  Result Value Ref Range Status   MRSA by PCR POSITIVE (A) NEGATIVE Final    Comment:        The GeneXpert MRSA Assay (FDA approved for NASAL specimens only), is one component of a comprehensive MRSA colonization surveillance program. It is not intended to diagnose MRSA infection nor to guide or monitor treatment for MRSA infections. RESULT CALLED TO, READ BACK BY AND VERIFIED WITH: A.KOONTZ RN AT 1709 ON 04/06/17 BY S.VANHOORNE MLS      Labs: Basic Metabolic Panel:  Recent Labs Lab 04/07/17 0129 04/08/17 0435 04/09/17 0428 04/09/17 1231 04/10/17 1050  NA 141 138 139 135 137  K 3.6 3.1* 2.7* 3.2* 4.1  CL 112* 107 106 104 104  CO2 20* 23 23 22 25   GLUCOSE 86 78 74 127* 86  BUN 15 9 7 10 20   CREATININE 0.57 0.67 0.58 0.60 0.56  CALCIUM 8.0* 8.3* 8.2* 8.1* 8.5*   Liver Function Tests:  Recent Labs Lab 04/06/17 1019 04/07/17 0129  AST 18 21  ALT 12* 11*  ALKPHOS 55 47  BILITOT 0.3 0.7  PROT 5.9* 5.2*  ALBUMIN 2.8* 2.5*   No results for input(s): LIPASE, AMYLASE in the last 168 hours. No results for input(s): AMMONIA in the last 168 hours. CBC:  Recent Labs Lab 04/07/17 0129 04/07/17 1705 04/08/17 0435 04/09/17 0428 04/10/17 1050  WBC 6.6 7.8 8.8 7.3 6.4  HGB 9.0* 9.3* 9.5* 9.8* 9.8*  HCT 27.8* 29.1* 30.0* 30.8* 30.6*  MCV 77.0* 76.8* 77.7* 77.8* 77.5*  PLT 270 346  375 392 386   Cardiac Enzymes:  Recent Labs Lab 04/06/17 1356 04/06/17 2322 04/07/17 0129  TROPONINI 0.04* 0.23* 0.22*   BNP: BNP (last 3 results) No results for input(s): BNP in the last 8760 hours.  ProBNP (last 3 results) No results for input(s): PROBNP in the last 8760 hours.  CBG: No results for input(s): GLUCAP in the last 168 hours.     SignedZannie Cove:  Dylin Breeden MD.  [facility] 04/10/2017, 3:55 PM

## 2017-04-11 ENCOUNTER — Inpatient Hospital Stay (HOSPITAL_COMMUNITY): Payer: Medicare HMO | Admitting: Anesthesiology

## 2017-04-11 ENCOUNTER — Encounter (HOSPITAL_COMMUNITY): Payer: Self-pay | Admitting: *Deleted

## 2017-04-11 ENCOUNTER — Encounter (HOSPITAL_COMMUNITY)
Admission: EM | Disposition: A | Discharge status: Skilled Nursing Facility | Payer: Self-pay | Source: Home / Self Care | Attending: Internal Medicine

## 2017-04-11 ENCOUNTER — Inpatient Hospital Stay (HOSPITAL_COMMUNITY): Payer: Medicare HMO

## 2017-04-11 DIAGNOSIS — K920 Hematemesis: Secondary | ICD-10-CM

## 2017-04-11 DIAGNOSIS — K922 Gastrointestinal hemorrhage, unspecified: Secondary | ICD-10-CM

## 2017-04-11 DIAGNOSIS — K254 Chronic or unspecified gastric ulcer with hemorrhage: Secondary | ICD-10-CM

## 2017-04-11 DIAGNOSIS — K250 Acute gastric ulcer with hemorrhage: Secondary | ICD-10-CM

## 2017-04-11 HISTORY — PX: ESOPHAGOGASTRODUODENOSCOPY (EGD) WITH PROPOFOL: SHX5813

## 2017-04-11 LAB — CBC
HCT: 25.3 % — ABNORMAL LOW (ref 36.0–46.0)
HEMATOCRIT: 23.3 % — AB (ref 36.0–46.0)
HEMOGLOBIN: 7.5 g/dL — AB (ref 12.0–15.0)
Hemoglobin: 8.1 g/dL — ABNORMAL LOW (ref 12.0–15.0)
MCH: 25.2 pg — ABNORMAL LOW (ref 26.0–34.0)
MCH: 25.2 pg — AB (ref 26.0–34.0)
MCHC: 32 g/dL (ref 30.0–36.0)
MCHC: 32.2 g/dL (ref 30.0–36.0)
MCV: 78.8 fL (ref 78.0–100.0)
MCV: 78.2 fL (ref 78.0–100.0)
Platelets: 403 10*3/uL — ABNORMAL HIGH (ref 150–400)
Platelets: 383 10*3/uL (ref 150–400)
RBC: 3.21 MIL/uL — ABNORMAL LOW (ref 3.87–5.11)
RBC: 2.98 MIL/uL — AB (ref 3.87–5.11)
RDW: 17.7 % — ABNORMAL HIGH (ref 11.5–15.5)
RDW: 18 % — ABNORMAL HIGH (ref 11.5–15.5)
WBC: 17.3 10*3/uL — ABNORMAL HIGH (ref 4.0–10.5)
WBC: 6.7 10*3/uL (ref 4.0–10.5)

## 2017-04-11 LAB — BASIC METABOLIC PANEL
Anion gap: 9 (ref 5–15)
BUN: 22 mg/dL — ABNORMAL HIGH (ref 6–20)
CO2: 23 mmol/L (ref 22–32)
Calcium: 8.1 mg/dL — ABNORMAL LOW (ref 8.9–10.3)
Chloride: 106 mmol/L (ref 101–111)
Creatinine, Ser: 0.68 mg/dL (ref 0.44–1.00)
GFR calc Af Amer: >60 mL/min (ref >60)
GFR calc non Af Amer: >60 mL/min (ref >60)
Glucose, Bld: 116 mg/dL — ABNORMAL HIGH (ref 65–99)
Potassium: 3.6 mmol/L (ref 3.5–5.1)
Sodium: 138 mmol/L (ref 135–145)

## 2017-04-11 LAB — PREPARE RBC (CROSSMATCH)

## 2017-04-11 SURGERY — ESOPHAGOGASTRODUODENOSCOPY (EGD) WITH PROPOFOL
Anesthesia: Monitor Anesthesia Care

## 2017-04-11 MED ORDER — SODIUM CHLORIDE 0.9 % IV BOLUS (SEPSIS)
500.0000 mL | Freq: Once | INTRAVENOUS | Status: AC
  Administered 2017-04-11: 500 mL via INTRAVENOUS

## 2017-04-11 MED ORDER — SODIUM CHLORIDE 0.9 % IV SOLN
8.0000 mg/h | INTRAVENOUS | Status: AC
  Administered 2017-04-11 – 2017-04-13 (×5): 8 mg/h via INTRAVENOUS
  Filled 2017-04-11 (×13): qty 80

## 2017-04-11 MED ORDER — SODIUM CHLORIDE 0.9 % IV SOLN
Freq: Once | INTRAVENOUS | Status: AC
  Administered 2017-04-11: 15:00:00 via INTRAVENOUS

## 2017-04-11 MED ORDER — SODIUM CHLORIDE 0.9 % IJ SOLN
PREFILLED_SYRINGE | INTRAMUSCULAR | Status: DC | PRN
  Administered 2017-04-11: 3 mL

## 2017-04-11 MED ORDER — LIDOCAINE 2% (20 MG/ML) 5 ML SYRINGE
INTRAMUSCULAR | Status: DC | PRN
  Administered 2017-04-11: 20 mg via INTRAVENOUS

## 2017-04-11 MED ORDER — SODIUM CHLORIDE 0.9 % IV SOLN
INTRAVENOUS | Status: DC
  Administered 2017-04-11 – 2017-04-13 (×2): via INTRAVENOUS

## 2017-04-11 MED ORDER — EPINEPHRINE PF 1 MG/10ML IJ SOSY
PREFILLED_SYRINGE | INTRAMUSCULAR | Status: AC
  Filled 2017-04-11: qty 10

## 2017-04-11 MED ORDER — SODIUM CHLORIDE 0.9 % IV SOLN
80.0000 mg | Freq: Once | INTRAVENOUS | Status: AC
  Administered 2017-04-11: 80 mg via INTRAVENOUS
  Filled 2017-04-11: qty 80

## 2017-04-11 MED ORDER — PROPOFOL 10 MG/ML IV BOLUS
INTRAVENOUS | Status: DC | PRN
  Administered 2017-04-11: 10 mg via INTRAVENOUS
  Administered 2017-04-11: 20 mg via INTRAVENOUS
  Administered 2017-04-11: 10 mg via INTRAVENOUS
  Administered 2017-04-11 (×3): 20 mg via INTRAVENOUS

## 2017-04-11 MED ORDER — PANTOPRAZOLE SODIUM 40 MG IV SOLR: 40.0000 mg | Freq: Two times a day (BID) | INTRAVENOUS | Status: DC

## 2017-04-11 MED ORDER — LIDOCAINE 2% (20 MG/ML) 5 ML SYRINGE
INTRAMUSCULAR | Status: AC
  Filled 2017-04-11: qty 5

## 2017-04-11 SURGICAL SUPPLY — 15 items

## 2017-04-11 NOTE — Anesthesia Preprocedure Evaluation (Addendum)
Anesthesia Evaluation  Patient identified by MRN, date of birth, ID band Patient awake    Reviewed: Allergy & Precautions, NPO status , Patient's Chart, lab work & pertinent test results, reviewed documented beta blocker date and time   Airway Mallampati: III  TM Distance: >3 FB Neck ROM: Full    Dental  (+) Dental Advisory Given, Edentulous Lower, Edentulous Upper   Pulmonary COPD,  COPD inhaler,    Pulmonary exam normal breath sounds clear to auscultation       Cardiovascular hypertension, Pt. on home beta blockers Normal cardiovascular exam+ dysrhythmias (RBBB)  Rhythm:Regular Rate:Normal  04/07/17 TTE:  Study Conclusions  - Left ventricle: The cavity size was normal. Wall thickness was increased in a pattern of mild LVH. Systolic function was normal. The estimated ejection fraction was in the range of 60% to 65%. Doppler parameters are consistent with abnormal left ventricular   relaxation (grade 1 diastolic dysfunction). - Aortic valve: There was mild regurgitation. - Mitral valve: There was mild to moderate regurgitation. - Left atrium: The atrium was mildly dilated. - Pulmonary arteries: PA peak pressure: 40 mm Hg (S).   Neuro/Psych negative neurological ROS  negative psych ROS   GI/Hepatic Neg liver ROS, GERD  Medicated,melena and acute blood loss anemia Jejunal ulcer   Endo/Other  Hypothyroidism   Renal/GU negative Renal ROS     Musculoskeletal  (+) Arthritis ,   Abdominal   Peds  Hematology  (+) Blood dyscrasia, anemia ,   Anesthesia Other Findings Day of surgery medications reviewed with the patient.  Reproductive/Obstetrics                            Anesthesia Physical Anesthesia Plan  ASA: III  Anesthesia Plan: MAC   Post-op Pain Management:    Induction: Intravenous  PONV Risk Score and Plan: 2 and Treatment may vary due to age or medical condition and Propofol  infusion  Airway Management Planned: Nasal Cannula  Additional Equipment:   Intra-op Plan:   Post-operative Plan:   Informed Consent: I have reviewed the patients History and Physical, chart, labs and discussed the procedure including the risks, benefits and alternatives for the proposed anesthesia with the patient or authorized representative who has indicated his/her understanding and acceptance.   Dental advisory given  Plan Discussed with: CRNA and Anesthesiologist  Anesthesia Plan Comments: (Discussed risks/benefits/alternatives to MAC sedation including need for ventilatory support, hypotension, need for conversion to general anesthesia.  All patient questions answered.  Patient/guardian wishes to proceed.)        Anesthesia Quick Evaluation

## 2017-04-11 NOTE — Transfer of Care (Signed)
Immediate Anesthesia Transfer of Care Note  Patient: Katherine Flynn  Procedure(s) Performed: Procedure(s): ESOPHAGOGASTRODUODENOSCOPY (EGD) WITH PROPOFOL (N/A)  Patient Location: PACU  Anesthesia Type:MAC  Level of Consciousness:  sedated, patient cooperative and responds to stimulation  Airway & Oxygen Therapy:Patient Spontanous Breathing and Patient connected to face mask oxgen  Post-op Assessment:  Report given to PACU RN and Post -op Vital signs reviewed and stable  Post vital signs:  Reviewed and stable  Last Vitals:  Vitals:   04/11/17 1252 04/11/17 1403  BP: 140/67 (!) 135/45  Pulse: 88 72  Resp: 17 17  Temp: 36.8 C (!) 26.9 C    Complications: No apparent anesthesia complications

## 2017-04-11 NOTE — Op Note (Signed)
[facility] Patient Name: Katherine GitelmanBarbara Flynn Procedure Date: 04/11/2017 MRN: [id number] Attending MD: Napoleon FormKavitha V. Megha Agnes , MD Date of Birth: 1923/05/01 CSN: [id number] Age: 83 Admit Type: Outpatient Procedure:                Upper GI endoscopy Indications:              Active gastrointestinal bleeding Providers:                Napoleon FormKavitha V. Brittain Smithey, MD, Margo AyeValeria McKoy, Technician,                            Norman ClayLisa Nunn, RN Referring MD:              Medicines:                Monitored Anesthesia Care Complications:            No immediate complications. Estimated Blood Loss:     Estimated blood loss: minimal. Procedure:                Pre-Anesthesia Assessment:                           - Prior to the procedure, a History and Physical                            was performed, and patient medications and                            allergies were reviewed. The patient's tolerance of                            previous anesthesia was also reviewed. The risks                            and benefits of the procedure and the sedation                            options and risks were discussed with the patient.                            All questions were answered, and informed consent                            was obtained. Prior Anticoagulants: The patient has                            taken no previous anticoagulant or antiplatelet                            agents. ASA Grade Assessment: IV - A patient with                            severe systemic disease that is a constant threat  to life. After reviewing the risks and benefits,                            the patient was deemed in satisfactory condition to                            undergo the procedure.                           After obtaining informed consent, the endoscope was                            passed under direct vision. Throughout the                            procedure, the  patient's blood pressure, pulse, and                            oxygen saturations were monitored continuously. The                            [id number]-[id number] ([id number]) scope was introduced through the                            mouth, and advanced to the efferent jejunal loop.                            The upper GI endoscopy was accomplished without                            difficulty. The patient tolerated the procedure                            well. Scope In: Scope Out: Findings:      Food was found in the entire esophagus.      Evidence of a Billroth II gastrojejunostomy was found. The gastrojejunal       anastomosis was characterized by ulceration. This was traversed. The       efferent limb was examined. The afferent limb was not examined as it       could not be found.      Cratered gastrojejunal anastomosis ulcer with adherent clot and       underlying vissible vessel was found at the anastomosis. The lesion was       20 mm in largest dimension. Area was successfully injected with 3 mL of       a 1:10,000 solution of epinephrine for hemostasis. Coagulation for       hemostasis using bipolar probe was successful.      The examined jejunum was normal. Impression:               - Food in the esophagus.                           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                           - Normal examined jejunum.                           - Non-bleeding gastric ulcer with a visible vessel.                            Injected. Treated with bipolar cautery.                           - No specimens collected. Moderate Sedation:      N/A- Per Anesthesia Care Recommendation:           - Clear liquid diet.                           - If no evidence of further bleeding ok to advance                            to soft diet as tolerated tomorrow                           - Continue present medications.                            - Continue protonix gtt for 72 hours                           - Monitor Hgb q12h and transfuse as needed Procedure Code(s):        --- Professional ---                           [phone], Esophagogastroduodenoscopy, flexible,                            transoral; with control of bleeding, any method Diagnosis Code(s):        --- Professional ---                           J81.191YT18.128A, Food in esophagus causing other injury,                            initial encounter                           Z98.0, Intestinal bypass and anastomosis status                           K25.4, Chronic or unspecified gastric ulcer with                            hemorrhage                           K92.2, Gastrointestinal hemorrhage, unspecified CPT copyright 2016 American Medical Association.  All rights reserved. The codes documented in this report are preliminary and upon coder review may  be revised to meet current compliance requirements. Napoleon Form, MD 04/11/2017 4:03:46 PM This report has been signed electronically. Number of Addenda: 0

## 2017-04-11 NOTE — Progress Notes (Signed)
PT Cancellation Note  Patient Details Name: Katherine ShoutsBarbara A Flynn MRN: [id number] DOB: Aug 06, 1923   Cancelled Treatment:    Reason Eval/Treat Not Completed: Patient at procedure or test/unavailable-Pt going for EGD. Will check back another day.    Rebeca AlertJannie Tajuan Dufault, MPT Pager: [phone]

## 2017-04-11 NOTE — Anesthesia Postprocedure Evaluation (Signed)
Anesthesia Post Note  Patient: Katherine Flynn  Procedure(s) Performed: Procedure(s) (LRB): ESOPHAGOGASTRODUODENOSCOPY (EGD) WITH PROPOFOL (N/A)     Patient location during evaluation: PACU Anesthesia Type: MAC Level of consciousness: awake and alert Pain management: pain level controlled Vital Signs Assessment: post-procedure vital signs reviewed and stable Respiratory status: spontaneous breathing, nonlabored ventilation, respiratory function stable and patient connected to nasal cannula oxygen Cardiovascular status: stable and blood pressure returned to baseline Anesthetic complications: no    Last Vitals:  Vitals:   04/11/17 1554 04/11/17 1600  BP: (!) 136/42 139/61  Pulse: 87 80  Resp: 17 15  Temp: 37.4 C     Last Pain:  Vitals:   04/11/17 1554  TempSrc: Oral  PainSc: Asleep                 [name]

## 2017-04-11 NOTE — Progress Notes (Addendum)
Pt had an episode of bloody emesis w/ 3 large clots at around 0330. Rapid response was called as pt was lethargic w/ cold extremities and low b/p. On call 'K. Schorr' was notified of pts condition and came to the bedside. Order was given for a bolus, restart Protonix gtt and stat CBC. Pt b/p and vitals now stable. Pt more alert and orientated (at baseline) and is now resting comfortably. Family was notified of pts status. Will continue to monitor. Katherine Flynn

## 2017-04-11 NOTE — Progress Notes (Signed)
Pt had 1 spoonful of applesauce with medications at 1231 per pt's floor RN and notified Dr. Desmond Lopeurk.

## 2017-04-11 NOTE — Anesthesia Procedure Notes (Signed)
Procedure Name: MAC Date/Time: 04/11/2017 3:25 PM Performed by: Talbot Grumbling Pre-anesthesia Checklist: Patient identified, Emergency Drugs available, Suction available and Patient being monitored Patient Re-evaluated:Patient Re-evaluated prior to induction Oxygen Delivery Method: Nasal cannula

## 2017-04-11 NOTE — Progress Notes (Signed)
Shift event note:  Notified at approx 0400 regarding pt having large amount of bright red bloody emesis w/ clots (estimated to be approx 150-175cc) as well as a large dark black BM. RR RN was paged and responded to bedside. Upon my notification pt noted to be somewhat lethargic but responsive. CBC ordered and pt assessed at bedside. When assessed at bedside pt noted somewhat pale, skin cool and dry and she is now noted awake and responsive. She is able to answer simple questions and follow commands. The bloody emesis has resolved. Pt denies pain. Abd soft and nt w/ +bs. BBS diminished but otherwise CTA. VS remained stable > BP 107/52, HR-70's (SR), RR-20 w/ 02 sats of 100% on 2L [place]. Pt lost IV access. Initial difficulty re-establishing PIV. Assisted IV team in obtaining 2 PIV ((R) hand and (L) dorsal foot). RN notified son of pt's change in status.  Assessment/Plan: 1. GIB: (Now appears both upper and lower GIB). Restarted protonix qtt. Made NPO for now. Hb down 1.5 gm's in 12 hrs. CBC and Bmet scheduled to be repeated at 10 am. 2. Leukocytosis: Unclear etiology. Possible stress demargination w/ vomiting, however will add u/a and cxr. No fever. Will continue to monitor closely on telemetry w/ low threshold to tx to SDU if further deterioration.   Leanne ChangKatherine P. Akiba Melfi, NP-C [facility] Pager [phone]  CRITICAL CARE Performed by: Leanne ChangSCHORR, Danyel Tobey P   Total critical care time: 60 minutes  Critical care time was exclusive of separately billable procedures and treating other patients.  Critical care was necessary to treat or prevent imminent or life-threatening deterioration.  Critical care was time spent personally by me on the following activities: development of treatment plan with patient and/or surrogate as well as nursing, discussions with consultants, evaluation of patient's response to treatment, examination of patient, obtaining history from patient or surrogate, ordering and performing  treatments and interventions, ordering and review of laboratory studies, ordering and review of radiographic studies, pulse oximetry and re-evaluation of patient's condition.

## 2017-04-11 NOTE — Progress Notes (Signed)
Progress Note   Subjective  Chief Complaint: Hematemesis  We were initially consult on this patient on 04/06/17 for melena and acute blood loss anemia. She underwent EGD on 04/07/17 with findings of a benign-appearing esophageal stenosis, gastrojejunostomy characterized by ulceration and one nonbleeding jejunal ulcer with adherent clot. There is no active bleeding seen and hemoglobin had stabilized overnight, therefore there was no intervention ulcer. We then signed off on 04/09/17 and patient continued to improve.  Today, 04/11/17 we are called to reconsult the patient in regards to an episode of hematemesis which started last night. In fact per nursing this woke her from her sleep at around 0400. This is noted to be a large amount of red bloody emesis with clots estimated to be approximately 150-175 mL as well as a large dark black bowel movement. The patient was somewhat lethargic but responsive. CBC was ordered and bloody emesis did resolve. Per tonic strip was restarted and it was noted that hemoglobin had dropped 1.5 g in 12 hours.  Today, at time of interview patient is oriented to person and place but unable to tell me anything about her recent episode of vomiting. She tells me that "I feel ok". She denies abdominal pain or other discomfort.    Objective   Vital signs in last 24 hours: Temp:  [97.8 F (36.6 C)-97.9 F (36.6 C)] 97.8 F (36.6 C) (08/01 1041) Pulse Rate:  [67-93] 93 (08/01 1041) Resp:  [16-24] 18 (08/01 1041) BP: (106-154)/(43-86) 133/50 (08/01 1041) SpO2:  [92 %-100 %] 98 % (08/01 1041) FiO2 (%):  [2 %] 2 % (08/01 0556) Weight:  [99 lb 1.6 oz (45 kg)] 99 lb 1.6 oz (45 kg) (08/01 0556) Last BM Date: 04/07/17 General:    Elderly Caucasian female in NAD Heart:  Regular rate and rhythm; no murmurs Lungs: Respirations even and unlabored, lungs CTA bilaterally Abdomen:  Soft, nontender and nondistended. Normal bowel sounds. Extremities:  Without edema. Neurologic:   Alert and oriented,  grossly normal neurologically. Psych:  Cooperative. Normal mood and affect.  Intake/Output from previous day: 07/31 0701 - 08/01 0700 In: 740 [P.O.:597; I.V.:43; IV Piggyback:100] Out: 9 [Urine:8; Stool:1] Intake/Output this shift: Total I/O In: 0  Out: 2 [Urine:2]  Lab Results:  Recent Labs  04/10/17 1603 04/11/17 0421 04/11/17 1020  WBC 6.3 17.3* 6.7  HGB 9.6* 8.1* 7.5*  HCT 30.4* 25.3* 23.3*  PLT 433* 403* 383   BMET  Recent Labs  04/09/17 0428 04/09/17 1231 04/10/17 1050  NA 139 135 137  K 2.7* 3.2* 4.1  CL 106 104 104  CO2 23 22 25   GLUCOSE 74 127* 86  BUN 7 10 20   CREATININE 0.58 0.60 0.56  CALCIUM 8.2* 8.1* 8.5*   Studies/Results: Dg Chest Port 1 View  Result Date: 04/11/2017 CLINICAL DATA:  Bloody emesis. EXAM: PORTABLE CHEST 1 VIEW COMPARISON:  10/11/2016. FINDINGS: Mediastinum and hilar structures normal. Heart size normal. Minimal atelectasis/infiltrate previously identified in the lingula has partially cleared. COPD. No prominent pleural effusion or pneumothorax. IMPRESSION: Minimal atelectasis/ infiltrate previously identified the lingula has partially cleared. COPD . No other focal abnormalities identified. Electronically Signed   By: Maisie Fushomas  Register   On: 04/11/2017 07:02    Assessment / Plan:   Assessment: 1. Jejunal ulcer: Initially seen via EGD on 04/06/17, no bleeding at that time, no intervention, patient was stable for the past 3 days, but started with bleeding again last night, with an episode of hematemesis which woke her  from her sleep 2. Acute blood loss anemia: As above  Plan: 1. Patient will remain nothing by mouth, she is scheduled for an EGD this afternoon at 3:30 with Dr. Lavon PaganiniNandigam. This will be in an attempt to stop the bleeding. 2. Continue supportive measures 3. Please await any further recommendations from Dr. Lavon PaganiniNandigam after time of EGD today.  Thank you for your kind consultation, we will continue to  follow.   LOS: 5 days   Unk LightningJennifer Lynne Lemmon  04/11/2017, 10:58 AM  Pager # [phone]   Attending physician's note   I have taken an interval history, reviewed the chart and examined the patient. I agree with the Advanced Practitioner's note, impression and recommendations.  Will proceed with EGD to evaluate The risks and benefits as well as alternatives of endoscopic procedure(s) have been discussed and reviewed. All questions answered. The patient agrees to proceed.  Iona BeardK Veena Rani Sisney, MD [phone] Mon-Fri 8a-5p [phone] after 5p, weekends, holidays

## 2017-04-11 NOTE — Progress Notes (Signed)
Pt gone for test. Will check on pt next day. [name], [place] [phone]

## 2017-04-11 NOTE — Significant Event (Signed)
Rapid Response Event Note  Overview: Time Called: 0337 Arrival Time: 0340 Event Type: Other (Comment) (GI bleed)  Notified by bedside RN in regards to patient hemoptysis  With clots. Upon arrival, patient was drowsy and pale. Blood clots (5-6) were obtained in specimen container.  Initial Focused Assessment:  Neuro: Patient lethargic, drowsy, pupils (2-3) sluggish but reactive to light. Oriented x 3 but not to situation. Able to follow commands-squeeze hands/wiggle toes. Movements were purposeful but weak.  Cardiac: NSR, HR 70s, BP stable but trending down from previous readings (see vitals), S1 and S2 sounds heard upon auscultation. Pulmonary: Breath sounds clear in all lung fields bilaterally, Respirations 22-24, unlabored, SPO2 93-100%, placed on 2 Liters [place]. GN:FAOZHGI:bowel sounds hypoactive upon auscultation, no tenderness or distension present Interventions: Notified Tama GanderKatherine Schorr NP  Obtaining new IV access Placed Patient on 2 Liters [place]  Give 500 cc bolus Obtain CBC Contacting family   Plan of Care (if not transferred): Patient's Vital Signs Stable will continue monitor patient on telemetry floor per Tama GanderKatherine Schorr NP.  Event Summary:   at   Paged Tama GanderKatherine Schorr NP at [phone]     at          [facility],[place] [facility]

## 2017-04-11 NOTE — Progress Notes (Signed)
PROGRESS NOTE    Katherine Flynn  ZOX:[id number] DOB: 11-Sep-1923 DOA: 04/06/2017 PCP: Gordy SaversKwiatkowski, Peter F, MD  Brief Narrative: Katherine Flynn  is a 83 y.o. female, w hx of Hypothyroidism, mild Dementia, Gerd, Esophagitis, pyloric stenosis,  h/o nissen fundoplication, diverticulosis , EGD 2008=> pyloric stenosis, and esophagitis. Admitted with melena, acute blood loss anemia, Hb of 6.8, underwent EGD, noted to have a gastrojejunal anastomotic ulcer likely NSAID related, stable after transfusion  Assessment & Plan:     Upper Gi bleed -due to Gastro-jejunal anastomosis ulcer, likely NSAID related -h/o intermittent NSAID use - s/p Blood, EGD and PPI, -event last night notice. Patient vomited blood.  GI informed of patient changes in conditions.  Hb decreased to 7. Will order one PRBC>  On Protonix gtt.   Encephalopathy; she was lethargic. Now she is alert.  Suspect related to hypotension.   Leukocytosis;  Reactive. Now normal.     Acute Blood loss anemia -transfused 2 units PRBC on 7/27 -bleeding overnight.  -transfuse another unit,   Elevated troponin -due to demand ischemia from anemia -no chest pain, no EKG changes -2-D echo shows EF of 55-60% and no wall motion abnormalities, no plan for further workup at this time  Mild Dementia -stable  Hypokalemia -replaced  Hypothyroidism -synthroid  Protein calorie malnutrition (HCC) supplements as tolerated   COPD -stable, nebs PRN  DVT prophylaxis:SCDs Code Status: DNR Family Communication: None at bedside Disposition Plan: SNF if acceptable   Consultants:   [facility]  Procedures -EGD Impression:               - Benign-appearing esophageal stenosis.                           - A gastrojejunostomy was found, characterized by                            ulceration.                           - One non-bleeding jejunal ulcer with adherent clot.                           - No specimens  collected.  Subjective: Events from last night notice.  Patient vomited bright red blood. Had large black BM,   Objective: Vitals:   04/11/17 0350 04/11/17 0352 04/11/17 0407 04/11/17 0556  BP: (!) 113/48 (!) 106/51 (!) 107/52 (!) 127/43  Pulse: 74 76  76  Resp: (!) 24  (!) 21 (!) 21  Temp:    97.8 F (36.6 C)  TempSrc:    Oral  SpO2: 92% 93% 100% 100%  Weight:    45 kg (99 lb 1.6 oz)  Height:        Intake/Output Summary (Last 24 hours) at 04/11/17 0902 Last data filed at 04/11/17 0644  Gross per 24 hour  Intake              740 ml  Output                8 ml  Net              732 ml   Filed Weights   04/07/17 0245 04/10/17 0647 04/11/17 0556  Weight: 43 kg (94 lb 12.8 oz) 44.7 kg (98 lb 8.7  oz) 45 kg (99 lb 1.6 oz)    Examination:   Gen: NAD HEENT: PERLA.  Lungs: Bilateral crackles. Normal respiratory effort.  CVS: S 1, S 2 RRR Abd: soft, nt, nd Extremities: no cyanosis.  Skin: no new rashes, large healed scab left lower leg   Data Reviewed:   CBC:  Recent Labs Lab 04/08/17 0435 04/09/17 0428 04/10/17 1050 04/10/17 1603 04/11/17 0421  WBC 8.8 7.3 6.4 6.3 17.3*  HGB 9.5* 9.8* 9.8* 9.6* 8.1*  HCT 30.0* 30.8* 30.6* 30.4* 25.3*  MCV 77.7* 77.8* 77.5* 76.6* 78.8  PLT 375 392 386 433* 403*   Basic Metabolic Panel:  Recent Labs Lab 04/07/17 0129 04/08/17 0435 04/09/17 0428 04/09/17 1231 04/10/17 1050  NA 141 138 139 135 137  K 3.6 3.1* 2.7* 3.2* 4.1  CL 112* 107 106 104 104  CO2 20* 23 23 22 25   GLUCOSE 86 78 74 127* 86  BUN 15 9 7 10 20   CREATININE 0.57 0.67 0.58 0.60 0.56  CALCIUM 8.0* 8.3* 8.2* 8.1* 8.5*   GFR: Estimated Creatinine Clearance: 32.5 mL/min (by C-G formula based on SCr of 0.56 mg/dL). Liver Function Tests:  Recent Labs Lab 04/06/17 1019 04/07/17 0129  AST 18 21  ALT 12* 11*  ALKPHOS 55 47  BILITOT 0.3 0.7  PROT 5.9* 5.2*  ALBUMIN 2.8* 2.5*   No results for input(s): LIPASE, AMYLASE in the last 168 hours. No  results for input(s): AMMONIA in the last 168 hours. Coagulation Profile: No results for input(s): INR, PROTIME in the last 168 hours. Cardiac Enzymes:  Recent Labs Lab 04/06/17 1356 04/06/17 2322 04/07/17 0129  TROPONINI 0.04* 0.23* 0.22*   BNP (last 3 results) No results for input(s): PROBNP in the last 8760 hours. HbA1C: No results for input(s): HGBA1C in the last 72 hours. CBG: No results for input(s): GLUCAP in the last 168 hours. Lipid Profile: No results for input(s): CHOL, HDL, LDLCALC, TRIG, CHOLHDL, LDLDIRECT in the last 72 hours. Thyroid Function Tests: No results for input(s): TSH, T4TOTAL, FREET4, T3FREE, THYROIDAB in the last 72 hours. Anemia Panel: No results for input(s): VITAMINB12, FOLATE, FERRITIN, TIBC, IRON, RETICCTPCT in the last 72 hours. Urine analysis:    Component Value Date/Time   COLORURINE YELLOW 06/05/2016 1600   APPEARANCEUR CLOUDY (A) 06/05/2016 1600   LABSPEC 1.026 06/05/2016 1600   PHURINE 5.5 06/05/2016 1600   GLUCOSEU NEGATIVE 06/05/2016 1600   HGBUR SMALL (A) 06/05/2016 1600   BILIRUBINUR NEGATIVE 06/05/2016 1600   KETONESUR 15 (A) 06/05/2016 1600   PROTEINUR NEGATIVE 06/05/2016 1600   UROBILINOGEN 0.2 04/20/2015 1452   NITRITE POSITIVE (A) 06/05/2016 1600   LEUKOCYTESUR SMALL (A) 06/05/2016 1600   Sepsis Labs: @LABRCNTIP (procalcitonin:4,lacticidven:4)  ) Recent Results (from the past 240 hour(s))  MRSA PCR Screening     Status: Abnormal   Collection Time: 04/06/17 12:30 PM  Result Value Ref Range Status   MRSA by PCR POSITIVE (A) NEGATIVE Final    Comment:        The GeneXpert MRSA Assay (FDA approved for NASAL specimens only), is one component of a comprehensive MRSA colonization surveillance program. It is not intended to diagnose MRSA infection nor to guide or monitor treatment for MRSA infections. RESULT CALLED TO, READ BACK BY AND VERIFIED WITH: A.KOONTZ RN AT 1709 ON 04/06/17 BY S.VANHOORNE MLS           Radiology Studies: Dg Chest Port 1 View  Result Date: 04/11/2017 CLINICAL DATA:  Bloody emesis. EXAM: PORTABLE  CHEST 1 VIEW COMPARISON:  10/11/2016. FINDINGS: Mediastinum and hilar structures normal. Heart size normal. Minimal atelectasis/infiltrate previously identified in the lingula has partially cleared. COPD. No prominent pleural effusion or pneumothorax. IMPRESSION: Minimal atelectasis/ infiltrate previously identified the lingula has partially cleared. COPD . No other focal abnormalities identified. Electronically Signed   By: Maisie Fushomas  Register   On: 04/11/2017 07:02        Scheduled Meds: . Chlorhexidine Gluconate Cloth  6 each Topical Q0600  . feeding supplement (ENSURE ENLIVE)  237 mL Oral BID BM  . levothyroxine  100 mcg Oral QAC breakfast  . metoprolol tartrate  25 mg Oral BID  . multivitamin with minerals  1 tablet Oral Daily  . mupirocin ointment  1 application Nasal BID  . [START ON 04/14/2017] pantoprazole  40 mg Intravenous Q12H   Continuous Infusions: . sodium chloride 10 mL/hr at 04/11/17 0531  . pantoprozole (PROTONIX) infusion 8 mg/hr (04/11/17 0530)     LOS: 5 days    Time spent: 35min    Addalynne Golding, Md.  [facility] Pager [phone]  If 7PM-7AM, please contact night-coverage [email or web address] Password [email or web address] 04/11/2017, 9:02 AM

## 2017-04-12 LAB — BASIC METABOLIC PANEL
ANION GAP: 6 (ref 5–15)
BUN: 18 mg/dL (ref 6–20)
CHLORIDE: 110 mmol/L (ref 101–111)
CO2: 25 mmol/L (ref 22–32)
Calcium: 8 mg/dL — ABNORMAL LOW (ref 8.9–10.3)
Creatinine, Ser: 0.62 mg/dL (ref 0.44–1.00)
GFR calc non Af Amer: >60 mL/min (ref >60)
Glucose, Bld: 83 mg/dL (ref 65–99)
POTASSIUM: 3.2 mmol/L — AB (ref 3.5–5.1)
SODIUM: 141 mmol/L (ref 135–145)

## 2017-04-12 LAB — CBC
HEMATOCRIT: 23.1 % — AB (ref 36.0–46.0)
HEMOGLOBIN: 7.6 g/dL — AB (ref 12.0–15.0)
MCH: 25.6 pg — ABNORMAL LOW (ref 26.0–34.0)
MCHC: 32.9 g/dL (ref 30.0–36.0)
MCV: 77.8 fL — ABNORMAL LOW (ref 78.0–100.0)
Platelets: 309 10*3/uL (ref 150–400)
RBC: 2.97 MIL/uL — AB (ref 3.87–5.11)
RDW: 17.8 % — ABNORMAL HIGH (ref 11.5–15.5)
WBC: 5.8 10*3/uL (ref 4.0–10.5)

## 2017-04-12 LAB — PREPARE RBC (CROSSMATCH)

## 2017-04-12 MED ORDER — POTASSIUM CHLORIDE 20 MEQ/15ML (10%) PO SOLN
40.0000 meq | Freq: Once | ORAL | Status: AC
  Administered 2017-04-12: 40 meq via ORAL
  Filled 2017-04-12: qty 30

## 2017-04-12 MED ORDER — SUCRALFATE 1 GM/10ML PO SUSP
1.0000 g | Freq: Three times a day (TID) | ORAL | Status: DC
  Administered 2017-04-12 – 2017-04-14 (×8): 1 g via ORAL
  Filled 2017-04-12 (×8): qty 10

## 2017-04-12 MED ORDER — SODIUM CHLORIDE 0.9 % IV SOLN
Freq: Once | INTRAVENOUS | Status: AC
  Administered 2017-04-12: 11:00:00 via INTRAVENOUS

## 2017-04-12 NOTE — Progress Notes (Signed)
Progress Note   Subjective  Chief Complaint: Hematemesis, gastrojejunal anastomotic ulcer  Patient has had no further episodes of bleeding, hemoglobin stable overnight. She tells me she feels generally unwell this morning as she did not sleep well.     Objective   Vital signs in last 24 hours: Temp:  [97.5 F (36.4 C)-99.3 F (37.4 C)] 98.1 F (36.7 C) (08/02 0617) Pulse Rate:  [72-94] 72 (08/02 0617) Resp:  [13-20] 16 (08/02 0617) BP: (112-148)/(42-99) 141/67 (08/02 0617) SpO2:  [92 %-100 %] 100 % (08/02 0617) Weight:  [99 lb 10.4 oz (45.2 kg)] 99 lb 10.4 oz (45.2 kg) (08/02 0617) Last BM Date: 04/12/17 General:  Elderly Caucasian female in NAD Heart:  Regular rate and rhythm; no murmurs Lungs: Respirations even and unlabored, lungs CTA bilaterally Abdomen:  Soft, nontender and nondistended. Normal bowel sounds. Extremities:  Without edema. Neurologic:  Alert and oriented,  grossly normal neurologically. Psych:  Cooperative. Normal mood and affect.  Intake/Output from previous day: 08/01 0701 - 08/02 0700 In: 2077 [I.V.:1647; Blood:430] Out: 203 [Urine:203]  Lab Results:  Recent Labs  04/11/17 0421 04/11/17 1020 04/12/17 0407  WBC 17.3* 6.7 5.8  HGB 8.1* 7.5* 7.6*  HCT 25.3* 23.3* 23.1*  PLT 403* 383 309   BMET  Recent Labs  04/10/17 1050 04/11/17 1020 04/12/17 0407  NA 137 138 141  K 4.1 3.6 3.2*  CL 104 106 110  CO2 25 23 25   GLUCOSE 86 116* 83  BUN 20 22* 18  CREATININE 0.56 0.68 0.62  CALCIUM 8.5* 8.1* 8.0*   Studies/Results: Dg Chest Port 1 View  Result Date: 04/11/2017 CLINICAL DATA:  Bloody emesis. EXAM: PORTABLE CHEST 1 VIEW COMPARISON:  10/11/2016. FINDINGS: Mediastinum and hilar structures normal. Heart size normal. Minimal atelectasis/infiltrate previously identified in the lingula has partially cleared. COPD. No prominent pleural effusion or pneumothorax. IMPRESSION: Minimal atelectasis/ infiltrate previously identified the lingula has  partially cleared. COPD . No other focal abnormalities identified. Electronically Signed   By: Maisie Fushomas  Register   On: 04/11/2017 07:02   EGD 04/11/17, Dr. Lavon PaganiniNandigam: Findings:      Food was found in the entire esophagus.      Evidence of a Billroth II gastrojejunostomy was found. The gastrojejunal       anastomosis was characterized by ulceration. This was traversed. The       efferent limb was examined. The afferent limb was not examined as it       could not be found.      Cratered gastrojejunal anastomosis ulcer with adherent clot and       underlying vissible vessel was found at the anastomosis. The lesion was       20 mm in largest dimension. Area was successfully injected with 3 mL of       a 1:10,000 solution of epinephrine for hemostasis. Coagulation for       hemostasis using bipolar probe was successful.      The examined jejunum was normal. Impression:               - Food in the esophagus.                           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                           -  Normal examined jejunum.                           - Non-bleeding gastric ulcer with a visible vessel.                            Injected. Treated with bipolar cautery.                           - No specimens collected Recommendation:           - Clear liquid diet.                           - If no evidence of further bleeding ok to advance                            to soft diet as tolerated tomorrow                           - Continue present medications.                           - Continue protonix gtt for 72 hours                           - Monitor Hgb q12h and transfuse as needed   Assessment / Plan:   Assessment: 1. Jejunal ulcer: Initially seen on EGD 04/06/17, no bleeding at that time and no intervention, patient then started rebleeding on 04/11/17 with hematemesis, EGD repeated as above on 04/11/17 and ulcer was injected with  epinephrine, no further signs of bleeding overnight 2. Acute blood loss anemia: As above  Plan: 1. May advance diet to soft today 2. Continue to monitor hemoglobin, if any further signs of bleeding please let us know 3. Continue Protonix drip for a total of 72 hours, this may be DC'd 04/14/17 4. Please await any further recommendations Dr. Lavon PaganiniNandigam later today  Thank you for your kind consultation, we will continue to follow.   LOS: 6 days   Unk LightningJennifer Lynne Flynn  04/12/2017, 10:33 AM  Pager # [phone]   Attending physician's note   I have taken an interval history, reviewed the chart and examined the patient. I agree with the Advanced Practitioner's note, impression and recommendations.  Status post EGD yesterday, noted large cratered ulcer with adherent clot and underlying vessel treated with cautery. Continue to monitor hemoglobin daily and transfuse as needed Continue PPI drip for 72 hours Carafate 1 g suspension before meals and at bedtime  K Scherry RanVeena Nandigam, MD [phone] Mon-Fri 8a-5p [phone] after 5p, weekends, holidays

## 2017-04-12 NOTE — Progress Notes (Signed)
Pt has chosen [facility] for SNF at DC. CSW [facility] from [facility]. Facility will now to hear back when DC date is known.   Ilean SkillMeghan Neely Cecena, MSW, LCSW Clinical Social Work 04/12/2017 [phone]

## 2017-04-12 NOTE — Progress Notes (Signed)
Physical Therapy Treatment Patient Details Name: Katherine ShoutsBarbara A Flynn MRN: [id number] DOB: [birth year] Today's Date: 04/12/2017    History of Present Illness 83 yo female admitted with GI bleed, anemia. Hx of multiple falls, low back pain, OA, COPD, GERD.     PT Comments    The patient is getting blood. Patient demonstrates being weaker today. Continue PT while in acute care.   Follow Up Recommendations  SNF     Equipment Recommendations  None recommended by PT    Recommendations for Other Services       Precautions / Restrictions Precautions Precautions: Fall    Mobility  Bed Mobility   Bed Mobility: Supine to Sit;Sit to Supine     Supine to sit: Min assist Sit to supine: Min assist   General bed mobility comments: cues for safety  Transfers Overall transfer level: Needs assistance   Transfers: Sit to/from Stand Sit to Stand: Mod assist         General transfer comment: bear hug to stand from bed and side step along the bed. Patient getting blood so did not ambulate  Ambulation/Gait                 Stairs            Wheelchair Mobility    Modified Rankin (Stroke Patients Only)       Balance           Standing balance support: Bilateral upper extremity supported;During functional activity Standing balance-Leahy Scale: Poor                              Cognition Arousal/Alertness: Lethargic                                            Exercises      General Comments        Pertinent Vitals/Pain Faces Pain Scale: Hurts a little bit Pain Location: back Pain Descriptors / Indicators: Sore;Aching Pain Intervention(s): Monitored during session    Home Living                      Prior Function            PT Goals (current goals can now be found in the care plan section) Progress towards PT goals: Progressing toward goals    Frequency    Min 3X/week      PT Plan Current  plan remains appropriate    Co-evaluation              AM-PAC PT "6 Clicks" Daily Activity  Outcome Measure  Difficulty turning over in bed (including adjusting bedclothes, sheets and blankets)?: A Little Difficulty moving from lying on back to sitting on the side of the bed? : A Little Difficulty sitting down on and standing up from a chair with arms (e.g., wheelchair, bedside commode, etc,.)?: Total Help needed moving to and from a bed to chair (including a wheelchair)?: Total Help needed walking in hospital room?: Total Help needed climbing 3-5 steps with a railing? : Total 6 Click Score: 10    End of Session   Activity Tolerance: Patient limited by fatigue Patient left: in bed;with call bell/phone within reach;with bed alarm set Nurse Communication: Mobility status PT Visit Diagnosis: Muscle weakness (generalized) (M62.81);Difficulty in walking,  not elsewhere classified (R26.2)     Time: 7829-56211501-1515 PT Time Calculation (min) (ACUTE ONLY): 14 min  Charges:  $Therapeutic Activity: 8-22 mins                    G CodesBlanchard Kelch:       Amira Podolak PT [phone]    [name], Carrington Mullenax [name] 04/12/2017, 3:24 PM

## 2017-04-12 NOTE — Progress Notes (Signed)
PROGRESS NOTE    Katherine Flynn  GNF:[id number] DOB: 02/01/1923 DOA: 04/06/2017 PCP: Gordy Savers, MD  Brief Narrative: Katherine Flynn  is a 83 y.o. female, w hx of Hypothyroidism, mild Dementia, Gerd, Esophagitis, pyloric stenosis,  h/o nissen fundoplication, diverticulosis , EGD 2008=> pyloric stenosis, and esophagitis. Admitted with melena, acute blood loss anemia, Hb of 6.8, underwent EGD, noted to have a gastrojejunal anastomotic ulcer likely NSAID related, stable after transfusion  Assessment & Plan:   Upper Gi bleed -due to Gastro-jejunal anastomosis ulcer, likely NSAID related -h/o intermittent NSAID use - s/p Blood, EGD and PPI, -patient vomited blood the night of  7-31. 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 Normal examined jejunum. Non-bleeding gastric ulcer with a visible vessel.  Injected. Treated with bipolar cautery PRBC transfusion 8-01. Hb still at 7. Plan to give another PRBC 8-02 On Protonix gtt for 72 hours.  Advance diet today to soft.   Encephalopathy; she was lethargic after episode of vomiting. Resolved.  Suspect related to hypotension.   Leukocytosis;  Reactive. Now normal.     Acute Blood loss anemia -transfused 2 units PRBC on 7/27 -bleeding overnight.  -transfuse another unit, 8-01.  -Plan t transfuse PRBC 8-02  Elevated troponin -due to demand ischemia from anemia -no chest pain, no EKG changes -2-D echo shows EF of 55-60% and no wall motion abnormalities, no plan for further workup at this time  Mild Dementia -stable  Hypokalemia -replaced  Hypothyroidism -synthroid  Hypokalemia; replete with 40 meq times one.   Protein calorie malnutrition (HCC) supplements as tolerated   COPD -stable, nebs PRN  DVT prophylaxis:SCDs Code Status: DNR Family Communication: None at bedside Disposition Plan: SNF if acceptable   Consultants:   [facility]  GI  Procedures -EGD Impression:               - Benign-appearing esophageal stenosis.                           - A gastrojejunostomy was found, characterized by                            ulceration.                           - One non-bleeding jejunal ulcer with adherent clot.                           - No specimens collected.  Subjective: No further vomiting. She is feeling weak and tired.    Objective: Vitals:   04/12/17 0617 04/12/17 1314 04/12/17 1330 04/12/17 1400  BP: (!) 141/67 (!) 127/52 121/68 (!) 142/56  Pulse: 72 73 77 69  Resp: 16   18  Temp: 98.1 F (36.7 C) 98 F (36.7 C) 98.2 F (36.8 C) 97.9 F (36.6 C)  TempSrc: Axillary Oral Axillary Oral  SpO2: 100% 100% 99% 98%  Weight: 45.2 kg (99 lb 10.4 oz)     Height:        Intake/Output Summary (Last 24 hours) at 04/12/17 1640 Last data filed at 04/12/17 1620  Gross per 24 hour  Intake           1957.5 ml  Output  0 ml  Net           1957.5 ml   Filed Weights   04/10/17 0647 04/11/17 0556 04/12/17 0617  Weight: 44.7 kg (98 lb 8.7 oz) 45 kg (99 lb 1.6 oz) 45.2 kg (99 lb 10.4 oz)    Examination:   Gen: NAD HEENT: PERLA Lungs: Normal respiratory Effort. No wheezing  CVS: S 1, S 2 RRR Abd: Soft, Nt, ND Extremities: no cyanosis.  Skin: no new rashes, large healed scab left lower leg   Data Reviewed:   CBC:  Recent Labs Lab 04/10/17 1050 04/10/17 1603 04/11/17 0421 04/11/17 1020 04/12/17 0407  WBC 6.4 6.3 17.3* 6.7 5.8  HGB 9.8* 9.6* 8.1* 7.5* 7.6*  HCT 30.6* 30.4* 25.3* 23.3* 23.1*  MCV 77.5* 76.6* 78.8 78.2 77.8*  PLT 386 433* 403* 383 309   Basic Metabolic Panel:  Recent Labs Lab 04/09/17 0428 04/09/17 1231 04/10/17 1050 04/11/17 1020 04/12/17 0407  NA 139 135 137 138 141  K 2.7* 3.2* 4.1 3.6 3.2*  CL 106 104 104 106 110  CO2 23 22 25 23 25   GLUCOSE 74 127* 86 116* 83  BUN 7 10 20  22* 18  CREATININE 0.58 0.60 0.56 0.68 0.62  CALCIUM 8.2* 8.1* 8.5* 8.1* 8.0*    GFR: Estimated Creatinine Clearance: 32.7 mL/min (by C-G formula based on SCr of 0.62 mg/dL). Liver Function Tests:  Recent Labs Lab 04/06/17 1019 04/07/17 0129  AST 18 21  ALT 12* 11*  ALKPHOS 55 47  BILITOT 0.3 0.7  PROT 5.9* 5.2*  ALBUMIN 2.8* 2.5*   No results for input(s): LIPASE, AMYLASE in the last 168 hours. No results for input(s): AMMONIA in the last 168 hours. Coagulation Profile: No results for input(s): INR, PROTIME in the last 168 hours. Cardiac Enzymes:  Recent Labs Lab 04/06/17 1356 04/06/17 2322 04/07/17 0129  TROPONINI 0.04* 0.23* 0.22*   BNP (last 3 results) No results for input(s): PROBNP in the last 8760 hours. HbA1C: No results for input(s): HGBA1C in the last 72 hours. CBG: No results for input(s): GLUCAP in the last 168 hours. Lipid Profile: No results for input(s): CHOL, HDL, LDLCALC, TRIG, CHOLHDL, LDLDIRECT in the last 72 hours. Thyroid Function Tests: No results for input(s): TSH, T4TOTAL, FREET4, T3FREE, THYROIDAB in the last 72 hours. Anemia Panel: No results for input(s): VITAMINB12, FOLATE, FERRITIN, TIBC, IRON, RETICCTPCT in the last 72 hours. Urine analysis:    Component Value Date/Time   COLORURINE YELLOW 06/05/2016 1600   APPEARANCEUR CLOUDY (A) 06/05/2016 1600   LABSPEC 1.026 06/05/2016 1600   PHURINE 5.5 06/05/2016 1600   GLUCOSEU NEGATIVE 06/05/2016 1600   HGBUR SMALL (A) 06/05/2016 1600   BILIRUBINUR NEGATIVE 06/05/2016 1600   KETONESUR 15 (A) 06/05/2016 1600   PROTEINUR NEGATIVE 06/05/2016 1600   UROBILINOGEN 0.2 04/20/2015 1452   NITRITE POSITIVE (A) 06/05/2016 1600   LEUKOCYTESUR SMALL (A) 06/05/2016 1600   Sepsis Labs: @LABRCNTIP (procalcitonin:4,lacticidven:4)  ) Recent Results (from the past 240 hour(s))  MRSA PCR Screening     Status: Abnormal   Collection Time: 04/06/17 12:30 PM  Result Value Ref Range Status   MRSA by PCR POSITIVE (A) NEGATIVE Final    Comment:        The GeneXpert MRSA Assay  (FDA approved for NASAL specimens only), is one component of a comprehensive MRSA colonization surveillance program. It is not intended to diagnose MRSA infection nor to guide or monitor treatment for MRSA infections. RESULT CALLED TO, READ BACK  BY AND VERIFIED WITH: A.KOONTZ RN AT 1709 ON 04/06/17 BY S.VANHOORNE MLS          Radiology Studies: Dg Chest Port 1 View  Result Date: 04/11/2017 CLINICAL DATA:  Bloody emesis. EXAM: PORTABLE CHEST 1 VIEW COMPARISON:  10/11/2016. FINDINGS: Mediastinum and hilar structures normal. Heart size normal. Minimal atelectasis/infiltrate previously identified in the lingula has partially cleared. COPD. No prominent pleural effusion or pneumothorax. IMPRESSION: Minimal atelectasis/ infiltrate previously identified the lingula has partially cleared. COPD . No other focal abnormalities identified. Electronically Signed   By: Maisie Fushomas  Register   On: 04/11/2017 07:02        Scheduled Meds: . feeding supplement (ENSURE ENLIVE)  237 mL Oral BID BM  . levothyroxine  100 mcg Oral QAC breakfast  . metoprolol tartrate  25 mg Oral BID  . multivitamin with minerals  1 tablet Oral Daily  . [START ON 04/14/2017] pantoprazole  40 mg Intravenous Q12H   Continuous Infusions: . sodium chloride 50 mL/hr at 04/12/17 1628  . pantoprozole (PROTONIX) infusion 8 mg/hr (04/12/17 1628)     LOS: 6 days    Time spent: 35min    Marybella Ethier, Md.  [facility] Pager [phone]  If 7PM-7AM, please contact night-coverage [email or web address] Password [email or web address] 04/12/2017, 4:40 PM

## 2017-04-12 NOTE — Progress Notes (Signed)
Occupational Therapy Treatment Patient Details Name: Katherine ShoutsBarbara A Coco MRN: [id number] DOB: [birth year] Today's Date: 04/12/2017    History of present illness 83 yo female admitted with GI bleed, anemia. Hx of multiple falls, low back pain, OA, COPD, GERD.    OT comments  Pt very thankful to get to chair and order soft food for lunch  Follow Up Recommendations  SNF;Supervision/Assistance - 24 hour          Precautions / Restrictions Precautions Precautions: Fall Restrictions Weight Bearing Restrictions: No       Mobility Bed Mobility Overal bed mobility: Needs Assistance Bed Mobility: Supine to Sit              Transfers Overall transfer level: Needs assistance Equipment used: 1 person hand held assist Transfers: Sit to/from Stand;Stand Pivot Transfers Sit to Stand: Mod assist;Max assist Stand pivot transfers: Mod assist;Max assist            Balance Overall balance assessment: Needs assistance;History of Falls         Standing balance support: Bilateral upper extremity supported Standing balance-Leahy Scale: Poor Standing balance comment: requires RW                           ADL either performed or assessed with clinical judgement   ADL   Eating/Feeding: Set up;Sitting   Grooming: Minimal assistance;Sitting;Cueing for safety;Cueing for sequencing       Lower Body Bathing: Maximal assistance;Sit to/from stand;Cueing for sequencing;Cueing for safety           Toilet Transfer: Maximal assistance;BSC;Cueing for safety;Stand-pivot   Toileting- Clothing Manipulation and Hygiene: Maximal assistance;Sit to/from stand;Cueing for sequencing;Cueing for safety         General ADL Comments: pt needed increased A this day. Pt does have low hemoglobin. RN oked pt getting to chair     Vision Patient Visual Report: No change from baseline            Cognition Arousal/Alertness: Awake/alert Behavior During Therapy: WFL for tasks  assessed/performed (pt anxious to go home. OT explained reasons pt cant go home) Overall Cognitive Status: Within Functional Limits for tasks assessed                                                General Comments      Pertinent Vitals/ Pain       Pain Assessment: Faces Faces Pain Scale: Hurts little more Pain Location: back Pain Descriptors / Indicators: Sore;Aching Pain Intervention(s): Monitored during session;Repositioned         Frequency  Min 2X/week        Progress Toward Goals  OT Goals(current goals can now be found in the care plan section)  Progress towards OT goals: Progressing toward goals     Plan Discharge plan remains appropriate    Co-evaluation                 AM-PAC PT "6 Clicks" Daily Activity     Outcome Measure   Help from another person eating meals?: A Little Help from another person taking care of personal grooming?: A Little Help from another person toileting, which includes using toliet, bedpan, or urinal?: A Lot Help from another person bathing (including washing, rinsing, drying)?: A Lot Help from another person to put on and  taking off regular upper body clothing?: A Little Help from another person to put on and taking off regular lower body clothing?: A Lot 6 Click Score: 15    End of Session    OT Visit Diagnosis: Unsteadiness on feet (R26.81);Muscle weakness (generalized) (M62.81);Repeated falls (R29.6)   Activity Tolerance Patient tolerated treatment well   Patient Left with call bell/phone within reach;in chair;with chair alarm set   Nurse Communication Mobility status        Time: 7829-56211019-1053 OT Time Calculation (min): 34 min  Charges: OT General Charges $OT Visit: 1 Procedure OT Treatments $Self Care/Home Management : 23-37 mins  Box CanyonLori Calvyn Kurtzman, [place]OT [phone]   Einar CrowEDDING, Inri Sobieski D 04/12/2017, 11:05 AM

## 2017-04-13 ENCOUNTER — Encounter (HOSPITAL_COMMUNITY): Payer: Self-pay | Admitting: Gastroenterology

## 2017-04-13 LAB — TYPE AND SCREEN
ABO/RH(D): O NEG
Antibody Screen: NEGATIVE
UNIT DIVISION: 0
UNIT DIVISION: 0

## 2017-04-13 LAB — BASIC METABOLIC PANEL
Anion gap: 7 (ref 5–15)
BUN: 10 mg/dL (ref 6–20)
CALCIUM: 8.3 mg/dL — AB (ref 8.9–10.3)
CO2: 24 mmol/L (ref 22–32)
Chloride: 110 mmol/L (ref 101–111)
Creatinine, Ser: 0.6 mg/dL (ref 0.44–1.00)
GFR calc Af Amer: >60 mL/min (ref >60)
GLUCOSE: 83 mg/dL (ref 65–99)
Potassium: 3.4 mmol/L — ABNORMAL LOW (ref 3.5–5.1)
SODIUM: 141 mmol/L (ref 135–145)

## 2017-04-13 LAB — BPAM RBC
BLOOD PRODUCT EXPIRATION DATE: 201808302359
Blood Product Expiration Date: 201808202359
ISSUE DATE / TIME: 201808011821
ISSUE DATE / TIME: 201808021334
UNIT TYPE AND RH: 9500
UNIT TYPE AND RH: 9500

## 2017-04-13 LAB — CBC
HCT: 29.7 % — ABNORMAL LOW (ref 36.0–46.0)
Hemoglobin: 9.6 g/dL — ABNORMAL LOW (ref 12.0–15.0)
MCH: 25.9 pg — AB (ref 26.0–34.0)
MCHC: 32.3 g/dL (ref 30.0–36.0)
MCV: 80.1 fL (ref 78.0–100.0)
Platelets: 308 10*3/uL (ref 150–400)
RBC: 3.71 MIL/uL — ABNORMAL LOW (ref 3.87–5.11)
RDW: 18.5 % — AB (ref 11.5–15.5)
WBC: 5.3 10*3/uL (ref 4.0–10.5)

## 2017-04-13 MED ORDER — POTASSIUM CHLORIDE CRYS ER 20 MEQ PO TBCR
40.0000 meq | EXTENDED_RELEASE_TABLET | Freq: Once | ORAL | Status: AC
Start: 2017-04-13 — End: 2017-04-13
  Administered 2017-04-13: 40 meq via ORAL
  Filled 2017-04-13: qty 2

## 2017-04-13 NOTE — Progress Notes (Signed)
Pt discharging to SNF, CSW following. 

## 2017-04-13 NOTE — Progress Notes (Signed)
    Progress Note   Subjective  Chief Complaint: Hematemesis  Pt just finishing with a bath this morning, feels better than yesterday as she was able to sleep some last night. Denies any new complaints or concerns. No further hematemesis.    Objective   Vital signs in last 24 hours: Temp:  [97.5 F (36.4 C)-98.2 F (36.8 C)] 97.5 F (36.4 C) (08/03 0425) Pulse Rate:  [66-84] 66 (08/03 0425) Resp:  [18-19] 19 (08/03 0425) BP: (121-158)/(52-93) 158/69 (08/03 0425) SpO2:  [98 %-100 %] 99 % (08/03 0425) Weight:  [104 lb 8 oz (47.4 kg)] 104 lb 8 oz (47.4 kg) (08/03 0425) Last BM Date: 04/12/17 General:    Elderly Caucasian female in NAD Heart:  Regular rate and rhythm; no murmurs Lungs: Respirations even and unlabored, lungs CTA bilaterally Abdomen:  Soft, nontender and nondistended. Normal bowel sounds. Extremities:  Without edema. Neurologic:  Alert and oriented,  grossly normal neurologically. Psych:  Cooperative. Normal mood and affect.  Intake/Output from previous day: 08/02 0701 - 08/03 0700 In: 1667.5 [I.V.:1352.5; Blood:315] Out: -  Intake/Output this shift: Total I/O In: -  Out: 850 [Urine:850]  Lab Results:  Recent Labs  04/11/17 1020 04/12/17 0407 04/13/17 0444  WBC 6.7 5.8 5.3  HGB 7.5* 7.6* 9.6*  HCT 23.3* 23.1* 29.7*  PLT 383 309 308   BMET  Recent Labs  04/11/17 1020 04/12/17 0407 04/13/17 0444  NA 138 141 141  K 3.6 3.2* 3.4*  CL 106 110 110  CO2 23 25 24   GLUCOSE 116* 83 83  BUN 22* 18 10  CREATININE 0.68 0.62 0.60  CALCIUM 8.1* 8.0* 8.3*       Assessment / Plan:   Assessment: 1. Anastomotic Ulcer:seen EGD 04/06/17-no bleeding/no intervention; seen EGD 04/11/17-treated with epinephrine injection and bipolar cautery, no further bleeding 2. ABLA: improved, no further bleeding  Plan: 1. Continue soft diet this afternoon, can advance as tolerated if no further signs of bleeding 2. Continue Protonix ggt until 04/14/17 (d/c after 72 hr),  then continue on Pantoprazole Po 40mg  BID 3.  Continue to monitor hgb 4. Added Carafate suspension 1 g QID 5. We will sign off today. Please await any final recommendations from Dr. Lavon PaganiniNandigam  Thank you for your kind consultation   LOS: 7 days   Unk LightningJennifer Lynne Lemmon  04/13/2017, 10:03 AM  Pager # [phone]   Attending physician's note   I have taken an interval history, reviewed the chart and examined the patient. I agree with the Advanced Practitioner's note, impression and recommendations.  Gastro jejunal anastomotic ulcer with adherent clot and visible vessel treated endoscopically with bipolar cautery on 04/11/2017. No evidence of further bleeding. Hemoglobin stable. Continue PPI drip for total of 72 hours and then can transition to high dose PPI twice daily Carafate suspension before meals and at bedtime for 2 months We'll sign off, please call with any questions  K Scherry RanVeena Marianita Botkin, MD [phone] Mon-Fri 8a-5p [phone] after 5p, weekends, holidays

## 2017-04-13 NOTE — Progress Notes (Signed)
Nutrition Follow-up  DOCUMENTATION CODES:   Not applicable  INTERVENTION:   Ensure Enlive po BID, each supplement provides 350 kcal and 20 grams of protein  MVI daily  NUTRITION DIAGNOSIS:   Malnutrition (Severe) related to chronic illness (COPD) as evidenced by severe depletion of body fat, severe depletion of muscle mass.  Ongoing  GOAL:   Patient will meet greater than or equal to 90% of their needs  Progressing  MONITOR:   PO intake, Supplement acceptance, Labs, Weight trends  REASON FOR ASSESSMENT:   Rounds   ASSESSMENT:   Pt with PMH of COPD, multiple falls, esophagitis, pyloric stents, nissen fundoplication, diverticulosis, and hypothyroidism. Presets this admission with GI bleed.   Pt currently tolerating soft diet. Breakfast at bedside upon entering the room with 50% eaten. Pt reports appetite is slowly increasing with no complications upon eating. States she does not like to drink Ensure's, however had a half empty bottle at bedside. Wt noted to be up 11 lbs since admission. Charted output shows + 7606 ml in fluid balance. Will continue to monitor toleration and provide supplementation.   Pt very restless in bed. Per RN, might require a sitter.   Medications reviewed and include: MVI, NS @ 50 ml/hr, Protonix @ 25 ml/hr\ Labs reviewed: K 3.4 (L), Calcium 8.3 (L), CBG 83-127  Diet Order:  Diet - low sodium heart healthy DIET SOFT Room service appropriate? Yes; Fluid consistency: Thin  Skin:  Reviewed, no issues  Last BM:  04/07/17  Height:   Ht Readings from Last 1 Encounters:  04/06/17 5' (1.524 m)    Weight:   Wt Readings from Last 1 Encounters:  04/13/17 104 lb 8 oz (47.4 kg)    Ideal Body Weight:  45.5 kg  BMI:  Body mass index is 20.41 kg/m.  Estimated Nutritional Needs:   Kcal:  1300-1500 (30-35 kcal/kg)  Protein:  75-85 grams (1.7-2 g/kg)  Fluid:  >1.3 L/day  EDUCATION NEEDS:   No education needs identified at this  time  Vanessa Kickarly Lonnetta Kniskern RD, LDN Clinical Nutrition Pager # - [phone]

## 2017-04-13 NOTE — Progress Notes (Signed)
PROGRESS NOTE    [name]  BJY:[id number] DOB: Oct 26, 1922 DOA: 04/06/2017 PCP: Gordy SaversKwiatkowski, Peter F, MD    Brief Narrative: Katherine GitelmanBarbara Flynn  is a 83 y.o. female, w hx of Hypothyroidism, mild Dementia, Gerd, Esophagitis, pyloric stenosis,  h/o nissen fundoplication, diverticulosis , EGD 2008=> pyloric stenosis, and esophagitis. Admitted with melena, acute blood loss anemia, Hb of 6.8, underwent EGD, noted to have a gastrojejunal anastomotic ulcer likely NSAID related, stable after transfusion  Assessment & Plan:   Upper Gi bleed: -due to Gastro-jejunal anastomosis ulcer, likely NSAID related -h/o intermittent NSAID use - s/p Blood, EGD and PPI, -patient vomited blood the night of  7-31. 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 Normal examined jejunum. Non-bleeding gastric ulcer with a visible vessel.  Injected. Treated with bipolar cautery PRBC transfusion 8-01,  PRBC 8-02 On Protonix gtt for 72 hours.  Continue with soft diet and Protonix gtt for 24 more hours.   Encephalopathy; she was lethargic after episode of vomiting. Resolved.  Suspect related to hypotension.  Improved.   Leukocytosis;  Reactive. Now normal.     Acute Blood loss anemia -transfused 2 units PRBC on 7/27 -bleeding overnight.  -S/P transfusion  unit, 8-01,  PRBC 8-02 Hb stable at 9.   Elevated troponin -due to demand ischemia from anemia -no chest pain, no EKG changes -2-D echo shows EF of 55-60% and no wall motion abnormalities, no plan for further workup at this time  Mild Dementia -stable  Hypokalemia -replaced.   Hypothyroidism -synthroid   Protein calorie malnutrition (HCC) supplements as tolerated   COPD -stable, nebs PRN  DVT prophylaxis:SCDs Code Status: DNR Family Communication: None at bedside Disposition Plan: SNF if acceptable   Consultants:   [facility]  Procedures -EGD Impression:               -  Benign-appearing esophageal stenosis.                           - A gastrojejunostomy was found, characterized by                            ulceration.                           - One non-bleeding jejunal ulcer with adherent clot.                           - No specimens collected.  Subjective: She is not feeling well, unable to eat in the bed, would like to seat in the recliner to eat breakfast    Objective: Vitals:   04/12/17 1400 04/12/17 2031 04/13/17 0425 04/13/17 1300  BP: (!) 142/56 (!) 132/93 (!) 158/69 (!) 164/81  Pulse: 69 84 66 79  Resp: 18 18 19 18   Temp: 97.9 F (36.6 C) 98 F (36.7 C) (!) 97.5 F (36.4 C) 97.6 F (36.4 C)  TempSrc: Oral Oral Oral Oral  SpO2: 98% 99% 99% 98%  Weight:   47.4 kg (104 lb 8 oz)   Height:        Intake/Output Summary (Last 24 hours) at 04/13/17 1318 Last data filed at 04/13/17 1316  Gross per 24 hour  Intake             1555 ml  Output  975 ml  Net              580 ml   Filed Weights   04/11/17 0556 04/12/17 0617 04/13/17 0425  Weight: 45 kg (99 lb 1.6 oz) 45.2 kg (99 lb 10.4 oz) 47.4 kg (104 lb 8 oz)    Examination:   Gen: NAD Lungs: Normal respiratory effort. CTA CVS: S 1, S 2 RRR Abd: soft, nt, nd Extremities: no edema  Skin: no new rashes, large healed scab left lower leg   Data Reviewed:   CBC:  Recent Labs Lab 04/10/17 1603 04/11/17 0421 04/11/17 1020 04/12/17 0407 04/13/17 0444  WBC 6.3 17.3* 6.7 5.8 5.3  HGB 9.6* 8.1* 7.5* 7.6* 9.6*  HCT 30.4* 25.3* 23.3* 23.1* 29.7*  MCV 76.6* 78.8 78.2 77.8* 80.1  PLT 433* 403* 383 309 308   Basic Metabolic Panel:  Recent Labs Lab 04/09/17 1231 04/10/17 1050 04/11/17 1020 04/12/17 0407 04/13/17 0444  NA 135 137 138 141 141  K 3.2* 4.1 3.6 3.2* 3.4*  CL 104 104 106 110 110  CO2 22 25 23 25 24   GLUCOSE 127* 86 116* 83 83  BUN 10 20 22* 18 10  CREATININE 0.60 0.56 0.68 0.62 0.60  CALCIUM 8.1* 8.5* 8.1* 8.0* 8.3*   GFR: Estimated  Creatinine Clearance: 32.9 mL/min (by C-G formula based on SCr of 0.6 mg/dL). Liver Function Tests:  Recent Labs Lab 04/07/17 0129  AST 21  ALT 11*  ALKPHOS 47  BILITOT 0.7  PROT 5.2*  ALBUMIN 2.5*   No results for input(s): LIPASE, AMYLASE in the last 168 hours. No results for input(s): AMMONIA in the last 168 hours. Coagulation Profile: No results for input(s): INR, PROTIME in the last 168 hours. Cardiac Enzymes:  Recent Labs Lab 04/06/17 1356 04/06/17 2322 04/07/17 0129  TROPONINI 0.04* 0.23* 0.22*   BNP (last 3 results) No results for input(s): PROBNP in the last 8760 hours. HbA1C: No results for input(s): HGBA1C in the last 72 hours. CBG: No results for input(s): GLUCAP in the last 168 hours. Lipid Profile: No results for input(s): CHOL, HDL, LDLCALC, TRIG, CHOLHDL, LDLDIRECT in the last 72 hours. Thyroid Function Tests: No results for input(s): TSH, T4TOTAL, FREET4, T3FREE, THYROIDAB in the last 72 hours. Anemia Panel: No results for input(s): VITAMINB12, FOLATE, FERRITIN, TIBC, IRON, RETICCTPCT in the last 72 hours. Urine analysis:    Component Value Date/Time   COLORURINE YELLOW 06/05/2016 1600   APPEARANCEUR CLOUDY (A) 06/05/2016 1600   LABSPEC 1.026 06/05/2016 1600   PHURINE 5.5 06/05/2016 1600   GLUCOSEU NEGATIVE 06/05/2016 1600   HGBUR SMALL (A) 06/05/2016 1600   BILIRUBINUR NEGATIVE 06/05/2016 1600   KETONESUR 15 (A) 06/05/2016 1600   PROTEINUR NEGATIVE 06/05/2016 1600   UROBILINOGEN 0.2 04/20/2015 1452   NITRITE POSITIVE (A) 06/05/2016 1600   LEUKOCYTESUR SMALL (A) 06/05/2016 1600   Sepsis Labs: @LABRCNTIP (procalcitonin:4,lacticidven:4)  ) Recent Results (from the past 240 hour(s))  MRSA PCR Screening     Status: Abnormal   Collection Time: 04/06/17 12:30 PM  Result Value Ref Range Status   MRSA by PCR POSITIVE (A) NEGATIVE Final    Comment:        The GeneXpert MRSA Assay (FDA approved for NASAL specimens only), is one component of  a comprehensive MRSA colonization surveillance program. It is not intended to diagnose MRSA infection nor to guide or monitor treatment for MRSA infections. RESULT CALLED TO, READ BACK BY AND VERIFIED WITH: A.KOONTZ RN AT 1709 ON  04/06/17 BY S.VANHOORNE MLS          Radiology Studies: No results found.      Scheduled Meds: . feeding supplement (ENSURE ENLIVE)  237 mL Oral BID BM  . levothyroxine  100 mcg Oral QAC breakfast  . metoprolol tartrate  25 mg Oral BID  . multivitamin with minerals  1 tablet Oral Daily  . [START ON 04/14/2017] pantoprazole  40 mg Intravenous Q12H  . sucralfate  1 g Oral TID WC & HS   Continuous Infusions: . sodium chloride 50 mL/hr at 04/13/17 0152  . pantoprozole (PROTONIX) infusion 8 mg/hr (04/13/17 1211)     LOS: 7 days    Time spent: 35min    Akeria Hedstrom, Md.  [facility] Pager [phone]  If 7PM-7AM, please contact night-coverage [email or web address] Password [email or web address] 04/13/2017, 1:18 PM

## 2017-04-14 DIAGNOSIS — M1990 Unspecified osteoarthritis, unspecified site: Secondary | ICD-10-CM | POA: Diagnosis not present

## 2017-04-14 DIAGNOSIS — R2681 Unsteadiness on feet: Secondary | ICD-10-CM | POA: Diagnosis not present

## 2017-04-14 DIAGNOSIS — E039 Hypothyroidism, unspecified: Secondary | ICD-10-CM | POA: Diagnosis not present

## 2017-04-14 DIAGNOSIS — D72829 Elevated white blood cell count, unspecified: Secondary | ICD-10-CM | POA: Diagnosis not present

## 2017-04-14 DIAGNOSIS — Z79899 Other long term (current) drug therapy: Secondary | ICD-10-CM | POA: Diagnosis not present

## 2017-04-14 DIAGNOSIS — R079 Chest pain, unspecified: Secondary | ICD-10-CM | POA: Diagnosis not present

## 2017-04-14 DIAGNOSIS — S81801D Unspecified open wound, right lower leg, subsequent encounter: Secondary | ICD-10-CM | POA: Diagnosis not present

## 2017-04-14 DIAGNOSIS — I10 Essential (primary) hypertension: Secondary | ICD-10-CM | POA: Diagnosis not present

## 2017-04-14 DIAGNOSIS — K920 Hematemesis: Secondary | ICD-10-CM | POA: Diagnosis not present

## 2017-04-14 DIAGNOSIS — Q400 Congenital hypertrophic pyloric stenosis: Secondary | ICD-10-CM | POA: Diagnosis not present

## 2017-04-14 DIAGNOSIS — R41841 Cognitive communication deficit: Secondary | ICD-10-CM | POA: Diagnosis not present

## 2017-04-14 DIAGNOSIS — R220 Localized swelling, mass and lump, head: Secondary | ICD-10-CM | POA: Diagnosis not present

## 2017-04-14 DIAGNOSIS — E876 Hypokalemia: Secondary | ICD-10-CM | POA: Diagnosis not present

## 2017-04-14 DIAGNOSIS — E440 Moderate protein-calorie malnutrition: Secondary | ICD-10-CM | POA: Diagnosis not present

## 2017-04-14 DIAGNOSIS — R1314 Dysphagia, pharyngoesophageal phase: Secondary | ICD-10-CM | POA: Diagnosis not present

## 2017-04-14 DIAGNOSIS — W010XXA Fall on same level from slipping, tripping and stumbling without subsequent striking against object, initial encounter: Secondary | ICD-10-CM | POA: Diagnosis not present

## 2017-04-14 DIAGNOSIS — S098XXA Other specified injuries of head, initial encounter: Secondary | ICD-10-CM | POA: Diagnosis not present

## 2017-04-14 DIAGNOSIS — K250 Acute gastric ulcer with hemorrhage: Secondary | ICD-10-CM | POA: Diagnosis not present

## 2017-04-14 DIAGNOSIS — K284 Chronic or unspecified gastrojejunal ulcer with hemorrhage: Secondary | ICD-10-CM | POA: Diagnosis not present

## 2017-04-14 DIAGNOSIS — G308 Other Alzheimer's disease: Secondary | ICD-10-CM | POA: Diagnosis not present

## 2017-04-14 DIAGNOSIS — S199XXA Unspecified injury of neck, initial encounter: Secondary | ICD-10-CM | POA: Diagnosis not present

## 2017-04-14 DIAGNOSIS — Y999 Unspecified external cause status: Secondary | ICD-10-CM | POA: Diagnosis not present

## 2017-04-14 DIAGNOSIS — S0990XA Unspecified injury of head, initial encounter: Secondary | ICD-10-CM | POA: Diagnosis not present

## 2017-04-14 DIAGNOSIS — J449 Chronic obstructive pulmonary disease, unspecified: Secondary | ICD-10-CM | POA: Diagnosis not present

## 2017-04-14 DIAGNOSIS — R1084 Generalized abdominal pain: Secondary | ICD-10-CM | POA: Diagnosis not present

## 2017-04-14 DIAGNOSIS — R278 Other lack of coordination: Secondary | ICD-10-CM | POA: Diagnosis not present

## 2017-04-14 DIAGNOSIS — Y929 Unspecified place or not applicable: Secondary | ICD-10-CM | POA: Diagnosis not present

## 2017-04-14 DIAGNOSIS — R634 Abnormal weight loss: Secondary | ICD-10-CM | POA: Diagnosis not present

## 2017-04-14 DIAGNOSIS — K254 Chronic or unspecified gastric ulcer with hemorrhage: Secondary | ICD-10-CM | POA: Diagnosis not present

## 2017-04-14 DIAGNOSIS — R531 Weakness: Secondary | ICD-10-CM | POA: Diagnosis not present

## 2017-04-14 DIAGNOSIS — G894 Chronic pain syndrome: Secondary | ICD-10-CM | POA: Diagnosis not present

## 2017-04-14 DIAGNOSIS — M6281 Muscle weakness (generalized): Secondary | ICD-10-CM | POA: Diagnosis not present

## 2017-04-14 DIAGNOSIS — D500 Iron deficiency anemia secondary to blood loss (chronic): Secondary | ICD-10-CM | POA: Diagnosis not present

## 2017-04-14 DIAGNOSIS — S0181XA Laceration without foreign body of other part of head, initial encounter: Secondary | ICD-10-CM | POA: Diagnosis not present

## 2017-04-14 DIAGNOSIS — S81801A Unspecified open wound, right lower leg, initial encounter: Secondary | ICD-10-CM | POA: Diagnosis not present

## 2017-04-14 DIAGNOSIS — R2689 Other abnormalities of gait and mobility: Secondary | ICD-10-CM | POA: Diagnosis not present

## 2017-04-14 DIAGNOSIS — R1312 Dysphagia, oropharyngeal phase: Secondary | ICD-10-CM | POA: Diagnosis not present

## 2017-04-14 DIAGNOSIS — S0993XA Unspecified injury of face, initial encounter: Secondary | ICD-10-CM | POA: Diagnosis not present

## 2017-04-14 DIAGNOSIS — R296 Repeated falls: Secondary | ICD-10-CM | POA: Diagnosis not present

## 2017-04-14 DIAGNOSIS — Y9389 Activity, other specified: Secondary | ICD-10-CM | POA: Diagnosis not present

## 2017-04-14 DIAGNOSIS — K922 Gastrointestinal hemorrhage, unspecified: Secondary | ICD-10-CM | POA: Diagnosis not present

## 2017-04-14 LAB — BASIC METABOLIC PANEL
ANION GAP: 9 (ref 5–15)
BUN: 6 mg/dL (ref 6–20)
CO2: 26 mmol/L (ref 22–32)
Calcium: 8.4 mg/dL — ABNORMAL LOW (ref 8.9–10.3)
Chloride: 104 mmol/L (ref 101–111)
Creatinine, Ser: 0.63 mg/dL (ref 0.44–1.00)
GLUCOSE: 85 mg/dL (ref 65–99)
POTASSIUM: 3.3 mmol/L — AB (ref 3.5–5.1)
Sodium: 139 mmol/L (ref 135–145)

## 2017-04-14 LAB — CBC
HEMATOCRIT: 31.9 % — AB (ref 36.0–46.0)
Hemoglobin: 10.4 g/dL — ABNORMAL LOW (ref 12.0–15.0)
MCH: 26.7 pg (ref 26.0–34.0)
MCHC: 32.6 g/dL (ref 30.0–36.0)
MCV: 81.8 fL (ref 78.0–100.0)
PLATELETS: 369 10*3/uL (ref 150–400)
RBC: 3.9 MIL/uL (ref 3.87–5.11)
RDW: 18.9 % — ABNORMAL HIGH (ref 11.5–15.5)
WBC: 6.6 10*3/uL (ref 4.0–10.5)

## 2017-04-14 MED ORDER — POTASSIUM CHLORIDE CRYS ER 20 MEQ PO TBCR
40.0000 meq | EXTENDED_RELEASE_TABLET | Freq: Once | ORAL | Status: AC
  Administered 2017-04-14: 40 meq via ORAL
  Filled 2017-04-14: qty 2

## 2017-04-14 MED ORDER — AMLODIPINE BESYLATE 5 MG PO TABS: 5.0000 mg | ORAL_TABLET | Freq: Every day | ORAL | 0 refills | Status: AC

## 2017-04-14 MED ORDER — AMLODIPINE BESYLATE 5 MG PO TABS
5.0000 mg | ORAL_TABLET | Freq: Every day | ORAL | Status: DC
  Administered 2017-04-14: 5 mg via ORAL
  Filled 2017-04-14: qty 1

## 2017-04-14 MED ORDER — SUCRALFATE 1 GM/10ML PO SUSP: 1.0000 g | Freq: Three times a day (TID) | ORAL | 0 refills | Status: DC

## 2017-04-14 NOTE — Progress Notes (Addendum)
Report given via telephone to RN Harvin [facility]elli [facility].

## 2017-04-14 NOTE — Clinical Social Work Placement (Signed)
Pt ready for discharge today and going to [facility]. Pt and daughter-Katherine Flynn (via phone call) are aware and agreeable to discharge plan. CSW confirmed bed with [facility]phanie [facility] and sent clinicals through the [facility]. RN made aware and will call report to [phone] for room 401A. CSW arranged transportation with [facility]. Signed DNR on chart. CSW signing off as no further Social Work needs identified.   CLINICAL SOCIAL WORK PLACEMENT  NOTE  Date:  04/14/2017  Patient Details  Name: Katherine Flynn MRN: [id number] Date of Birth: 30-Nov-1922  Clinical Social Work is seeking post-discharge placement for this patient at the Skilled  Nursing Facility level of care (*CSW will initial, date and re-position this form in  chart as items are completed):  Yes   Patient/family provided with [facility] Clinical Social Work Department's list of facilities offering this level of care within the geographic area requested by the patient (or if unable, by the patient's family).  Yes   Patient/family informed of their freedom to choose among providers that offer the needed level of care, that participate in Medicare, Medicaid or managed care program needed by the patient, have an available bed and are willing to accept the patient.  Yes   Patient/family informed of [place] [facility]'s ownership interest in [facility] and [facility], as well as of the fact that they are under no obligation to receive care at these facilities.  PASRR submitted to EDS on 04/10/17     PASRR number received on 04/10/17     Existing PASRR number confirmed on       FL2 transmitted to all facilities in geographic area requested by pt/family on 04/10/17     FL2 transmitted to all facilities within larger geographic area on       Patient informed that his/her managed care company has contracts with or will negotiate with certain facilities, including the following:        Yes   Patient/family informed of  bed offers received.  Patient chooses bed at [facility]     Physician recommends and patient chooses bed at      Patient to be transferred to [facility] on 04/14/17.  Patient to be transferred to facility by [facility]     Patient family notified on 04/14/17 of transfer.  Name of family member notified:  Daughter-Katherine Flynn [phone]     PHYSICIAN Please prepare prescriptions     Additional Comment:    _______________________________________________ Dominic PeaJeneya G Collie Wernick, LCSW 04/14/2017, 12:05 PM

## 2017-04-14 NOTE — Clinical Social Work Note (Signed)
CSW paged MD. Awaiting a call for discharge status. Pt has bed available at [facility]. CSW continuing to follow for discharge needs.   Corlis HoveJeneya Cayci Mcnabb, LCSWA, LCASA  Clinical Social Work Jamelle Rushing([place]kend coverage) [phone]

## 2017-04-14 NOTE — Discharge Summary (Signed)
Physician Discharge Summary  Katherine Flynn [id number] DOB: Oct 11, 1922 DOA: 04/06/2017  PCP: Gordy SaversKwiatkowski, Peter F, MD  Admit date: 04/06/2017 Discharge date: 04/14/2017  Admitted From: Home  Disposition: SNF  Recommendations for Outpatient Follow-up:  1. Follow up with PCP in 1-2 weeks 2. Please obtain BMP/CBC in one week 3. Follow K level.    Discharge Condition: stable.  CODE STATUS: DNR Diet recommendation: Heart Healthy  Brief/Interim Summary: Katherine Flynn a 83 y.o.female,w hx of Hypothyroidism, mild Dementia, Gerd, Esophagitis, pyloric stenosis, h/onissen fundoplication, diverticulosis , EGD 2008=>pyloric stenosis, and esophagitis. Admitted with melena, acute blood loss anemia, Hb of 6.8, underwent EGD, noted to have a gastrojejunal anastomotic ulcer likely NSAID related, stable after transfusion  Assessment & Plan:   Upper Gi bleed: -due to Gastro-jejunal anastomosis ulcer, likely NSAID related -h/o intermittent NSAID use - s/p Blood, EGD and PPI, -patient vomited blood the night of  7-31. 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 Normal examined jejunum. Non-bleeding gastric ulcer with a visible vessel.Injected. Treated with bipolar cautery PRBC transfusion 8-01,  PRBC 8-02 Received  Protonix gtt for 72 hours.  Continue with soft diet .  Discharge on Protonix 40 Mg BID> also discharge on Carafate.   Encephalopathy; she was lethargic after episode of vomiting. Resolved.  Suspect related to hypotension.  Improved.   Leukocytosis;  Reactive. Now normal.     Acute Blood loss anemia -transfused 2 units PRBC on 7/27 -bleeding overnight.  -S/P transfusion  unit, 8-01,  PRBC 8-02 Hb stable at 10.   Elevated troponin -due to demand ischemia from anemia -no chest pain, no EKG changes -2-D echo shows EF of 55-60% and no wall motion abnormalities, no plan for further workup at this  time  Mild Dementia -stable  Hypokalemia -replaced.  Follow Bmet   Hypothyroidism -synthroid   Protein calorie malnutrition (HCC) supplements as tolerated   COPD -stable, nebs PRN  Discharge Diagnoses:  Active Problems:   Hypothyroidism   GERD   Hypokalemia   Protein calorie malnutrition (HCC)   GI bleeding   Tachycardia   Acute upper gastrointestinal bleeding   Bloody emesis   Acute gastric ulcer with hemorrhage    Discharge Instructions  Discharge Instructions    Diet - low sodium heart healthy    Complete by:  As directed    Diet - low sodium heart healthy    Complete by:  As directed    Increase activity slowly    Complete by:  As directed    Increase activity slowly    Complete by:  As directed      Allergies as of 04/14/2017   No Known Allergies     Medication List    TAKE these medications   albuterol 108 (90 Base) MCG/ACT inhaler Commonly known as:  PROVENTIL HFA;VENTOLIN HFA Inhale 2 puffs into the lungs every 6 (six) hours as needed for wheezing or shortness of breath.   amLODipine 5 MG tablet Commonly known as:  NORVASC Take 1 tablet (5 mg total) by mouth daily.   levothyroxine 100 MCG tablet Commonly known as:  SYNTHROID, LEVOTHROID Take 1 tablet (100 mcg total) by mouth daily.   metoprolol tartrate 25 MG tablet Commonly known as:  LOPRESSOR Take 1 tablet (25 mg total) by mouth 2 (two) times daily.   multivitamin with minerals Tabs tablet Take 1 tablet by mouth daily.   pantoprazole 40 MG tablet Commonly known as:  PROTONIX Take 1 tablet (40 mg total) by  mouth 2 (two) times daily.   sucralfate 1 GM/10ML suspension Commonly known as:  CARAFATE Take 10 mLs (1 g total) by mouth 4 (four) times daily -  with meals and at bedtime.       Contact information for follow-up providers    Gordy Savers, MD. Schedule an appointment as soon as possible for a visit in 1 week(s).   Specialty:  Internal Medicine Contact  information: [address] [place] [address] [phone]            Contact information for after-discharge care    Destination    [facility] Follow up.   Specialty:  Skilled Nursing Facility Contact information: [address] [name] [address] [phone]                 No Known Allergies  Consultations:  GI   Procedures/Studies: Dg Chest Port 1 View  Result Date: 04/11/2017 CLINICAL DATA:  Bloody emesis. EXAM: PORTABLE CHEST 1 VIEW COMPARISON:  10/11/2016. FINDINGS: Mediastinum and hilar structures normal. Heart size normal. Minimal atelectasis/infiltrate previously identified in the lingula has partially cleared. COPD. No prominent pleural effusion or pneumothorax. IMPRESSION: Minimal atelectasis/ infiltrate previously identified the lingula has partially cleared. COPD . No other focal abnormalities identified. Electronically Signed   By: Maisie Fus  Register   On: 04/11/2017 07:02       Subjective: She is feeling well, eating well.    Discharge Exam: Vitals:   04/14/17 0531 04/14/17 1100  BP: (!) 175/81 (!) 147/77  Pulse: 82 80  Resp: 18   Temp: 98.1 F (36.7 C)    Vitals:   04/13/17 1300 04/13/17 2018 04/14/17 0531 04/14/17 1100  BP: (!) 164/81 (!) 188/88 (!) 175/81 (!) 147/77  Pulse: 79 87 82 80  Resp: 18 20 18    Temp: 97.6 F (36.4 C) 98 F (36.7 C) 98.1 F (36.7 C)   TempSrc: Oral Oral Oral   SpO2: 98% 100% 97%   Weight:   44.6 kg (98 lb 5.2 oz)   Height:        General: Pt is alert, awake, not in acute distress Cardiovascular: RRR, S1/S2 +, no rubs, no gallops Respiratory: CTA bilaterally, no wheezing, no rhonchi Abdominal: Soft, NT, ND, bowel sounds + Extremities: no edema, no cyanosis    The results of significant diagnostics from this hospitalization (including imaging, microbiology, ancillary and laboratory) are listed below for reference.     Microbiology: Recent  Results (from the past 240 hour(s))  MRSA PCR Screening     Status: Abnormal   Collection Time: 04/06/17 12:30 PM  Result Value Ref Range Status   MRSA by PCR POSITIVE (A) NEGATIVE Final    Comment:        The GeneXpert MRSA Assay (FDA approved for NASAL specimens only), is one component of a comprehensive MRSA colonization surveillance program. It is not intended to diagnose MRSA infection nor to guide or monitor treatment for MRSA infections. RESULT CALLED TO, READ BACK BY AND VERIFIED WITH: A.KOONTZ RN AT 1709 ON 04/06/17 BY S.VANHOORNE MLS      Labs: BNP (last 3 results) No results for input(s): BNP in the last 8760 hours. Basic Metabolic Panel:  Recent Labs Lab 04/10/17 1050 04/11/17 1020 04/12/17 0407 04/13/17 0444 04/14/17 0422  NA 137 138 141 141 139  K 4.1 3.6 3.2* 3.4* 3.3*  CL 104 106 110 110 104  CO2 25 23 25 24 26   GLUCOSE 86  116* 83 83 85  BUN 20 22* 18 10 6   CREATININE 0.56 0.68 0.62 0.60 0.63  CALCIUM 8.5* 8.1* 8.0* 8.3* 8.4*   Liver Function Tests: No results for input(s): AST, ALT, ALKPHOS, BILITOT, PROT, ALBUMIN in the last 168 hours. No results for input(s): LIPASE, AMYLASE in the last 168 hours. No results for input(s): AMMONIA in the last 168 hours. CBC:  Recent Labs Lab 04/11/17 0421 04/11/17 1020 04/12/17 0407 04/13/17 0444 04/14/17 0422  WBC 17.3* 6.7 5.8 5.3 6.6  HGB 8.1* 7.5* 7.6* 9.6* 10.4*  HCT 25.3* 23.3* 23.1* 29.7* 31.9*  MCV 78.8 78.2 77.8* 80.1 81.8  PLT 403* 383 309 308 369   Cardiac Enzymes: No results for input(s): CKTOTAL, CKMB, CKMBINDEX, TROPONINI in the last 168 hours. BNP: Invalid input(s): POCBNP CBG: No results for input(s): GLUCAP in the last 168 hours. D-Dimer No results for input(s): DDIMER in the last 72 hours. Hgb A1c No results for input(s): HGBA1C in the last 72 hours. Lipid Profile No results for input(s): CHOL, HDL, LDLCALC, TRIG, CHOLHDL, LDLDIRECT in the last 72 hours. Thyroid function  studies No results for input(s): TSH, T4TOTAL, T3FREE, THYROIDAB in the last 72 hours.  Invalid input(s): FREET3 Anemia work up No results for input(s): VITAMINB12, FOLATE, FERRITIN, TIBC, IRON, RETICCTPCT in the last 72 hours. Urinalysis    Component Value Date/Time   COLORURINE YELLOW 06/05/2016 1600   APPEARANCEUR CLOUDY (A) 06/05/2016 1600   LABSPEC 1.026 06/05/2016 1600   PHURINE 5.5 06/05/2016 1600   GLUCOSEU NEGATIVE 06/05/2016 1600   HGBUR SMALL (A) 06/05/2016 1600   BILIRUBINUR NEGATIVE 06/05/2016 1600   KETONESUR 15 (A) 06/05/2016 1600   PROTEINUR NEGATIVE 06/05/2016 1600   UROBILINOGEN 0.2 04/20/2015 1452   NITRITE POSITIVE (A) 06/05/2016 1600   LEUKOCYTESUR SMALL (A) 06/05/2016 1600   Sepsis Labs Invalid input(s): PROCALCITONIN,  WBC,  LACTICIDVEN Microbiology Recent Results (from the past 240 hour(s))  MRSA PCR Screening     Status: Abnormal   Collection Time: 04/06/17 12:30 PM  Result Value Ref Range Status   MRSA by PCR POSITIVE (A) NEGATIVE Final    Comment:        The GeneXpert MRSA Assay (FDA approved for NASAL specimens only), is one component of a comprehensive MRSA colonization surveillance program. It is not intended to diagnose MRSA infection nor to guide or monitor treatment for MRSA infections. RESULT CALLED TO, READ BACK BY AND VERIFIED WITH: A.KOONTZ RN AT 1709 ON 04/06/17 BY S.VANHOORNE MLS      Time coordinating discharge: Over 30 minutes  SIGNED:   Alba Coryegalado, Ross Bender A, MD  [facility] 04/14/2017, 11:02 AM Pager [phone]  If 7PM-7AM, please contact night-coverage [email or web address] Password [email or web address]

## 2017-04-15 DIAGNOSIS — R2689 Other abnormalities of gait and mobility: Secondary | ICD-10-CM | POA: Diagnosis not present

## 2017-04-15 DIAGNOSIS — E440 Moderate protein-calorie malnutrition: Secondary | ICD-10-CM | POA: Diagnosis not present

## 2017-04-15 DIAGNOSIS — G308 Other Alzheimer's disease: Secondary | ICD-10-CM | POA: Diagnosis not present

## 2017-04-15 DIAGNOSIS — R531 Weakness: Secondary | ICD-10-CM | POA: Diagnosis not present

## 2017-04-15 DIAGNOSIS — K922 Gastrointestinal hemorrhage, unspecified: Secondary | ICD-10-CM | POA: Diagnosis not present

## 2017-04-15 DIAGNOSIS — M1990 Unspecified osteoarthritis, unspecified site: Secondary | ICD-10-CM | POA: Diagnosis not present

## 2017-04-17 DIAGNOSIS — S81801A Unspecified open wound, right lower leg, initial encounter: Secondary | ICD-10-CM | POA: Diagnosis not present

## 2017-04-17 DIAGNOSIS — S81801D Unspecified open wound, right lower leg, subsequent encounter: Secondary | ICD-10-CM | POA: Diagnosis not present

## 2017-04-23 ENCOUNTER — Ambulatory Visit (INDEPENDENT_AMBULATORY_CARE_PROVIDER_SITE_OTHER): Payer: Medicare HMO | Admitting: Internal Medicine

## 2017-04-23 ENCOUNTER — Encounter: Payer: Self-pay | Admitting: Internal Medicine

## 2017-04-23 VITALS — BP 122/62 | HR 80 | Temp 98.1°F | Ht 60.0 in | Wt 87.0 lb

## 2017-04-23 DIAGNOSIS — K284 Chronic or unspecified gastrojejunal ulcer with hemorrhage: Secondary | ICD-10-CM

## 2017-04-23 DIAGNOSIS — Q400 Congenital hypertrophic pyloric stenosis: Secondary | ICD-10-CM

## 2017-04-23 DIAGNOSIS — D500 Iron deficiency anemia secondary to blood loss (chronic): Secondary | ICD-10-CM | POA: Diagnosis not present

## 2017-04-23 DIAGNOSIS — R634 Abnormal weight loss: Secondary | ICD-10-CM

## 2017-04-23 DIAGNOSIS — I10 Essential (primary) hypertension: Secondary | ICD-10-CM

## 2017-04-23 DIAGNOSIS — K922 Gastrointestinal hemorrhage, unspecified: Secondary | ICD-10-CM

## 2017-04-23 DIAGNOSIS — E440 Moderate protein-calorie malnutrition: Secondary | ICD-10-CM

## 2017-04-23 DIAGNOSIS — E876 Hypokalemia: Secondary | ICD-10-CM

## 2017-04-23 MED ORDER — ESCITALOPRAM OXALATE 5 MG PO TABS: 5.0000 mg | ORAL_TABLET | Freq: Every day | ORAL | 2 refills | Status: DC

## 2017-04-23 NOTE — Patient Instructions (Signed)
Limit your sodium (Salt) intake  Return in 3 months for follow-up   

## 2017-04-23 NOTE — Progress Notes (Signed)
Subjective:    Patient ID: [name] A Kingsley, female    DOB: June 25, 1923, 83 y.o.   MRN: [id number]  HPI  83 year old patient who is seen today following a recent hospital discharge and forward transitional care management. She was hospitalized with melena and acute blood loss anemia.  Hemoglobin down to 6.8.  The patient underwent EGD times 2 with treatment of a gastrojejunal anastomotic ulcer , likely NSAID related.  She stabilized after blood transfusion At the present time.  She is undergoing further rehabilitation at [facility]. She is coming by her son today.  She remains a bit weak and he feels that she is a bit depressed and unmotivated.  She has been on low-dose antidepressants in the past with benefit. Since her discharge.  No recurrent melena.  She is tolerating a soft diet without difficulty. Denies any abdominal pain  Past Medical History:  Diagnosis Date  . Chronic pain syndrome   . COPD (chronic obstructive pulmonary disease) (HCC)   . GERD (gastroesophageal reflux disease)   . History of pyloric stenosis   . Hypertension   . Macular degeneration   . Osteoarthritis   . Thyroid disease   . Weight loss      Social History   Social History  . Marital status: Widowed    Spouse name: N/A  . Number of children: N/A  . Years of education: N/A   Occupational History  . Not on file.   Social History Main Topics  . Smoking status: Never Smoker  . Smokeless tobacco: Never Used  . Alcohol use No  . Drug use: No  . Sexual activity: Not on file   Other Topics Concern  . Not on file   Social History Narrative  . No narrative on file    Past Surgical History:  Procedure Laterality Date  . APPENDECTOMY    . CATARACT EXTRACTION    . ESOPHAGOGASTRODUODENOSCOPY (EGD) WITH PROPOFOL N/A 04/07/2017   Procedure: ESOPHAGOGASTRODUODENOSCOPY (EGD) WITH PROPOFOL;  Surgeon: Sherrilyn Ristanis, Henry L III, MD;  Location: [facility];  Service: Gastroenterology;  Laterality:  N/A;  . ESOPHAGOGASTRODUODENOSCOPY (EGD) WITH PROPOFOL N/A 04/11/2017   Procedure: ESOPHAGOGASTRODUODENOSCOPY (EGD) WITH PROPOFOL;  Surgeon: Napoleon FormNandigam, Kavitha V, MD;  Location: [facility];  Service: Endoscopy;  Laterality: N/A;  . JOINT REPLACEMENT    . nissan fundoplication  08/2007  . ORIF HIP FRACTURE    . TUBAL LIGATION      No family history on file.  No Known Allergies  Current Outpatient Prescriptions on File Prior to Visit  Medication Sig Dispense Refill  . albuterol (PROVENTIL HFA;VENTOLIN HFA) 108 (90 Base) MCG/ACT inhaler Inhale 2 puffs into the lungs every 6 (six) hours as needed for wheezing or shortness of breath. 1 Inhaler 0  . amLODipine (NORVASC) 5 MG tablet Take 1 tablet (5 mg total) by mouth daily. 30 tablet 0  . levothyroxine (SYNTHROID, LEVOTHROID) 100 MCG tablet Take 1 tablet (100 mcg total) by mouth daily. 90 tablet 4  . metoprolol tartrate (LOPRESSOR) 25 MG tablet Take 1 tablet (25 mg total) by mouth 2 (two) times daily. 180 tablet 4  . Multiple Vitamin (MULTIVITAMIN WITH MINERALS) TABS tablet Take 1 tablet by mouth daily.    . pantoprazole (PROTONIX) 40 MG tablet Take 1 tablet (40 mg total) by mouth 2 (two) times daily. 60 tablet 0  . sucralfate (CARAFATE) 1 GM/10ML suspension Take 10 mLs (1 g total) by mouth 4 (four) times daily -  with meals  and at bedtime. 420 mL 0   No current facility-administered medications on file prior to visit.     BP 122/62 (BP Location: Left Arm, Patient Position: Sitting, Cuff Size: Normal)   Pulse 80   Temp 98.1 F (36.7 C) (Oral)   Ht 5' (1.524 m)   Wt 87 lb (39.5 kg)   BMI 16.99 kg/m     Review of Systems  Constitutional: Positive for fatigue and unexpected weight change.  HENT: Negative for congestion, dental problem, hearing loss, rhinorrhea, sinus pressure, sore throat and tinnitus.   Eyes: Negative for pain, discharge and visual disturbance.  Respiratory: Negative for cough and shortness of breath.     Cardiovascular: Negative for chest pain, palpitations and leg swelling.  Gastrointestinal: Negative for abdominal distention, abdominal pain, blood in stool, constipation, diarrhea, nausea and vomiting.  Genitourinary: Negative for difficulty urinating, dysuria, flank pain, frequency, hematuria, pelvic pain, urgency, vaginal bleeding, vaginal discharge and vaginal pain.  Musculoskeletal: Negative for arthralgias, gait problem and joint swelling.  Skin: Negative for rash.  Neurological: Positive for weakness. Negative for dizziness, syncope, speech difficulty, numbness and headaches.  Hematological: Negative for adenopathy.  Psychiatric/Behavioral: Positive for dysphoric mood. Negative for agitation and behavioral problems. The patient is not nervous/anxious.        Objective:   Physical Exam  Constitutional: She is oriented to person, place, and time. She appears well-developed and well-nourished.  Alert and appropriate Generally weak and wheelchair bound  Blood pressure stable O2 saturation 96%.  Pulse 80  HENT:  Head: Normocephalic.  Right Ear: External ear normal.  Left Ear: External ear normal.  Mouth/Throat: Oropharynx is clear and moist.  Eyes: Pupils are equal, round, and reactive to light. Conjunctivae and EOM are normal.  Neck: Normal range of motion. Neck supple. No thyromegaly present.  Cardiovascular: Normal rate, regular rhythm, normal heart sounds and intact distal pulses.   No tachycardia  Pulmonary/Chest: Effort normal and breath sounds normal.  Abdominal: Soft. Bowel sounds are normal. She exhibits no mass. There is no tenderness. There is no rebound and no guarding.  Musculoskeletal: Normal range of motion.  Lymphadenopathy:    She has no cervical adenopathy.  Neurological: She is alert and oriented to person, place, and time.  Skin: Skin is warm and dry. No rash noted.  Psychiatric: She has a normal mood and affect. Her behavior is normal.           Assessment & Plan:   Status post upper GI bleeding.  Will check CBC History of electrolyte abnormalities.  Will check  Follow-up lab History of gastrojejunal anastomotic ulcer.  Continue Carafate and PPI therapy Essential hypertension History depression.  We'll start low-dose Lexapro 5 mg daily  Follow-up 4 weeks Patient will report any clinical worsening Continue physical therapy  Rogelia Boga

## 2017-04-24 DIAGNOSIS — S81801D Unspecified open wound, right lower leg, subsequent encounter: Secondary | ICD-10-CM | POA: Diagnosis not present

## 2017-04-24 DIAGNOSIS — S81801A Unspecified open wound, right lower leg, initial encounter: Secondary | ICD-10-CM | POA: Diagnosis not present

## 2017-04-24 LAB — CBC WITH DIFFERENTIAL/PLATELET
BASOS PCT: 1 % (ref 0.0–3.0)
Basophils Absolute: 0.1 10*3/uL (ref 0.0–0.1)
EOS ABS: 0.4 10*3/uL (ref 0.0–0.7)
EOS PCT: 6.3 % — AB (ref 0.0–5.0)
HEMATOCRIT: 36.3 % (ref 36.0–46.0)
Hemoglobin: 11.4 g/dL — ABNORMAL LOW (ref 12.0–15.0)
LYMPHS PCT: 16.5 % (ref 12.0–46.0)
Lymphs Abs: 1 10*3/uL (ref 0.7–4.0)
MCHC: 31.5 g/dL (ref 30.0–36.0)
MCV: 85.5 fl (ref 78.0–100.0)
MONOS PCT: 7.4 % (ref 3.0–12.0)
Monocytes Absolute: 0.5 10*3/uL (ref 0.1–1.0)
NEUTROS ABS: 4.2 10*3/uL (ref 1.4–7.7)
Neutrophils Relative %: 68.8 % (ref 43.0–77.0)
RBC: 4.24 Mil/uL (ref 3.87–5.11)
RDW: 21.3 % — AB (ref 11.5–15.5)
WBC: 6.1 10*3/uL (ref 4.0–10.5)

## 2017-04-24 LAB — BASIC METABOLIC PANEL
BUN: 14 mg/dL (ref 6–23)
CHLORIDE: 101 meq/L (ref 96–112)
CO2: 30 meq/L (ref 19–32)
CREATININE: 0.71 mg/dL (ref 0.40–1.20)
Calcium: 9 mg/dL (ref 8.4–10.5)
GFR: 81.87 mL/min (ref >60.00)
Glucose, Bld: 144 mg/dL — ABNORMAL HIGH (ref 70–99)
Potassium: 3.9 mEq/L (ref 3.5–5.1)
Sodium: 142 mEq/L (ref 135–145)

## 2017-04-25 ENCOUNTER — Encounter (HOSPITAL_COMMUNITY): Payer: Self-pay | Admitting: Emergency Medicine

## 2017-04-25 ENCOUNTER — Emergency Department (HOSPITAL_COMMUNITY): Payer: Medicare HMO

## 2017-04-25 ENCOUNTER — Emergency Department (HOSPITAL_COMMUNITY)
Admission: EM | Admit: 2017-04-25 | Discharge: 2017-04-25 | Disposition: A | Discharge status: Home / Self Care | Payer: Medicare HMO | Attending: Emergency Medicine | Admitting: Emergency Medicine

## 2017-04-25 DIAGNOSIS — S199XXA Unspecified injury of neck, initial encounter: Secondary | ICD-10-CM | POA: Diagnosis not present

## 2017-04-25 DIAGNOSIS — Y999 Unspecified external cause status: Secondary | ICD-10-CM | POA: Insufficient documentation

## 2017-04-25 DIAGNOSIS — Z79899 Other long term (current) drug therapy: Secondary | ICD-10-CM | POA: Insufficient documentation

## 2017-04-25 DIAGNOSIS — Y929 Unspecified place or not applicable: Secondary | ICD-10-CM | POA: Insufficient documentation

## 2017-04-25 DIAGNOSIS — S0181XA Laceration without foreign body of other part of head, initial encounter: Secondary | ICD-10-CM | POA: Diagnosis not present

## 2017-04-25 DIAGNOSIS — E039 Hypothyroidism, unspecified: Secondary | ICD-10-CM | POA: Diagnosis not present

## 2017-04-25 DIAGNOSIS — G894 Chronic pain syndrome: Secondary | ICD-10-CM | POA: Insufficient documentation

## 2017-04-25 DIAGNOSIS — J449 Chronic obstructive pulmonary disease, unspecified: Secondary | ICD-10-CM | POA: Diagnosis not present

## 2017-04-25 DIAGNOSIS — I10 Essential (primary) hypertension: Secondary | ICD-10-CM | POA: Diagnosis not present

## 2017-04-25 DIAGNOSIS — Y9389 Activity, other specified: Secondary | ICD-10-CM | POA: Insufficient documentation

## 2017-04-25 DIAGNOSIS — R220 Localized swelling, mass and lump, head: Secondary | ICD-10-CM | POA: Diagnosis not present

## 2017-04-25 DIAGNOSIS — W010XXA Fall on same level from slipping, tripping and stumbling without subsequent striking against object, initial encounter: Secondary | ICD-10-CM | POA: Insufficient documentation

## 2017-04-25 MED ORDER — LIDOCAINE HCL (PF) 2 % IJ SOLN
0.0000 mL | Freq: Once | INTRAMUSCULAR | Status: AC | PRN
  Administered 2017-04-25: 10 mL via INTRADERMAL
  Filled 2017-04-25: qty 10

## 2017-04-25 MED ORDER — LIDOCAINE-EPINEPHRINE (PF) 2 %-1:200000 IJ SOLN: 20.0000 mL | Freq: Once | INTRAMUSCULAR | Status: DC

## 2017-04-25 NOTE — ED Notes (Signed)
RN called pt's son [name], per pt request and updated him.

## 2017-04-25 NOTE — Discharge Instructions (Signed)
Sutures need to be removed in five days. That can be done here, at an urgent care center, or at your doctor's office.

## 2017-04-25 NOTE — ED Notes (Signed)
Patient transported to CT 

## 2017-04-25 NOTE — ED Triage Notes (Signed)
Per EMS, pt from [facility], third fall of the day, reports she was getting up to use the bathroom and fell.  Report of a head lac.  Pt reports pain to the right ribs, lungs clear.  Pt alert and oriented at this time, reports "I didn't pass out, but It did hurt my head."

## 2017-04-25 NOTE — ED Notes (Signed)
Son Katherine Flynn called, pt gave permission for RN to talk to him.  He requested a call if he needs to come to the hospital.

## 2017-04-25 NOTE — ED Notes (Signed)
Pt has urinated three times since arrival.  Reports she has to urinate more and sometimes it hurts a little.  Informed Provider

## 2017-04-25 NOTE — ED Provider Notes (Signed)
[facility]-EMERGENCY DEPT Provider Note   CSN: [id number] Arrival date & time: 04/25/17  [id number]  By signing my name below, I, Katherine Flynn, attest that this documentation has been prepared under the direction and in the presence of Dione BoozeGlick, Soledad Budreau, MD. Electronically Signed: Rosana Fretana Flynn, ED Scribe. 04/25/17. 3:55 AM.  History   Chief Complaint Chief Complaint  Patient presents with  . Fall  . Head Laceration   The history is provided by the patient. No language interpreter was used.   HPI Comments: Katherine Flynn is a 83 y.o. female who presents to the Emergency Department via EMS to be evaluate for a mechanical, ground-level fall sustained just PTA. Pt denies LOC but states she does not remember the fall. This was pt's 3rd fall in the past day. Pt reports a headache, laceration to the forehead and right rib pain. No other complaints at this time.  Past Medical History:  Diagnosis Date  . Chronic pain syndrome   . COPD (chronic obstructive pulmonary disease) (HCC)   . GERD (gastroesophageal reflux disease)   . History of pyloric stenosis   . Hypertension   . Macular degeneration   . Osteoarthritis   . Thyroid disease   . Weight loss     Patient Active Problem List   Diagnosis Date Noted  . Acute upper gastrointestinal bleeding   . Bloody emesis   . Acute gastric ulcer with hemorrhage   . GI bleeding 04/06/2017  . Tachycardia 04/06/2017  . UTI (lower urinary tract infection) 05/01/2016  . Dehydration 05/01/2016  . Multiple pelvic fractures 11/04/2015  . Fall at home 11/04/2015  . Prolonged Q-T interval on ECG 11/04/2015  . RBBB 11/04/2015  . Dilated bile duct 11/04/2015  . Pancreatic duct dilated 11/04/2015  . Abnormal CT of the abdomen 11/04/2015  . Fall 04/19/2015  . Protein-calorie malnutrition, severe (HCC) 04/19/2015  . Closed fracture of right iliac wing (HCC) 04/18/2015  . Peri-prosthetic fracture around prosthetic hip 04/18/2015  . Protein calorie  malnutrition (HCC) 04/18/2015  . Hypokalemia 11/24/2014  . Iron deficiency anemia 12/30/2012  . Chronic bronchitis with COPD (chronic obstructive pulmonary disease) (HCC) 11/19/2007  . UTI'S, RECURRENT 11/19/2007  . HEEL SPUR 11/19/2007  . Hypothyroidism 11/04/2007  . Essential hypertension 11/04/2007  . GERD 11/04/2007  . Osteoarthritis 11/04/2007  . WEIGHT LOSS 11/04/2007  . PYLORIC STENOSIS 07/18/2007  . OTHER OBSTRUCTION OF DUODENUM 08/10/2006    Past Surgical History:  Procedure Laterality Date  . APPENDECTOMY    . CATARACT EXTRACTION    . ESOPHAGOGASTRODUODENOSCOPY (EGD) WITH PROPOFOL N/A 04/07/2017   Procedure: ESOPHAGOGASTRODUODENOSCOPY (EGD) WITH PROPOFOL;  Surgeon: Sherrilyn Ristanis, Henry L III, MD;  Location: [facility];  Service: Gastroenterology;  Laterality: N/A;  . ESOPHAGOGASTRODUODENOSCOPY (EGD) WITH PROPOFOL N/A 04/11/2017   Procedure: ESOPHAGOGASTRODUODENOSCOPY (EGD) WITH PROPOFOL;  Surgeon: Napoleon FormNandigam, Kavitha V, MD;  Location: [facility];  Service: Endoscopy;  Laterality: N/A;  . JOINT REPLACEMENT    . nissan fundoplication  08/2007  . ORIF HIP FRACTURE    . TUBAL LIGATION      OB History    Gravida Para Term Preterm AB Living   12 9     3      SAB TAB Ectopic Multiple Live Births                   Home Medications    Prior to Admission medications   Medication Sig Start Date End Date Taking? Authorizing Provider  albuterol (PROVENTIL HFA;VENTOLIN HFA) 108 (90 Base)  MCG/ACT inhaler Inhale 2 puffs into the lungs every 6 (six) hours as needed for wheezing or shortness of breath. 10/11/16   Tobey Grim, MD  amLODipine (NORVASC) 5 MG tablet Take 1 tablet (5 mg total) by mouth daily. 04/15/17   Regalado, Belkys A, MD  escitalopram (LEXAPRO) 5 MG tablet Take 1 tablet (5 mg total) by mouth daily. 04/23/17   Gordy Savers, MD  levothyroxine (SYNTHROID, LEVOTHROID) 100 MCG tablet Take 1 tablet (100 mcg total) by mouth daily. 04/28/16   Gordy Savers, MD    metoprolol tartrate (LOPRESSOR) 25 MG tablet Take 1 tablet (25 mg total) by mouth 2 (two) times daily. 04/28/16   Gordy Savers, MD  Multiple Vitamin (MULTIVITAMIN WITH MINERALS) TABS tablet Take 1 tablet by mouth daily.    [provider]  pantoprazole (PROTONIX) 40 MG tablet Take 1 tablet (40 mg total) by mouth 2 (two) times daily. 04/10/17   Zannie Cove, MD  sucralfate (CARAFATE) 1 GM/10ML suspension Take 10 mLs (1 g total) by mouth 4 (four) times daily -  with meals and at bedtime. 04/14/17   Regalado, Prentiss Bells, MD    Family History No family history on file.  Social History Social History  Substance Use Topics  . Smoking status: Never Smoker  . Smokeless tobacco: Never Used  . Alcohol use No     Allergies   Patient has no known allergies.   Review of Systems Review of Systems  Musculoskeletal: Positive for myalgias.  Skin: Positive for wound.  Neurological: Positive for headaches.  All other systems reviewed and are negative.    Physical Exam Updated Vital Signs BP (!) 161/73 (BP Location: Right Arm)   Pulse 89   Temp 98.1 F (36.7 C) (Oral)   SpO2 96%   Physical Exam  Constitutional: She is oriented to person, place, and time. She appears well-developed and well-nourished.  HENT:  Head: Normocephalic.  Laceration on the right side of the forehead with macerated skin edges.   Eyes: Pupils are equal, round, and reactive to light. EOM are normal.  Neck: No JVD present.  Cardiovascular: Normal rate, regular rhythm and normal heart sounds.   No murmur heard. Pulmonary/Chest: Effort normal and breath sounds normal. She has no wheezes. She has no rales. She exhibits tenderness.  Mild tenderness to the right lateral rib cage.  Abdominal: Soft. Bowel sounds are normal. She exhibits no distension and no mass. There is no tenderness.  Musculoskeletal: Normal range of motion. She exhibits no edema.  Lymphadenopathy:    She has no cervical adenopathy.   Neurological: She is alert and oriented to person, place, and time. No cranial nerve deficit. She exhibits normal muscle tone. Coordination normal.  Skin: Skin is warm and dry. No rash noted.  Psychiatric: She has a normal mood and affect. Her behavior is normal. Judgment and thought content normal.  Nursing note and vitals reviewed.    ED Treatments / Results  DIAGNOSTIC STUDIES: Oxygen Saturation is 96% on RA, normal by my interpretation.   COORDINATION OF CARE: 3:55 AM-Discussed next steps with pt including a CT scan. Pt verbalized understanding and is agreeable with the plan.   Radiology Ct Head Wo Contrast  Result Date: 04/25/2017 CLINICAL DATA:  Status post fall, with laceration on the forehead. Concern for head or cervical spine injury. Initial encounter. EXAM: CT HEAD WITHOUT CONTRAST CT CERVICAL SPINE WITHOUT CONTRAST TECHNIQUE: Multidetector CT imaging of the head and cervical spine was performed following  the standard protocol without intravenous contrast. Multiplanar CT image reconstructions of the cervical spine were also generated. COMPARISON:  CT of the head and cervical spine performed 06/05/2016 FINDINGS: CT HEAD FINDINGS Brain: No evidence of acute infarction, hemorrhage, hydrocephalus, extra-axial collection or mass lesion/mass effect. Prominence of the ventricles and sulci reflects moderate cortical volume loss. Cerebellar atrophy is noted. Diffuse periventricular and subcortical white matter change likely reflects small vessel ischemic microangiopathy. The brainstem and fourth ventricle are within normal limits. The basal ganglia are unremarkable in appearance. The cerebral hemispheres demonstrate grossly normal gray-white differentiation. No mass effect or midline shift is seen. Vascular: No hyperdense vessel or unexpected calcification. Skull: There appears to be a mildly displaced fracture involving both sides of the nasal bone. Sinuses/Orbits: The orbits are within  normal limits. The paranasal sinuses and mastoid air cells are well-aerated. Other: Soft tissue swelling is noted overlying the frontal calvarium. CT CERVICAL SPINE FINDINGS Alignment: There is relatively stable grade 2 anterolisthesis of C3 on C4, with underlying facet disease. Skull base and vertebrae: No acute fracture. No primary bone lesion or focal pathologic process. Soft tissues and spinal canal: No prevertebral fluid or swelling. No visible canal hematoma. Disc levels: Degenerative change is noted about the dens. Multilevel disc space narrowing is noted along the cervical and upper thoracic spine, with scattered anterior and posterior disc osteophyte complexes. Upper chest: The visualized lung apices are clear, aside from dense calcification. The thyroid gland is grossly unremarkable in appearance. Calcification is noted at the right carotid bifurcation. Other: No additional soft tissue abnormalities are seen. IMPRESSION: 1. No evidence of traumatic intracranial injury or fracture. 2. No evidence of acute fracture or subluxation along the cervical spine. 3. Soft tissue swelling overlying the frontal calvarium. 4. Moderate cortical volume loss and diffuse small vessel ischemic microangiopathy. 5. Degenerative change along the cervical and upper thoracic spine. 6. Dense calcification at the lung apices. 7. Calcification at the right carotid bifurcation. Carotid ultrasound would be helpful for further evaluation, when and as deemed clinically appropriate. Electronically Signed   By: Roanna Raider M.D.   On: 04/25/2017 04:38   Ct Cervical Spine Wo Contrast  Result Date: 04/25/2017 CLINICAL DATA:  Status post fall, with laceration on the forehead. Concern for head or cervical spine injury. Initial encounter. EXAM: CT HEAD WITHOUT CONTRAST CT CERVICAL SPINE WITHOUT CONTRAST TECHNIQUE: Multidetector CT imaging of the head and cervical spine was performed following the standard protocol without intravenous  contrast. Multiplanar CT image reconstructions of the cervical spine were also generated. COMPARISON:  CT of the head and cervical spine performed 06/05/2016 FINDINGS: CT HEAD FINDINGS Brain: No evidence of acute infarction, hemorrhage, hydrocephalus, extra-axial collection or mass lesion/mass effect. Prominence of the ventricles and sulci reflects moderate cortical volume loss. Cerebellar atrophy is noted. Diffuse periventricular and subcortical white matter change likely reflects small vessel ischemic microangiopathy. The brainstem and fourth ventricle are within normal limits. The basal ganglia are unremarkable in appearance. The cerebral hemispheres demonstrate grossly normal gray-white differentiation. No mass effect or midline shift is seen. Vascular: No hyperdense vessel or unexpected calcification. Skull: There appears to be a mildly displaced fracture involving both sides of the nasal bone. Sinuses/Orbits: The orbits are within normal limits. The paranasal sinuses and mastoid air cells are well-aerated. Other: Soft tissue swelling is noted overlying the frontal calvarium. CT CERVICAL SPINE FINDINGS Alignment: There is relatively stable grade 2 anterolisthesis of C3 on C4, with underlying facet disease. Skull base and vertebrae: No acute  fracture. No primary bone lesion or focal pathologic process. Soft tissues and spinal canal: No prevertebral fluid or swelling. No visible canal hematoma. Disc levels: Degenerative change is noted about the dens. Multilevel disc space narrowing is noted along the cervical and upper thoracic spine, with scattered anterior and posterior disc osteophyte complexes. Upper chest: The visualized lung apices are clear, aside from dense calcification. The thyroid gland is grossly unremarkable in appearance. Calcification is noted at the right carotid bifurcation. Other: No additional soft tissue abnormalities are seen. IMPRESSION: 1. No evidence of traumatic intracranial injury or  fracture. 2. No evidence of acute fracture or subluxation along the cervical spine. 3. Soft tissue swelling overlying the frontal calvarium. 4. Moderate cortical volume loss and diffuse small vessel ischemic microangiopathy. 5. Degenerative change along the cervical and upper thoracic spine. 6. Dense calcification at the lung apices. 7. Calcification at the right carotid bifurcation. Carotid ultrasound would be helpful for further evaluation, when and as deemed clinically appropriate. Electronically Signed   By: Roanna Raider M.D.   On: 04/25/2017 04:38    Procedures Procedures (including critical care time) LACERATION REPAIR Performed by: [email or web address] Authorized by: [email or web address] Consent: Verbal consent obtained. Risks and benefits: risks, benefits and alternatives were discussed Consent given by: patient Patient identity confirmed: provided demographic data Prepped and Draped in normal sterile fashion Wound explored  Laceration Location: Forehead   Laceration Length: 3 cm  No Foreign Bodies seen or palpated  Anesthesia: local infiltration  Local anesthetic: lidocaine 2% with epinephrine  Anesthetic total: 3 ml  Amount of cleaning: standard  Skin closure: close  Number of sutures: 6  Technique: simple interrupted  Patient tolerance: Patient tolerated the procedure well with no immediate complications.  Medications Ordered in ED Medications - No data to display   Initial Impression / Assessment and Plan / ED Course  I have reviewed the triage vital signs and the nursing notes.  Pertinent imaging results that were available during my care of the patient were reviewed by me and considered in my medical decision making (see chart for details).  Fall with forehead laceration. Old records are reviewed, showing prior ED visits for falls. She is sent for CT scan of head and cervical spine. Last tetanus immunization was in 2016.  Final Clinical Impressions(s) / ED Diagnoses    Final diagnoses:  Fall from slip, trip, or stumble, initial encounter  Forehead laceration, initial encounter    New Prescriptions New Prescriptions   No medications on file   I personally performed the services described in this documentation, which was scribed in my presence. The recorded information has been reviewed and is accurate.       Dione Booze, MD 04/25/17 [phone]

## 2017-05-01 DIAGNOSIS — S81801A Unspecified open wound, right lower leg, initial encounter: Secondary | ICD-10-CM | POA: Diagnosis not present

## 2017-05-01 DIAGNOSIS — S81801D Unspecified open wound, right lower leg, subsequent encounter: Secondary | ICD-10-CM | POA: Diagnosis not present

## 2017-05-08 ENCOUNTER — Other Ambulatory Visit: Payer: Self-pay | Admitting: *Deleted

## 2017-05-08 NOTE — Patient Outreach (Signed)
[facility]) Care Management  05/08/2017  [name] [birth year] [id number]   Met with Katherine Flynn, SW at facility. She reports patient discharged home yesterday. Patient went home with [facility] hour caregivers and [facility].  She reports that there was discussion with family around LTC memory care unit.  But since patient has 24 hour care, they took her home. THey have applied for Medicaid due to considering LTC.  Plan to sign off as patient has discharged from facility.   Royetta Crochet. Laymond Purser, RN, BSN, [place] [phone]) Business Cell  [phone]) Toll Free Office

## 2017-05-09 ENCOUNTER — Telehealth: Payer: Self-pay | Admitting: Internal Medicine

## 2017-05-09 DIAGNOSIS — J449 Chronic obstructive pulmonary disease, unspecified: Secondary | ICD-10-CM | POA: Diagnosis not present

## 2017-05-09 DIAGNOSIS — G309 Alzheimer's disease, unspecified: Secondary | ICD-10-CM | POA: Diagnosis not present

## 2017-05-09 DIAGNOSIS — I10 Essential (primary) hypertension: Secondary | ICD-10-CM | POA: Diagnosis not present

## 2017-05-09 DIAGNOSIS — F0280 Dementia in other diseases classified elsewhere without behavioral disturbance: Secondary | ICD-10-CM | POA: Diagnosis not present

## 2017-05-09 DIAGNOSIS — R261 Paralytic gait: Secondary | ICD-10-CM | POA: Diagnosis not present

## 2017-05-09 DIAGNOSIS — S81811D Laceration without foreign body, right lower leg, subsequent encounter: Secondary | ICD-10-CM | POA: Diagnosis not present

## 2017-05-09 DIAGNOSIS — M6281 Muscle weakness (generalized): Secondary | ICD-10-CM | POA: Diagnosis not present

## 2017-05-09 DIAGNOSIS — K280 Acute gastrojejunal ulcer with hemorrhage: Secondary | ICD-10-CM | POA: Diagnosis not present

## 2017-05-09 DIAGNOSIS — D62 Acute posthemorrhagic anemia: Secondary | ICD-10-CM | POA: Diagnosis not present

## 2017-05-09 NOTE — Telephone Encounter (Signed)
Junious Dresseronnie with [facility] would like verbal to proceed with skilled nursing, OT, PT, and home health aid evall  Also would like you to know pts bp today was 166/80, which is outside her parameters.

## 2017-05-10 ENCOUNTER — Telehealth: Payer: Self-pay | Admitting: Gastroenterology

## 2017-05-10 ENCOUNTER — Ambulatory Visit: Payer: Medicare HMO | Admitting: Gastroenterology

## 2017-05-10 DIAGNOSIS — S81811D Laceration without foreign body, right lower leg, subsequent encounter: Secondary | ICD-10-CM | POA: Diagnosis not present

## 2017-05-10 DIAGNOSIS — J449 Chronic obstructive pulmonary disease, unspecified: Secondary | ICD-10-CM | POA: Diagnosis not present

## 2017-05-10 DIAGNOSIS — G309 Alzheimer's disease, unspecified: Secondary | ICD-10-CM | POA: Diagnosis not present

## 2017-05-10 DIAGNOSIS — R261 Paralytic gait: Secondary | ICD-10-CM | POA: Diagnosis not present

## 2017-05-10 DIAGNOSIS — M6281 Muscle weakness (generalized): Secondary | ICD-10-CM | POA: Diagnosis not present

## 2017-05-10 DIAGNOSIS — I10 Essential (primary) hypertension: Secondary | ICD-10-CM | POA: Diagnosis not present

## 2017-05-10 DIAGNOSIS — D62 Acute posthemorrhagic anemia: Secondary | ICD-10-CM | POA: Diagnosis not present

## 2017-05-10 DIAGNOSIS — F0280 Dementia in other diseases classified elsewhere without behavioral disturbance: Secondary | ICD-10-CM | POA: Diagnosis not present

## 2017-05-10 DIAGNOSIS — K280 Acute gastrojejunal ulcer with hemorrhage: Secondary | ICD-10-CM | POA: Diagnosis not present

## 2017-05-11 ENCOUNTER — Telehealth: Payer: Self-pay

## 2017-05-11 DIAGNOSIS — K280 Acute gastrojejunal ulcer with hemorrhage: Secondary | ICD-10-CM | POA: Diagnosis not present

## 2017-05-11 DIAGNOSIS — S81811D Laceration without foreign body, right lower leg, subsequent encounter: Secondary | ICD-10-CM | POA: Diagnosis not present

## 2017-05-11 DIAGNOSIS — G309 Alzheimer's disease, unspecified: Secondary | ICD-10-CM | POA: Diagnosis not present

## 2017-05-11 DIAGNOSIS — D62 Acute posthemorrhagic anemia: Secondary | ICD-10-CM | POA: Diagnosis not present

## 2017-05-11 DIAGNOSIS — M6281 Muscle weakness (generalized): Secondary | ICD-10-CM | POA: Diagnosis not present

## 2017-05-11 DIAGNOSIS — I10 Essential (primary) hypertension: Secondary | ICD-10-CM | POA: Diagnosis not present

## 2017-05-11 DIAGNOSIS — F0280 Dementia in other diseases classified elsewhere without behavioral disturbance: Secondary | ICD-10-CM | POA: Diagnosis not present

## 2017-05-11 DIAGNOSIS — R261 Paralytic gait: Secondary | ICD-10-CM | POA: Diagnosis not present

## 2017-05-11 DIAGNOSIS — J449 Chronic obstructive pulmonary disease, unspecified: Secondary | ICD-10-CM | POA: Diagnosis not present

## 2017-05-11 NOTE — Telephone Encounter (Signed)
Okay for PT

## 2017-05-11 NOTE — Telephone Encounter (Signed)
Verbal orders were given to connie per Dr Kirtland BouchardK.

## 2017-05-11 NOTE — Telephone Encounter (Signed)
Spoke with [facility] and advised. Nothing further needed.

## 2017-05-11 NOTE — Telephone Encounter (Signed)
LMTCB for [facility]

## 2017-05-11 NOTE — Telephone Encounter (Signed)
Pete @ [facility] is calling for approval for PT orders for 2xwk/4wks   Dr. Kirtland BouchardK - Please advise. Thanks!

## 2017-05-16 DIAGNOSIS — M6281 Muscle weakness (generalized): Secondary | ICD-10-CM | POA: Diagnosis not present

## 2017-05-16 DIAGNOSIS — S81811D Laceration without foreign body, right lower leg, subsequent encounter: Secondary | ICD-10-CM | POA: Diagnosis not present

## 2017-05-16 DIAGNOSIS — F0280 Dementia in other diseases classified elsewhere without behavioral disturbance: Secondary | ICD-10-CM | POA: Diagnosis not present

## 2017-05-16 DIAGNOSIS — G309 Alzheimer's disease, unspecified: Secondary | ICD-10-CM | POA: Diagnosis not present

## 2017-05-16 DIAGNOSIS — R261 Paralytic gait: Secondary | ICD-10-CM | POA: Diagnosis not present

## 2017-05-16 DIAGNOSIS — K280 Acute gastrojejunal ulcer with hemorrhage: Secondary | ICD-10-CM | POA: Diagnosis not present

## 2017-05-16 DIAGNOSIS — D62 Acute posthemorrhagic anemia: Secondary | ICD-10-CM | POA: Diagnosis not present

## 2017-05-16 DIAGNOSIS — J449 Chronic obstructive pulmonary disease, unspecified: Secondary | ICD-10-CM | POA: Diagnosis not present

## 2017-05-16 DIAGNOSIS — I10 Essential (primary) hypertension: Secondary | ICD-10-CM | POA: Diagnosis not present

## 2017-05-17 ENCOUNTER — Encounter: Payer: Self-pay | Admitting: Internal Medicine

## 2017-05-17 ENCOUNTER — Ambulatory Visit (INDEPENDENT_AMBULATORY_CARE_PROVIDER_SITE_OTHER): Payer: Medicare HMO | Admitting: Internal Medicine

## 2017-05-17 VITALS — BP 142/62 | HR 77 | Temp 97.6°F

## 2017-05-17 DIAGNOSIS — D500 Iron deficiency anemia secondary to blood loss (chronic): Secondary | ICD-10-CM | POA: Diagnosis not present

## 2017-05-17 DIAGNOSIS — K250 Acute gastric ulcer with hemorrhage: Secondary | ICD-10-CM | POA: Diagnosis not present

## 2017-05-17 DIAGNOSIS — R261 Paralytic gait: Secondary | ICD-10-CM | POA: Diagnosis not present

## 2017-05-17 DIAGNOSIS — J449 Chronic obstructive pulmonary disease, unspecified: Secondary | ICD-10-CM | POA: Diagnosis not present

## 2017-05-17 DIAGNOSIS — M6281 Muscle weakness (generalized): Secondary | ICD-10-CM | POA: Diagnosis not present

## 2017-05-17 DIAGNOSIS — F0280 Dementia in other diseases classified elsewhere without behavioral disturbance: Secondary | ICD-10-CM | POA: Diagnosis not present

## 2017-05-17 DIAGNOSIS — I10 Essential (primary) hypertension: Secondary | ICD-10-CM | POA: Diagnosis not present

## 2017-05-17 DIAGNOSIS — D62 Acute posthemorrhagic anemia: Secondary | ICD-10-CM | POA: Diagnosis not present

## 2017-05-17 DIAGNOSIS — E440 Moderate protein-calorie malnutrition: Secondary | ICD-10-CM

## 2017-05-17 DIAGNOSIS — K280 Acute gastrojejunal ulcer with hemorrhage: Secondary | ICD-10-CM | POA: Diagnosis not present

## 2017-05-17 DIAGNOSIS — G309 Alzheimer's disease, unspecified: Secondary | ICD-10-CM | POA: Diagnosis not present

## 2017-05-17 DIAGNOSIS — S81811D Laceration without foreign body, right lower leg, subsequent encounter: Secondary | ICD-10-CM | POA: Diagnosis not present

## 2017-05-17 LAB — CBC WITH DIFFERENTIAL/PLATELET
BASOS PCT: 1 % (ref 0.0–3.0)
Basophils Absolute: 0.1 10*3/uL (ref 0.0–0.1)
EOS PCT: 6.7 % — AB (ref 0.0–5.0)
Eosinophils Absolute: 0.4 10*3/uL (ref 0.0–0.7)
HCT: 35.1 % — ABNORMAL LOW (ref 36.0–46.0)
Hemoglobin: 11.1 g/dL — ABNORMAL LOW (ref 12.0–15.0)
Lymphocytes Relative: 18.8 % (ref 12.0–46.0)
Lymphs Abs: 1.1 10*3/uL (ref 0.7–4.0)
MCHC: 31.7 g/dL (ref 30.0–36.0)
MCV: 83.4 fl (ref 78.0–100.0)
MONO ABS: 0.5 10*3/uL (ref 0.1–1.0)
Monocytes Relative: 8.6 % (ref 3.0–12.0)
NEUTROS PCT: 64.9 % (ref 43.0–77.0)
Neutro Abs: 3.8 10*3/uL (ref 1.4–7.7)
Platelets: 383 10*3/uL (ref 150.0–400.0)
RBC: 4.21 Mil/uL (ref 3.87–5.11)
RDW: 20.6 % — AB (ref 11.5–15.5)
WBC: 5.8 10*3/uL (ref 4.0–10.5)

## 2017-05-17 MED ORDER — ALBUTEROL SULFATE HFA 108 (90 BASE) MCG/ACT IN AERS: 2.0000 | INHALATION_SPRAY | Freq: Four times a day (QID) | RESPIRATORY_TRACT | 0 refills | Status: AC | PRN

## 2017-05-17 NOTE — Patient Instructions (Signed)
Follow-up with gastroenterology as scheduled  Okay to discontinue Lexapro( escitalopram)  Return in 3 months for follow-up

## 2017-05-17 NOTE — Progress Notes (Signed)
Subjective:    Patient ID: Katherine Flynn, female    DOB: 26-Mar-1923, 83 y.o.   MRN: [id number]  HPI 83 year old patient who is seen today in follow-up.  She was hospitalized recently for upper GI bleeding secondary to a gastro-jejunal anastomotic ulcer.  She was seen 1 month ago after just returning [facility].  She was anorexic and seem to be a bit depressed.  Lexapro 5 mg was prescribed but she has not been taking. Over the past month, she continues to improve with improved since of well being and much better appetite.  She feels that she has progressed nicely over the past few weeks.  She remains weak, but still receiving some active physical therapy Her caregiver gives a history of dark black stool over the past few days.  Past Medical History:  Diagnosis Date  . Chronic pain syndrome   . COPD (chronic obstructive pulmonary disease) (HCC)   . GERD (gastroesophageal reflux disease)   . History of pyloric stenosis   . Hypertension   . Macular degeneration   . Osteoarthritis   . Thyroid disease   . Weight loss      Social History   Social History  . Marital status: Widowed    Spouse name: N/A  . Number of children: N/A  . Years of education: N/A   Occupational History  . Not on file.   Social History Main Topics  . Smoking status: Never Smoker  . Smokeless tobacco: Never Used  . Alcohol use No  . Drug use: No  . Sexual activity: Not on file   Other Topics Concern  . Not on file   Social History Narrative  . No narrative on file    Past Surgical History:  Procedure Laterality Date  . APPENDECTOMY    . CATARACT EXTRACTION    . ESOPHAGOGASTRODUODENOSCOPY (EGD) WITH PROPOFOL N/A 04/07/2017   Procedure: ESOPHAGOGASTRODUODENOSCOPY (EGD) WITH PROPOFOL;  Surgeon: Sherrilyn Ristanis, Henry L III, MD;  Location: [facility];  Service: Gastroenterology;  Laterality: N/A;  . ESOPHAGOGASTRODUODENOSCOPY (EGD) WITH PROPOFOL N/A  04/11/2017   Procedure: ESOPHAGOGASTRODUODENOSCOPY (EGD) WITH PROPOFOL;  Surgeon: Napoleon FormNandigam, Kavitha V, MD;  Location: [facility];  Service: Endoscopy;  Laterality: N/A;  . JOINT REPLACEMENT    . nissan fundoplication  08/2007  . ORIF HIP FRACTURE    . TUBAL LIGATION      No family history on file.  No Known Allergies  Current Outpatient Prescriptions on File Prior to Visit  Medication Sig Dispense Refill  . amLODipine (NORVASC) 5 MG tablet Take 1 tablet (5 mg total) by mouth daily. 30 tablet 0  . collagenase (SANTYL) ointment Apply 1 application topically daily.    Marland Kitchen. levothyroxine (SYNTHROID, LEVOTHROID) 100 MCG tablet Take 1 tablet (100 mcg total) by mouth daily. 90 tablet 4  . metoprolol tartrate (LOPRESSOR) 25 MG tablet Take 1 tablet (25 mg total) by mouth 2 (two) times daily. 180 tablet 4  . Multiple Vitamin (MULTIVITAMIN WITH MINERALS) TABS tablet Take 1 tablet by mouth daily.    . pantoprazole (PROTONIX) 40 MG tablet Take 1 tablet (40 mg total) by mouth 2 (two) times daily. 60 tablet 0  . PRESCRIPTION MEDICATION Take 240 mLs by mouth 2 (two) times daily. MedPass    . sucralfate (CARAFATE) 1 GM/10ML suspension Take 10 mLs (1 g total) by mouth 4 (four) times daily -  with meals and at bedtime. 420 mL 0   No current facility-administered  medications on file prior to visit.     BP (!) 142/62 (BP Location: Left Arm, Patient Position: Sitting, Cuff Size: Normal)   Pulse 77   Temp 97.6 F (36.4 C) (Oral)   SpO2 96%      Review of Systems  Constitutional: Positive for fatigue.  HENT: Negative for congestion, dental problem, hearing loss, rhinorrhea, sinus pressure, sore throat and tinnitus.   Eyes: Negative for pain, discharge and visual disturbance.  Respiratory: Negative for cough and shortness of breath.   Cardiovascular: Negative for chest pain, palpitations and leg swelling.  Gastrointestinal: Negative for abdominal distention, abdominal pain, blood in stool, constipation,  diarrhea, nausea and vomiting.       History of melena  Genitourinary: Negative for difficulty urinating, dysuria, flank pain, frequency, hematuria, pelvic pain, urgency, vaginal bleeding, vaginal discharge and vaginal pain.  Musculoskeletal: Negative for arthralgias, gait problem and joint swelling.  Skin: Negative for rash.  Neurological: Positive for weakness. Negative for dizziness, syncope, speech difficulty, numbness and headaches.  Hematological: Negative for adenopathy.  Psychiatric/Behavioral: Negative for agitation, behavioral problems and dysphoric mood. The patient is not nervous/anxious.        Objective:   Physical Exam  Constitutional: She is oriented to person, place, and time. She appears well-developed and well-nourished.   Week, Frail Blood pressure low normal  HENT:  Head: Normocephalic.  Right Ear: External ear normal.  Left Ear: External ear normal.  Mouth/Throat: Oropharynx is clear and moist.  Eyes: Pupils are equal, round, and reactive to light. Conjunctivae and EOM are normal.  Neck: Normal range of motion. Neck supple. No thyromegaly present.  Cardiovascular: Normal rate, regular rhythm, normal heart sounds and intact distal pulses.   Pulmonary/Chest: Effort normal and breath sounds normal.  Abdominal: Soft. Bowel sounds are normal. She exhibits no mass. There is no tenderness.  Genitourinary:  Genitourinary Comments: Stool is dark brown and hematest negative  Lymphadenopathy:    She has no cervical adenopathy.  Neurological: She is alert and oriented to person, place, and time.  Skin: Skin is warm and dry. No rash noted.  Psychiatric: She has a normal mood and affect. Her behavior is normal.          Assessment & Plan:   Peptic ulcer disease, status post Billroth II gastrojejunostomy with history of anastomotic ulcer and GI bleeding.  History of possible melena over the past 3 days.  Rectal exam was unremarkable with heme-negative stool.  Will  check CBC.  Follow-up GI as scheduled General debility.  Continues to improve Anorexia.  Appetite improved History depression.  Patient has not started SSRI therapy.  We'll continue to observe off medication  Follow-up 3 months  Rogelia Boga

## 2017-05-18 ENCOUNTER — Telehealth: Payer: Self-pay

## 2017-05-18 DIAGNOSIS — K280 Acute gastrojejunal ulcer with hemorrhage: Secondary | ICD-10-CM | POA: Diagnosis not present

## 2017-05-18 DIAGNOSIS — D62 Acute posthemorrhagic anemia: Secondary | ICD-10-CM | POA: Diagnosis not present

## 2017-05-18 DIAGNOSIS — F0280 Dementia in other diseases classified elsewhere without behavioral disturbance: Secondary | ICD-10-CM | POA: Diagnosis not present

## 2017-05-18 DIAGNOSIS — I10 Essential (primary) hypertension: Secondary | ICD-10-CM | POA: Diagnosis not present

## 2017-05-18 DIAGNOSIS — J449 Chronic obstructive pulmonary disease, unspecified: Secondary | ICD-10-CM | POA: Diagnosis not present

## 2017-05-18 DIAGNOSIS — R261 Paralytic gait: Secondary | ICD-10-CM | POA: Diagnosis not present

## 2017-05-18 DIAGNOSIS — M6281 Muscle weakness (generalized): Secondary | ICD-10-CM | POA: Diagnosis not present

## 2017-05-18 DIAGNOSIS — G309 Alzheimer's disease, unspecified: Secondary | ICD-10-CM | POA: Diagnosis not present

## 2017-05-18 DIAGNOSIS — S81811D Laceration without foreign body, right lower leg, subsequent encounter: Secondary | ICD-10-CM | POA: Diagnosis not present

## 2017-05-18 NOTE — Telephone Encounter (Signed)
Pete @ [facility] is at pt's home for visit. He states that she reports she fell yesterday in her home. She was looking out the front door and when she went to turn she lost her balance and fell. She denies any injuries or hitting her head/LOC. Cindee Lameete has assessed pt and states that she appears well. He just wanted to advise you.  Dr. Kirtland BouchardK - Lorain ChildesFYI. Thanks!

## 2017-05-21 DIAGNOSIS — G309 Alzheimer's disease, unspecified: Secondary | ICD-10-CM | POA: Diagnosis not present

## 2017-05-21 DIAGNOSIS — K280 Acute gastrojejunal ulcer with hemorrhage: Secondary | ICD-10-CM | POA: Diagnosis not present

## 2017-05-21 DIAGNOSIS — F0280 Dementia in other diseases classified elsewhere without behavioral disturbance: Secondary | ICD-10-CM | POA: Diagnosis not present

## 2017-05-21 DIAGNOSIS — M6281 Muscle weakness (generalized): Secondary | ICD-10-CM | POA: Diagnosis not present

## 2017-05-21 DIAGNOSIS — R261 Paralytic gait: Secondary | ICD-10-CM | POA: Diagnosis not present

## 2017-05-21 DIAGNOSIS — S81811D Laceration without foreign body, right lower leg, subsequent encounter: Secondary | ICD-10-CM | POA: Diagnosis not present

## 2017-05-21 DIAGNOSIS — I10 Essential (primary) hypertension: Secondary | ICD-10-CM | POA: Diagnosis not present

## 2017-05-21 DIAGNOSIS — J449 Chronic obstructive pulmonary disease, unspecified: Secondary | ICD-10-CM | POA: Diagnosis not present

## 2017-05-21 DIAGNOSIS — D62 Acute posthemorrhagic anemia: Secondary | ICD-10-CM | POA: Diagnosis not present

## 2017-05-22 DIAGNOSIS — M6281 Muscle weakness (generalized): Secondary | ICD-10-CM | POA: Diagnosis not present

## 2017-05-22 DIAGNOSIS — D62 Acute posthemorrhagic anemia: Secondary | ICD-10-CM | POA: Diagnosis not present

## 2017-05-22 DIAGNOSIS — I10 Essential (primary) hypertension: Secondary | ICD-10-CM | POA: Diagnosis not present

## 2017-05-22 DIAGNOSIS — S81811D Laceration without foreign body, right lower leg, subsequent encounter: Secondary | ICD-10-CM | POA: Diagnosis not present

## 2017-05-22 DIAGNOSIS — F0280 Dementia in other diseases classified elsewhere without behavioral disturbance: Secondary | ICD-10-CM | POA: Diagnosis not present

## 2017-05-22 DIAGNOSIS — J449 Chronic obstructive pulmonary disease, unspecified: Secondary | ICD-10-CM | POA: Diagnosis not present

## 2017-05-22 DIAGNOSIS — G309 Alzheimer's disease, unspecified: Secondary | ICD-10-CM | POA: Diagnosis not present

## 2017-05-22 DIAGNOSIS — R261 Paralytic gait: Secondary | ICD-10-CM | POA: Diagnosis not present

## 2017-05-22 DIAGNOSIS — K280 Acute gastrojejunal ulcer with hemorrhage: Secondary | ICD-10-CM | POA: Diagnosis not present

## 2017-05-23 DIAGNOSIS — R261 Paralytic gait: Secondary | ICD-10-CM | POA: Diagnosis not present

## 2017-05-23 DIAGNOSIS — K280 Acute gastrojejunal ulcer with hemorrhage: Secondary | ICD-10-CM | POA: Diagnosis not present

## 2017-05-23 DIAGNOSIS — F0280 Dementia in other diseases classified elsewhere without behavioral disturbance: Secondary | ICD-10-CM | POA: Diagnosis not present

## 2017-05-23 DIAGNOSIS — D62 Acute posthemorrhagic anemia: Secondary | ICD-10-CM | POA: Diagnosis not present

## 2017-05-23 DIAGNOSIS — S81811D Laceration without foreign body, right lower leg, subsequent encounter: Secondary | ICD-10-CM | POA: Diagnosis not present

## 2017-05-23 DIAGNOSIS — G309 Alzheimer's disease, unspecified: Secondary | ICD-10-CM | POA: Diagnosis not present

## 2017-05-23 DIAGNOSIS — J449 Chronic obstructive pulmonary disease, unspecified: Secondary | ICD-10-CM | POA: Diagnosis not present

## 2017-05-23 DIAGNOSIS — M6281 Muscle weakness (generalized): Secondary | ICD-10-CM | POA: Diagnosis not present

## 2017-05-23 DIAGNOSIS — I10 Essential (primary) hypertension: Secondary | ICD-10-CM | POA: Diagnosis not present

## 2017-05-24 DIAGNOSIS — S81811D Laceration without foreign body, right lower leg, subsequent encounter: Secondary | ICD-10-CM | POA: Diagnosis not present

## 2017-05-24 DIAGNOSIS — K280 Acute gastrojejunal ulcer with hemorrhage: Secondary | ICD-10-CM | POA: Diagnosis not present

## 2017-05-24 DIAGNOSIS — J449 Chronic obstructive pulmonary disease, unspecified: Secondary | ICD-10-CM | POA: Diagnosis not present

## 2017-05-24 DIAGNOSIS — I10 Essential (primary) hypertension: Secondary | ICD-10-CM | POA: Diagnosis not present

## 2017-05-24 DIAGNOSIS — G309 Alzheimer's disease, unspecified: Secondary | ICD-10-CM | POA: Diagnosis not present

## 2017-05-24 DIAGNOSIS — F0280 Dementia in other diseases classified elsewhere without behavioral disturbance: Secondary | ICD-10-CM | POA: Diagnosis not present

## 2017-05-24 DIAGNOSIS — R261 Paralytic gait: Secondary | ICD-10-CM | POA: Diagnosis not present

## 2017-05-24 DIAGNOSIS — M6281 Muscle weakness (generalized): Secondary | ICD-10-CM | POA: Diagnosis not present

## 2017-05-24 DIAGNOSIS — D62 Acute posthemorrhagic anemia: Secondary | ICD-10-CM | POA: Diagnosis not present

## 2017-05-25 DIAGNOSIS — K280 Acute gastrojejunal ulcer with hemorrhage: Secondary | ICD-10-CM | POA: Diagnosis not present

## 2017-05-25 DIAGNOSIS — D62 Acute posthemorrhagic anemia: Secondary | ICD-10-CM | POA: Diagnosis not present

## 2017-05-25 DIAGNOSIS — J449 Chronic obstructive pulmonary disease, unspecified: Secondary | ICD-10-CM | POA: Diagnosis not present

## 2017-05-25 DIAGNOSIS — G309 Alzheimer's disease, unspecified: Secondary | ICD-10-CM | POA: Diagnosis not present

## 2017-05-25 DIAGNOSIS — S81811D Laceration without foreign body, right lower leg, subsequent encounter: Secondary | ICD-10-CM | POA: Diagnosis not present

## 2017-05-25 DIAGNOSIS — F0280 Dementia in other diseases classified elsewhere without behavioral disturbance: Secondary | ICD-10-CM | POA: Diagnosis not present

## 2017-05-25 DIAGNOSIS — R261 Paralytic gait: Secondary | ICD-10-CM | POA: Diagnosis not present

## 2017-05-25 DIAGNOSIS — M6281 Muscle weakness (generalized): Secondary | ICD-10-CM | POA: Diagnosis not present

## 2017-05-25 DIAGNOSIS — I10 Essential (primary) hypertension: Secondary | ICD-10-CM | POA: Diagnosis not present

## 2017-05-29 DIAGNOSIS — M6281 Muscle weakness (generalized): Secondary | ICD-10-CM | POA: Diagnosis not present

## 2017-05-29 DIAGNOSIS — D62 Acute posthemorrhagic anemia: Secondary | ICD-10-CM | POA: Diagnosis not present

## 2017-05-29 DIAGNOSIS — J449 Chronic obstructive pulmonary disease, unspecified: Secondary | ICD-10-CM | POA: Diagnosis not present

## 2017-05-29 DIAGNOSIS — F0280 Dementia in other diseases classified elsewhere without behavioral disturbance: Secondary | ICD-10-CM | POA: Diagnosis not present

## 2017-05-29 DIAGNOSIS — S81811D Laceration without foreign body, right lower leg, subsequent encounter: Secondary | ICD-10-CM | POA: Diagnosis not present

## 2017-05-29 DIAGNOSIS — R261 Paralytic gait: Secondary | ICD-10-CM | POA: Diagnosis not present

## 2017-05-29 DIAGNOSIS — G309 Alzheimer's disease, unspecified: Secondary | ICD-10-CM | POA: Diagnosis not present

## 2017-05-29 DIAGNOSIS — I10 Essential (primary) hypertension: Secondary | ICD-10-CM | POA: Diagnosis not present

## 2017-05-29 DIAGNOSIS — K280 Acute gastrojejunal ulcer with hemorrhage: Secondary | ICD-10-CM | POA: Diagnosis not present

## 2017-05-30 DIAGNOSIS — M6281 Muscle weakness (generalized): Secondary | ICD-10-CM | POA: Diagnosis not present

## 2017-05-30 DIAGNOSIS — K280 Acute gastrojejunal ulcer with hemorrhage: Secondary | ICD-10-CM | POA: Diagnosis not present

## 2017-05-30 DIAGNOSIS — J449 Chronic obstructive pulmonary disease, unspecified: Secondary | ICD-10-CM | POA: Diagnosis not present

## 2017-05-30 DIAGNOSIS — F0280 Dementia in other diseases classified elsewhere without behavioral disturbance: Secondary | ICD-10-CM | POA: Diagnosis not present

## 2017-05-30 DIAGNOSIS — D62 Acute posthemorrhagic anemia: Secondary | ICD-10-CM | POA: Diagnosis not present

## 2017-05-30 DIAGNOSIS — G309 Alzheimer's disease, unspecified: Secondary | ICD-10-CM | POA: Diagnosis not present

## 2017-05-30 DIAGNOSIS — I10 Essential (primary) hypertension: Secondary | ICD-10-CM | POA: Diagnosis not present

## 2017-05-30 DIAGNOSIS — R261 Paralytic gait: Secondary | ICD-10-CM | POA: Diagnosis not present

## 2017-05-30 DIAGNOSIS — S81811D Laceration without foreign body, right lower leg, subsequent encounter: Secondary | ICD-10-CM | POA: Diagnosis not present

## 2017-05-31 DIAGNOSIS — K280 Acute gastrojejunal ulcer with hemorrhage: Secondary | ICD-10-CM | POA: Diagnosis not present

## 2017-05-31 DIAGNOSIS — G309 Alzheimer's disease, unspecified: Secondary | ICD-10-CM | POA: Diagnosis not present

## 2017-05-31 DIAGNOSIS — S81811D Laceration without foreign body, right lower leg, subsequent encounter: Secondary | ICD-10-CM | POA: Diagnosis not present

## 2017-05-31 DIAGNOSIS — D62 Acute posthemorrhagic anemia: Secondary | ICD-10-CM | POA: Diagnosis not present

## 2017-05-31 DIAGNOSIS — J449 Chronic obstructive pulmonary disease, unspecified: Secondary | ICD-10-CM | POA: Diagnosis not present

## 2017-05-31 DIAGNOSIS — M6281 Muscle weakness (generalized): Secondary | ICD-10-CM | POA: Diagnosis not present

## 2017-05-31 DIAGNOSIS — F0280 Dementia in other diseases classified elsewhere without behavioral disturbance: Secondary | ICD-10-CM | POA: Diagnosis not present

## 2017-05-31 DIAGNOSIS — I10 Essential (primary) hypertension: Secondary | ICD-10-CM | POA: Diagnosis not present

## 2017-05-31 DIAGNOSIS — R261 Paralytic gait: Secondary | ICD-10-CM | POA: Diagnosis not present

## 2017-06-01 DIAGNOSIS — R261 Paralytic gait: Secondary | ICD-10-CM | POA: Diagnosis not present

## 2017-06-01 DIAGNOSIS — J449 Chronic obstructive pulmonary disease, unspecified: Secondary | ICD-10-CM | POA: Diagnosis not present

## 2017-06-01 DIAGNOSIS — M6281 Muscle weakness (generalized): Secondary | ICD-10-CM | POA: Diagnosis not present

## 2017-06-01 DIAGNOSIS — D62 Acute posthemorrhagic anemia: Secondary | ICD-10-CM | POA: Diagnosis not present

## 2017-06-01 DIAGNOSIS — F0280 Dementia in other diseases classified elsewhere without behavioral disturbance: Secondary | ICD-10-CM | POA: Diagnosis not present

## 2017-06-01 DIAGNOSIS — S81811D Laceration without foreign body, right lower leg, subsequent encounter: Secondary | ICD-10-CM | POA: Diagnosis not present

## 2017-06-01 DIAGNOSIS — K280 Acute gastrojejunal ulcer with hemorrhage: Secondary | ICD-10-CM | POA: Diagnosis not present

## 2017-06-01 DIAGNOSIS — G309 Alzheimer's disease, unspecified: Secondary | ICD-10-CM | POA: Diagnosis not present

## 2017-06-01 DIAGNOSIS — I10 Essential (primary) hypertension: Secondary | ICD-10-CM | POA: Diagnosis not present

## 2017-06-04 DIAGNOSIS — D62 Acute posthemorrhagic anemia: Secondary | ICD-10-CM | POA: Diagnosis not present

## 2017-06-04 DIAGNOSIS — I10 Essential (primary) hypertension: Secondary | ICD-10-CM | POA: Diagnosis not present

## 2017-06-04 DIAGNOSIS — K280 Acute gastrojejunal ulcer with hemorrhage: Secondary | ICD-10-CM | POA: Diagnosis not present

## 2017-06-04 DIAGNOSIS — F0280 Dementia in other diseases classified elsewhere without behavioral disturbance: Secondary | ICD-10-CM | POA: Diagnosis not present

## 2017-06-04 DIAGNOSIS — S81811D Laceration without foreign body, right lower leg, subsequent encounter: Secondary | ICD-10-CM | POA: Diagnosis not present

## 2017-06-04 DIAGNOSIS — R261 Paralytic gait: Secondary | ICD-10-CM | POA: Diagnosis not present

## 2017-06-04 DIAGNOSIS — M6281 Muscle weakness (generalized): Secondary | ICD-10-CM | POA: Diagnosis not present

## 2017-06-04 DIAGNOSIS — G309 Alzheimer's disease, unspecified: Secondary | ICD-10-CM | POA: Diagnosis not present

## 2017-06-04 DIAGNOSIS — J449 Chronic obstructive pulmonary disease, unspecified: Secondary | ICD-10-CM | POA: Diagnosis not present

## 2017-06-05 DIAGNOSIS — S81811D Laceration without foreign body, right lower leg, subsequent encounter: Secondary | ICD-10-CM | POA: Diagnosis not present

## 2017-06-05 DIAGNOSIS — M6281 Muscle weakness (generalized): Secondary | ICD-10-CM | POA: Diagnosis not present

## 2017-06-05 DIAGNOSIS — I10 Essential (primary) hypertension: Secondary | ICD-10-CM | POA: Diagnosis not present

## 2017-06-05 DIAGNOSIS — F0280 Dementia in other diseases classified elsewhere without behavioral disturbance: Secondary | ICD-10-CM | POA: Diagnosis not present

## 2017-06-05 DIAGNOSIS — R261 Paralytic gait: Secondary | ICD-10-CM | POA: Diagnosis not present

## 2017-06-05 DIAGNOSIS — K280 Acute gastrojejunal ulcer with hemorrhage: Secondary | ICD-10-CM | POA: Diagnosis not present

## 2017-06-05 DIAGNOSIS — D62 Acute posthemorrhagic anemia: Secondary | ICD-10-CM | POA: Diagnosis not present

## 2017-06-05 DIAGNOSIS — G309 Alzheimer's disease, unspecified: Secondary | ICD-10-CM | POA: Diagnosis not present

## 2017-06-05 DIAGNOSIS — J449 Chronic obstructive pulmonary disease, unspecified: Secondary | ICD-10-CM | POA: Diagnosis not present

## 2017-06-06 DIAGNOSIS — J449 Chronic obstructive pulmonary disease, unspecified: Secondary | ICD-10-CM | POA: Diagnosis not present

## 2017-06-06 DIAGNOSIS — D62 Acute posthemorrhagic anemia: Secondary | ICD-10-CM | POA: Diagnosis not present

## 2017-06-06 DIAGNOSIS — G309 Alzheimer's disease, unspecified: Secondary | ICD-10-CM | POA: Diagnosis not present

## 2017-06-06 DIAGNOSIS — I10 Essential (primary) hypertension: Secondary | ICD-10-CM | POA: Diagnosis not present

## 2017-06-06 DIAGNOSIS — F0280 Dementia in other diseases classified elsewhere without behavioral disturbance: Secondary | ICD-10-CM | POA: Diagnosis not present

## 2017-06-06 DIAGNOSIS — S81811D Laceration without foreign body, right lower leg, subsequent encounter: Secondary | ICD-10-CM | POA: Diagnosis not present

## 2017-06-06 DIAGNOSIS — M6281 Muscle weakness (generalized): Secondary | ICD-10-CM | POA: Diagnosis not present

## 2017-06-06 DIAGNOSIS — R261 Paralytic gait: Secondary | ICD-10-CM | POA: Diagnosis not present

## 2017-06-06 DIAGNOSIS — K280 Acute gastrojejunal ulcer with hemorrhage: Secondary | ICD-10-CM | POA: Diagnosis not present

## 2017-06-07 ENCOUNTER — Telehealth: Payer: Self-pay

## 2017-06-07 DIAGNOSIS — R261 Paralytic gait: Secondary | ICD-10-CM | POA: Diagnosis not present

## 2017-06-07 DIAGNOSIS — K280 Acute gastrojejunal ulcer with hemorrhage: Secondary | ICD-10-CM | POA: Diagnosis not present

## 2017-06-07 DIAGNOSIS — J449 Chronic obstructive pulmonary disease, unspecified: Secondary | ICD-10-CM | POA: Diagnosis not present

## 2017-06-07 DIAGNOSIS — D62 Acute posthemorrhagic anemia: Secondary | ICD-10-CM | POA: Diagnosis not present

## 2017-06-07 DIAGNOSIS — F0280 Dementia in other diseases classified elsewhere without behavioral disturbance: Secondary | ICD-10-CM | POA: Diagnosis not present

## 2017-06-07 DIAGNOSIS — I10 Essential (primary) hypertension: Secondary | ICD-10-CM | POA: Diagnosis not present

## 2017-06-07 DIAGNOSIS — M6281 Muscle weakness (generalized): Secondary | ICD-10-CM | POA: Diagnosis not present

## 2017-06-07 DIAGNOSIS — S81811D Laceration without foreign body, right lower leg, subsequent encounter: Secondary | ICD-10-CM | POA: Diagnosis not present

## 2017-06-07 DIAGNOSIS — G309 Alzheimer's disease, unspecified: Secondary | ICD-10-CM | POA: Diagnosis not present

## 2017-06-07 NOTE — Telephone Encounter (Signed)
Katherine Flynn calling back to request additional order for ST for swallowing, due to coughing after eating and drinking (new this week)

## 2017-06-07 NOTE — Telephone Encounter (Signed)
[name] @ [facility] is asking for additional OT for 2x/4wks due to new onset left sided weakness, difficulty lifting objects and problems walking - started this past weekend. Per [name], son is going to call to have pt eval'd for possible stroke.  Dr. Kirtland Bouchard - Please advise. Thanks!

## 2017-06-08 ENCOUNTER — Telehealth: Payer: Self-pay

## 2017-06-08 ENCOUNTER — Other Ambulatory Visit: Payer: Self-pay | Admitting: Internal Medicine

## 2017-06-08 DIAGNOSIS — D62 Acute posthemorrhagic anemia: Secondary | ICD-10-CM | POA: Diagnosis not present

## 2017-06-08 DIAGNOSIS — S81811D Laceration without foreign body, right lower leg, subsequent encounter: Secondary | ICD-10-CM | POA: Diagnosis not present

## 2017-06-08 DIAGNOSIS — F0280 Dementia in other diseases classified elsewhere without behavioral disturbance: Secondary | ICD-10-CM | POA: Diagnosis not present

## 2017-06-08 DIAGNOSIS — I10 Essential (primary) hypertension: Secondary | ICD-10-CM | POA: Diagnosis not present

## 2017-06-08 DIAGNOSIS — J449 Chronic obstructive pulmonary disease, unspecified: Secondary | ICD-10-CM | POA: Diagnosis not present

## 2017-06-08 DIAGNOSIS — R261 Paralytic gait: Secondary | ICD-10-CM | POA: Diagnosis not present

## 2017-06-08 DIAGNOSIS — M6281 Muscle weakness (generalized): Secondary | ICD-10-CM | POA: Diagnosis not present

## 2017-06-08 DIAGNOSIS — G309 Alzheimer's disease, unspecified: Secondary | ICD-10-CM | POA: Diagnosis not present

## 2017-06-08 DIAGNOSIS — K280 Acute gastrojejunal ulcer with hemorrhage: Secondary | ICD-10-CM | POA: Diagnosis not present

## 2017-06-08 NOTE — Telephone Encounter (Signed)
Lanora Manis @ [facility] is calling to report that pt's caregiver has called their office this morning to advise that pt has had diarrhea all week and has not been eating well. She has become very weak. They have been giving her fluids and toast. The caregiver has asked for a nurse visit from [facility] to assess patient.   Dr. Kirtland Bouchard - [facility] is asking for an order for a PRN Nurse visit to assess pt. Please advise. Thanks!

## 2017-06-08 NOTE — Telephone Encounter (Signed)
All okay 

## 2017-06-08 NOTE — Telephone Encounter (Signed)
Spoke with Kandee Keen and advised. Nothing further needed at this time.

## 2017-06-08 NOTE — Telephone Encounter (Signed)
LMTCB

## 2017-06-08 NOTE — Telephone Encounter (Signed)
Okay for referral?

## 2017-06-10 ENCOUNTER — Emergency Department (HOSPITAL_COMMUNITY)
Admission: EM | Admit: 2017-06-10 | Discharge: 2017-06-10 | Disposition: A | Discharge status: Home / Self Care | Payer: Medicare HMO | Attending: Emergency Medicine | Admitting: Emergency Medicine

## 2017-06-10 ENCOUNTER — Emergency Department (HOSPITAL_COMMUNITY): Payer: Medicare HMO

## 2017-06-10 ENCOUNTER — Encounter (HOSPITAL_COMMUNITY): Payer: Self-pay | Admitting: Emergency Medicine

## 2017-06-10 DIAGNOSIS — I10 Essential (primary) hypertension: Secondary | ICD-10-CM | POA: Insufficient documentation

## 2017-06-10 DIAGNOSIS — R109 Unspecified abdominal pain: Secondary | ICD-10-CM | POA: Diagnosis not present

## 2017-06-10 DIAGNOSIS — D62 Acute posthemorrhagic anemia: Secondary | ICD-10-CM | POA: Diagnosis not present

## 2017-06-10 DIAGNOSIS — S81811D Laceration without foreign body, right lower leg, subsequent encounter: Secondary | ICD-10-CM | POA: Diagnosis not present

## 2017-06-10 DIAGNOSIS — M6281 Muscle weakness (generalized): Secondary | ICD-10-CM | POA: Diagnosis not present

## 2017-06-10 DIAGNOSIS — Z79899 Other long term (current) drug therapy: Secondary | ICD-10-CM | POA: Diagnosis not present

## 2017-06-10 DIAGNOSIS — F0280 Dementia in other diseases classified elsewhere without behavioral disturbance: Secondary | ICD-10-CM | POA: Diagnosis not present

## 2017-06-10 DIAGNOSIS — R197 Diarrhea, unspecified: Secondary | ICD-10-CM | POA: Insufficient documentation

## 2017-06-10 DIAGNOSIS — J449 Chronic obstructive pulmonary disease, unspecified: Secondary | ICD-10-CM | POA: Diagnosis not present

## 2017-06-10 DIAGNOSIS — K279 Peptic ulcer, site unspecified, unspecified as acute or chronic, without hemorrhage or perforation: Secondary | ICD-10-CM | POA: Diagnosis not present

## 2017-06-10 DIAGNOSIS — K253 Acute gastric ulcer without hemorrhage or perforation: Secondary | ICD-10-CM

## 2017-06-10 DIAGNOSIS — G8929 Other chronic pain: Secondary | ICD-10-CM | POA: Insufficient documentation

## 2017-06-10 DIAGNOSIS — G309 Alzheimer's disease, unspecified: Secondary | ICD-10-CM | POA: Diagnosis not present

## 2017-06-10 DIAGNOSIS — K280 Acute gastrojejunal ulcer with hemorrhage: Secondary | ICD-10-CM | POA: Diagnosis not present

## 2017-06-10 DIAGNOSIS — R261 Paralytic gait: Secondary | ICD-10-CM | POA: Diagnosis not present

## 2017-06-10 DIAGNOSIS — E039 Hypothyroidism, unspecified: Secondary | ICD-10-CM | POA: Diagnosis not present

## 2017-06-10 DIAGNOSIS — R1084 Generalized abdominal pain: Secondary | ICD-10-CM | POA: Diagnosis not present

## 2017-06-10 DIAGNOSIS — K591 Functional diarrhea: Secondary | ICD-10-CM | POA: Diagnosis not present

## 2017-06-10 LAB — CBC WITH DIFFERENTIAL/PLATELET
BASOS PCT: 1 %
Basophils Absolute: 0 10*3/uL (ref 0.0–0.1)
EOS ABS: 0.2 10*3/uL (ref 0.0–0.7)
EOS PCT: 3 %
HEMATOCRIT: 31.9 % — AB (ref 36.0–46.0)
Hemoglobin: 9.6 g/dL — ABNORMAL LOW (ref 12.0–15.0)
Lymphocytes Relative: 25 %
Lymphs Abs: 1.4 10*3/uL (ref 0.7–4.0)
MCH: 25.3 pg — ABNORMAL LOW (ref 26.0–34.0)
MCHC: 30.1 g/dL (ref 30.0–36.0)
MCV: 83.9 fL (ref 78.0–100.0)
MONO ABS: 0.5 10*3/uL (ref 0.1–1.0)
MONOS PCT: 8 %
Neutro Abs: 3.7 10*3/uL (ref 1.7–7.7)
Neutrophils Relative %: 63 %
PLATELETS: 397 10*3/uL (ref 150–400)
RBC: 3.8 MIL/uL — ABNORMAL LOW (ref 3.87–5.11)
RDW: 16.8 % — ABNORMAL HIGH (ref 11.5–15.5)
WBC: 5.8 10*3/uL (ref 4.0–10.5)

## 2017-06-10 LAB — COMPREHENSIVE METABOLIC PANEL
ALK PHOS: 72 U/L (ref 38–126)
ALT: 13 U/L — AB (ref 14–54)
AST: 18 U/L (ref 15–41)
Albumin: 2.9 g/dL — ABNORMAL LOW (ref 3.5–5.0)
Anion gap: 7 (ref 5–15)
BUN: 9 mg/dL (ref 6–20)
CALCIUM: 8.9 mg/dL (ref 8.9–10.3)
CHLORIDE: 106 mmol/L (ref 101–111)
CO2: 25 mmol/L (ref 22–32)
CREATININE: 0.69 mg/dL (ref 0.44–1.00)
GFR calc Af Amer: >60 mL/min (ref >60)
Glucose, Bld: 92 mg/dL (ref 65–99)
Potassium: 3.5 mmol/L (ref 3.5–5.1)
Sodium: 138 mmol/L (ref 135–145)
Total Bilirubin: 0.2 mg/dL — ABNORMAL LOW (ref 0.3–1.2)
Total Protein: 5.9 g/dL — ABNORMAL LOW (ref 6.5–8.1)

## 2017-06-10 LAB — LIPASE, BLOOD: LIPASE: 31 U/L (ref 11–51)

## 2017-06-10 MED ORDER — SODIUM CHLORIDE 0.9 % IV BOLUS (SEPSIS)
1000.0000 mL | Freq: Once | INTRAVENOUS | Status: AC
Start: 2017-06-10 — End: 2017-06-10
  Administered 2017-06-10: 1000 mL via INTRAVENOUS

## 2017-06-10 MED ORDER — IOPAMIDOL (ISOVUE-300) INJECTION 61%
INTRAVENOUS | Status: AC
  Administered 2017-06-10: 100 mL
  Filled 2017-06-10: qty 100

## 2017-06-10 NOTE — ED Triage Notes (Signed)
Per EMS: Pt has Hx of chronic Adb Pain. Pt is c/o of diarrhea x 7 days with 6-8 occurences a day. Pt has GI Doctor's appt on tues. Pt family does not want to wait till Tuesday due the amount of diarrhea. Pt has been taking imodium with no relief.  A&Ox4 VSS

## 2017-06-10 NOTE — ED Provider Notes (Signed)
[facility]-EMERGENCY DEPT Provider Note   CSN: [id number] Arrival date & time: 06/10/17  1322     History   Chief Complaint Chief Complaint  Patient presents with  . Abdominal Pain    HPI Katherine Flynn is a 83 y.o. female.  HPI Patient is a 83 year old female who presents with increasing diarrhea over the past week with anywhere from 6-10 episodes a day.  Denies blood in her stool.  No recent sick contacts.  Patient is scheduled to see her doctor in the coming days but family is concerned about her increasing generalized weakness and she is brought to the ER for further evaluation.  No recent antibiotics.  Patient reports diffuse generalized abdominal discomfort at this time.  Denies back pain.  No chest pain shortness breath.  Patient denies fevers and chills.  Patient reports decreased oral intake and generalized weakness.   Past Medical History:  Diagnosis Date  . Chronic pain syndrome   . COPD (chronic obstructive pulmonary disease) (HCC)   . GERD (gastroesophageal reflux disease)   . History of pyloric stenosis   . Hypertension   . Macular degeneration   . Osteoarthritis   . Thyroid disease   . Weight loss     Patient Active Problem List   Diagnosis Date Noted  . Acute upper gastrointestinal bleeding   . Bloody emesis   . Acute gastric ulcer with hemorrhage   . GI bleeding 04/06/2017  . Tachycardia 04/06/2017  . Dehydration 05/01/2016  . Multiple pelvic fractures 11/04/2015  . Fall at home 11/04/2015  . Prolonged Q-T interval on ECG 11/04/2015  . RBBB 11/04/2015  . Dilated bile duct 11/04/2015  . Pancreatic duct dilated 11/04/2015  . Abnormal CT of the abdomen 11/04/2015  . Fall 04/19/2015  . Protein-calorie malnutrition, severe (HCC) 04/19/2015  . Closed fracture of right iliac wing (HCC) 04/18/2015  . Peri-prosthetic fracture around prosthetic hip 04/18/2015  . Protein calorie malnutrition (HCC) 04/18/2015  . Iron deficiency anemia 12/30/2012  . Chronic  bronchitis with COPD (chronic obstructive pulmonary disease) (HCC) 11/19/2007  . UTI'S, RECURRENT 11/19/2007  . Hypothyroidism 11/04/2007  . Essential hypertension 11/04/2007  . GERD 11/04/2007  . Osteoarthritis 11/04/2007  . WEIGHT LOSS 11/04/2007  . PYLORIC STENOSIS 07/18/2007  . OTHER OBSTRUCTION OF DUODENUM 08/10/2006    Past Surgical History:  Procedure Laterality Date  . APPENDECTOMY    . CATARACT EXTRACTION    . ESOPHAGOGASTRODUODENOSCOPY (EGD) WITH PROPOFOL N/A 04/07/2017   Procedure: ESOPHAGOGASTRODUODENOSCOPY (EGD) WITH PROPOFOL;  Surgeon: Sherrilyn Rist, MD;  Location: [facility];  Service: Gastroenterology;  Laterality: N/A;  . ESOPHAGOGASTRODUODENOSCOPY (EGD) WITH PROPOFOL N/A 04/11/2017   Procedure: ESOPHAGOGASTRODUODENOSCOPY (EGD) WITH PROPOFOL;  Surgeon: Napoleon Form, MD;  Location: [facility];  Service: Endoscopy;  Laterality: N/A;  . JOINT REPLACEMENT    . nissan fundoplication  08/2007  . ORIF HIP FRACTURE    . TUBAL LIGATION      OB History    Gravida Para Term Preterm AB Living   SAB TAB Ectopic Multiple Live Births                   Home Medications    Prior to Admission medications   Medication Sig Start Date End Date Taking? Authorizing Provider  albuterol (PROVENTIL HFA;VENTOLIN HFA) 108 (90 Base) MCG/ACT inhaler Inhale 2 puffs into the lungs every 6 (six) hours as needed for wheezing or shortness of  breath. 05/17/17   Gordy Savers, MD  amLODipine (NORVASC) 5 MG tablet Take 1 tablet (5 mg total) by mouth daily. 04/15/17   Regalado, Belkys A, MD  collagenase (SANTYL) ointment Apply 1 application topically daily.    [provider]  levothyroxine (SYNTHROID, LEVOTHROID) 100 MCG tablet Take 1 tablet (100 mcg total) by mouth daily. 04/28/16   Gordy Savers, MD  metoprolol tartrate (LOPRESSOR) 25 MG tablet Take 1 tablet (25 mg total) by mouth 2 (two) times daily. 04/28/16   Gordy Savers, MD  Multiple  Vitamin (MULTIVITAMIN WITH MINERALS) TABS tablet Take 1 tablet by mouth daily.    [provider]  pantoprazole (PROTONIX) 40 MG tablet Take 1 tablet (40 mg total) by mouth 2 (two) times daily. 04/10/17   Zannie Cove, MD  PRESCRIPTION MEDICATION Take 240 mLs by mouth 2 (two) times daily. MedPass    [provider]  sucralfate (CARAFATE) 1 GM/10ML suspension Take 10 mLs (1 g total) by mouth 4 (four) times daily -  with meals and at bedtime. 04/14/17   Regalado, Prentiss Bells, MD    Family History No family history on file.  Social History Social History  Substance Use Topics  . Smoking status: Never Smoker  . Smokeless tobacco: Never Used  . Alcohol use No     Allergies   Patient has no known allergies.   Review of Systems Review of Systems  All other systems reviewed and are negative.    Physical Exam Updated Vital Signs BP (!) 115/58 (BP Location: Left Arm)   Pulse 70   Temp 97.7 F (36.5 C) (Oral)   Resp 12   Ht  (1.499 m)   Wt 39.5 kg (87 lb)   SpO2 99%   BMI 17.57 kg/m   Physical Exam  Constitutional: She is oriented to person, place, and time. She appears well-developed and well-nourished. No distress.  HENT:  Head: Normocephalic and atraumatic.  Eyes: EOM are normal.  Neck: Normal range of motion.  Cardiovascular: Normal rate, regular rhythm and normal heart sounds.   Pulmonary/Chest: Effort normal and breath sounds normal.  Abdominal: Soft. She exhibits no distension.  Generalized abdominal tenderness.  No guarding or rebound.  Musculoskeletal: Normal range of motion.  Neurological: She is alert and oriented to person, place, and time.  Skin: Skin is warm and dry.  Psychiatric: She has a normal mood and affect. Judgment normal.  Nursing note and vitals reviewed.    ED Treatments / Results  Labs (all labs ordered are listed, but only abnormal results are displayed) Labs Reviewed  CBC WITH DIFFERENTIAL/PLATELET - Abnormal;  Notable for the following:       Result Value   RBC 3.80 (*)    Hemoglobin 9.6 (*)    HCT 31.9 (*)    MCH 25.3 (*)    RDW 16.8 (*)    All other components within normal limits  C DIFFICILE QUICK SCREEN W PCR REFLEX  COMPREHENSIVE METABOLIC PANEL  LIPASE, BLOOD    EKG  EKG Interpretation None       Radiology No results found.  Procedures Procedures (including critical care time)  Medications Ordered in ED Medications  sodium chloride 0.9 % bolus 1,000 mL (1,000 mLs Intravenous New Bag/Given 06/10/17 1525)     Initial Impression / Assessment and Plan / ED Course  I have reviewed the triage vital signs and the nursing notes.  Pertinent labs & imaging results that were available during my  care of the patient were reviewed by me and considered in my medical decision making (see chart for details).     Patient abdominal tenderness at this time.  CT scan pending.  IV fluids for hydration now.  Will check electrolytes.  Urine.  No peritoneal signs  4:04 PM Care to Dr Jodi Mourning to follow up on labs, imaging and disposition planning  Final Clinical Impressions(s) / ED Diagnoses   Final diagnoses:  None    New Prescriptions New Prescriptions   No medications on file     Azalia Bilis, MD 06/10/17 [phone]

## 2017-06-10 NOTE — ED Provider Notes (Signed)
Patient signed out to follow-up CT scan results and reassess.  Patient presented with recurrent diarrhea over the past week approximately 5-10 episodes a day. No active bleeding. Blood work reassuring minimal drop in hemoglobin. CT scan results pending.  CT scan reviewed with radiology. Ulceration shown. Discussed this with the patient and family member and they are aware there is no ulcer there and there and followed closely by the [facility] gastroenterology. Patient feels comfortable going home with close outpatient follow-up.  Katherine Flynn, Quincy Simmonds, MD 06/10/17 [phone]

## 2017-06-10 NOTE — Discharge Instructions (Signed)
Follow-up with [facility] gastroenterology this week. Return for worsening symptoms such as persistent vomiting, feeling extremely weak, bloody stools or other concerns.  If you were given medicines take as directed.  If you are on coumadin or contraceptives realize their levels and effectiveness is altered by many different medicines.  If you have any reaction (rash, tongues swelling, other) to the medicines stop taking and see a physician.    If your blood pressure was elevated in the ER make sure you follow up for management with a primary doctor or return for chest pain, shortness of breath or stroke symptoms.  Please follow up as directed and return to the ER or see a physician for new or worsening symptoms.  Thank you. Vitals:   06/10/17 1326 06/10/17 1330  BP: (!) 115/58   Pulse: 70   Resp: 12   Temp: 97.7 F (36.5 C)   TempSrc: Oral   SpO2: 99%   Weight:  39.5 kg (87 lb)  Height:   (1.499 m)

## 2017-06-11 ENCOUNTER — Telehealth: Payer: Self-pay | Admitting: *Deleted

## 2017-06-11 NOTE — Telephone Encounter (Signed)
Left message and made patient aware.

## 2017-06-11 NOTE — Telephone Encounter (Signed)
levothyroxine (SYNTHROID, LEVOTHROID) 100 MCG tablets #90 tablets   With 4 refilled was callin today (06/11/2017)    TO: [facility] [address], [place] - [address]

## 2017-06-11 NOTE — Telephone Encounter (Signed)
Cordelia Pen called for patient requesting the thyroid medication to be refilled. Please advise

## 2017-06-11 NOTE — Telephone Encounter (Signed)
Spoke with [name] and advised. She is aware pt was in ED over weekend and diagnosed with gastric ulcer. Pt is scheduled to follow up with [facility] on Tues.  Also, [name] would like to make you aware that pt "is often left home alone" as her caregivers are not available 24/7. [name] has concerns about pt's ability to manage when left home alone.   Dr. Kirtland Bouchard - Lorain Childes. Thanks!

## 2017-06-12 ENCOUNTER — Encounter: Payer: Self-pay | Admitting: Gastroenterology

## 2017-06-12 ENCOUNTER — Ambulatory Visit (INDEPENDENT_AMBULATORY_CARE_PROVIDER_SITE_OTHER): Payer: Medicare HMO | Admitting: Gastroenterology

## 2017-06-12 VITALS — BP 128/62 | HR 60 | Ht <= 58 in | Wt 79.0 lb

## 2017-06-12 DIAGNOSIS — R197 Diarrhea, unspecified: Secondary | ICD-10-CM | POA: Diagnosis not present

## 2017-06-12 DIAGNOSIS — I10 Essential (primary) hypertension: Secondary | ICD-10-CM | POA: Diagnosis not present

## 2017-06-12 DIAGNOSIS — D62 Acute posthemorrhagic anemia: Secondary | ICD-10-CM | POA: Diagnosis not present

## 2017-06-12 DIAGNOSIS — K287 Chronic gastrojejunal ulcer without hemorrhage or perforation: Secondary | ICD-10-CM | POA: Diagnosis not present

## 2017-06-12 DIAGNOSIS — S81811D Laceration without foreign body, right lower leg, subsequent encounter: Secondary | ICD-10-CM | POA: Diagnosis not present

## 2017-06-12 DIAGNOSIS — K280 Acute gastrojejunal ulcer with hemorrhage: Secondary | ICD-10-CM | POA: Diagnosis not present

## 2017-06-12 DIAGNOSIS — R261 Paralytic gait: Secondary | ICD-10-CM | POA: Diagnosis not present

## 2017-06-12 DIAGNOSIS — M6281 Muscle weakness (generalized): Secondary | ICD-10-CM | POA: Diagnosis not present

## 2017-06-12 DIAGNOSIS — J449 Chronic obstructive pulmonary disease, unspecified: Secondary | ICD-10-CM | POA: Diagnosis not present

## 2017-06-12 DIAGNOSIS — G309 Alzheimer's disease, unspecified: Secondary | ICD-10-CM | POA: Diagnosis not present

## 2017-06-12 DIAGNOSIS — F0280 Dementia in other diseases classified elsewhere without behavioral disturbance: Secondary | ICD-10-CM | POA: Diagnosis not present

## 2017-06-12 NOTE — Progress Notes (Signed)
[facility] Progress Note  Chief Complaint: Follow-up from recent ED visit and hospital stay  Subjective  History:  This is a 83 year old woman I saw when she was admitted to the hospital in late July with melena and acute blood loss anemia. She was found to have a large Billroth anastomosis ulcer with clot. It was felt likely to be NSAID related, which she had been given by her caregivers for arthritis. A repeat upper endoscopy by Dr. Lavon Paganini the following week was required for ongoing bleeding. Benna Dunks can give no history or review of systems due to her debility. She is with her son August Saucer. He reports that the family took her to the emergency department a few days ago for several days of area. She was apparently having 3-4 loose stools per day without blood or black tarry stool. She was apparently complaining of some abdominal pain as well that was difficult to characterize. I reviewed the ED physician note. CT scan abdomen and pelvis was done, which revealed this large ulcer with surrounding inflammation causing inflammation of the adjacent transverse colon.  Other than recent hospital stay, there've been no other apparent risk factors for C. difficile such as sick contacts or antibiotic use.  ROS: No additional review of systems can be obtained from this patient as noted above.  The patient's Past Medical, Family and Social History were reviewed and are on file in the EMR.  Objective:  Med list reviewed  Current Outpatient Prescriptions:  .  albuterol (PROVENTIL HFA;VENTOLIN HFA) 108 (90 Base) MCG/ACT inhaler, Inhale 2 puffs into the lungs every 6 (six) hours as needed for wheezing or shortness of breath., Disp: 1 Inhaler, Rfl: 0 .  amLODipine (NORVASC) 5 MG tablet, Take 1 tablet (5 mg total) by mouth daily., Disp: 30 tablet, Rfl: 0 .  collagenase (SANTYL) ointment, Apply 1 application topically daily., Disp: , Rfl:  .  levothyroxine (SYNTHROID, LEVOTHROID) 100 MCG tablet, TAKE  ONE TABLET BY MOUTH ONCE DAILY, Disp: 90 tablet, Rfl: 4 .  loperamide (IMODIUM A-D) 2 MG tablet, Take 2-4 mg by mouth 4 (four) times daily as needed for diarrhea or loose stools., Disp: , Rfl:  .  metoprolol tartrate (LOPRESSOR) 25 MG tablet, Take 1 tablet (25 mg total) by mouth 2 (two) times daily., Disp: 180 tablet, Rfl: 4 .  Multiple Vitamin (MULTIVITAMIN WITH MINERALS) TABS tablet, Take 1 tablet by mouth daily., Disp: , Rfl:  .  pantoprazole (PROTONIX) 40 MG tablet, Take 1 tablet (40 mg total) by mouth 2 (two) times daily., Disp: 60 tablet, Rfl: 0 .  PRESCRIPTION MEDICATION, Take 240 mLs by mouth 2 (two) times daily. MedPass, Disp: , Rfl:  .  sucralfate (CARAFATE) 1 GM/10ML suspension, Take 10 mLs (1 g total) by mouth 4 (four) times daily -  with meals and at bedtime., Disp: 420 mL, Rfl: 0   Vital signs in last 24 hrs: Vitals:   06/12/17 1353  BP: 128/62  Pulse: 60    Physical Exam  Feeble elderly woman, in a wheelchair, quite thin, very hard of hearing.  HEENT: sclera anicteric, oral mucosa moist without lesions  Neck: supple, no thyromegaly, JVD or lymphadenopathy  Cardiac: RRR without murmurs, S1S2 heard, no peripheral edema  Pulm: clear to auscultation bilaterally, fair inspiratory effort, normal RR and effort noted  Abdomen: soft, no tenderness, with active bowel sounds.  Skin; warm and dry, no jaundice or rash  Recent Labs:  CBC Latest Ref Rng & Units 06/10/2017 05/17/2017  04/23/2017  WBC 4.0 - 10.5 K/uL 5.8 5.8 6.1  Hemoglobin 12.0 - 15.0 g/dL 4.0(J) 11.1(L) 11.4(L)  Hematocrit 36.0 - 46.0 % 31.9(L) 35.1(L) 36.3  Platelets 150 - 400 K/uL 397 383.0 519.0 Plt estimate matches analyzer count.(H)     Radiologic studies:  See CTAP report from 9/30.  Images personally reviewed  @ Assessment: Encounter Diagnoses  Name Primary?  . Chronic anastomotic ulcer of gastrojejunal region Yes  . Acute diarrhea    This ulcer will take many months to heal  given how large and deep it was. It appeared benign at the time of endoscopy. I do not think it is likely the cause of diarrhea. She seems most likely to have some acute infectious agent, so we have ordered C. difficile if the specimen can BE collected at home. Her son feels that they could manage this. Otherwise, I would manage her conservatively given her age and severe debility. I have asked her son to go through all of her meds at home and make sure there are no aspirin or NSAID-containing products. She will remain on PPI indefinitely, however that medicine may have limited utility to aid with the healing of such an anastomotic ulcer.   Total time 30 minutes, over half spent in record review, counseling and coordination of care.   Charlie Pitter III

## 2017-06-12 NOTE — Patient Instructions (Signed)
If you are age 81 or older, your body mass index should be between 23-30. Your Body mass index is 17.1 kg/m. If this is out of the aforementioned range listed, please consider follow up with your Primary Care Provider.  If you are age 40 or younger, your body mass index should be between 19-25. Your Body mass index is 17.1 kg/m. If this is out of the aformentioned range listed, please consider follow up with your Primary Care Provider.   Your physician has requested that you go to the basement for the following lab work before leaving today: Cdiff  Thank you for choosing [facility]  Dr Amada Jupiter III

## 2017-06-13 DIAGNOSIS — S81811D Laceration without foreign body, right lower leg, subsequent encounter: Secondary | ICD-10-CM | POA: Diagnosis not present

## 2017-06-13 DIAGNOSIS — I10 Essential (primary) hypertension: Secondary | ICD-10-CM | POA: Diagnosis not present

## 2017-06-13 DIAGNOSIS — K280 Acute gastrojejunal ulcer with hemorrhage: Secondary | ICD-10-CM | POA: Diagnosis not present

## 2017-06-13 DIAGNOSIS — F0280 Dementia in other diseases classified elsewhere without behavioral disturbance: Secondary | ICD-10-CM | POA: Diagnosis not present

## 2017-06-13 DIAGNOSIS — D62 Acute posthemorrhagic anemia: Secondary | ICD-10-CM | POA: Diagnosis not present

## 2017-06-13 DIAGNOSIS — R261 Paralytic gait: Secondary | ICD-10-CM | POA: Diagnosis not present

## 2017-06-13 DIAGNOSIS — G309 Alzheimer's disease, unspecified: Secondary | ICD-10-CM | POA: Diagnosis not present

## 2017-06-13 DIAGNOSIS — J449 Chronic obstructive pulmonary disease, unspecified: Secondary | ICD-10-CM | POA: Diagnosis not present

## 2017-06-13 DIAGNOSIS — M6281 Muscle weakness (generalized): Secondary | ICD-10-CM | POA: Diagnosis not present

## 2017-06-14 ENCOUNTER — Telehealth: Payer: Self-pay | Admitting: Internal Medicine

## 2017-06-14 ENCOUNTER — Other Ambulatory Visit: Payer: Medicare HMO

## 2017-06-14 DIAGNOSIS — K287 Chronic gastrojejunal ulcer without hemorrhage or perforation: Secondary | ICD-10-CM | POA: Diagnosis not present

## 2017-06-14 DIAGNOSIS — R197 Diarrhea, unspecified: Secondary | ICD-10-CM | POA: Diagnosis not present

## 2017-06-14 NOTE — Telephone Encounter (Signed)
Katherine Flynn with [facility] is call to get verbal orders for 1 x for 3 weeks for swallowing it is okay to leave msg on voicemail.

## 2017-06-15 DIAGNOSIS — D62 Acute posthemorrhagic anemia: Secondary | ICD-10-CM | POA: Diagnosis not present

## 2017-06-15 DIAGNOSIS — J449 Chronic obstructive pulmonary disease, unspecified: Secondary | ICD-10-CM | POA: Diagnosis not present

## 2017-06-15 DIAGNOSIS — I10 Essential (primary) hypertension: Secondary | ICD-10-CM | POA: Diagnosis not present

## 2017-06-15 DIAGNOSIS — G309 Alzheimer's disease, unspecified: Secondary | ICD-10-CM | POA: Diagnosis not present

## 2017-06-15 DIAGNOSIS — M6281 Muscle weakness (generalized): Secondary | ICD-10-CM | POA: Diagnosis not present

## 2017-06-15 DIAGNOSIS — F0280 Dementia in other diseases classified elsewhere without behavioral disturbance: Secondary | ICD-10-CM | POA: Diagnosis not present

## 2017-06-15 DIAGNOSIS — S81811D Laceration without foreign body, right lower leg, subsequent encounter: Secondary | ICD-10-CM | POA: Diagnosis not present

## 2017-06-15 DIAGNOSIS — K280 Acute gastrojejunal ulcer with hemorrhage: Secondary | ICD-10-CM | POA: Diagnosis not present

## 2017-06-15 DIAGNOSIS — R261 Paralytic gait: Secondary | ICD-10-CM | POA: Diagnosis not present

## 2017-06-15 LAB — C. DIFFICILE GDH AND TOXIN A/B
GDH ANTIGEN: NOT DETECTED
MICRO NUMBER:: 81103934
SPECIMEN QUALITY:: ADEQUATE
TOXIN A AND B: NOT DETECTED

## 2017-06-15 LAB — CLOSTRIDIUM DIFFICILE BY PCR: CDIFFPCR: NOT DETECTED

## 2017-06-18 ENCOUNTER — Telehealth: Payer: Self-pay | Admitting: Gastroenterology

## 2017-06-18 ENCOUNTER — Telehealth: Payer: Self-pay | Admitting: Internal Medicine

## 2017-06-18 ENCOUNTER — Telehealth: Payer: Self-pay

## 2017-06-18 DIAGNOSIS — I10 Essential (primary) hypertension: Secondary | ICD-10-CM | POA: Diagnosis not present

## 2017-06-18 DIAGNOSIS — M6281 Muscle weakness (generalized): Secondary | ICD-10-CM | POA: Diagnosis not present

## 2017-06-18 DIAGNOSIS — S81811D Laceration without foreign body, right lower leg, subsequent encounter: Secondary | ICD-10-CM | POA: Diagnosis not present

## 2017-06-18 DIAGNOSIS — K280 Acute gastrojejunal ulcer with hemorrhage: Secondary | ICD-10-CM | POA: Diagnosis not present

## 2017-06-18 DIAGNOSIS — J449 Chronic obstructive pulmonary disease, unspecified: Secondary | ICD-10-CM | POA: Diagnosis not present

## 2017-06-18 DIAGNOSIS — G309 Alzheimer's disease, unspecified: Secondary | ICD-10-CM | POA: Diagnosis not present

## 2017-06-18 DIAGNOSIS — R261 Paralytic gait: Secondary | ICD-10-CM | POA: Diagnosis not present

## 2017-06-18 DIAGNOSIS — F0280 Dementia in other diseases classified elsewhere without behavioral disturbance: Secondary | ICD-10-CM | POA: Diagnosis not present

## 2017-06-18 DIAGNOSIS — D62 Acute posthemorrhagic anemia: Secondary | ICD-10-CM | POA: Diagnosis not present

## 2017-06-18 NOTE — Telephone Encounter (Signed)
° ° ° ° °  [facility] calling to ask for Speech Therapy for 1 time a week for 4 weeks   Ok to leave message if no answer   Tammy   [phone]

## 2017-06-18 NOTE — Telephone Encounter (Signed)
Spoke with [place] and advised. Nothing further needed at this time.

## 2017-06-18 NOTE — Telephone Encounter (Signed)
Cindee Lame @ [facility] is asking for approval of continued PT for 1x/3wks. He states that pt is not doing well, not meeting goals and is not ready for discharge from services. He believes family may be looking at placement in ALF.  Dr. Kirtland Bouchard - Please advise. Thanks!

## 2017-06-18 NOTE — Telephone Encounter (Signed)
Spoke to patient's son, August Saucer, he said that the Kaopectate did help last week's diarrhea. On Friday evening it started again, kaopectate has become ineffective, even after several doses. She is drinking water, advised them to get some Pedialyte to help with possible electrolyte imbalance, I would send Dr. Myrtie Neither a note and contact them with further instructions.

## 2017-06-18 NOTE — Telephone Encounter (Signed)
Okay for PT order

## 2017-06-19 DIAGNOSIS — M6281 Muscle weakness (generalized): Secondary | ICD-10-CM | POA: Diagnosis not present

## 2017-06-19 DIAGNOSIS — K280 Acute gastrojejunal ulcer with hemorrhage: Secondary | ICD-10-CM | POA: Diagnosis not present

## 2017-06-19 DIAGNOSIS — D62 Acute posthemorrhagic anemia: Secondary | ICD-10-CM | POA: Diagnosis not present

## 2017-06-19 DIAGNOSIS — G309 Alzheimer's disease, unspecified: Secondary | ICD-10-CM | POA: Diagnosis not present

## 2017-06-19 DIAGNOSIS — F0280 Dementia in other diseases classified elsewhere without behavioral disturbance: Secondary | ICD-10-CM | POA: Diagnosis not present

## 2017-06-19 DIAGNOSIS — I10 Essential (primary) hypertension: Secondary | ICD-10-CM | POA: Diagnosis not present

## 2017-06-19 DIAGNOSIS — J449 Chronic obstructive pulmonary disease, unspecified: Secondary | ICD-10-CM | POA: Diagnosis not present

## 2017-06-19 DIAGNOSIS — R261 Paralytic gait: Secondary | ICD-10-CM | POA: Diagnosis not present

## 2017-06-19 DIAGNOSIS — S81811D Laceration without foreign body, right lower leg, subsequent encounter: Secondary | ICD-10-CM | POA: Diagnosis not present

## 2017-06-19 NOTE — Telephone Encounter (Signed)
Please see other phone note with same request.

## 2017-06-19 NOTE — Telephone Encounter (Signed)
I am sorry to hear she is still having trouble.  I do not know why this is happening to her, but I am concerned that she could easily become dehydrated.  Given her age and frailty, my advice is that her family bring her to the [facility] or [facility] emergency room.  She might require hospital admission to figure out what is causing this.

## 2017-06-19 NOTE — Telephone Encounter (Signed)
  CalledTammy from  [facility] and gave verbal orders per Dr Kirtland Bouchard for Speech Therapy for 1 time a week for 4 weeks

## 2017-06-19 NOTE — Telephone Encounter (Signed)
Spoke to patient's son, August Saucer, he said he got her some Pedialyte and caregiver said that she thought she was some better today. He will check on her, gave him Dr. Myrtie Neither advice about ED.

## 2017-06-21 DIAGNOSIS — I10 Essential (primary) hypertension: Secondary | ICD-10-CM | POA: Diagnosis not present

## 2017-06-21 DIAGNOSIS — D62 Acute posthemorrhagic anemia: Secondary | ICD-10-CM | POA: Diagnosis not present

## 2017-06-21 DIAGNOSIS — M6281 Muscle weakness (generalized): Secondary | ICD-10-CM | POA: Diagnosis not present

## 2017-06-21 DIAGNOSIS — F0280 Dementia in other diseases classified elsewhere without behavioral disturbance: Secondary | ICD-10-CM | POA: Diagnosis not present

## 2017-06-21 DIAGNOSIS — R261 Paralytic gait: Secondary | ICD-10-CM | POA: Diagnosis not present

## 2017-06-21 DIAGNOSIS — J449 Chronic obstructive pulmonary disease, unspecified: Secondary | ICD-10-CM | POA: Diagnosis not present

## 2017-06-21 DIAGNOSIS — G309 Alzheimer's disease, unspecified: Secondary | ICD-10-CM | POA: Diagnosis not present

## 2017-06-21 DIAGNOSIS — K280 Acute gastrojejunal ulcer with hemorrhage: Secondary | ICD-10-CM | POA: Diagnosis not present

## 2017-06-21 DIAGNOSIS — S81811D Laceration without foreign body, right lower leg, subsequent encounter: Secondary | ICD-10-CM | POA: Diagnosis not present

## 2017-06-22 ENCOUNTER — Inpatient Hospital Stay (HOSPITAL_COMMUNITY)
Admission: EM | Admit: 2017-06-22 | Discharge: 2017-06-29 | DRG: 391 | Disposition: A | Discharge status: Skilled Nursing Facility | Payer: Medicare HMO | Attending: Internal Medicine | Admitting: Internal Medicine

## 2017-06-22 ENCOUNTER — Encounter (HOSPITAL_COMMUNITY): Payer: Self-pay

## 2017-06-22 DIAGNOSIS — Z9660 Presence of unspecified orthopedic joint implant: Secondary | ICD-10-CM | POA: Diagnosis present

## 2017-06-22 DIAGNOSIS — K5730 Diverticulosis of large intestine without perforation or abscess without bleeding: Secondary | ICD-10-CM

## 2017-06-22 DIAGNOSIS — Z9849 Cataract extraction status, unspecified eye: Secondary | ICD-10-CM

## 2017-06-22 DIAGNOSIS — R1084 Generalized abdominal pain: Secondary | ICD-10-CM | POA: Diagnosis not present

## 2017-06-22 DIAGNOSIS — K529 Noninfective gastroenteritis and colitis, unspecified: Secondary | ICD-10-CM | POA: Diagnosis not present

## 2017-06-22 DIAGNOSIS — N390 Urinary tract infection, site not specified: Secondary | ICD-10-CM

## 2017-06-22 DIAGNOSIS — E43 Unspecified severe protein-calorie malnutrition: Secondary | ICD-10-CM | POA: Diagnosis not present

## 2017-06-22 DIAGNOSIS — R634 Abnormal weight loss: Secondary | ICD-10-CM | POA: Diagnosis not present

## 2017-06-22 DIAGNOSIS — E871 Hypo-osmolality and hyponatremia: Secondary | ICD-10-CM | POA: Diagnosis not present

## 2017-06-22 DIAGNOSIS — K274 Chronic or unspecified peptic ulcer, site unspecified, with hemorrhage: Secondary | ICD-10-CM | POA: Diagnosis not present

## 2017-06-22 DIAGNOSIS — R8281 Pyuria: Secondary | ICD-10-CM

## 2017-06-22 DIAGNOSIS — D509 Iron deficiency anemia, unspecified: Secondary | ICD-10-CM | POA: Diagnosis present

## 2017-06-22 DIAGNOSIS — E039 Hypothyroidism, unspecified: Secondary | ICD-10-CM | POA: Diagnosis present

## 2017-06-22 DIAGNOSIS — R9431 Abnormal electrocardiogram [ECG] [EKG]: Secondary | ICD-10-CM | POA: Diagnosis not present

## 2017-06-22 DIAGNOSIS — R933 Abnormal findings on diagnostic imaging of other parts of digestive tract: Secondary | ICD-10-CM | POA: Diagnosis not present

## 2017-06-22 DIAGNOSIS — Z681 Body mass index (BMI) 19 or less, adult: Secondary | ICD-10-CM

## 2017-06-22 DIAGNOSIS — M1990 Unspecified osteoarthritis, unspecified site: Secondary | ICD-10-CM | POA: Diagnosis present

## 2017-06-22 DIAGNOSIS — K591 Functional diarrhea: Secondary | ICD-10-CM | POA: Diagnosis not present

## 2017-06-22 DIAGNOSIS — G894 Chronic pain syndrome: Secondary | ICD-10-CM | POA: Diagnosis not present

## 2017-06-22 DIAGNOSIS — Z79899 Other long term (current) drug therapy: Secondary | ICD-10-CM | POA: Diagnosis not present

## 2017-06-22 DIAGNOSIS — Z66 Do not resuscitate: Secondary | ICD-10-CM | POA: Diagnosis present

## 2017-06-22 DIAGNOSIS — R1030 Lower abdominal pain, unspecified: Secondary | ICD-10-CM | POA: Diagnosis not present

## 2017-06-22 DIAGNOSIS — R1311 Dysphagia, oral phase: Secondary | ICD-10-CM | POA: Diagnosis not present

## 2017-06-22 DIAGNOSIS — K279 Peptic ulcer, site unspecified, unspecified as acute or chronic, without hemorrhage or perforation: Secondary | ICD-10-CM | POA: Diagnosis present

## 2017-06-22 DIAGNOSIS — Z9884 Bariatric surgery status: Secondary | ICD-10-CM

## 2017-06-22 DIAGNOSIS — K922 Gastrointestinal hemorrhage, unspecified: Secondary | ICD-10-CM | POA: Diagnosis not present

## 2017-06-22 DIAGNOSIS — R1013 Epigastric pain: Secondary | ICD-10-CM | POA: Diagnosis not present

## 2017-06-22 DIAGNOSIS — R109 Unspecified abdominal pain: Secondary | ICD-10-CM

## 2017-06-22 DIAGNOSIS — K254 Chronic or unspecified gastric ulcer with hemorrhage: Secondary | ICD-10-CM | POA: Diagnosis not present

## 2017-06-22 DIAGNOSIS — J449 Chronic obstructive pulmonary disease, unspecified: Secondary | ICD-10-CM | POA: Diagnosis not present

## 2017-06-22 DIAGNOSIS — R7989 Other specified abnormal findings of blood chemistry: Secondary | ICD-10-CM | POA: Diagnosis present

## 2017-06-22 DIAGNOSIS — H3530 Unspecified macular degeneration: Secondary | ICD-10-CM | POA: Diagnosis present

## 2017-06-22 DIAGNOSIS — R41841 Cognitive communication deficit: Secondary | ICD-10-CM | POA: Diagnosis not present

## 2017-06-22 DIAGNOSIS — Z8711 Personal history of peptic ulcer disease: Secondary | ICD-10-CM | POA: Diagnosis present

## 2017-06-22 DIAGNOSIS — R197 Diarrhea, unspecified: Secondary | ICD-10-CM | POA: Diagnosis not present

## 2017-06-22 DIAGNOSIS — R278 Other lack of coordination: Secondary | ICD-10-CM | POA: Diagnosis not present

## 2017-06-22 DIAGNOSIS — R64 Cachexia: Secondary | ICD-10-CM | POA: Diagnosis not present

## 2017-06-22 DIAGNOSIS — K219 Gastro-esophageal reflux disease without esophagitis: Secondary | ICD-10-CM | POA: Diagnosis not present

## 2017-06-22 DIAGNOSIS — I10 Essential (primary) hypertension: Secondary | ICD-10-CM | POA: Diagnosis present

## 2017-06-22 DIAGNOSIS — R2689 Other abnormalities of gait and mobility: Secondary | ICD-10-CM | POA: Diagnosis not present

## 2017-06-22 DIAGNOSIS — K210 Gastro-esophageal reflux disease with esophagitis: Secondary | ICD-10-CM | POA: Diagnosis not present

## 2017-06-22 DIAGNOSIS — E876 Hypokalemia: Secondary | ICD-10-CM | POA: Diagnosis present

## 2017-06-22 DIAGNOSIS — Z9049 Acquired absence of other specified parts of digestive tract: Secondary | ICD-10-CM

## 2017-06-22 DIAGNOSIS — R194 Change in bowel habit: Secondary | ICD-10-CM | POA: Diagnosis not present

## 2017-06-22 DIAGNOSIS — K52832 Lymphocytic colitis: Secondary | ICD-10-CM | POA: Diagnosis not present

## 2017-06-22 LAB — CBC WITH DIFFERENTIAL/PLATELET
Basophils Absolute: 0 10*3/uL (ref 0.0–0.1)
Basophils Relative: 0 %
Eosinophils Absolute: 0.1 10*3/uL (ref 0.0–0.7)
Eosinophils Relative: 0 %
HCT: 33.4 % — ABNORMAL LOW (ref 36.0–46.0)
Hemoglobin: 10.6 g/dL — ABNORMAL LOW (ref 12.0–15.0)
Lymphocytes Relative: 6 %
Lymphs Abs: 0.9 10*3/uL (ref 0.7–4.0)
MCH: 25.6 pg — ABNORMAL LOW (ref 26.0–34.0)
MCHC: 31.7 g/dL (ref 30.0–36.0)
MCV: 80.7 fL (ref 78.0–100.0)
Monocytes Absolute: 0.6 10*3/uL (ref 0.1–1.0)
Monocytes Relative: 5 %
Neutro Abs: 12 10*3/uL — ABNORMAL HIGH (ref 1.7–7.7)
Neutrophils Relative %: 89 %
Platelets: 517 10*3/uL — ABNORMAL HIGH (ref 150–400)
RBC: 4.14 MIL/uL (ref 3.87–5.11)
RDW: 14.8 % (ref 11.5–15.5)
WBC: 13.6 10*3/uL — ABNORMAL HIGH (ref 4.0–10.5)

## 2017-06-22 LAB — URINALYSIS, ROUTINE W REFLEX MICROSCOPIC
Bilirubin Urine: NEGATIVE
Glucose, UA: NEGATIVE mg/dL
Hgb urine dipstick: NEGATIVE
Ketones, ur: 5 mg/dL — AB
Nitrite: POSITIVE — AB
Protein, ur: 30 mg/dL — AB
Specific Gravity, Urine: 1.019 (ref 1.005–1.030)
pH: 5 (ref 5.0–8.0)

## 2017-06-22 LAB — COMPREHENSIVE METABOLIC PANEL
ALT: 12 U/L — ABNORMAL LOW (ref 14–54)
AST: 22 U/L (ref 15–41)
Albumin: 2.9 g/dL — ABNORMAL LOW (ref 3.5–5.0)
Alkaline Phosphatase: 105 U/L (ref 38–126)
Anion gap: 13 (ref 5–15)
BUN: 15 mg/dL (ref 6–20)
CO2: 26 mmol/L (ref 22–32)
Calcium: 9.1 mg/dL (ref 8.9–10.3)
Chloride: 99 mmol/L — ABNORMAL LOW (ref 101–111)
Creatinine, Ser: 0.74 mg/dL (ref 0.44–1.00)
GFR calc Af Amer: >60 mL/min (ref >60)
GFR calc non Af Amer: >60 mL/min (ref >60)
Glucose, Bld: 124 mg/dL — ABNORMAL HIGH (ref 65–99)
Potassium: 2.8 mmol/L — ABNORMAL LOW (ref 3.5–5.1)
Sodium: 138 mmol/L (ref 135–145)
Total Bilirubin: 0.3 mg/dL (ref 0.3–1.2)
Total Protein: 6.6 g/dL (ref 6.5–8.1)

## 2017-06-22 LAB — GASTROINTESTINAL PANEL BY PCR, STOOL (REPLACES STOOL CULTURE)

## 2017-06-22 LAB — LIPASE, BLOOD: Lipase: 31 U/L (ref 11–51)

## 2017-06-22 LAB — MAGNESIUM: MAGNESIUM: 2 mg/dL (ref 1.7–2.4)

## 2017-06-22 MED ORDER — LOPERAMIDE HCL 2 MG PO CAPS
2.0000 mg | ORAL_CAPSULE | Freq: Four times a day (QID) | ORAL | Status: DC | PRN
  Administered 2017-06-23: 4 mg via ORAL
  Administered 2017-06-26 (×2): 2 mg via ORAL
  Administered 2017-06-27 – 2017-06-29 (×4): 4 mg via ORAL
  Filled 2017-06-22 (×3): qty 2
  Filled 2017-06-22 (×2): qty 1
  Filled 2017-06-22 (×4): qty 2

## 2017-06-22 MED ORDER — ACETAMINOPHEN 500 MG PO TABS: 1000.0000 mg | ORAL_TABLET | Freq: Four times a day (QID) | ORAL | Status: DC | PRN

## 2017-06-22 MED ORDER — SODIUM CHLORIDE 0.9 % IV SOLN: INTRAVENOUS | Status: DC

## 2017-06-22 MED ORDER — POTASSIUM CHLORIDE IN NACL 40-0.9 MEQ/L-% IV SOLN
INTRAVENOUS | Status: DC
  Administered 2017-06-22: 50 mL/h via INTRAVENOUS
  Filled 2017-06-22 (×2): qty 1000

## 2017-06-22 MED ORDER — SODIUM CHLORIDE 0.9 % IV BOLUS (SEPSIS)
500.0000 mL | Freq: Once | INTRAVENOUS | Status: AC
  Administered 2017-06-22: 500 mL via INTRAVENOUS

## 2017-06-22 MED ORDER — ACETAMINOPHEN 500 MG PO TABS
1000.0000 mg | ORAL_TABLET | Freq: Three times a day (TID) | ORAL | Status: DC | PRN
  Administered 2017-06-22 – 2017-06-27 (×4): 1000 mg via ORAL
  Filled 2017-06-22 (×4): qty 2

## 2017-06-22 MED ORDER — METOPROLOL TARTRATE 25 MG PO TABS
25.0000 mg | ORAL_TABLET | Freq: Two times a day (BID) | ORAL | Status: DC
  Administered 2017-06-22 – 2017-06-29 (×14): 25 mg via ORAL
  Filled 2017-06-22 (×14): qty 1

## 2017-06-22 MED ORDER — POTASSIUM CHLORIDE CRYS ER 20 MEQ PO TBCR
40.0000 meq | EXTENDED_RELEASE_TABLET | Freq: Once | ORAL | Status: AC
  Administered 2017-06-22: 40 meq via ORAL
  Filled 2017-06-22: qty 2

## 2017-06-22 MED ORDER — ONDANSETRON HCL 4 MG/2ML IJ SOLN
4.0000 mg | Freq: Once | INTRAMUSCULAR | Status: DC
Start: 2017-06-22 — End: 2017-06-22

## 2017-06-22 MED ORDER — PANTOPRAZOLE SODIUM 40 MG PO TBEC
40.0000 mg | DELAYED_RELEASE_TABLET | Freq: Two times a day (BID) | ORAL | Status: DC
  Administered 2017-06-22 – 2017-06-29 (×14): 40 mg via ORAL
  Filled 2017-06-22 (×14): qty 1

## 2017-06-22 MED ORDER — ADULT MULTIVITAMIN W/MINERALS CH
1.0000 | ORAL_TABLET | Freq: Every day | ORAL | Status: DC
  Administered 2017-06-23 – 2017-06-29 (×7): 1 via ORAL
  Filled 2017-06-22 (×7): qty 1

## 2017-06-22 MED ORDER — LEVOTHYROXINE SODIUM 100 MCG PO TABS
100.0000 ug | ORAL_TABLET | Freq: Every day | ORAL | Status: DC
  Administered 2017-06-23 – 2017-06-29 (×7): 100 ug via ORAL
  Filled 2017-06-22 (×7): qty 1

## 2017-06-22 MED ORDER — DEXTROSE 5 % IV SOLN
1.0000 g | INTRAVENOUS | Status: AC
  Administered 2017-06-22 – 2017-06-24 (×3): 1 g via INTRAVENOUS
  Filled 2017-06-22 (×4): qty 10

## 2017-06-22 MED ORDER — POTASSIUM CHLORIDE 10 MEQ/100ML IV SOLN
10.0000 meq | Freq: Once | INTRAVENOUS | Status: AC
  Administered 2017-06-22: 10 meq via INTRAVENOUS
  Filled 2017-06-22: qty 100

## 2017-06-22 NOTE — ED Notes (Signed)
Report given to [id number].

## 2017-06-22 NOTE — H&P (Addendum)
History and Physical    Katherine Flynn TTS:[id number] DOB: Jun 06, 1923 DOA: 06/22/2017  PCP: Marletta Lor, MD  Patient coming from: home  I have personally briefly reviewed patient's old medical records in [place]  Chief Complaint: diarrhea  HPI: Katherine Flynn is a 83 y.o. female with medical history significant of hypothyroidism, PUD s/p billroth II gastrojejunostoy, reflux, esophagitis, pyloric stenosis, history of Nissen fundoplication, diverticulosis, with recent admission for an upper GI bleed secondary to a gastrojejunal anastomotic ulcer which was felt to be likely NSAID related who is presenting with 3 weeks of diarrhea.  Diarrhea has been present for about 3 weeks at this point time. She has several episodes of diarrhea today requiring her to be changed 3-4 times a day.  Diarrhea was clear one point in time, but now is more brownish. No bladder melanoma noted. She also describes a stabbing lower abdominal pain that moves around. She also comes and goes and last 2-3 hours with comes on. She describes this as sometimes worse when eating and occasionally better after bowel movements. She denies any fevers, nausea, vomiting, chest pain, shortness of breath. Her appetite has been decreased for the past few months. She does have some incontinence, but no clear urinary symptoms. She denies recent antibiotic use. She denies sick contacts. She lives at home alone. A neighbor helps her with daily activities.   They've tried Imodium and Kaopectate for the diarrhea which hasn't really helped. She's had significant weight loss over the past month.  ED Course: Labs, IV fluids, potassium.  Review of Systems: As per HPI otherwise 10 point review of systems negative.   Past Medical History:  Diagnosis Date  . Chronic pain syndrome   . COPD (chronic obstructive pulmonary disease) ([place])   . GERD (gastroesophageal reflux disease)   . History of pyloric stenosis   .  Hypertension   . Macular degeneration   . Osteoarthritis   . Thyroid disease   . Weight loss     Past Surgical History:  Procedure Laterality Date  . APPENDECTOMY    . CATARACT EXTRACTION    . ESOPHAGOGASTRODUODENOSCOPY (EGD) WITH PROPOFOL N/A 04/07/2017   Procedure: ESOPHAGOGASTRODUODENOSCOPY (EGD) WITH PROPOFOL;  Surgeon: Doran Stabler, MD;  Location: [facility];  Service: Gastroenterology;  Laterality: N/A;  . ESOPHAGOGASTRODUODENOSCOPY (EGD) WITH PROPOFOL N/A 04/11/2017   Procedure: ESOPHAGOGASTRODUODENOSCOPY (EGD) WITH PROPOFOL;  Surgeon: Mauri Pole, MD;  Location: [facility];  Service: Endoscopy;  Laterality: N/A;  . JOINT REPLACEMENT    . nissan fundoplication  05/2329  . ORIF HIP FRACTURE    . TUBAL LIGATION       reports that she has never smoked. She has never used smokeless tobacco. She reports that she does not drink alcohol or use drugs.  No Known Allergies  No family history on file.   Prior to Admission medications   Medication Sig Start Date End Date Taking? Authorizing Provider  acetaminophen (TYLENOL) 500 MG tablet Take 1,000 mg by mouth every 6 (six) hours as needed.   Yes [provider]  collagenase (SANTYL) ointment Apply 1 application topically daily.   Yes [provider]  levothyroxine (SYNTHROID, LEVOTHROID) 100 MCG tablet TAKE ONE TABLET BY MOUTH ONCE DAILY 06/11/17  Yes Marletta Lor, MD  loperamide (IMODIUM A-D) 2 MG tablet Take 2-4 mg by mouth 4 (four) times daily as needed for diarrhea or loose stools.   Yes [provider]  metoprolol tartrate (LOPRESSOR) 25 MG  tablet Take 1 tablet (25 mg total) by mouth 2 (two) times daily. 04/28/16  Yes Marletta Lor, MD  Multiple Vitamin (MULTIVITAMIN WITH MINERALS) TABS tablet Take 1 tablet by mouth daily.   Yes [provider]  pantoprazole (PROTONIX) 40 MG tablet Take 1 tablet (40 mg total) by mouth 2 (two) times daily. 04/10/17  Yes Domenic Polite, MD  albuterol (PROVENTIL HFA;VENTOLIN HFA) 108 (90 Base) MCG/ACT inhaler Inhale 2 puffs into the lungs every 6 (six) hours as needed for wheezing or shortness of breath. Patient not taking: Reported on 06/22/2017 05/17/17   Marletta Lor, MD  amLODipine (NORVASC) 5 MG tablet Take 1 tablet (5 mg total) by mouth daily. Patient not taking: Reported on 06/22/2017 04/15/17   Regalado, Jerald Kief A, MD  sucralfate (CARAFATE) 1 GM/10ML suspension Take 10 mLs (1 g total) by mouth 4 (four) times daily -  with meals and at bedtime. Patient not taking: Reported on 06/22/2017 04/14/17   Elmarie Shiley, MD    Physical Exam: Vitals:   06/22/17 1530 06/22/17 1600 06/22/17 1630 06/22/17 1820  BP: (!) 127/55 (!) 83/57 (!) 104/55 137/81  Pulse: 73 89 91 91  Resp: 16 19 (!) 24 (!) 22  Temp:    98.1 F (36.7 C)  TempSrc:    Oral  SpO2: 99% 100% 96% 99%  Weight:    35.2 kg (77 lb 9.6 oz)  Height:    5' (1.524 m)    Constitutional: NAD, calm, comfortable Vitals:   06/22/17 1530 06/22/17 1600 06/22/17 1630 06/22/17 1820  BP: (!) 127/55 (!) 83/57 (!) 104/55 137/81  Pulse: 73 89 91 91  Resp: 16 19 (!) 24 (!) 22  Temp:    98.1 F (36.7 C)  TempSrc:    Oral  SpO2: 99% 100% 96% 99%  Weight:    35.2 kg (77 lb 9.6 oz)  Height:    5' (1.524 m)   Frail, NAD Eyes: PERRL, lids and conjunctivae normal ENMT: Mucous membranes are moist. Posterior pharynx clear of any exudate or lesions.Normal dentition.  Neck: normal, supple, no masses, no thyromegaly Respiratory: clear to auscultation bilaterally, no wheezing, no crackles. Normal respiratory effort. No accessory muscle use.  Cardiovascular: Regular rate and rhythm, no murmurs / rubs / gallops. No extremity edema. 2+ pedal pulses. No carotid bruits.  Abdomen: Mild diffuse TTP, normal active bowel sounds.  No hepatosplenomegaly.  Musculoskeletal: no clubbing / cyanosis. No joint deformity upper and lower extremities. Good ROM, no contractures. Normal  muscle tone.  Skin: no rashes, lesions, ulcers. No induration Neurologic: CN 2-12 grossly intact. Sensation intact.  Moving all extremities Psychiatric: Normal judgment and insight. Alert and oriented x 1 (thought [place], April). Normal mood.   Labs on Admission: I have personally reviewed following labs and imaging studies  CBC:  Recent Labs Lab 06/22/17 1135  WBC 13.6*  NEUTROABS 12.0*  HGB 10.6*  HCT 33.4*  MCV 80.7  PLT 532*   Basic Metabolic Panel:  Recent Labs Lab 06/22/17 1135  NA 138  K 2.8*  CL 99*  CO2 26  GLUCOSE 124*  BUN 15  CREATININE 0.74  CALCIUM 9.1  MG 2.0   GFR: Estimated Creatinine Clearance: 25.5 mL/min (by C-G formula based on SCr of 0.74 mg/dL). Liver Function Tests:  Recent Labs Lab 06/22/17 1135  AST 22  ALT 12*  ALKPHOS 105  BILITOT 0.3  PROT 6.6  ALBUMIN 2.9*    Recent Labs Lab 06/22/17 1829  LIPASE 31   No results for input(s): AMMONIA in the last 168 hours. Coagulation Profile: No results for input(s): INR, PROTIME in the last 168 hours. Cardiac Enzymes: No results for input(s): CKTOTAL, CKMB, CKMBINDEX, TROPONINI in the last 168 hours. BNP (last 3 results) No results for input(s): PROBNP in the last 8760 hours. HbA1C: No results for input(s): HGBA1C in the last 72 hours. CBG: No results for input(s): GLUCAP in the last 168 hours. Lipid Profile: No results for input(s): CHOL, HDL, LDLCALC, TRIG, CHOLHDL, LDLDIRECT in the last 72 hours. Thyroid Function Tests: No results for input(s): TSH, T4TOTAL, FREET4, T3FREE, THYROIDAB in the last 72 hours. Anemia Panel: No results for input(s): VITAMINB12, FOLATE, FERRITIN, TIBC, IRON, RETICCTPCT in the last 72 hours. Urine analysis:    Component Value Date/Time   COLORURINE AMBER (A) 06/22/2017 1502   APPEARANCEUR CLOUDY (A) 06/22/2017 1502   LABSPEC 1.019 06/22/2017 1502   PHURINE 5.0 06/22/2017 1502   GLUCOSEU NEGATIVE 06/22/2017 1502   HGBUR NEGATIVE 06/22/2017  1502   BILIRUBINUR NEGATIVE 06/22/2017 1502   KETONESUR 5 (A) 06/22/2017 1502   PROTEINUR 30 (A) 06/22/2017 1502   UROBILINOGEN 0.2 04/20/2015 1452   NITRITE POSITIVE (A) 06/22/2017 1502   LEUKOCYTESUR LARGE (A) 06/22/2017 1502    Radiological Exams on Admission: No results found.   EKG: Independently reviewed. Appears similar to priors, but T wave flattening in I and inversion in aVL looks new.  RBBB.  Prolonged QTc.   Assessment/Plan Principal Problem:   Diarrhea Active Problems:   Hypothyroidism   Essential hypertension   GERD   PUD (peptic ulcer disease)   Abdominal pain   Pyuria   Diarrhea  Abdominal discomfort: present for 3 weeks.  No recent abx use.  Negative c diff on 10/4.  Saw GI in clinic.  Not sure how accurate weights are recently, but 44 kg at d/c on 8/4 and 35.2 kg today.  Infectious vs inflammatory with recent imaging, malabsorption also possible.   Had CT on 9/30 which showed persistent inflammatory changes at the gastrojejunostomy site as well as secondary inflammatory changes involving the transverse colon. Will hold off on reimaging at this point GI c/s, discussed with Dr. Collene Mares Lactoferrin, GI pathogen panel Imodium   Gastrojejunal anastomotic ulcer: Endoscopy on 8/1.  No recent bloody stool or melena.    PPI BID   Hypokalemia: 2/2 above.  Normal mg.  Replete.   IVF with K  Leukocytosis:  Possibly 2/2 diarrhea or UTI below.  CTM.   Anemia: ctm, f/u iron panel, b12, folate  Thrombocytosis: ctm   Likely UTI: Positive nitrite, LE.  Not clearly symptomatic, but will treat with ceftriaxone with her discomfort on exam as well as elevated white blood cell count.  [ ]  f/u cx, would d/c abx if not c/w infxn  Weight loss: Likely 2/2 diarrhea above, but she also notes decreased appetite over the past few months.  Nutrition c/s.  Continue to monitor.  Wt Readings from Last 3 Encounters:  06/22/17 35.2 kg (77 lb 9.6 oz)  06/12/17 35.8 kg (79 lb)  06/10/17  39.5 kg (87 lb)   Abnormal EKG  Prolonged QT:  Will repeat tomorrow, she's asymptomatic, but looks like she has some T wave flattening in I and inversion in aVL which looks new.  Also prolonged QTc, caution with QT prolonging meds.  Abnormal imaging findings from 9/30 CT:  Chronic postinflammatory changes within both lower lungs compatible with indolent atypical infection like MAC noted on  imaging.  She has no respiratory symptoms, think this can be addressed outpatient as indicated. Also chronic dilatation of CBD and pancreatic ducts seen on 9/30 CT as well.  Hx COPD: stable  Hypothyroid: synthroid HTN: metoprolol Delirium precautions  DVT prophylaxis: SCD  Code Status: DNR  Family Communication: daughter in room  Disposition Plan: pending  Consults called: GI  Admission status: tele    Fayrene Helper MD [facility] Pager [phone]   If 7PM-7AM, please contact night-coverage [email or web address] Password [email or web address]  06/22/2017, 7:14 PM

## 2017-06-22 NOTE — ED Provider Notes (Addendum)
[facility] DEPT Provider Note   CSN: [id number] Arrival date & time: 06/22/17  0909     History   Chief Complaint No chief complaint on file.   HPI Katherine Flynn is a 83 y.o. female.  HPI   89yF with abdominal pain and diarrhea . Diarrhea has been ongoing for several weeks. At times has briefly improved with kaopectate. In the last week she reports 3-6 watery stools. No blood or melena. Vague abdominal pain which she calls cramps. Says it comes and goes. Denies any pain currently. Recent testing for c-diff negative. No fever. No sick contacts.   Pt had 9 children. A daughter-in-law is currently at bedside and is a caregiver. She is concerned about significant weight loss in past month and also possible electrolyte abnormalities.   Past Medical History:  Diagnosis Date  . Chronic pain syndrome   . COPD (chronic obstructive pulmonary disease) ([place])   . GERD (gastroesophageal reflux disease)   . History of pyloric stenosis   . Hypertension   . Macular degeneration   . Osteoarthritis   . Thyroid disease   . Weight loss     Patient Active Problem List   Diagnosis Date Noted  . Acute upper gastrointestinal bleeding   . Bloody emesis   . Acute gastric ulcer with hemorrhage   . GI bleeding 04/06/2017  . Tachycardia 04/06/2017  . Dehydration 05/01/2016  . Multiple pelvic fractures 11/04/2015  . Fall at home 11/04/2015  . Prolonged Q-T interval on ECG 11/04/2015  . RBBB 11/04/2015  . Dilated bile duct 11/04/2015  . Pancreatic duct dilated 11/04/2015  . Abnormal CT of the abdomen 11/04/2015  . Fall 04/19/2015  . Protein-calorie malnutrition, severe ([place]) 04/19/2015  . Closed fracture of right iliac wing ([place]) 04/18/2015  . Peri-prosthetic fracture around prosthetic hip 04/18/2015  . Protein calorie malnutrition ([place]) 04/18/2015  . Iron deficiency anemia 12/30/2012  . Chronic bronchitis with COPD (chronic obstructive pulmonary disease) ([place]) 11/19/2007  .  UTI'S, RECURRENT 11/19/2007  . Hypothyroidism 11/04/2007  . Essential hypertension 11/04/2007  . GERD 11/04/2007  . Osteoarthritis 11/04/2007  . WEIGHT LOSS 11/04/2007  . PYLORIC STENOSIS 07/18/2007  . OTHER OBSTRUCTION OF DUODENUM 08/10/2006    Past Surgical History:  Procedure Laterality Date  . APPENDECTOMY    . CATARACT EXTRACTION    . ESOPHAGOGASTRODUODENOSCOPY (EGD) WITH PROPOFOL N/A 04/07/2017   Procedure: ESOPHAGOGASTRODUODENOSCOPY (EGD) WITH PROPOFOL;  Surgeon: Doran Stabler, MD;  Location: [facility];  Service: Gastroenterology;  Laterality: N/A;  . ESOPHAGOGASTRODUODENOSCOPY (EGD) WITH PROPOFOL N/A 04/11/2017   Procedure: ESOPHAGOGASTRODUODENOSCOPY (EGD) WITH PROPOFOL;  Surgeon: Mauri Pole, MD;  Location: [facility];  Service: Endoscopy;  Laterality: N/A;  . JOINT REPLACEMENT    . nissan fundoplication  03/3709  . ORIF HIP FRACTURE    . TUBAL LIGATION      OB History    Gravida Para Term Preterm AB Living   '12 9     3     ' SAB TAB Ectopic Multiple Live Births                   Home Medications    Prior to Admission medications   Medication Sig Start Date End Date Taking? Authorizing Provider  acetaminophen (TYLENOL) 500 MG tablet Take 1,000 mg by mouth every 6 (six) hours as needed.   Yes [provider]  collagenase (SANTYL) ointment Apply 1 application topically daily.   Yes [provider]  levothyroxine (SYNTHROID, [place])  100 MCG tablet TAKE ONE TABLET BY MOUTH ONCE DAILY 06/11/17  Yes Marletta Lor, MD  loperamide (IMODIUM A-D) 2 MG tablet Take 2-4 mg by mouth 4 (four) times daily as needed for diarrhea or loose stools.   Yes [provider]  metoprolol tartrate (LOPRESSOR) 25 MG tablet Take 1 tablet (25 mg total) by mouth 2 (two) times daily. 04/28/16  Yes Marletta Lor, MD  Multiple Vitamin (MULTIVITAMIN WITH MINERALS) TABS tablet Take 1 tablet by mouth daily.   Yes [provider]    pantoprazole (PROTONIX) 40 MG tablet Take 1 tablet (40 mg total) by mouth 2 (two) times daily. 04/10/17  Yes Domenic Polite, MD  albuterol (PROVENTIL HFA;VENTOLIN HFA) 108 (90 Base) MCG/ACT inhaler Inhale 2 puffs into the lungs every 6 (six) hours as needed for wheezing or shortness of breath. Patient not taking: Reported on 06/22/2017 05/17/17   Marletta Lor, MD  amLODipine (NORVASC) 5 MG tablet Take 1 tablet (5 mg total) by mouth daily. Patient not taking: Reported on 06/22/2017 04/15/17   Regalado, Jerald Kief A, MD  sucralfate (CARAFATE) 1 GM/10ML suspension Take 10 mLs (1 g total) by mouth 4 (four) times daily -  with meals and at bedtime. Patient not taking: Reported on 06/22/2017 04/14/17   Elmarie Shiley, MD    Family History No family history on file.  Social History Social History  Substance Use Topics  . Smoking status: Never Smoker  . Smokeless tobacco: Never Used  . Alcohol use No     Allergies   Patient has no known allergies.   Review of Systems Review of Systems  All systems reviewed and negative, other than as noted in HPI.  Physical Exam Updated Vital Signs BP (!) 120/58   Pulse 76   Temp 97.7 F (36.5 C) (Oral)   Resp 17   Ht '4\' 9"'  (1.448 m)   Wt 36.3 kg (80 lb)   SpO2 98%   BMI 17.31 kg/m   Physical Exam  Constitutional: She appears well-developed and well-nourished. No distress.  HENT:  Head: Normocephalic and atraumatic.  Eyes: Conjunctivae are normal. Right eye exhibits no discharge. Left eye exhibits no discharge.  Neck: Neck supple.  Cardiovascular: Normal rate, regular rhythm and normal heart sounds.  Exam reveals no gallop and no friction rub.   No murmur heard. Pulmonary/Chest: Effort normal and breath sounds normal. No respiratory distress.  Abdominal: Soft. She exhibits no distension. There is no tenderness.  Musculoskeletal: She exhibits no edema or tenderness.  Neurological: She is alert.  Skin: Skin is warm and dry.   Psychiatric: She has a normal mood and affect. Her behavior is normal. Thought content normal.  Nursing note and vitals reviewed.    ED Treatments / Results  Labs (all labs ordered are listed, but only abnormal results are displayed) Labs Reviewed  CBC WITH DIFFERENTIAL/PLATELET - Abnormal; Notable for the following:       Result Value   WBC 13.6 (*)    Hemoglobin 10.6 (*)    HCT 33.4 (*)    MCH 25.6 (*)    Platelets 517 (*)    Neutro Abs 12.0 (*)    All other components within normal limits  COMPREHENSIVE METABOLIC PANEL - Abnormal; Notable for the following:    Potassium 2.8 (*)    Chloride 99 (*)    Glucose, Bld 124 (*)    Albumin 2.9 (*)    ALT 12 (*)    All other components  within normal limits  URINALYSIS, ROUTINE W REFLEX MICROSCOPIC - Abnormal; Notable for the following:    Color, Urine AMBER (*)    APPearance CLOUDY (*)    Ketones, ur 5 (*)    Protein, ur 30 (*)    Nitrite POSITIVE (*)    Leukocytes, UA LARGE (*)    Bacteria, UA MANY (*)    Squamous Epithelial / LPF 0-5 (*)    All other components within normal limits  COMPREHENSIVE METABOLIC PANEL - Abnormal; Notable for the following:    Glucose, Bld 142 (*)    Calcium 8.5 (*)    Total Protein 6.2 (*)    Albumin 2.7 (*)    All other components within normal limits  CBC - Abnormal; Notable for the following:    WBC 13.1 (*)    RBC 3.78 (*)    Hemoglobin 9.6 (*)    HCT 30.7 (*)    MCH 25.4 (*)    Platelets 542 (*)    All other components within normal limits  IRON AND TIBC - Abnormal; Notable for the following:    Iron 9 (*)    Saturation Ratios 4 (*)    All other components within normal limits  VITAMIN B12 - Abnormal; Notable for the following:    Vitamin B-12 1,880 (*)    All other components within normal limits  CBC - Abnormal; Notable for the following:    RBC 3.74 (*)    Hemoglobin 9.5 (*)    HCT 30.3 (*)    MCH 25.4 (*)    Platelets 531 (*)    All other components within normal limits   BASIC METABOLIC PANEL - Abnormal; Notable for the following:    Calcium 8.4 (*)    All other components within normal limits  CBC - Abnormal; Notable for the following:    RBC 3.48 (*)    Hemoglobin 8.6 (*)    HCT 27.7 (*)    MCH 24.7 (*)    Platelets 467 (*)    All other components within normal limits  BASIC METABOLIC PANEL - Abnormal; Notable for the following:    Calcium 8.1 (*)    All other components within normal limits  GASTROINTESTINAL PANEL BY PCR, STOOL (REPLACES STOOL CULTURE)  MRSA PCR SCREENING  MAGNESIUM  LIPASE, BLOOD  FERRITIN  FOLATE  LACTOFERRIN, FECAL,QUALITATIVE  PANCREATIC ELASTASE, FECAL  SURGICAL PATHOLOGY    EKG  EKG Interpretation None       Radiology No results found.  Procedures Procedures (including critical care time)  Medications Ordered in ED Medications  sodium chloride 0.9 % bolus 500 mL (0 mLs Intravenous Stopped 06/22/17 1314)  potassium chloride 10 mEq in 100 mL IVPB (0 mEq Intravenous Stopped 06/22/17 1413)  potassium chloride SA (K-DUR,KLOR-CON) CR tablet 40 mEq (40 mEq Oral Given 06/22/17 1308)     Initial Impression / Assessment and Plan / ED Course  I have reviewed the triage vital signs and the nursing notes.  Pertinent labs & imaging results that were available during my care of the patient were reviewed by me and considered in my medical decision making (see chart for details).     Diarrhea, generalized weakness and hyperkalemia. Hyperkalemia is likely secondary from GI loss. I met first medical treatment and electrolyte management.  Final Clinical Impressions(s) / ED Diagnoses   Final diagnoses:  Diarrhea    New Prescriptions New Prescriptions   No medications on file     Virgel Manifold, MD 06/27/17  1032    Virgel Manifold, MD 06/27/17 1032

## 2017-06-22 NOTE — ED Notes (Signed)
Patient's visitor demanding that the patient be admitted. Writer explained to the Visitor tht the patient had to be examined by they ED doctor and necessary tests that the physician deems necessary and then a decision is made. Patient's visitor got on the phone with the patient's son/POA and stated that we were not cooperating and were not doing what they wanted.

## 2017-06-22 NOTE — Care Management Note (Signed)
Case Management Note  Patient Details  Name: Katherine Flynn MRN: [id number] Date of Birth: 06-Jun-1923  Action/Plan: CM noted pt was active with [facility].  Verified active with RN, PT, OT, NA, Speech, and SW.  Also verified pt has all the proper DME at home per Sarah with [facility].  Expected Discharge Date:  Unknown              Expected Discharge Plan:  Home w Home Health Services  Post Acute Care Choice:  Home Health Choice offered to:  Adult Children  HH Arranged:  RN, PT, OT, Nurse's Aide, Speech Therapy, Social Work [employer]:  [facility]  Status of Service:  In process, will continue to follow  Rica Koyanagi, RN 06/22/2017, 11:36 AM

## 2017-06-22 NOTE — ED Triage Notes (Signed)
Patient c/o constipation. Patient states she does not remember when she had BM. The person with the patient states her son has power of the attorney and wanted her to be brought here to solve the problem.

## 2017-06-22 NOTE — ED Notes (Signed)
Report 1700. Katherine Flynn [phone]

## 2017-06-22 NOTE — ED Notes (Signed)
IV attempted twice ( unsuccessful) Toni Amend, Charity fundraiser

## 2017-06-22 NOTE — ED Notes (Signed)
IV attempted twice (unsucesssfu) by logan vaughn, rn

## 2017-06-23 DIAGNOSIS — K52832 Lymphocytic colitis: Secondary | ICD-10-CM | POA: Diagnosis not present

## 2017-06-23 DIAGNOSIS — J449 Chronic obstructive pulmonary disease, unspecified: Secondary | ICD-10-CM | POA: Diagnosis present

## 2017-06-23 DIAGNOSIS — R1013 Epigastric pain: Secondary | ICD-10-CM | POA: Diagnosis not present

## 2017-06-23 DIAGNOSIS — Z9049 Acquired absence of other specified parts of digestive tract: Secondary | ICD-10-CM | POA: Diagnosis not present

## 2017-06-23 DIAGNOSIS — D509 Iron deficiency anemia, unspecified: Secondary | ICD-10-CM | POA: Diagnosis present

## 2017-06-23 DIAGNOSIS — E876 Hypokalemia: Secondary | ICD-10-CM | POA: Diagnosis present

## 2017-06-23 DIAGNOSIS — E43 Unspecified severe protein-calorie malnutrition: Secondary | ICD-10-CM | POA: Diagnosis present

## 2017-06-23 DIAGNOSIS — Z9849 Cataract extraction status, unspecified eye: Secondary | ICD-10-CM | POA: Diagnosis not present

## 2017-06-23 DIAGNOSIS — E871 Hypo-osmolality and hyponatremia: Secondary | ICD-10-CM | POA: Diagnosis not present

## 2017-06-23 DIAGNOSIS — Z8711 Personal history of peptic ulcer disease: Secondary | ICD-10-CM | POA: Diagnosis present

## 2017-06-23 DIAGNOSIS — Z9884 Bariatric surgery status: Secondary | ICD-10-CM | POA: Diagnosis not present

## 2017-06-23 DIAGNOSIS — N390 Urinary tract infection, site not specified: Secondary | ICD-10-CM | POA: Diagnosis present

## 2017-06-23 DIAGNOSIS — Z79899 Other long term (current) drug therapy: Secondary | ICD-10-CM | POA: Diagnosis not present

## 2017-06-23 DIAGNOSIS — Z681 Body mass index (BMI) 19 or less, adult: Secondary | ICD-10-CM | POA: Diagnosis not present

## 2017-06-23 DIAGNOSIS — I10 Essential (primary) hypertension: Secondary | ICD-10-CM | POA: Diagnosis present

## 2017-06-23 DIAGNOSIS — R197 Diarrhea, unspecified: Secondary | ICD-10-CM | POA: Diagnosis not present

## 2017-06-23 DIAGNOSIS — Z9660 Presence of unspecified orthopedic joint implant: Secondary | ICD-10-CM | POA: Diagnosis present

## 2017-06-23 DIAGNOSIS — K210 Gastro-esophageal reflux disease with esophagitis: Secondary | ICD-10-CM | POA: Diagnosis present

## 2017-06-23 DIAGNOSIS — K219 Gastro-esophageal reflux disease without esophagitis: Secondary | ICD-10-CM | POA: Diagnosis not present

## 2017-06-23 DIAGNOSIS — R1030 Lower abdominal pain, unspecified: Secondary | ICD-10-CM | POA: Diagnosis not present

## 2017-06-23 DIAGNOSIS — K279 Peptic ulcer, site unspecified, unspecified as acute or chronic, without hemorrhage or perforation: Secondary | ICD-10-CM | POA: Diagnosis present

## 2017-06-23 DIAGNOSIS — Z66 Do not resuscitate: Secondary | ICD-10-CM | POA: Diagnosis present

## 2017-06-23 DIAGNOSIS — K529 Noninfective gastroenteritis and colitis, unspecified: Secondary | ICD-10-CM | POA: Diagnosis present

## 2017-06-23 DIAGNOSIS — R634 Abnormal weight loss: Secondary | ICD-10-CM | POA: Diagnosis not present

## 2017-06-23 DIAGNOSIS — K5730 Diverticulosis of large intestine without perforation or abscess without bleeding: Secondary | ICD-10-CM | POA: Diagnosis present

## 2017-06-23 DIAGNOSIS — G894 Chronic pain syndrome: Secondary | ICD-10-CM | POA: Diagnosis present

## 2017-06-23 DIAGNOSIS — R7989 Other specified abnormal findings of blood chemistry: Secondary | ICD-10-CM | POA: Diagnosis present

## 2017-06-23 DIAGNOSIS — R64 Cachexia: Secondary | ICD-10-CM | POA: Diagnosis present

## 2017-06-23 DIAGNOSIS — M1990 Unspecified osteoarthritis, unspecified site: Secondary | ICD-10-CM | POA: Diagnosis present

## 2017-06-23 DIAGNOSIS — E039 Hypothyroidism, unspecified: Secondary | ICD-10-CM | POA: Diagnosis present

## 2017-06-23 DIAGNOSIS — H3530 Unspecified macular degeneration: Secondary | ICD-10-CM | POA: Diagnosis present

## 2017-06-23 LAB — CBC
HEMATOCRIT: 30.7 % — AB (ref 36.0–46.0)
HEMOGLOBIN: 9.6 g/dL — AB (ref 12.0–15.0)
MCH: 25.4 pg — AB (ref 26.0–34.0)
MCHC: 31.3 g/dL (ref 30.0–36.0)
MCV: 81.2 fL (ref 78.0–100.0)
Platelets: 542 10*3/uL — ABNORMAL HIGH (ref 150–400)
RBC: 3.78 MIL/uL — AB (ref 3.87–5.11)
RDW: 15.2 % (ref 11.5–15.5)
WBC: 13.1 10*3/uL — ABNORMAL HIGH (ref 4.0–10.5)

## 2017-06-23 LAB — COMPREHENSIVE METABOLIC PANEL
ALT: 14 U/L (ref 14–54)
ANION GAP: 10 (ref 5–15)
AST: 21 U/L (ref 15–41)
Albumin: 2.7 g/dL — ABNORMAL LOW (ref 3.5–5.0)
Alkaline Phosphatase: 101 U/L (ref 38–126)
BUN: 14 mg/dL (ref 6–20)
CHLORIDE: 106 mmol/L (ref 101–111)
CO2: 22 mmol/L (ref 22–32)
CREATININE: 0.66 mg/dL (ref 0.44–1.00)
Calcium: 8.5 mg/dL — ABNORMAL LOW (ref 8.9–10.3)
GFR calc non Af Amer: >60 mL/min (ref >60)
Glucose, Bld: 142 mg/dL — ABNORMAL HIGH (ref 65–99)
Potassium: 3.8 mmol/L (ref 3.5–5.1)
Sodium: 138 mmol/L (ref 135–145)
Total Bilirubin: 0.4 mg/dL (ref 0.3–1.2)
Total Protein: 6.2 g/dL — ABNORMAL LOW (ref 6.5–8.1)

## 2017-06-23 LAB — VITAMIN B12: VITAMIN B 12: 1880 pg/mL — AB (ref 180–914)

## 2017-06-23 LAB — IRON AND TIBC
Iron: 9 ug/dL — ABNORMAL LOW (ref 28–170)
Saturation Ratios: 4 % — ABNORMAL LOW (ref 10.4–31.8)
TIBC: 256 ug/dL (ref 250–450)
UIBC: 247 ug/dL

## 2017-06-23 LAB — FOLATE: FOLATE: 30 ng/mL (ref >5.9)

## 2017-06-23 LAB — FERRITIN: FERRITIN: 40 ng/mL (ref 11–307)

## 2017-06-23 LAB — LACTOFERRIN, FECAL, QUALITATIVE: LACTOFERRIN, FECAL, QUAL: NEGATIVE

## 2017-06-23 MED ORDER — LOPERAMIDE HCL 2 MG PO CAPS
4.0000 mg | ORAL_CAPSULE | Freq: Four times a day (QID) | ORAL | Status: AC
  Administered 2017-06-23 – 2017-06-24 (×3): 4 mg via ORAL
  Filled 2017-06-23 (×3): qty 2

## 2017-06-23 NOTE — Consult Note (Signed)
[facility]OSS [facility]OVER [facility] Reason for Consult: Severe diarrhea with weight loss.  Referring Physician: [facility]-Dr. Florene Glen.  Katherine Flynn is an 83 y.o. female.  HPI: Ms. Mysti Haley is a 83 year old frail white female with multiple multiple medical problems listed below admitted for further evaluation of severe diarrhea associated with abdominal cramping and about a 10 kg weight loss over the last 8 weeks. Patient is unable to quantify the number of bowel movements she is having a day she Claims it too numerous to count. She denies a history of melena or hematochezia. CT scan of the abdomen and pelvis done on 06/10/2017 reveals inflammatory changes involving the transverse colon along along with bowel wall edema and fat stranding; also noted was some persistent inflammatory changes in change of the gastrojejunostomy indicating persistent ulceration in this area; aortic atherosclerosis is also noted without any aneurysm. OTC Imodium etc. Have not helped her symptoms. She denies any recent use of antibiotics. There are no sick contacts. She is tested negative for C. difficile on 06/14/17.   Past Medical History:  Diagnosis Date  . Chronic pain syndrome   . COPD (chronic obstructive pulmonary disease) ([place])   . GERD (gastroesophageal reflux disease)   . History of pyloric stenosis   . Hypertension   . Macular degeneration   . Osteoarthritis   . Thyroid disease   . Abnormal weight loss    Past Surgical History:  Procedure Laterality Date  . APPENDECTOMY    . CATARACT EXTRACTION    . ESOPHAGOGASTRODUODENOSCOPY (EGD) WITH PROPOFOL N/A 04/07/2017   Procedure: ESOPHAGOGASTRODUODENOSCOPY (EGD) WITH PROPOFOL;  Surgeon: Doran Stabler, MD;  Location: [facility];  Service: Gastroenterology;  Laterality: N/A;  . ESOPHAGOGASTRODUODENOSCOPY (EGD) WITH PROPOFOL N/A 04/11/2017   Procedure: ESOPHAGOGASTRODUODENOSCOPY (EGD) WITH PROPOFOL;  Surgeon: Mauri Pole, MD;  Location: [facility];   Service: Endoscopy;  Laterality: N/A;  . JOINT REPLACEMENT    . nissan fundoplication  11/4915  . ORIF HIP FRACTURE    . TUBAL LIGATION     No family history on file.  Social History:  reports that she has never smoked. She has never used smokeless tobacco. She reports that she does not drink alcohol or use drugs.  Allergies: No Known Allergies  Medications: I have reviewed the patient's current medications.  Results for orders placed or performed during the hospital encounter of 06/22/17 (from the past 48 hour(s))  CBC with Differential     Status: Abnormal   Collection Time: 06/22/17 11:35 AM  Result Value Ref Range   WBC 13.6 (H) 4.0 - 10.5 K/uL   RBC 4.14 3.87 - 5.11 MIL/uL   Hemoglobin 10.6 (L) 12.0 - 15.0 g/dL   HCT 33.4 (L) 36.0 - 46.0 %   MCV 80.7 78.0 - 100.0 fL   MCH 25.6 (L) 26.0 - 34.0 pg   MCHC 31.7 30.0 - 36.0 g/dL   RDW 14.8 11.5 - 15.5 %   Platelets 517 (H) 150 - 400 K/uL   Neutrophils Relative % 89 %   Neutro Abs 12.0 (H) 1.7 - 7.7 K/uL   Lymphocytes Relative 6 %   Lymphs Abs 0.9 0.7 - 4.0 K/uL   Monocytes Relative 5 %   Monocytes Absolute 0.6 0.1 - 1.0 K/uL   Eosinophils Relative 0 %   Eosinophils Absolute 0.1 0.0 - 0.7 K/uL   Basophils Relative 0 %   Basophils Absolute 0.0 0.0 - 0.1 K/uL  Comprehensive metabolic panel     Status: Abnormal  Collection Time: 06/22/17 11:35 AM  Result Value Ref Range   Sodium 138 135 - 145 mmol/L   Potassium 2.8 (L) 3.5 - 5.1 mmol/L   Chloride 99 (L) 101 - 111 mmol/L   CO2 26 22 - 32 mmol/L   Glucose, Bld 124 (H) 65 - 99 mg/dL   BUN 15 6 - 20 mg/dL   Creatinine, Ser 0.74 0.44 - 1.00 mg/dL   Calcium 9.1 8.9 - 10.3 mg/dL   Total Protein 6.6 6.5 - 8.1 g/dL   Albumin 2.9 (L) 3.5 - 5.0 g/dL   AST 22 15 - 41 U/L   ALT 12 (L) 14 - 54 U/L   Alkaline Phosphatase 105 38 - 126 U/L   Total Bilirubin 0.3 0.3 - 1.2 mg/dL   GFR calc non Af Amer >60 >60 mL/min   GFR calc Af Amer >60 >60 mL/min    Comment: (NOTE) The eGFR has  been calculated using the CKD EPI equation. This calculation has not been validated in all clinical situations. eGFR's persistently <60 mL/min signify possible Chronic Kidney Disease.    Anion gap 13 5 - 15  Magnesium     Status: None   Collection Time: 06/22/17 11:35 AM  Result Value Ref Range   Magnesium 2.0 1.7 - 2.4 mg/dL  Gastrointestinal Panel by PCR , Stool     Status: None   Collection Time: 06/22/17  3:02 PM  Result Value Ref Range   Campylobacter species NOT DETECTED NOT DETECTED   Plesimonas shigelloides NOT DETECTED NOT DETECTED   Salmonella species NOT DETECTED NOT DETECTED   Yersinia enterocolitica NOT DETECTED NOT DETECTED   Vibrio species NOT DETECTED NOT DETECTED   Vibrio cholerae NOT DETECTED NOT DETECTED   Enteroaggregative E coli (EAEC) NOT DETECTED NOT DETECTED   Enteropathogenic E coli (EPEC) NOT DETECTED NOT DETECTED   Enterotoxigenic E coli (ETEC) NOT DETECTED NOT DETECTED   Shiga like toxin producing E coli (STEC) NOT DETECTED NOT DETECTED   Shigella/Enteroinvasive E coli (EIEC) NOT DETECTED NOT DETECTED   Cryptosporidium NOT DETECTED NOT DETECTED   Cyclospora cayetanensis NOT DETECTED NOT DETECTED   Entamoeba histolytica NOT DETECTED NOT DETECTED   Giardia lamblia NOT DETECTED NOT DETECTED   Adenovirus F40/41 NOT DETECTED NOT DETECTED   Astrovirus NOT DETECTED NOT DETECTED   Norovirus GI/GII NOT DETECTED NOT DETECTED   Rotavirus A NOT DETECTED NOT DETECTED   Sapovirus (I, II, IV, and V) NOT DETECTED NOT DETECTED  Urinalysis, Routine w reflex microscopic     Status: Abnormal   Collection Time: 06/22/17  3:02 PM  Result Value Ref Range   Color, Urine AMBER (A) YELLOW    Comment: BIOCHEMICALS MAY BE AFFECTED BY COLOR   APPearance CLOUDY (A) CLEAR   Specific Gravity, Urine 1.019 1.005 - 1.030   pH 5.0 5.0 - 8.0   Glucose, UA NEGATIVE NEGATIVE mg/dL   Hgb urine dipstick NEGATIVE NEGATIVE   Bilirubin Urine NEGATIVE NEGATIVE   Ketones, ur 5 (A) NEGATIVE  mg/dL   Protein, ur 30 (A) NEGATIVE mg/dL   Nitrite POSITIVE (A) NEGATIVE   Leukocytes, UA LARGE (A) NEGATIVE   RBC / HPF 0-5 0 - 5 RBC/hpf   WBC, UA TOO NUMEROUS TO COUNT 0 - 5 WBC/hpf   Bacteria, UA MANY (A) NONE SEEN   Squamous Epithelial / LPF 0-5 (A) NONE SEEN   WBC Clumps PRESENT    Mucus PRESENT   Lipase, blood     Status: None  Collection Time: 06/22/17  6:29 PM  Result Value Ref Range   Lipase 31 11 - 51 U/L  Comprehensive metabolic panel     Status: Abnormal   Collection Time: 06/23/17  5:18 AM  Result Value Ref Range   Sodium 138 135 - 145 mmol/L   Potassium 3.8 3.5 - 5.1 mmol/L    Comment: DELTA CHECK NOTED   Chloride 106 101 - 111 mmol/L   CO2 22 22 - 32 mmol/L   Glucose, Bld 142 (H) 65 - 99 mg/dL   BUN 14 6 - 20 mg/dL   Creatinine, Ser 0.66 0.44 - 1.00 mg/dL   Calcium 8.5 (L) 8.9 - 10.3 mg/dL   Total Protein 6.2 (L) 6.5 - 8.1 g/dL   Albumin 2.7 (L) 3.5 - 5.0 g/dL   AST 21 15 - 41 U/L   ALT 14 14 - 54 U/L   Alkaline Phosphatase 101 38 - 126 U/L   Total Bilirubin 0.4 0.3 - 1.2 mg/dL   GFR calc non Af Amer >60 >60 mL/min   GFR calc Af Amer >60 >60 mL/min    Comment: (NOTE) The eGFR has been calculated using the CKD EPI equation. This calculation has not been validated in all clinical situations. eGFR's persistently <60 mL/min signify possible Chronic Kidney Disease.    Anion gap 10 5 - 15  CBC     Status: Abnormal   Collection Time: 06/23/17  5:18 AM  Result Value Ref Range   WBC 13.1 (H) 4.0 - 10.5 K/uL   RBC 3.78 (L) 3.87 - 5.11 MIL/uL   Hemoglobin 9.6 (L) 12.0 - 15.0 g/dL   HCT 30.7 (L) 36.0 - 46.0 %   MCV 81.2 78.0 - 100.0 fL   MCH 25.4 (L) 26.0 - 34.0 pg   MCHC 31.3 30.0 - 36.0 g/dL   RDW 15.2 11.5 - 15.5 %   Platelets 542 (H) 150 - 400 K/uL   Review of Systems  Constitutional: Positive for malaise/fatigue and weight loss. Negative for chills, diaphoresis and fever.  HENT: Negative.   Eyes: Negative.   Respiratory: Positive for shortness of  breath.   Cardiovascular: Negative.   Gastrointestinal: Positive for abdominal pain, diarrhea and heartburn. Negative for blood in stool, melena, nausea and vomiting.  Genitourinary: Negative.   Musculoskeletal: Positive for joint pain and myalgias.  Skin: Negative.   Neurological: Positive for dizziness and weakness.  Psychiatric/Behavioral: The patient is nervous/anxious.    Blood pressure (!) 143/62, pulse 77, temperature 98.4 F (36.9 C), temperature source Oral, resp. rate (!) 21, height 5' (1.524 m), weight 35.2 kg (77 lb 9.6 oz), SpO2 99 %. Physical Exam  Constitutional:  Very frail elderly white female, seems exhausted and fatigued  HENT:  Head: Atraumatic.  Eyes: Lids are normal.  Neck: Trachea normal. No thyroid mass present.  Cardiovascular: Regular rhythm.   Respiratory: Effort normal and breath sounds normal.  GI: Soft. Normal appearance. There is no hepatosplenomegaly. There is tenderness in the periumbilical area.  Skin: Skin is warm and dry.   Assessment/Plan: 1) Change in bowel habits/diarrhea with abnormal weight loss and colitis noted on a CT scan done recently. Plans are to do Flexeril sigmoidoscopy on Monday with biopsies to further workup her symptoms. We'll also check a fecal elastase to rule out exocrine pancreatic insufficiency  2) Ulceration at the anastomosis from a gastrojejunostomy on PPI's and Carafate.  Tonny Isensee 06/23/2017, 8:04 AM

## 2017-06-23 NOTE — Evaluation (Signed)
Occupational Therapy Evaluation Patient Details Name: Katherine Flynn MRN: [id number] DOB: 1923-01-27 Today's Date: 06/23/2017    History of Present Illness 83 yo female admitted with GI bleed, anemia. Hx of multiple falls, low back pain, OA, COPD, GERD.    Clinical Impression   Pt admitted with GI bleed. Pt currently with functional limitations due to the deficits listed below (see OT Problem List).  Pt will benefit from skilled OT to increase their safety and independence with ADL and functional mobility for ADL to facilitate discharge to venue listed below.      Follow Up Recommendations  SNF    Equipment Recommendations  None recommended by OT       Precautions / Restrictions Precautions Precautions: Fall Restrictions Weight Bearing Restrictions: No      Mobility Bed Mobility Overal bed mobility: Needs Assistance Bed Mobility: Rolling Rolling: Min assist;Mod assist         General bed mobility comments: multiples rolls L and R  Transfers               not performed            ADL either performed or assessed with clinical judgement   ADL Overall ADL's : Needs assistance/impaired                                       General ADL Comments: Upon attempting to sit EOB pt had diarrhea multiple times. Pt performed bed mobility multiple times for clean up.  Pt fatigued after this activity                   Pertinent Vitals/Pain Pain Assessment: No/denies pain        Extremity/Trunk Assessment Upper Extremity Assessment Upper Extremity Assessment: Generalized weakness           Communication Communication Communication: No difficulties   Cognition Arousal/Alertness: Awake/alert Behavior During Therapy: WFL for tasks assessed/performed Overall Cognitive Status: Within Functional Limits for tasks assessed                                     General Comments   diarrhea limiting session             Home Living Family/patient expects to be discharged to:: Private residence Living Arrangements: Alone   Type of Home: House Home Access: Stairs to enter   Entrance Stairs-Rails: Right Home Layout: One level     Bathroom Shower/Tub: Chief Strategy Officer: Standard                Prior Functioning/Environment Level of Independence: Needs assistance  Gait / Transfers Assistance Needed: Uses RW for ambulation ADL's / Homemaking Assistance Needed: per pt, she has an aide in the mornings            OT Problem List: Decreased strength;Decreased activity tolerance;Impaired balance (sitting and/or standing);Decreased knowledge of use of DME or AE      OT Treatment/Interventions: Self-care/ADL training;Patient/family education;DME and/or AE instruction    OT Goals(Current goals can be found in the care plan section) Acute Rehab OT Goals Patient Stated Goal: stop thisdiarrhea OT Goal Formulation: With patient Time For Goal Achievement: 07/07/17 Potential to Achieve Goals: Good  OT Frequency: Min 2X/week   Barriers to D/C: Decreased caregiver support  AM-PAC PT "6 Clicks" Daily Activity     Outcome Measure Help from another person eating meals?: A Little Help from another person taking care of personal grooming?: A Little Help from another person toileting, which includes using toliet, bedpan, or urinal?: Total Help from another person bathing (including washing, rinsing, drying)?: A Lot Help from another person to put on and taking off regular upper body clothing?: Total Help from another person to put on and taking off regular lower body clothing?: Total 6 Click Score: 11   End of Session Nurse Communication: Mobility status  Activity Tolerance: Patient limited by fatigue Patient left: in bed;with call bell/phone within reach;with bed alarm set;with nursing/sitter in room  OT Visit Diagnosis: Muscle weakness (generalized)  (M62.81);History of falling (Z91.81);Unsteadiness on feet (R26.81)                Time: 1610-9604 OT Time Calculation (min): 29 min   [place], [place] [phone]  Alba Cory 06/23/2017, 12:33 PM

## 2017-06-23 NOTE — Progress Notes (Signed)
PT Cancellation Note  Patient Details Name: [name] MRN: [id number] DOB: 1922/12/24   Cancelled Treatment:    Nursing reports pt has had continued diarrhea today so PT is deferred for today. Will follow up tomorroe    Donnetta Hail 06/23/2017, 12:53 PM

## 2017-06-23 NOTE — Progress Notes (Signed)
_0 @        PROGRESS NOTE                                                                                                                                                                                                             Patient Demographics:    Katherine Flynn, is a 83 y.o. female, DOB - 12/07/1922, QIO:[id number]  Admit date - 06/22/2017   Admitting Physician A Melven Sartorius., MD  Outpatient Primary MD for the patient is Marletta Lor, MD  LOS - 0  No chief complaint on file.      Brief Narrative  Katherine Flynn is a 83 y.o. female with medical history significant of hypothyroidism, PUD s/p billroth II gastrojejunostoy, reflux, esophagitis, pyloric stenosis, history of Nissen fundoplication, diverticulosis, with recent admission for an upper GI bleed secondary to a gastrojejunal anastomotic ulcer which was felt to be likely NSAID related who is presenting with 3 weeks of diarrhea.   Subjective:    Domingo Cocking today has, No headache, No chest pain, Has mild generalized abdominal pain - No Nausea, No new weakness tingling or numbness, No Cough - SOB.     Assessment  & Plan :     1.Subacute to chronic diarrhea ongoing now for 3 weeks. No clear etiology, was negative for C. difficile on 06/14/2017 and GI office, GI pathogen panel negative, lactoferrin pending, will give her 3 doses of scheduled Imodium, gastroenterologist Dr. Collene Mares was consulted at the time of admission await her input, continue supportive care. CT scan nonacute, abdominal exam is benign. Monitor.  2. Gastric bypass surgery with GJ anastomotic ulcer. Continue twice a day PPI and monitor clinically.  3. Hypokalemia. Replace and monitor with magnesium levels.  4. Anemia of chronic and iron deficiency. Continue to monitor no need for transfusion. Once better place on oral iron.  5. UTI with leukocytosis. 3 days of Rocephin.  6. Unintentional weight loss due to diarrhea. Monitor,  once diarrhea but her protein supplementation.  7. Possible mac infection noted on CT scan. Supportive care, not a candidate for multiple antibiotics at this time.    Diet : Diet regular Room service appropriate? Yes; Fluid consistency: Thin    Family Communication  :  None  Code Status :  DNR  Disposition Plan  :  TBD  Consults  :  GI DR Collene Mares, called upon admission  Procedures  :    CT - : 1. Complex upper GI anatomy status post Billroth II gastrojejunostomy. 2. There are  persistent inflammatory changes at the gastrojejunostomy site as demonstrated on upper endoscopy from 04/15/2017. Persistent ulceration within this area is suspected. No free intraperitoneal air identified. 3. Secondary inflammatory changes are noted involving the transverse colon which may explain patient's worsening diarrhea and abdominal pain. No significant free fluid or fluid collections identified. 4. Chronic postinflammatory changes within both lower lung zones compatible with indolent atypical infection such as mycobacterium avium complex. 5. Unchanged chronic dilatation of the common bile and pancreatic ducts.   DVT Prophylaxis  :   SCDs    Lab Results  Component Value Date   PLT 542 (H) 06/23/2017    Inpatient Medications  Scheduled Meds: . levothyroxine  100 mcg Oral QAC breakfast  . metoprolol tartrate  25 mg Oral BID  . multivitamin with minerals  1 tablet Oral Daily  . pantoprazole  40 mg Oral BID   Continuous Infusions: . cefTRIAXone (ROCEPHIN)  IV Stopped (06/22/17 2056)   PRN Meds:.acetaminophen, loperamide  Antibiotics  :    Anti-infectives    Start     Dose/Rate Route Frequency Ordered Stop   06/22/17 2000  cefTRIAXone (ROCEPHIN) 1 g in dextrose 5 % 50 mL IVPB     1 g 100 mL/hr over 30 Minutes Intravenous Every 24 hours 06/22/17 1841 06/25/17 1959         Objective:   Vitals:   06/22/17 1630 06/22/17 1820 06/22/17 2148 06/23/17 0647  BP: (!) 104/55 137/81 (!) 145/65 (!)  143/62  Pulse: 91 91 90 77  Resp: (!) 24 (!) 22 (!) 21   Temp:  98.1 F (36.7 C) 98 F (36.7 C) 98.4 F (36.9 C)  TempSrc:  Oral Oral Oral  SpO2: 96% 99% 99% 99%  Weight:  35.2 kg (77 lb 9.6 oz)    Height:  5' (1.524 m)      Wt Readings from Last 3 Encounters:  06/22/17 35.2 kg (77 lb 9.6 oz)  06/12/17 35.8 kg (79 lb)  06/10/17 39.5 kg (87 lb)     Intake/Output Summary (Last 24 hours) at 06/23/17 1359 Last data filed at 06/23/17 1000  Gross per 24 hour  Intake          1136.67 ml  Output                0 ml  Net          1136.67 ml     Physical Exam  Green[place] frail elderly white female lying in hospital bed in no distress, Awake Alert,  No new F.N deficits, Normal affect [place].AT,PERRAL Supple Neck,No JVD, No cervical lymphadenopathy appriciated.  Symmetrical Chest wall movement, Good air movement bilaterally, CTAB RRR,No Gallops,Rubs or new Murmurs, No Parasternal Heave +ve B.Sounds, Abd Soft, No tenderness, No organomegaly appriciated, No rebound - guarding or rigidity. No Cyanosis, Clubbing or edema, No new Rash or bruise       Data Review:    CBC  Recent Labs Lab 06/22/17 1135 06/23/17 0518  WBC 13.6* 13.1*  HGB 10.6* 9.6*  HCT 33.4* 30.7*  PLT 517* 542*  MCV 80.7 81.2  MCH 25.6* 25.4*  MCHC 31.7 31.3  RDW 14.8 15.2  LYMPHSABS 0.9  --   MONOABS 0.6  --   EOSABS 0.1  --   BASOSABS 0.0  --     Chemistries   Recent Labs Lab 06/22/17 1135 06/23/17 0518  NA 138 138  K 2.8* 3.8  CL 99* 106  CO2 26 22  GLUCOSE 124*  142*  BUN 15 14  CREATININE 0.74 0.66  CALCIUM 9.1 8.5*  MG 2.0  --   AST 22 21  ALT 12* 14  ALKPHOS 105 101  BILITOT 0.3 0.4   ------------------------------------------------------------------------------------------------------------------ No results for input(s): CHOL, HDL, LDLCALC, TRIG, CHOLHDL, LDLDIRECT in the last 72 hours.  Lab Results  Component Value Date   HGBA1C 5.6 04/19/2015    ------------------------------------------------------------------------------------------------------------------ No results for input(s): TSH, T4TOTAL, T3FREE, THYROIDAB in the last 72 hours.  Invalid input(s): FREET3 ------------------------------------------------------------------------------------------------------------------  Recent Labs  06/23/17 0518  VITAMINB12 1,880*  FOLATE 30.0  FERRITIN 40  TIBC 256  IRON 9*    Coagulation profile No results for input(s): INR, PROTIME in the last 168 hours.  No results for input(s): DDIMER in the last 72 hours.  Cardiac Enzymes No results for input(s): CKMB, TROPONINI, MYOGLOBIN in the last 168 hours.  Invalid input(s): CK ------------------------------------------------------------------------------------------------------------------ No results found for: BNP  Micro Results Recent Results (from the past 240 hour(s))  Clostridium Difficile by PCR     Status: None   Collection Time: 06/14/17  1:00 PM  Result Value Ref Range Status   Toxigenic C Difficile by pcr NOT DETECTED NOT DETECT Final    Comment: . This test is for use only with liquid or soft stools;  performance characteristics of other clinical specimen  types have not been established. . This assay was performed by C[employer]heid GeneXpert(R) PCR. The performance characteristics of this assay have been determined by [employer]. Performance characteristics refer to the analytical performance of the test. . For additional information, please refer to http://education.[facility].com/faq/FAQ136 (This link is being provided for informational/educational purposes only.)   Gastrointestinal Panel by PCR , Stool     Status: None   Collection Time: 06/22/17  3:02 PM  Result Value Ref Range Status   Campylobacter species NOT DETECTED NOT DETECTED Final   Plesimonas shigelloides NOT DETECTED NOT DETECTED Final   Salmonella species NOT DETECTED NOT DETECTED  Final   Yersinia enterocolitica NOT DETECTED NOT DETECTED Final   Vibrio species NOT DETECTED NOT DETECTED Final   Vibrio cholerae NOT DETECTED NOT DETECTED Final   Enteroaggregative E coli (EAEC) NOT DETECTED NOT DETECTED Final   Enteropathogenic E coli (EPEC) NOT DETECTED NOT DETECTED Final   Enterotoxigenic E coli (ETEC) NOT DETECTED NOT DETECTED Final   Shiga like toxin producing E coli (STEC) NOT DETECTED NOT DETECTED Final   Shigella/Enteroinvasive E coli (EIEC) NOT DETECTED NOT DETECTED Final   Cryptosporidium NOT DETECTED NOT DETECTED Final   Cyclospora cayetanensis NOT DETECTED NOT DETECTED Final   Entamoeba histolytica NOT DETECTED NOT DETECTED Final   Giardia lamblia NOT DETECTED NOT DETECTED Final   Adenovirus F40/41 NOT DETECTED NOT DETECTED Final   Astrovirus NOT DETECTED NOT DETECTED Final   Norovirus GI/GII NOT DETECTED NOT DETECTED Final   Rotavirus A NOT DETECTED NOT DETECTED Final   Sapovirus (I, II, IV, and V) NOT DETECTED NOT DETECTED Final    Radiology Reports Ct Abdomen Pelvis W Contrast  Result Date: 06/10/2017 CLINICAL DATA:  Increasing diarrhea.  Abdominal pain. EXAM: CT ABDOMEN AND PELVIS WITH CONTRAST TECHNIQUE: Multidetector CT imaging of the abdomen and pelvis was performed using the standard protocol following bolus administration of intravenous contrast. CONTRAST:  181m ISOVUE-300 IOPAMIDOL (ISOVUE-300) INJECTION 61% COMPARISON:  01/04/2017 FINDINGS: Lower chest: Chronic appearing atelectasis, bronchiectasis and tree-in-bud nodularity identified within bilateral lower lung zones. No pleural effusion. Hepatobiliary: No focal liver abnormality identified. The gallbladder appears within normal limits.  There is mild chronic appearing intrahepatic biliary dilatation. The appearance is unchanged from previous exam. The CBD measures up to 9 mm. No obstructing stone identified. Pancreas: The pancreatic duct appears increased in caliber measuring up to 6 mm. Similar to  previous exam. No mass identified. No inflammation Spleen: Spleen is normal. Adrenals/Urinary Tract: The adrenal glands are unremarkable. 3.7 cm lower pole kidney cyst is identified. No kidney mass or hydronephrosis identified. Left kidney cyst measures 1.2 cm. The urinary bladder is partially obscured by beam hardening artifact from right hip arthroplasty device. Stomach/Bowel: Upper GI anatomy is complex. The patient has undergone a Billroth II gastrojejunostomy. There is off marked inflammatory changes surrounding the gastrojejunostomy site. Ulcer crater, described on endoscopy from 04/15/2017 may still be present measuring approximately 2 cm, image 41 of series 6. Surrounding inflammatory changes involve the transverse colon resulting secondary colitis with wall edema and fat stranding. No pathologic dilatation of the small or large bowel loops identified. Vascular/Lymphatic: Aortic atherosclerosis peer no aneurysm. No adenopathy identified within the upper abdomen. No pelvic or inguinal adenopathy. Reproductive: Uterus is not well visualized.  No adnexal mass noted. Other: None Musculoskeletal: Previous right hip arthroplasty. Scoliosis and degenerative disc disease identified IMPRESSION: 1. Complex upper GI anatomy status post Billroth II gastrojejunostomy. 2. There are persistent inflammatory changes at the gastrojejunostomy site as demonstrated on upper endoscopy from 04/15/2017. Persistent ulceration within this area is suspected. No free intraperitoneal air identified. 3. Secondary inflammatory changes are noted involving the transverse colon which may explain patient's worsening diarrhea and abdominal pain. No significant free fluid or fluid collections identified. 4. Chronic postinflammatory changes within both lower lung zones compatible with indolent atypical infection such as mycobacterium avium complex. 5. Unchanged chronic dilatation of the common bile and pancreatic ducts. Electronically Signed    By: Kerby Moors M.D.   On: 06/10/2017 17:40    Time Spent in minutes  30   Lala Lund M.D on 06/23/2017 at 1:59 PM  Between 7am to 7pm - Pager - [phone] ( page via [email or web address], text pages only, please mention full 10 digit call back number). After 7pm go to [email or web address] - password [facility]

## 2017-06-24 DIAGNOSIS — I10 Essential (primary) hypertension: Secondary | ICD-10-CM

## 2017-06-24 LAB — BASIC METABOLIC PANEL
Anion gap: 7 (ref 5–15)
BUN: 12 mg/dL (ref 6–20)
CHLORIDE: 107 mmol/L (ref 101–111)
CO2: 25 mmol/L (ref 22–32)
CREATININE: 0.67 mg/dL (ref 0.44–1.00)
Calcium: 8.4 mg/dL — ABNORMAL LOW (ref 8.9–10.3)
GFR calc Af Amer: >60 mL/min (ref >60)
GFR calc non Af Amer: >60 mL/min (ref >60)
Glucose, Bld: 86 mg/dL (ref 65–99)
Potassium: 3.7 mmol/L (ref 3.5–5.1)
SODIUM: 139 mmol/L (ref 135–145)

## 2017-06-24 LAB — CBC
HCT: 30.3 % — ABNORMAL LOW (ref 36.0–46.0)
HEMOGLOBIN: 9.5 g/dL — AB (ref 12.0–15.0)
MCH: 25.4 pg — AB (ref 26.0–34.0)
MCHC: 31.4 g/dL (ref 30.0–36.0)
MCV: 81 fL (ref 78.0–100.0)
PLATELETS: 531 10*3/uL — AB (ref 150–400)
RBC: 3.74 MIL/uL — ABNORMAL LOW (ref 3.87–5.11)
RDW: 15.1 % (ref 11.5–15.5)
WBC: 8.4 10*3/uL (ref 4.0–10.5)

## 2017-06-24 MED ORDER — ENSURE ENLIVE PO LIQD
237.0000 mL | Freq: Two times a day (BID) | ORAL | Status: DC
  Administered 2017-06-24 – 2017-06-27 (×4): 237 mL via ORAL

## 2017-06-24 MED ORDER — LOPERAMIDE HCL 2 MG PO CAPS
4.0000 mg | ORAL_CAPSULE | Freq: Four times a day (QID) | ORAL | Status: AC
  Administered 2017-06-24 (×3): 4 mg via ORAL
  Filled 2017-06-24 (×3): qty 2

## 2017-06-24 NOTE — Progress Notes (Signed)
Initial Nutrition Assessment  DOCUMENTATION CODES:   Severe malnutrition in context of chronic illness, Underweight  INTERVENTION:   -Provide Ensure Enlive po BID, each supplement provides 350 kcal and 20 grams of protein -RD will continue to monitor  NUTRITION DIAGNOSIS:   Malnutrition(severe) related to chronic illness (COPD) as evidenced by percent weight loss, energy intake < or equal to 75% for > or equal to 1 month, severe depletion of body fat, severe depletion of muscle mass.  GOAL:   Patient will meet greater than or equal to 90% of their needs  MONITOR:   PO intake, Supplement acceptance, Labs, Weight trends, I & O's  REASON FOR ASSESSMENT:   Consult Assessment of nutrition requirement/status  ASSESSMENT:   83 y.o. female with medical history significant of hypothyroidism, PUD s/p billroth II gastrojejunostoy, reflux, esophagitis, pyloric stenosis, history of Nissen fundoplication, diverticulosis, with recent admission for an upper GI bleed secondary to a gastrojejunal anastomotic ulcer which was felt to be likely NSAID related who is presenting with 3 weeks of diarrhea.  Patient has been experiencing diarrhea for 3 weeks PTA. Pt ate only 1 meal yesterday. Has already ordered all meals for today. Per MD note, plan for flex sigmoidoscopy tomorrow, 10/15.   Noted severe malnutrition status which was diagnosed during previous admission by nutrition staff. Malnutrition status has worsened given weight loss from chronic diarrhea.  Per chart review, pt has lost 17 lb since 7/28 (18% wt loss x 2.5 months, significant for time frame). Nutrition-Focused physical exam completed. Findings are severe fat depletion, severe muscle depletion, and none edema.   Labs reviewed. Medications: Multivitamin with minerals daily, Protonix tablet BID  Diet Order:  Diet regular Room service appropriate? Yes; Fluid consistency: Thin  Skin:  Reviewed, no issues  Last BM:  10/13  Height:    Ht Readings from Last 1 Encounters:  06/22/17 5' (1.524 m)    Weight:   Wt Readings from Last 1 Encounters:  06/22/17 77 lb 9.6 oz (35.2 kg)    Ideal Body Weight:  45.5 kg  BMI:  Body mass index is 15.16 kg/m.  Estimated Nutritional Needs:   Kcal:  1200-1400  Protein:  50-60g  Fluid:  1.4L/day  EDUCATION NEEDS:   No education needs identified at this time  Tilda Franco, MS, RD, LDN [facility] Inpatient Clinical Dietitian Pager: [phone] After Hours Pager: [phone]

## 2017-06-24 NOTE — Progress Notes (Signed)
_0 @        PROGRESS NOTE                                                                                                                                                                                                             Patient Demographics:    Katherine Flynn, is a 83 y.o. female, DOB - 1923-02-26, QIO:[id number]  Admit date - 06/22/2017   Admitting Physician A Melven Sartorius., MD  Outpatient Primary MD for the patient is Marletta Lor, MD  LOS - 1  No chief complaint on file.      Brief Narrative  Katherine Flynn is a 83 y.o. female with medical history significant of hypothyroidism, PUD s/p billroth II gastrojejunostoy, reflux, esophagitis, pyloric stenosis, history of Nissen fundoplication, diverticulosis, with recent admission for an upper GI bleed secondary to a gastrojejunal anastomotic ulcer which was felt to be likely NSAID related who is presenting with 3 weeks of diarrhea.   Subjective:   Patient in bed, appears comfortable, denies any headache, no fever, no chest pain or pressure, no shortness of breath , no abdominal pain. No focal weakness.    Assessment  & Plan :     1.Subacute to chronic diarrhea ongoing now for 3 weeks. No clear etiology, was negative for C. difficile on 06/14/2017 and GI office, GI pathogen panel negative, lactoferrin is negative as well, will give her 3 doses of scheduled Imodium gain today, discussed with gastroenterologist on call Dr. Collene Mares who is planning for a sigmoidoscopy on 06/25/2017 to rule out any inflammation or ischemia, untilthen continue supportive care, clinically looks stable.  2. Gastric bypass surgery with GJ anastomotic ulcer. Continue twice a day PPI and monitor clinically.  3. Hypokalemia. Replaced and stable.  4. Anemia of chronic and iron deficiency. Continue to monitor no need for transfusion. Once better place on oral iron.  5. UTI with leukocytosis. Finished 3 days of Rocephin.  6.  Unintentional weight loss due to diarrhea. Monitor, once diarrhea but her protein supplementation.  7. Possible MAC infection noted on CT scan. Supportive care, not a candidate for multiple antibiotics at this time.    Diet : Diet regular Room service appropriate? Yes; Fluid consistency: Thin    Family Communication  :  None  Code Status :  DNR  Disposition Plan  :  SNF 1-2 days  Consults  :  GI Dr Collene Mares   Procedures  :    CT - : 1. Complex upper GI anatomy status post Billroth  II gastrojejunostomy. 2. There are persistent inflammatory changes at the gastrojejunostomy site as demonstrated on upper endoscopy from 04/15/2017. Persistent ulceration within this area is suspected. No free intraperitoneal air identified. 3. Secondary inflammatory changes are noted involving the transverse colon which may explain patient's worsening diarrhea and abdominal pain. No significant free fluid or fluid collections identified. 4. Chronic postinflammatory changes within both lower lung zones compatible with indolent atypical infection such as mycobacterium avium complex. 5. Unchanged chronic dilatation of the common bile and pancreatic ducts.   DVT Prophylaxis  :   SCDs    Lab Results  Component Value Date   PLT 531 (H) 06/24/2017    Inpatient Medications  Scheduled Meds: . levothyroxine  100 mcg Oral QAC breakfast  . metoprolol tartrate  25 mg Oral BID  . multivitamin with minerals  1 tablet Oral Daily  . pantoprazole  40 mg Oral BID   Continuous Infusions: . cefTRIAXone (ROCEPHIN)  IV Stopped (06/23/17 2125)   PRN Meds:.acetaminophen, loperamide  Antibiotics  :    Anti-infectives    Start     Dose/Rate Route Frequency Ordered Stop   06/22/17 2000  cefTRIAXone (ROCEPHIN) 1 g in dextrose 5 % 50 mL IVPB     1 g 100 mL/hr over 30 Minutes Intravenous Every 24 hours 06/22/17 1841 06/25/17 1959         Objective:   Vitals:   06/23/17 0647 06/23/17 1411 06/23/17 2042 06/24/17 0519   BP: (!) 143/62 (!) 117/52 (!) 125/45 (!) 134/53  Pulse: 77 64 90 63  Resp:  _0 Temp: 98.4 F (36.9 C) 98.3 F (36.8 C) 97.7 F (36.5 C) 98 F (36.7 C)  TempSrc: Oral Axillary Oral Oral  SpO2: 99% 97% 100% 98%  Weight:      Height:        Wt Readings from Last 3 Encounters:  06/22/17 35.2 kg (77 lb 9.6 oz)  06/12/17 35.8 kg (79 lb)  06/10/17 39.5 kg (87 lb)     Intake/Output Summary (Last 24 hours) at 06/24/17 1032 Last data filed at 06/24/17 0600  Gross per 24 hour  Intake              290 ml  Output                0 ml  Net              290 ml     Physical Exam  Extremely frail elderly white female lying in hospital bed in no distress, No new F.N deficits, [place].AT,PERRAL Supple Neck,No JVD, No cervical lymphadenopathy appriciated.  Symmetrical Chest wall movement, Good air movement bilaterally, CTAB RRR,No Gallops,Rubs or new Murmurs, No Parasternal Heave +ve B.Sounds, Abd Soft, mild generalized ache all over on deep palpation, No organomegaly appriciated, No rebound - guarding or rigidity. No Cyanosis, Clubbing or edema, No new Rash or bruise     Data Review:    CBC  Recent Labs Lab 06/22/17 1135 06/23/17 0518 06/24/17 0541  WBC 13.6* 13.1* 8.4  HGB 10.6* 9.6* 9.5*  HCT 33.4* 30.7* 30.3*  PLT 517* 542* 531*  MCV 80.7 81.2 81.0  MCH 25.6* 25.4* 25.4*  MCHC 31.7 31.3 31.4  RDW 14.8 15.2 15.1  LYMPHSABS 0.9  --   --   MONOABS 0.6  --   --   EOSABS 0.1  --   --   BASOSABS 0.0  --   --     Chemistries  Recent Labs Lab 06/22/17 1135 06/23/17 0518 06/24/17 0541  NA 138 138 139  K 2.8* 3.8 3.7  CL 99* 106 107  CO2 _0 GLUCOSE 124* 142* 86  BUN _1 CREATININE 0.74 0.66 0.67  CALCIUM 9.1 8.5* 8.4*  MG 2.0  --   --   AST 22 21  --   ALT 12* 14  --   ALKPHOS 105 101  --   BILITOT 0.3 0.4  --    ------------------------------------------------------------------------------------------------------------------ No results  for input(s): CHOL, HDL, LDLCALC, TRIG, CHOLHDL, LDLDIRECT in the last 72 hours.  Lab Results  Component Value Date   HGBA1C 5.6 04/19/2015   ------------------------------------------------------------------------------------------------------------------ No results for input(s): TSH, T4TOTAL, T3FREE, THYROIDAB in the last 72 hours.  Invalid input(s): FREET3 ------------------------------------------------------------------------------------------------------------------  Recent Labs  06/23/17 0518  VITAMINB12 1,880*  FOLATE 30.0  FERRITIN 40  TIBC 256  IRON 9*    Coagulation profile No results for input(s): INR, PROTIME in the last 168 hours.  No results for input(s): DDIMER in the last 72 hours.  Cardiac Enzymes No results for input(s): CKMB, TROPONINI, MYOGLOBIN in the last 168 hours.  Invalid input(s): CK ------------------------------------------------------------------------------------------------------------------ No results found for: BNP  Micro Results Recent Results (from the past 240 hour(s))  Clostridium Difficile by PCR     Status: None   Collection Time: 06/14/17  1:00 PM  Result Value Ref Range Status   Toxigenic C Difficile by pcr NOT DETECTED NOT DETECT Final    Comment: . This test is for use only with liquid or soft stools;  performance characteristics of other clinical specimen  types have not been established. . This assay was performed by Cepheid GeneXpert(R) PCR. The performance characteristics of this assay have been determined by [employer]. Performance characteristics refer to the analytical performance of the test. . For additional information, please refer to http://education.[facility].com/faq/FAQ136 (This link is being provided for informational/educational purposes only.)   Gastrointestinal Panel by PCR , Stool     Status: None   Collection Time: 06/22/17  3:02 PM  Result Value Ref Range Status   Campylobacter  species NOT DETECTED NOT DETECTED Final   Plesimonas shigelloides NOT DETECTED NOT DETECTED Final   Salmonella species NOT DETECTED NOT DETECTED Final   Yersinia enterocolitica NOT DETECTED NOT DETECTED Final   Vibrio species NOT DETECTED NOT DETECTED Final   Vibrio cholerae NOT DETECTED NOT DETECTED Final   Enteroaggregative E coli (EAEC) NOT DETECTED NOT DETECTED Final   Enteropathogenic E coli (EPEC) NOT DETECTED NOT DETECTED Final   Enterotoxigenic E coli (ETEC) NOT DETECTED NOT DETECTED Final   Shiga like toxin producing E coli (STEC) NOT DETECTED NOT DETECTED Final   Shigella/Enteroinvasive E coli (EIEC) NOT DETECTED NOT DETECTED Final   Cryptosporidium NOT DETECTED NOT DETECTED Final   Cyclospora cayetanensis NOT DETECTED NOT DETECTED Final   Entamoeba histolytica NOT DETECTED NOT DETECTED Final   Giardia lamblia NOT DETECTED NOT DETECTED Final   Adenovirus F40/41 NOT DETECTED NOT DETECTED Final   Astrovirus NOT DETECTED NOT DETECTED Final   Norovirus GI/GII NOT DETECTED NOT DETECTED Final   Rotavirus A NOT DETECTED NOT DETECTED Final   Sapovirus (I, II, IV, and V) NOT DETECTED NOT DETECTED Final    Radiology Reports Ct Abdomen Pelvis W Contrast  Result Date: 06/10/2017 CLINICAL DATA:  Increasing diarrhea.  Abdominal pain. EXAM: CT ABDOMEN AND PELVIS WITH CONTRAST TECHNIQUE: Multidetector CT imaging of the abdomen and pelvis was performed using  the standard protocol following bolus administration of intravenous contrast. CONTRAST:  170m ISOVUE-300 IOPAMIDOL (ISOVUE-300) INJECTION 61% COMPARISON:  01/04/2017 FINDINGS: Lower chest: Chronic appearing atelectasis, bronchiectasis and tree-in-bud nodularity identified within bilateral lower lung zones. No pleural effusion. Hepatobiliary: No focal liver abnormality identified. The gallbladder appears within normal limits. There is mild chronic appearing intrahepatic biliary dilatation. The appearance is unchanged from previous exam. The CBD  measures up to 9 mm. No obstructing stone identified. Pancreas: The pancreatic duct appears increased in caliber measuring up to 6 mm. Similar to previous exam. No mass identified. No inflammation Spleen: Spleen is normal. Adrenals/Urinary Tract: The adrenal glands are unremarkable. 3.7 cm lower pole kidney cyst is identified. No kidney mass or hydronephrosis identified. Left kidney cyst measures 1.2 cm. The urinary bladder is partially obscured by beam hardening artifact from right hip arthroplasty device. Stomach/Bowel: Upper GI anatomy is complex. The patient has undergone a Billroth II gastrojejunostomy. There is off marked inflammatory changes surrounding the gastrojejunostomy site. Ulcer crater, described on endoscopy from 04/15/2017 may still be present measuring approximately 2 cm, image 41 of series 6. Surrounding inflammatory changes involve the transverse colon resulting secondary colitis with wall edema and fat stranding. No pathologic dilatation of the small or large bowel loops identified. Vascular/Lymphatic: Aortic atherosclerosis peer no aneurysm. No adenopathy identified within the upper abdomen. No pelvic or inguinal adenopathy. Reproductive: Uterus is not well visualized.  No adnexal mass noted. Other: None Musculoskeletal: Previous right hip arthroplasty. Scoliosis and degenerative disc disease identified IMPRESSION: 1. Complex upper GI anatomy status post Billroth II gastrojejunostomy. 2. There are persistent inflammatory changes at the gastrojejunostomy site as demonstrated on upper endoscopy from 04/15/2017. Persistent ulceration within this area is suspected. No free intraperitoneal air identified. 3. Secondary inflammatory changes are noted involving the transverse colon which may explain patient's worsening diarrhea and abdominal pain. No significant free fluid or fluid collections identified. 4. Chronic postinflammatory changes within both lower lung zones compatible with indolent atypical  infection such as mycobacterium avium complex. 5. Unchanged chronic dilatation of the common bile and pancreatic ducts. Electronically Signed   By: TKerby MoorsM.D.   On: 06/10/2017 17:40    Time Spent in minutes  30   PLala LundM.D on 06/24/2017 at 10:32 AM  Between 7am to 7pm - Pager - [phone]( page via [place], text pages only, please mention full 10 digit call back number). After 7pm go to [email or web address] - password [email or web address]

## 2017-06-24 NOTE — Progress Notes (Signed)
Cross cover [facility] Subjective: Since I last evaluated the patient, her condition is not much different. She continue to have several loose BM's per day; she has had 5 BM's since 7 pm tonite. She denies having any abdominal pain, melena or hematochezia. Wants to eat.  Objective: Vital signs in last 24 hours: Temp:  [97.7 F (36.5 C)-98.3 F (36.8 C)] 98 F (36.7 C) (10/14 0519) Pulse Rate:  [63-90] 63 (10/14 0519) Resp:  [17-20] 17 (10/14 0519) BP: (117-134)/(45-53) 134/53 (10/14 0519) SpO2:  [97 %-100 %] 98 % (10/14 0519) Last BM Date: 06/23/17  Intake/Output from previous day: 10/13 0701 - 10/14 0700 In: 581.7 [P.O.:360; I.V.:171.7; IV Piggyback:50] Out: -  Intake/Output this shift: No intake/output data recorded.  General appearance: alert, cooperative, appears older than stated age, cachectic, distracted, fatigued, mild distress and slowed mentation Resp: clear to auscultation bilaterally Cardio: regular rate and rhythm, S1, S2 normal, no murmur, click, rub or gallop GI: soft, non-tender; bowel sounds normal; no masses,  no organomegaly  Lab Results:  Recent Labs  06/22/17 1135 06/23/17 0518 06/24/17 0541  WBC 13.6* 13.1* 8.4  HGB 10.6* 9.6* 9.5*  HCT 33.4* 30.7* 30.3*  PLT 517* 542* 531*   BMET  Recent Labs  06/22/17 1135 06/23/17 0518 06/24/17 0541  NA 138 138 139  K 2.8* 3.8 3.7  CL 99* 106 107  CO2 26 22 25  GLUCOSE 124* 142* 86  BUN 15 14 12  CREATININE 0.74 0.66 0.67  CALCIUM 9.1 8.5* 8.4*   LFT  Recent Labs  06/23/17 0518  PROT 6.2*  ALBUMIN 2.7*  AST 21  ALT 14  ALKPHOS 101  BILITOT 0.4   Medications: I have reviewed the patient's current medications.  Assessment/Plan: 1) Diarrhea for 3 weeks with weight loss; abnormal CT scan-workup of the diarrhea has been  negative; plan to do a flex sig vs colon tomorrow to see if there is any evidence of colitis as noted on her CT scan.  2) History of PUS s/p Billroth II gastrojejunostomy with  history anastomotic ulcers.  3) Iron deficiency anemia.   LOS: 1 day   Jakaylah Schlafer 06/24/2017, 12:22 PM   

## 2017-06-25 ENCOUNTER — Encounter (HOSPITAL_COMMUNITY): Payer: Self-pay | Admitting: *Deleted

## 2017-06-25 ENCOUNTER — Inpatient Hospital Stay (HOSPITAL_COMMUNITY): Payer: Medicare HMO | Admitting: Anesthesiology

## 2017-06-25 ENCOUNTER — Encounter (HOSPITAL_COMMUNITY)
Admission: EM | Disposition: A | Discharge status: Skilled Nursing Facility | Payer: Self-pay | Source: Home / Self Care | Attending: Internal Medicine

## 2017-06-25 DIAGNOSIS — K5730 Diverticulosis of large intestine without perforation or abscess without bleeding: Secondary | ICD-10-CM

## 2017-06-25 DIAGNOSIS — K52832 Lymphocytic colitis: Secondary | ICD-10-CM

## 2017-06-25 DIAGNOSIS — R1030 Lower abdominal pain, unspecified: Secondary | ICD-10-CM

## 2017-06-25 HISTORY — PX: FLEXIBLE SIGMOIDOSCOPY: SHX5431

## 2017-06-25 SURGERY — SIGMOIDOSCOPY, FLEXIBLE
Anesthesia: Monitor Anesthesia Care

## 2017-06-25 MED ORDER — PROPOFOL 10 MG/ML IV BOLUS
INTRAVENOUS | Status: AC
  Filled 2017-06-25: qty 40

## 2017-06-25 MED ORDER — PROPOFOL 10 MG/ML IV BOLUS
INTRAVENOUS | Status: DC | PRN
  Administered 2017-06-25 (×3): 20 mg via INTRAVENOUS

## 2017-06-25 MED ORDER — LACTATED RINGERS IV SOLN: INTRAVENOUS | Status: DC

## 2017-06-25 MED ORDER — PSYLLIUM 95 % PO PACK
1.0000 | PACK | Freq: Every day | ORAL | Status: DC
  Administered 2017-06-25 – 2017-06-26 (×2): 1 via ORAL
  Filled 2017-06-25 (×5): qty 1

## 2017-06-25 MED ORDER — LIDOCAINE 2% (20 MG/ML) 5 ML SYRINGE
INTRAMUSCULAR | Status: DC | PRN
  Administered 2017-06-25: 40 mg via INTRAVENOUS

## 2017-06-25 MED ORDER — LACTATED RINGERS IV SOLN
INTRAVENOUS | Status: DC | PRN
  Administered 2017-06-25: 15:00:00 via INTRAVENOUS

## 2017-06-25 MED ORDER — SODIUM CHLORIDE 0.9 % IV SOLN: INTRAVENOUS | Status: DC

## 2017-06-25 MED ORDER — SUCRALFATE 1 GM/10ML PO SUSP
1.0000 g | Freq: Four times a day (QID) | ORAL | Status: DC
  Administered 2017-06-25 – 2017-06-29 (×16): 1 g via ORAL
  Filled 2017-06-25 (×16): qty 10

## 2017-06-25 NOTE — Anesthesia Procedure Notes (Signed)
Date/Time: 06/25/2017 3:16 PM Performed by: Minerva Ends Pre-anesthesia Checklist: Patient identified, Emergency Drugs available, Suction available, Patient being monitored and Timeout performed Patient Re-evaluated:Patient Re-evaluated prior to induction Oxygen Delivery Method: Simple face mask Placement Confirmation: positive ETCO2 and breath sounds checked- equal and bilateral Dental Injury: Teeth and Oropharynx as per pre-operative assessment  Comments: Face mask for sedation

## 2017-06-25 NOTE — Progress Notes (Signed)
OT Cancellation Note  Patient Details Name: [name] MRN: [id number] DOB: May 09, 1923   Cancelled Treatment:    Reason Eval/Treat Not Completed: Patient at procedure or test/ unavailable  Devin Ganaway, [place] Kung 06/25/2017, 2:05 PM

## 2017-06-25 NOTE — Anesthesia Postprocedure Evaluation (Signed)
Anesthesia Post Note  Patient: Katherine Flynn  Procedure(s) Performed: FLEXIBLE SIGMOIDOSCOPY (N/A )     Patient location during evaluation: PACU Anesthesia Type: MAC Level of consciousness: awake and alert Pain management: pain level controlled Vital Signs Assessment: post-procedure vital signs reviewed and stable Respiratory status: spontaneous breathing, nonlabored ventilation and respiratory function stable Cardiovascular status: stable and blood pressure returned to baseline Anesthetic complications: no    Last Vitals:  Vitals:   06/25/17 1550 06/25/17 1600  BP: (!) 132/51 (!) 130/47  Pulse: 60 62  Resp: 19 14  Temp:    SpO2: 100% 100%    Last Pain:  Vitals:   06/25/17 1538  TempSrc: Oral  PainSc:                  Audry Pili

## 2017-06-25 NOTE — Interval H&P Note (Signed)
History and Physical Interval Note:  06/25/2017 3:11 PM  Katherine Flynn  has presented today for surgery, with the diagnosis of Diarrhea, IDA, Abnormal weight loss  The various methods of treatment have been discussed with the patient and family. After consideration of risks, benefits and other options for treatment, the patient has consented to  Procedure(s): FLEXIBLE SIGMOIDOSCOPY (N/A) as a surgical intervention .  The patient's history has been reviewed, patient examined, no change in status, stable for surgery.  I have reviewed the patient's chart and labs.  Questions were answered to the patient's satisfaction.     Rachael Fee

## 2017-06-25 NOTE — Progress Notes (Signed)
_0 @        PROGRESS NOTE                                                                                                                                                                                                             Patient Demographics:    Katherine Flynn, is a 83 y.o. female, DOB - 1923/09/11, OEU:[id number]  Admit date - 06/22/2017   Admitting Physician A Melven Sartorius., MD  Outpatient Primary MD for the patient is Marletta Lor, MD  LOS - 2  No chief complaint on file.      Brief Narrative  Katherine Flynn is a 83 y.o. female with medical history significant of hypothyroidism, PUD s/p billroth II gastrojejunostoy, reflux, esophagitis, pyloric stenosis, history of Nissen fundoplication, diverticulosis, with recent admission for an upper GI bleed secondary to a gastrojejunal anastomotic ulcer which was felt to be likely NSAID related who is presenting with 3 weeks of diarrhea.   Subjective:   Patient in bed, appears comfortable, denies any headache, no fever, no chest pain or pressure, no shortness of breath , mild periapical abdominal pain. No focal weakness.   Assessment  & Plan :     1. Subacute to chronic diarrhea ongoing now for 3 weeks. No clear etiology, was negative for C. difficile on 06/14/2017 and GI office, GI pathogen panel negative, lactoferrin is negative as well, Currently on when necessary Imodium, GI on board planning to do flexible sigmoidoscopy on 06/25/2017 as there was mild inflammation noted on CT scan in the colon area, question ischemia versus inflammation.  2. Gastric bypass surgery with GJ anastomotic ulcer. Continue twice a day PPI and monitor clinically.  3. Hypokalemia. Replaced and stable.  4. Anemia of chronic and iron deficiency. Continue to monitor no need for transfusion. Once better place on oral iron.  5. UTI with leukocytosis. Finished 3 days of Rocephin.  6. Unintentional weight loss due to diarrhea.  Monitor, once diarrhea but her protein supplementation.  7. Possible MAC infection noted on CT scan. Supportive care, not a candidate for multiple antibiotics at this time.    Diet : Diet NPO time specified    Family Communication  :  Son in detail on 06/25/2017  Code Status :  DNR  Disposition Plan  :  Most likely SNF 1-2 days  Consults  :  GI Dr Collene Mares   Procedures  :    CT - : 1. Complex upper GI anatomy status post Billroth II gastrojejunostomy. 2. There are  persistent inflammatory changes at the gastrojejunostomy site as demonstrated on upper endoscopy from 04/15/2017. Persistent ulceration within this area is suspected. No free intraperitoneal air identified. 3. Secondary inflammatory changes are noted involving the transverse colon which may explain patient's worsening diarrhea and abdominal pain. No significant free fluid or fluid collections identified. 4. Chronic postinflammatory changes within both lower lung zones compatible with indolent atypical infection such as mycobacterium avium complex. 5. Unchanged chronic dilatation of the common bile and pancreatic ducts.   DVT Prophylaxis  :   SCDs    Lab Results  Component Value Date   PLT 531 (H) 06/24/2017    Inpatient Medications  Scheduled Meds: . feeding supplement (ENSURE ENLIVE)  237 mL Oral BID BM  . levothyroxine  100 mcg Oral QAC breakfast  . metoprolol tartrate  25 mg Oral BID  . multivitamin with minerals  1 tablet Oral Daily  . pantoprazole  40 mg Oral BID  . sucralfate  1 g Oral Q6H   Continuous Infusions:  PRN Meds:.acetaminophen, loperamide  Antibiotics  :    Anti-infectives    Start     Dose/Rate Route Frequency Ordered Stop   06/22/17 2000  cefTRIAXone (ROCEPHIN) 1 g in dextrose 5 % 50 mL IVPB     1 g 100 mL/hr over 30 Minutes Intravenous Every 24 hours 06/22/17 1841 06/25/17 0146         Objective:   Vitals:   06/24/17 0519 06/24/17 1419 06/24/17 1930 06/25/17 0652  BP: (!) 134/53 140/66  (!) 145/67 (!) 114/47  Pulse: 63 84 87 65  Resp: _0 Temp: 98 F (36.7 C) 98.9 F (37.2 C) 98.6 F (37 C) 98.4 F (36.9 C)  TempSrc: Oral Oral Oral Oral  SpO2: 98% 98% 97% 97%  Weight:      Height:        Wt Readings from Last 3 Encounters:  06/22/17 35.2 kg (77 lb 9.6 oz)  06/12/17 35.8 kg (79 lb)  06/10/17 39.5 kg (87 lb)     Intake/Output Summary (Last 24 hours) at 06/25/17 1058 Last data filed at 06/25/17 1000  Gross per 24 hour  Intake               50 ml  Output                0 ml  Net               50 ml     Physical Exam  Awake Alert, Oriented X 3, No new F.N deficits, Normal affect .AT,PERRAL Supple Neck,No JVD, No cervical lymphadenopathy appriciated.  Symmetrical Chest wall movement, Good air movement bilaterally, CTAB RRR,No Gallops,Rubs or new Murmurs, No Parasternal Heave +ve B.Sounds, Abd Soft, Abd Soft, mild generalized ache all over on deep palpation, No rebound - guarding or rigidity. No Cyanosis, Clubbing or edema, No new Rash or bruise     Data Review:    CBC  Recent Labs Lab 06/22/17 1135 06/23/17 0518 06/24/17 0541  WBC 13.6* 13.1* 8.4  HGB 10.6* 9.6* 9.5*  HCT 33.4* 30.7* 30.3*  PLT 517* 542* 531*  MCV 80.7 81.2 81.0  MCH 25.6* 25.4* 25.4*  MCHC 31.7 31.3 31.4  RDW 14.8 15.2 15.1  LYMPHSABS 0.9  --   --   MONOABS 0.6  --   --   EOSABS 0.1  --   --   BASOSABS 0.0  --   --  Chemistries   Recent Labs Lab 06/22/17 1135 06/23/17 0518 06/24/17 0541  NA 138 138 139  K 2.8* 3.8 3.7  CL 99* 106 107  CO2 _0 GLUCOSE 124* 142* 86  BUN _1 CREATININE 0.74 0.66 0.67  CALCIUM 9.1 8.5* 8.4*  MG 2.0  --   --   AST 22 21  --   ALT 12* 14  --   ALKPHOS 105 101  --   BILITOT 0.3 0.4  --    ------------------------------------------------------------------------------------------------------------------ No results for input(s): CHOL, HDL, LDLCALC, TRIG, CHOLHDL, LDLDIRECT in the last 72 hours.  Lab  Results  Component Value Date   HGBA1C 5.6 04/19/2015   ------------------------------------------------------------------------------------------------------------------ No results for input(s): TSH, T4TOTAL, T3FREE, THYROIDAB in the last 72 hours.  Invalid input(s): FREET3 ------------------------------------------------------------------------------------------------------------------  Recent Labs  06/23/17 0518  VITAMINB12 1,880*  FOLATE 30.0  FERRITIN 40  TIBC 256  IRON 9*    Coagulation profile No results for input(s): INR, PROTIME in the last 168 hours.  No results for input(s): DDIMER in the last 72 hours.  Cardiac Enzymes No results for input(s): CKMB, TROPONINI, MYOGLOBIN in the last 168 hours.  Invalid input(s): CK ------------------------------------------------------------------------------------------------------------------ No results found for: BNP  Micro Results Recent Results (from the past 240 hour(s))  Gastrointestinal Panel by PCR , Stool     Status: None   Collection Time: 06/22/17  3:02 PM  Result Value Ref Range Status   Campylobacter species NOT DETECTED NOT DETECTED Final   Plesimonas shigelloides NOT DETECTED NOT DETECTED Final   Salmonella species NOT DETECTED NOT DETECTED Final   Yersinia enterocolitica NOT DETECTED NOT DETECTED Final   Vibrio species NOT DETECTED NOT DETECTED Final   Vibrio cholerae NOT DETECTED NOT DETECTED Final   Enteroaggregative E coli (EAEC) NOT DETECTED NOT DETECTED Final   Enteropathogenic E coli (EPEC) NOT DETECTED NOT DETECTED Final   Enterotoxigenic E coli (ETEC) NOT DETECTED NOT DETECTED Final   Shiga like toxin producing E coli (STEC) NOT DETECTED NOT DETECTED Final   Shigella/Enteroinvasive E coli (EIEC) NOT DETECTED NOT DETECTED Final   Cryptosporidium NOT DETECTED NOT DETECTED Final   Cyclospora cayetanensis NOT DETECTED NOT DETECTED Final   Entamoeba histolytica NOT DETECTED NOT DETECTED Final   Giardia  lamblia NOT DETECTED NOT DETECTED Final   Adenovirus F40/41 NOT DETECTED NOT DETECTED Final   Astrovirus NOT DETECTED NOT DETECTED Final   Norovirus GI/GII NOT DETECTED NOT DETECTED Final   Rotavirus A NOT DETECTED NOT DETECTED Final   Sapovirus (I, II, IV, and V) NOT DETECTED NOT DETECTED Final    Radiology Reports Ct Abdomen Pelvis W Contrast  Result Date: 06/10/2017 CLINICAL DATA:  Increasing diarrhea.  Abdominal pain. EXAM: CT ABDOMEN AND PELVIS WITH CONTRAST TECHNIQUE: Multidetector CT imaging of the abdomen and pelvis was performed using the standard protocol following bolus administration of intravenous contrast. CONTRAST:  157m ISOVUE-300 IOPAMIDOL (ISOVUE-300) INJECTION 61% COMPARISON:  01/04/2017 FINDINGS: Lower chest: Chronic appearing atelectasis, bronchiectasis and tree-in-bud nodularity identified within bilateral lower lung zones. No pleural effusion. Hepatobiliary: No focal liver abnormality identified. The gallbladder appears within normal limits. There is mild chronic appearing intrahepatic biliary dilatation. The appearance is unchanged from previous exam. The CBD measures up to 9 mm. No obstructing stone identified. Pancreas: The pancreatic duct appears increased in caliber measuring up to 6 mm. Similar to previous exam. No mass identified. No inflammation Spleen: Spleen is normal. Adrenals/Urinary Tract: The adrenal glands are unremarkable. 3.7 cm  lower pole kidney cyst is identified. No kidney mass or hydronephrosis identified. Left kidney cyst measures 1.2 cm. The urinary bladder is partially obscured by beam hardening artifact from right hip arthroplasty device. Stomach/Bowel: Upper GI anatomy is complex. The patient has undergone a Billroth II gastrojejunostomy. There is off marked inflammatory changes surrounding the gastrojejunostomy site. Ulcer crater, described on endoscopy from 04/15/2017 may still be present measuring approximately 2 cm, image 41 of series 6. Surrounding  inflammatory changes involve the transverse colon resulting secondary colitis with wall edema and fat stranding. No pathologic dilatation of the small or large bowel loops identified. Vascular/Lymphatic: Aortic atherosclerosis peer no aneurysm. No adenopathy identified within the upper abdomen. No pelvic or inguinal adenopathy. Reproductive: Uterus is not well visualized.  No adnexal mass noted. Other: None Musculoskeletal: Previous right hip arthroplasty. Scoliosis and degenerative disc disease identified IMPRESSION: 1. Complex upper GI anatomy status post Billroth II gastrojejunostomy. 2. There are persistent inflammatory changes at the gastrojejunostomy site as demonstrated on upper endoscopy from 04/15/2017. Persistent ulceration within this area is suspected. No free intraperitoneal air identified. 3. Secondary inflammatory changes are noted involving the transverse colon which may explain patient's worsening diarrhea and abdominal pain. No significant free fluid or fluid collections identified. 4. Chronic postinflammatory changes within both lower lung zones compatible with indolent atypical infection such as mycobacterium avium complex. 5. Unchanged chronic dilatation of the common bile and pancreatic ducts. Electronically Signed   By: Kerby Moors M.D.   On: 06/10/2017 17:40    Time Spent in minutes  30   Lala Lund M.D on 06/25/2017 at 10:58 AM  Between 7am to 7pm - Pager - [phone] ( page via [email or web address], text pages only, please mention full 10 digit call back number). After 7pm go to [email or web address] - password [email or web address]

## 2017-06-25 NOTE — Transfer of Care (Signed)
Immediate Anesthesia Transfer of Care Note  Patient: Katherine Flynn  Procedure(s) Performed: FLEXIBLE SIGMOIDOSCOPY (N/A )  Patient Location: PACU and Endoscopy Unit  Anesthesia Type:MAC  Level of Consciousness: sedated  Airway & Oxygen Therapy: Patient Spontanous Breathing and Patient connected to face mask oxygen  Post-op Assessment: Report given to RN and Post -op Vital signs reviewed and stable  Post vital signs: Reviewed and stable  Last Vitals:  Vitals:   06/25/17 0652 06/25/17 1418  BP: (!) 114/47 (!) 140/59  Pulse: 65 66  Resp: 18 16  Temp: 36.9 C 36.7 C  SpO2: 97% 97%    Last Pain:  Vitals:   06/25/17 1418  TempSrc: Oral  PainSc:       Patients Stated Pain Goal: 3 (16/10/96 0454)  Complications: No apparent anesthesia complications

## 2017-06-25 NOTE — Progress Notes (Signed)
Patient ID: Katherine Flynn, female   DOB: [birth year], 83 y.o.   MRN: [id number]     Progress Note   Subjective    No  c/o abdominal  Pain, or cramping Nursing reports 8-10 yellow liquid stools per day  Stool studies done last week negative for Cdiff ,lactoferrin , GI path panel also negative   Objective   Vital signs in last 24 hours: Temp:  [98.4 F (36.9 C)-98.9 F (37.2 C)] 98.4 F (36.9 C) (10/15 0652) Pulse Rate:  [65-87] 65 (10/15 0652) Resp:  [17-18] 18 (10/15 0652) BP: (114-145)/(47-67) 114/47 (10/15 0652) SpO2:  [97 %-98 %] 97 % (10/15 4098) Last BM Date: 06/24/17 General:   Frail small elderly WF in NAD Heart:  Regular rate and rhythm; no murmurs Lungs: Respirations even and unlabored, lungs CTA bilaterally Abdomen:  Soft, tender rather diffusely  and nondistended. Normal bowel sounds. Extremities:  Without edema. Neurologic:  Alert and oriented,  grossly normal neurologically. Psych:  Cooperative. Normal mood and affect.  Intake/Output from previous day: No intake/output data recorded. Intake/Output this shift: No intake/output data recorded.  Lab Results:  Recent Labs  06/22/17 1135 06/23/17 0518 06/24/17 0541  WBC 13.6* 13.1* 8.4  HGB 10.6* 9.6* 9.5*  HCT 33.4* 30.7* 30.3*  PLT 517* 542* 531*   BMET  Recent Labs  06/22/17 1135 06/23/17 0518 06/24/17 0541  NA 138 138 139  K 2.8* 3.8 3.7  CL 99* 106 107  CO2 GLUCOSE 124* 142* 86  BUN CREATININE 0.74 0.66 0.67  CALCIUM 9.1 8.5* 8.4*   LFT  Recent Labs  06/23/17 0518  PROT 6.2*  ALBUMIN 2.7*  AST 21  ALT 14  ALKPHOS 101  BILITOT 0.4   PT/INR No results for input(s): LABPROT, INR in the last 72 hours.       Assessment / Plan:    #1 83 yo female with several week hx of diarrhea , with wt loss, hypokalemia- etiology not clear  Infectious workup negative as of last week   R/O acute inflammatory colitis ( abnormal CT at transverse colon )  R/O  microscopic colitis. R/O Cdiff despite negative toxin  #2 persistent  Anastomotic ulcer  - s/p Bilroth - recent GI bleed  #3 anemia - acute on chronic    Plan; Pt is for Flex/Colon this afternoon with Dr Christella Hartigan- no prep - await findings Continue BID PPI   Continue Carafate  Liquid 1 gm QID - will restart after colon today      Contact  Amy Esterwood, P.A.-C               [phone]      Principal Problem:   Diarrhea Active Problems:   Hypothyroidism   Essential hypertension   GERD   PUD (peptic ulcer disease)   Abdominal pain   Pyuria     LOS: 2 days   Amy Esterwood  06/25/2017, 9:05 AM

## 2017-06-25 NOTE — H&P (View-Only) (Signed)
Cross cover [facility] Subjective: Since I last evaluated the patient, her condition is not much different. She continue to have several loose BM's per day; she has had 5 BM's since 7 pm tonite. She denies having any abdominal pain, melena or hematochezia. Wants to eat.  Objective: Vital signs in last 24 hours: Temp:  [97.7 F (36.5 C)-98.3 F (36.8 C)] 98 F (36.7 C) (10/14 0519) Pulse Rate:  [63-90] 63 (10/14 0519) Resp:  [17-20] 17 (10/14 0519) BP: (117-134)/(45-53) 134/53 (10/14 0519) SpO2:  [97 %-100 %] 98 % (10/14 0519) Last BM Date: 06/23/17  Intake/Output from previous day: 10/13 0701 - 10/14 0700 In: 581.7 [P.O.:360; I.V.:171.7; IV Piggyback:50] Out: -  Intake/Output this shift: No intake/output data recorded.  General appearance: alert, cooperative, appears older than stated age, cachectic, distracted, fatigued, mild distress and slowed mentation Resp: clear to auscultation bilaterally Cardio: regular rate and rhythm, S1, S2 normal, no murmur, click, rub or gallop GI: soft, non-tender; bowel sounds normal; no masses,  no organomegaly  Lab Results:  Recent Labs  06/22/17 1135 06/23/17 0518 06/24/17 0541  WBC 13.6* 13.1* 8.4  HGB 10.6* 9.6* 9.5*  HCT 33.4* 30.7* 30.3*  PLT 517* 542* 531*   BMET  Recent Labs  06/22/17 1135 06/23/17 0518 06/24/17 0541  NA 138 138 139  K 2.8* 3.8 3.7  CL 99* 106 107  CO2 GLUCOSE 124* 142* 86  BUN CREATININE 0.74 0.66 0.67  CALCIUM 9.1 8.5* 8.4*   LFT  Recent Labs  06/23/17 0518  PROT 6.2*  ALBUMIN 2.7*  AST 21  ALT 14  ALKPHOS 101  BILITOT 0.4   Medications: I have reviewed the patient's current medications.  Assessment/Plan: 1) Diarrhea for 3 weeks with weight loss; abnormal CT scan-workup of the diarrhea has been  negative; plan to do a flex sig vs colon tomorrow to see if there is any evidence of colitis as noted on her CT scan.  2) History of PUS s/p Billroth II gastrojejunostomy with  history anastomotic ulcers.  3) Iron deficiency anemia.   LOS: 1 day   [name] 06/24/2017, 12:22 PM

## 2017-06-25 NOTE — Anesthesia Preprocedure Evaluation (Addendum)
Anesthesia Evaluation  Patient identified by MRN, date of birth, ID band Patient awake    Reviewed: Allergy & Precautions, NPO status , Patient's Chart, lab work & pertinent test results, reviewed documented beta blocker date and time   Airway Mallampati: III  TM Distance: >3 FB Neck ROM: Full    Dental  (+) Edentulous Lower, Edentulous Upper   Pulmonary COPD,  COPD inhaler,    breath sounds clear to auscultation       Cardiovascular hypertension, Pt. on home beta blockers + dysrhythmias (RBBB)  Rhythm:Regular Rate:Normal  04/07/17 TTE: Mild LVH. Ejection fraction was in the range of 60% to 65%. Grade 1 diastolic dysfunction. Mild AI, mild-mod MR. The left atrium was mildly dilated. PA peak pressure was 40 mm Hg (S).   Neuro/Psych negative neurological ROS  negative psych ROS   GI/Hepatic Neg liver ROS, GERD  Medicated,melena and acute blood loss anemia Jejunal ulcer   Endo/Other  Hypothyroidism   Renal/GU negative Renal ROS     Musculoskeletal  (+) Arthritis ,   Abdominal   Peds  Hematology  (+) Blood dyscrasia, anemia ,   Anesthesia Other Findings Chronic pain syndrome  Reproductive/Obstetrics                            Anesthesia Physical  Anesthesia Plan  ASA: III  Anesthesia Plan: MAC   Post-op Pain Management:    Induction: Intravenous  PONV Risk Score and Plan: 2 and Treatment may vary due to age or medical condition and Propofol infusion  Airway Management Planned: Nasal Cannula  Additional Equipment: None  Intra-op Plan:   Post-operative Plan:   Informed Consent: I have reviewed the patients History and Physical, chart, labs and discussed the procedure including the risks, benefits and alternatives for the proposed anesthesia with the patient or authorized representative who has indicated his/her understanding and acceptance.     Plan Discussed with: CRNA and  Anesthesiologist  Anesthesia Plan Comments: (Discussed DNR status with son over phone. He is agreeable to CPR and intubation if necessary during the procedure. DNR reversed verbally for procedural period.)      Anesthesia Quick Evaluation

## 2017-06-25 NOTE — Op Note (Signed)
[facility] Patient Name: Katherine Flynn Procedure Date: 06/25/2017 MRN: [id number] Attending MD: Rachael Fee , MD Date of Birth: 07-17-1923 CSN: [id number] Age: 83 Admit Type: Inpatient Procedure:                Flexible Sigmoidoscopy Indications:              "Diarrhea" Providers:                Rachael Fee, MD, Priscella Mann RN, RN,                            Beryle Beams, Technician, Heron Nay, CRNA Referring MD:              Medicines:                Monitored Anesthesia Care Complications:            No immediate complications. Estimated blood loss:                            None. Estimated Blood Loss:     Estimated blood loss: none. Procedure:                Pre-Anesthesia Assessment:                           - Prior to the procedure, a History and Physical                            was performed, and patient medications and                            allergies were reviewed. The patient's tolerance of                            previous anesthesia was also reviewed. The risks                            and benefits of the procedure and the sedation                            options and risks were discussed with the patient.                            All questions were answered, and informed consent                            was obtained. Prior Anticoagulants: The patient has                            taken no previous anticoagulant or antiplatelet                            agents. ASA Grade Assessment: IV - A patient with  severe systemic disease that is a constant threat                            to life. After reviewing the risks and benefits,                            the patient was deemed in satisfactory condition to                            undergo the procedure.                           After obtaining informed consent, the scope was                            passed under direct vision. The  [id number] ([id number])                            scope was introduced through the anus and advanced                            to the the descending colon. The flexible                            sigmoidoscopy was accomplished without difficulty.                            The patient tolerated the procedure well. The                            quality of the bowel preparation was inadequate. Scope In: 3:20:14 PM Scope Out: 3:25:38 PM Total Procedure Duration: 0 hours 5 minutes 24 seconds  Findings:      There was solid, scibbolous appearing stools throughout the examined       colon (to about the proximal descending colon). Solid stool precluded       more proximal inspection.      Multiple small-mouthed diverticula were found in the sigmoid colon.      Biopsies were taken randomly from the left colon with a cold forceps for       histology.      The exam was otherwise without abnormality. Impression:               - Preparation of the colon was inadequate. There                            was solid, scibbolous stool throughout the examined                            colon.                           - Diverticulosis in the sigmoid colon.                           - The left colon  was randomly biopsied to check for                            microscopic colitis.                           - The examination was otherwise normal. Moderate Sedation:      N/A- Per Anesthesia Care Recommendation:           - Return patient to hospital ward for ongoing care.                           - Advance diet as tolerated.                           - Continue present medications.                           - Will start fiber supplements today- attempt to                            bulk her stools.                           - Pending her clinical course and the biopsy                            results she may need a fully prepped colonoscopy. Procedure Code(s):        --- Professional ---                            [phone], Sigmoidoscopy, flexible; with biopsy, single                            or multiple Diagnosis Code(s):        --- Professional ---                           R19.7, Diarrhea, unspecified                           K57.30, Diverticulosis of large intestine without                            perforation or abscess without bleeding CPT copyright 2016 [employer]. All rights reserved. The codes documented in this report are preliminary and upon coder review may  be revised to meet current compliance requirements. Rachael Fee, MD 06/25/2017 3:33:37 PM This report has been signed electronically. Number of Addenda: 0

## 2017-06-25 NOTE — Evaluation (Signed)
Physical Therapy Evaluation Patient Details Name: Katherine Flynn MRN: [id number] DOB: 14-Jun-1923 Today's Date: 06/25/2017   History of Present Illness  83 yo female admitted with diarrhea. Hx of anemia, multiple falls, low back pain, OA, COPD, GERD.   Clinical Impression  On eval, pt required Mod assist for mobility. She walked ~15 feet with a RW. She is weak and at risk for falls. Pt presents with general weakness, decreased activity tolerance, and impaired gait and balance. She is not safe to return home alone at this time. Recommend ST rehab at SNF. Will follow and progress activity as tolerated.     Follow Up Recommendations SNF    Equipment Recommendations  None recommended by PT    Recommendations for Other Services OT consult     Precautions / Restrictions Precautions Precautions: Fall Restrictions Weight Bearing Restrictions: No      Mobility  Bed Mobility Overal bed mobility: Needs Assistance Bed Mobility: Supine to Sit;Sit to Supine     Supine to sit: HOB elevated;Min assist Sit to supine: Mod assist;HOB elevated   General bed mobility comments: Assist for trunk and LEs. Increased time. Cues for safety, completion of task. Increased time. Pt c/o some lightheadedness.   Transfers Overall transfer level: Needs assistance Equipment used: Rolling walker (2 wheeled) Transfers: Sit to/from Stand Sit to Stand: Min assist         General transfer comment: Assist to rise, stabilize, control descent. VCs safety, technique, hand placement.   Ambulation/Gait Ambulation/Gait assistance: Min assist Ambulation Distance (Feet): 15 Feet (x2) Assistive device: Rolling walker (2 wheeled) Gait Pattern/deviations: Step-through pattern;Trunk flexed;Narrow base of support;Decreased stride length     General Gait Details: Assist to stabilize pt and maneuver with RW. Slow gait speed. VCs safety. Pt fatigues easily and has an unsteady gait.   Stairs             Wheelchair Mobility    Modified Rankin (Stroke Patients Only)       Balance Overall balance assessment: Needs assistance;History of Falls         Standing balance support: Bilateral upper extremity supported Standing balance-Leahy Scale: Poor                               Pertinent Vitals/Pain Pain Assessment: No/denies pain    Home Living Family/patient expects to be discharged to:: Private residence Living Arrangements: Alone Available Help at Discharge: Personal care attendant (in the mornings) Type of Home: House Home Access: Stairs to enter Entrance Stairs-Rails: Right Entrance Stairs-Number of Steps: 3-4 Home Layout: One level        Prior Function Level of Independence: Needs assistance   Gait / Transfers Assistance Needed: Uses RW for ambulation  ADL's / Homemaking Assistance Needed: per pt, she has an aide in the mornings        Hand Dominance        Extremity/Trunk Assessment   Upper Extremity Assessment Upper Extremity Assessment: Generalized weakness    Lower Extremity Assessment Lower Extremity Assessment: Generalized weakness    Cervical / Trunk Assessment Cervical / Trunk Assessment: Kyphotic  Communication   Communication: No difficulties  Cognition Arousal/Alertness: Awake/alert Behavior During Therapy: WFL for tasks assessed/performed Overall Cognitive Status: Impaired/Different from baseline Area of Impairment: Memory;Problem solving                     Memory: Decreased short-term memory  Problem Solving: Requires tactile cues        General Comments      Exercises     Assessment/Plan    PT Assessment Patient needs continued PT services  PT Problem List Decreased strength;Decreased activity tolerance;Decreased balance;Decreased mobility;Decreased cognition;Decreased knowledge of use of DME       PT Treatment Interventions DME instruction;Gait training;Functional mobility  training;Therapeutic activities;Therapeutic exercise;Balance training;Patient/family education    PT Goals (Current goals can be found in the Care Plan section)  Acute Rehab PT Goals Patient Stated Goal: none stated PT Goal Formulation: With patient Time For Goal Achievement: 07/09/17 Potential to Achieve Goals: Good    Frequency Min 3X/week   Barriers to discharge        Co-evaluation               AM-PAC PT "6 Clicks" Daily Activity  Outcome Measure Difficulty turning over in bed (including adjusting bedclothes, sheets and blankets)?: Unable Difficulty moving from lying on back to sitting on the side of the bed? : Unable Difficulty sitting down on and standing up from a chair with arms (e.g., wheelchair, bedside commode, etc,.)?: A Little Help needed moving to and from a bed to chair (including a wheelchair)?: A Little Help needed walking in hospital room?: A Little Help needed climbing 3-5 steps with a railing? : A Little 6 Click Score: 14    End of Session Equipment Utilized During Treatment: Gait belt Activity Tolerance: Patient limited by fatigue Patient left: in bed;with call bell/phone within reach;with bed alarm set   PT Visit Diagnosis: Muscle weakness (generalized) (M62.81);Difficulty in walking, not elsewhere classified (R26.2)    Time: 4540-9811 PT Time Calculation (min) (ACUTE ONLY): 18 min   Charges:   PT Evaluation $PT Eval Low Complexity: 1 Low     PT G Codes:          Rebeca Alert, MPT Pager: [phone]

## 2017-06-26 DIAGNOSIS — K5730 Diverticulosis of large intestine without perforation or abscess without bleeding: Secondary | ICD-10-CM

## 2017-06-26 DIAGNOSIS — E871 Hypo-osmolality and hyponatremia: Secondary | ICD-10-CM

## 2017-06-26 DIAGNOSIS — R634 Abnormal weight loss: Secondary | ICD-10-CM

## 2017-06-26 LAB — MRSA PCR SCREENING: MRSA by PCR: NEGATIVE

## 2017-06-26 NOTE — Care Management Important Message (Signed)
Important Message  Patient Details IM Letter given to Kathy/Case Manager to present to Katherine Flynn MRN: [id number] Date of Birth: [birth year]   Medicare Important Message Given:  Yes    Caren Macadam 06/26/2017, 11:41 AMImportant Message  Patient Details  Name: Katherine Flynn MRN: [id number] Date of Birth: [birth year]   Medicare Important Message Given:  Yes    Caren Macadam 06/26/2017, 11:40 AM

## 2017-06-26 NOTE — Progress Notes (Signed)
Patient ID: Katherine Flynn, female   DOB: 1923-03-13, 83 y.o.   MRN: [id number]     Progress Note   Subjective    A little confused about how she got to hospital etc- asking for her son No diarrhea this am , mild abdominal discomfort  Flex yesterday with solid stool in colon - bx's pending, no mucosal evidence for cdiff    Objective   Vital signs in last 24 hours: Temp:  [97.9 F (36.6 C)-98.2 F (36.8 C)] 98.2 F (36.8 C) (10/16 0505) Pulse Rate:  [60-88] 70 (10/16 0505) Resp:  [14-20] 16 (10/16 0505) BP: (112-140)/(47-65) 124/55 (10/16 0505) SpO2:  [96 %-100 %] 96 % (10/16 0505) Weight:  [77 lb (34.9 kg)] 77 lb (34.9 kg) (10/15 1418) Last BM Date: 06/26/17 General:  Elderly   white female in NAD Heart:  Regular rate and rhythm; no murmurs Lungs: Respirations even and unlabored, lungs CTA bilaterally Abdomen:  Soft, mildly tender and nondistended. Normal bowel sounds. Extremities:  Without edema. Neurologic:  Alert and oriented,  grossly normal neurologically. Psych:  Cooperative. Normal mood and affect.  Intake/Output from previous day: 10/15 0701 - 10/16 0700 In: 250 [P.O.:50; I.V.:200] Out: 0  Intake/Output this shift: No intake/output data recorded.  Lab Results:  Recent Labs  06/24/17 0541  WBC 8.4  HGB 9.5*  HCT 30.3*  PLT 531*   BMET  Recent Labs  06/24/17 0541  NA 139  K 3.7  CL 107  CO2 25  GLUCOSE 86  BUN 12  CREATININE 0.67  CALCIUM 8.4*   LFT No results for input(s): PROT, ALBUMIN, AST, ALT, ALKPHOS, BILITOT, BILIDIR, IBILI in the last 72 hours. PT/INR No results for input(s): LABPROT, INR in the last 72 hours.      Assessment / Plan:     #1 83 yo WF admitted with diarrhea x several weeks and weight loss  Infectious workup negative  Flex yesterday - solid stool in colon - bx 's pending to r/o microscopic colitis  ? SIBO  With prior  Bilroth II procedure   Improved today with imodium  #2 Persistent large anastomotic  ulcer  - with recent GI bleed #3 anemia - multifactoral   Plan : Soft diet Continue BID PPI and QID Carafate  Await bx  continue Imodium prn for now    Contact  Amy Esterwood, P.A.-C               [phone]       LOS: 3 days   Amy Esterwood  06/26/2017, 9:55 AM   ________________________________________________________________________  [facility] MD note:  I personally examined the patient, reviewed the data and agree with the assessment and plan described above.  Pathology was consistent with lymphocytic colitis.  Her diarrhea seems to have stopped already with imodium PRN and also fiber supplements.  If the diarrhea resumes despite 1-2 imodium per day, please let GI know and would start budesonide orally. She is OK for discharge from my perspective.  Please call or page with any further questions or concerns.    Rob Bunting, MD [facility] Pager [phone]

## 2017-06-26 NOTE — Clinical Social Work Note (Addendum)
Clinical Social Work Assessment  Patient Details  Name: Katherine Flynn MRN: [id number] Date of Birth: 1923-08-11  Date of referral:  06/26/17               Reason for consult:  Facility Placement                Permission sought to share information with:  Family Supports Permission granted to share information::  Yes, Verbal Permission Granted  Name::     Katherine Flynn  Agency::  SNF  Relationship::  son  Contact Information:  [phone]  Housing/Transportation Living arrangements for the past 2 months:  Apartment Source of Information:  Patient Patient Interpreter Needed:  None Criminal Activity/Legal Involvement Pertinent to Current Situation/Hospitalization:  No  Significant Relationships:  None Lives with:  Self Do you feel safe going back to the place where you live?  Yes Need for family participation in patient care:  Yes  Care giving concerns:  Patient is concern about patient ability to care for herself at home, alone. Patient son reports the patient has a neighbor who helps out 10 hours a day and the patient is left alone without care support. Patient son is concern about the patient being alone after this hospitalization.   Social Worker assessment / plan:  CSW met with patient at bedside, explain role and reason for visit- discuss discharge plan. Patient alert to to place and self. Patient reports, " I want to go home." She gave CSW permission to talk with her son about the d/c plan. Patient son Katherine Flynn feels the patient can benefit from rehab before going home. He reports prior this admission the patient was  at home with severe diarrhea and unable to care for herself.  He reports the patient is oriented at times and other times very forgetful. He reports he has been researching and working with DSS to transition the patient into an Emergency planning/management officer. He understands it is a long process.   CSW informed the patient son about the faxing out process. He is  familiar with the process because the patient went to [place] back in August.  The patient has [facility] and will need prior authorization prior to discharge.  Patient will need updated PT note.   Plan: Assist with d/c to SNF, if recommended.   Employment status:  Retired Nurse, adult PT Recommendations:  [place] / Referral to community resources:  [place]  Patient/Family's Response to care: Patient son agreeable to SNF placement. Family involved in care and appreciated csw services.  Patient/Family's Understanding of and Emotional Response to Diagnosis, Current Treatment, and Prognosis: Family understanding of d/c planning and medical interventions.   Emotional Assessment Appearance:  Appears stated age Attitude/Demeanor/Rapport:    Affect (typically observed):  Accepting, Tearful/Crying Orientation:  Oriented to Self, Oriented to Place Alcohol / Substance use:  Not Applicable Psych involvement (Current and /or in the community):  No (Comment)  Discharge Needs  Concerns to be addressed:  Discharge Planning Concerns, Care Coordination Readmission within the last 30 days:  No Current discharge risk:  None Barriers to Discharge:  Continued Medical Work up   [employer], LCSW 06/26/2017, 11:42 AM

## 2017-06-26 NOTE — Progress Notes (Signed)
_0 @        PROGRESS NOTE                                                                                                                                                                                                             Patient Demographics:    Katherine Flynn, is a 83 y.o. female, DOB - 1923/03/22, OMB:[id number]  Admit date - 06/22/2017   Admitting Physician A Melven Sartorius., MD  Outpatient Primary MD for the patient is Marletta Lor, MD  LOS - 3  No chief complaint on file.      Brief Narrative  Katherine Flynn is a 83 y.o. female with medical history significant of hypothyroidism, PUD s/p billroth II gastrojejunostoy, reflux, esophagitis, pyloric stenosis, history of Nissen fundoplication, diverticulosis, with recent admission for an upper GI bleed secondary to a gastrojejunal anastomotic ulcer which was felt to be likely NSAID related who is presenting with 3 weeks of diarrhea.   Subjective:   Patient in bed, appears comfortable, denies any headache, no fever, no chest pain or pressure, no shortness of breath , no abdominal pain. No focal weakness.    Assessment  & Plan :     1. Subacute to chronic diarrhea ongoing now for 3 weeks. No clear etiology, C. Difficile and GI pathogen panel negative, C. Difficile was checked in the GI office on 06/14/2017, gastroenterology team following, post limited poor quality sigmoidoscopy on 06/25/2017 with pending biopsies to rule out any inflammation or ischemia, currently diarrhea improving on when necessary Imodium which will be continued, continue soft diet and monitor. If stable likely discharged to SNF tomorrow.  2. Gastric bypass surgery with GJ anastomotic ulcer. Continue twice a day PPI and monitor clinically.  3. Hypokalemia. Replaced and stable.  4. Anemia of chronic and iron deficiency. Continue to monitor no need for transfusion. Once better place on oral iron.  5. UTI with leukocytosis.  Finished 3 days of Rocephin.  6. Unintentional weight loss due to diarrhea. Monitor, once diarrhea but her protein supplementation.  7. Possible MAC infection noted on CT scan. Supportive care, not a candidate for multiple antibiotics at this time.    Diet : Diet Heart Room service appropriate? Yes; Fluid consistency: Thin    Family Communication  :  Son in detail on 06/25/2017, he wants SNF placement which has been requested through social work.  Code Status :  DNR  Disposition Plan  :  Most likely SNF 1-2 days  Consults  :  GI Dr  Mann   Procedures  :    CT - : 1. Complex upper GI anatomy status post Billroth II gastrojejunostomy. 2. There are persistent inflammatory changes at the gastrojejunostomy site as demonstrated on upper endoscopy from 04/15/2017. Persistent ulceration within this area is suspected. No free intraperitoneal air identified. 3. Secondary inflammatory changes are noted involving the transverse colon which may explain patient's worsening diarrhea and abdominal pain. No significant free fluid or fluid collections identified. 4. Chronic postinflammatory changes within both lower lung zones compatible with indolent atypical infection such as mycobacterium avium complex. 5. Unchanged chronic dilatation of the common bile and pancreatic ducts.   DVT Prophylaxis  :   SCDs    Lab Results  Component Value Date   PLT 531 (H) 06/24/2017    Inpatient Medications  Scheduled Meds: . feeding supplement (ENSURE ENLIVE)  237 mL Oral BID BM  . levothyroxine  100 mcg Oral QAC breakfast  . metoprolol tartrate  25 mg Oral BID  . multivitamin with minerals  1 tablet Oral Daily  . pantoprazole  40 mg Oral BID  . psyllium  1 packet Oral Daily  . sucralfate  1 g Oral Q6H   Continuous Infusions:  PRN Meds:.acetaminophen, loperamide  Antibiotics  :    Anti-infectives    Start     Dose/Rate Route Frequency Ordered Stop   06/22/17 2000  cefTRIAXone (ROCEPHIN) 1 g in dextrose  5 % 50 mL IVPB     1 g 100 mL/hr over 30 Minutes Intravenous Every 24 hours 06/22/17 1841 06/25/17 0146         Objective:   Vitals:   06/25/17 1550 06/25/17 1600 06/25/17 2110 06/26/17 0505  BP: (!) 132/51 (!) 130/47 112/65 (!) 124/55  Pulse: 60 62 88 70  Resp: _0 Temp:   97.9 F (36.6 C) 98.2 F (36.8 C)  TempSrc:   Axillary Oral  SpO2: 100% 100% 98% 96%  Weight:      Height:        Wt Readings from Last 3 Encounters:  06/25/17 34.9 kg (77 lb)  06/12/17 35.8 kg (79 lb)  06/10/17 39.5 kg (87 lb)     Intake/Output Summary (Last 24 hours) at 06/26/17 1057 Last data filed at 06/25/17 1527  Gross per 24 hour  Intake              200 ml  Output                0 ml  Net              200 ml     Physical Exam  Awake in bed in no discomfort, oriented 1, appears frail and cachectic, No new F.N deficits,   [place].AT,PERRAL Supple Neck,No JVD, No cervical lymphadenopathy appriciated.  Symmetrical Chest wall movement, Good air movement bilaterally, CTAB RRR,No Gallops,Rubs or new Murmurs, No Parasternal Heave +ve B.Sounds, Abd Soft, No tenderness, No organomegaly appriciated, No rebound - guarding or rigidity. No Cyanosis, Clubbing or edema, No new Rash or bruise      Data Review:    CBC  Recent Labs Lab 06/22/17 1135 06/23/17 0518 06/24/17 0541  WBC 13.6* 13.1* 8.4  HGB 10.6* 9.6* 9.5*  HCT 33.4* 30.7* 30.3*  PLT 517* 542* 531*  MCV 80.7 81.2 81.0  MCH 25.6* 25.4* 25.4*  MCHC 31.7 31.3 31.4  RDW 14.8 15.2 15.1  LYMPHSABS 0.9  --   --   MONOABS 0.6  --   --  EOSABS 0.1  --   --   BASOSABS 0.0  --   --     Chemistries   Recent Labs Lab 06/22/17 1135 06/23/17 0518 06/24/17 0541  NA 138 138 139  K 2.8* 3.8 3.7  CL 99* 106 107  CO2 _0 GLUCOSE 124* 142* 86  BUN _1 CREATININE 0.74 0.66 0.67  CALCIUM 9.1 8.5* 8.4*  MG 2.0  --   --   AST 22 21  --   ALT 12* 14  --   ALKPHOS 105 101  --   BILITOT 0.3 0.4  --     ------------------------------------------------------------------------------------------------------------------ No results for input(s): CHOL, HDL, LDLCALC, TRIG, CHOLHDL, LDLDIRECT in the last 72 hours.  Lab Results  Component Value Date   HGBA1C 5.6 04/19/2015   ------------------------------------------------------------------------------------------------------------------ No results for input(s): TSH, T4TOTAL, T3FREE, THYROIDAB in the last 72 hours.  Invalid input(s): FREET3 ------------------------------------------------------------------------------------------------------------------ No results for input(s): VITAMINB12, FOLATE, FERRITIN, TIBC, IRON, RETICCTPCT in the last 72 hours.  Coagulation profile No results for input(s): INR, PROTIME in the last 168 hours.  No results for input(s): DDIMER in the last 72 hours.  Cardiac Enzymes No results for input(s): CKMB, TROPONINI, MYOGLOBIN in the last 168 hours.  Invalid input(s): CK ------------------------------------------------------------------------------------------------------------------ No results found for: BNP  Micro Results Recent Results (from the past 240 hour(s))  Gastrointestinal Panel by PCR , Stool     Status: None   Collection Time: 06/22/17  3:02 PM  Result Value Ref Range Status   Campylobacter species NOT DETECTED NOT DETECTED Final   Plesimonas shigelloides NOT DETECTED NOT DETECTED Final   Salmonella species NOT DETECTED NOT DETECTED Final   Yersinia enterocolitica NOT DETECTED NOT DETECTED Final   Vibrio species NOT DETECTED NOT DETECTED Final   Vibrio cholerae NOT DETECTED NOT DETECTED Final   Enteroaggregative E coli (EAEC) NOT DETECTED NOT DETECTED Final   Enteropathogenic E coli (EPEC) NOT DETECTED NOT DETECTED Final   Enterotoxigenic E coli (ETEC) NOT DETECTED NOT DETECTED Final   Shiga like toxin producing E coli (STEC) NOT DETECTED NOT DETECTED Final   Shigella/Enteroinvasive E coli  (EIEC) NOT DETECTED NOT DETECTED Final   Cryptosporidium NOT DETECTED NOT DETECTED Final   Cyclospora cayetanensis NOT DETECTED NOT DETECTED Final   Entamoeba histolytica NOT DETECTED NOT DETECTED Final   Giardia lamblia NOT DETECTED NOT DETECTED Final   Adenovirus F40/41 NOT DETECTED NOT DETECTED Final   Astrovirus NOT DETECTED NOT DETECTED Final   Norovirus GI/GII NOT DETECTED NOT DETECTED Final   Rotavirus A NOT DETECTED NOT DETECTED Final   Sapovirus (I, II, IV, and V) NOT DETECTED NOT DETECTED Final    Radiology Reports Ct Abdomen Pelvis W Contrast  Result Date: 06/10/2017 CLINICAL DATA:  Increasing diarrhea.  Abdominal pain. EXAM: CT ABDOMEN AND PELVIS WITH CONTRAST TECHNIQUE: Multidetector CT imaging of the abdomen and pelvis was performed using the standard protocol following bolus administration of intravenous contrast. CONTRAST:  14m ISOVUE-300 IOPAMIDOL (ISOVUE-300) INJECTION 61% COMPARISON:  01/04/2017 FINDINGS: Lower chest: Chronic appearing atelectasis, bronchiectasis and tree-in-bud nodularity identified within bilateral lower lung zones. No pleural effusion. Hepatobiliary: No focal liver abnormality identified. The gallbladder appears within normal limits. There is mild chronic appearing intrahepatic biliary dilatation. The appearance is unchanged from previous exam. The CBD measures up to 9 mm. No obstructing stone identified. Pancreas: The pancreatic duct appears increased in caliber measuring up to 6 mm. Similar to previous exam. No mass identified. No inflammation Spleen:  Spleen is normal. Adrenals/Urinary Tract: The adrenal glands are unremarkable. 3.7 cm lower pole kidney cyst is identified. No kidney mass or hydronephrosis identified. Left kidney cyst measures 1.2 cm. The urinary bladder is partially obscured by beam hardening artifact from right hip arthroplasty device. Stomach/Bowel: Upper GI anatomy is complex. The patient has undergone a Billroth II gastrojejunostomy.  There is off marked inflammatory changes surrounding the gastrojejunostomy site. Ulcer crater, described on endoscopy from 04/15/2017 may still be present measuring approximately 2 cm, image 41 of series 6. Surrounding inflammatory changes involve the transverse colon resulting secondary colitis with wall edema and fat stranding. No pathologic dilatation of the small or large bowel loops identified. Vascular/Lymphatic: Aortic atherosclerosis peer no aneurysm. No adenopathy identified within the upper abdomen. No pelvic or inguinal adenopathy. Reproductive: Uterus is not well visualized.  No adnexal mass noted. Other: None Musculoskeletal: Previous right hip arthroplasty. Scoliosis and degenerative disc disease identified IMPRESSION: 1. Complex upper GI anatomy status post Billroth II gastrojejunostomy. 2. There are persistent inflammatory changes at the gastrojejunostomy site as demonstrated on upper endoscopy from 04/15/2017. Persistent ulceration within this area is suspected. No free intraperitoneal air identified. 3. Secondary inflammatory changes are noted involving the transverse colon which may explain patient's worsening diarrhea and abdominal pain. No significant free fluid or fluid collections identified. 4. Chronic postinflammatory changes within both lower lung zones compatible with indolent atypical infection such as mycobacterium avium complex. 5. Unchanged chronic dilatation of the common bile and pancreatic ducts. Electronically Signed   By: Kerby Moors M.D.   On: 06/10/2017 17:40    Time Spent in minutes  30   Lala Lund M.D on 06/26/2017 at 10:57 AM  Between 7am to 7pm - Pager - [phone] ( page via [email or web address], text pages only, please mention full 10 digit call back number). After 7pm go to [email or web address] - password [email or web address]

## 2017-06-27 DIAGNOSIS — R197 Diarrhea, unspecified: Secondary | ICD-10-CM

## 2017-06-27 LAB — CBC
HCT: 27.7 % — ABNORMAL LOW (ref 36.0–46.0)
HEMOGLOBIN: 8.6 g/dL — AB (ref 12.0–15.0)
MCH: 24.7 pg — AB (ref 26.0–34.0)
MCHC: 31 g/dL (ref 30.0–36.0)
MCV: 79.6 fL (ref 78.0–100.0)
Platelets: 467 10*3/uL — ABNORMAL HIGH (ref 150–400)
RBC: 3.48 MIL/uL — AB (ref 3.87–5.11)
RDW: 15.3 % (ref 11.5–15.5)
WBC: 4.7 10*3/uL (ref 4.0–10.5)

## 2017-06-27 LAB — BASIC METABOLIC PANEL
ANION GAP: 7 (ref 5–15)
BUN: 11 mg/dL (ref 6–20)
CHLORIDE: 106 mmol/L (ref 101–111)
CO2: 25 mmol/L (ref 22–32)
Calcium: 8.1 mg/dL — ABNORMAL LOW (ref 8.9–10.3)
Creatinine, Ser: 0.6 mg/dL (ref 0.44–1.00)
GFR calc non Af Amer: >60 mL/min (ref >60)
Glucose, Bld: 79 mg/dL (ref 65–99)
POTASSIUM: 3.5 mmol/L (ref 3.5–5.1)
SODIUM: 138 mmol/L (ref 135–145)

## 2017-06-27 LAB — PANCREATIC ELASTASE, FECAL: PANCREATIC ELASTASE-1, STL: 243 ug Elast./g (ref >200)

## 2017-06-27 MED ORDER — BUDESONIDE 3 MG PO CPEP
9.0000 mg | ORAL_CAPSULE | Freq: Every day | ORAL | Status: DC
  Administered 2017-06-27 – 2017-06-29 (×3): 9 mg via ORAL
  Filled 2017-06-27 (×3): qty 3

## 2017-06-27 NOTE — [place] FL2 (Signed)
[place] MEDICAID FL2 LEVEL OF CARE SCREENING TOOL     IDENTIFICATION  Patient Name: Katherine Flynn Birthdate: 04/06/1923 Sex: female Admission Date (Current Location): 06/22/2017  [facility] [place]   Facility and Address:  [facility],  [address], [place] [id number]      Provider Number: [id number]  Attending Physician Name and Address:  Starleen ArmsElgergawy, Dawood S, MD  Relative Name and Phone Number:  [name], son, [phone]    Current Level of Care: Hospital Recommended Level of Care: Skilled Nursing Facility Prior Approval Number:    Date Approved/Denied:   PASRR Number: [id number]  Discharge Plan: SNF    Current Diagnoses: Patient Active Problem List   Diagnosis Date Noted  . Diverticulosis of colon without hemorrhage   . Diarrhea 06/22/2017  . PUD (peptic ulcer disease) 06/22/2017  . Abdominal pain 06/22/2017  . Pyuria 06/22/2017  . Acute upper gastrointestinal bleeding   . Bloody emesis   . Acute gastric ulcer with hemorrhage   . GI bleeding 04/06/2017  . Tachycardia 04/06/2017  . Dehydration 05/01/2016  . Multiple pelvic fractures 11/04/2015  . Fall at home 11/04/2015  . Prolonged QT interval 11/04/2015  . RBBB 11/04/2015  . Dilated bile duct 11/04/2015  . Pancreatic duct dilated 11/04/2015  . Abnormal CT of the abdomen 11/04/2015  . Fall 04/19/2015  . Protein-calorie malnutrition, severe (HCC) 04/19/2015  . Closed fracture of right iliac wing (HCC) 04/18/2015  . Peri-prosthetic fracture around prosthetic hip 04/18/2015  . Protein calorie malnutrition (HCC) 04/18/2015  . Iron deficiency anemia 12/30/2012  . Chronic bronchitis with COPD (chronic obstructive pulmonary disease) (HCC) 11/19/2007  . UTI'S, RECURRENT 11/19/2007  . Hypothyroidism 11/04/2007  . Essential hypertension 11/04/2007  . GERD 11/04/2007  . Osteoarthritis 11/04/2007  . Weight loss 11/04/2007  . PYLORIC STENOSIS 07/18/2007  . OTHER  OBSTRUCTION OF DUODENUM 08/10/2006    Orientation RESPIRATION BLADDER Height & Weight     Self, Place  Normal Incontinent Weight: 77 lb (34.9 kg) Height:  5' (152.4 cm)  BEHAVIORAL SYMPTOMS/MOOD NEUROLOGICAL BOWEL NUTRITION STATUS      Incontinent Diet (Heart Healthy)  AMBULATORY STATUS COMMUNICATION OF NEEDS Skin   Extensive Assist Verbally Normal                       Personal Care Assistance Level of Assistance  Bathing, Feeding, Dressing Bathing Assistance: Limited assistance Feeding assistance: Independent Dressing Assistance: Limited assistance     Functional Limitations Info  Sight, Hearing, Speech Sight Info: Adequate Hearing Info: Adequate Speech Info: Adequate    SPECIAL CARE FACTORS FREQUENCY  PT (By licensed PT), OT (By licensed OT)     PT Frequency: 5x OT Frequency: 5x            Contractures Contractures Info: Not present    Additional Factors Info  Code Status, Allergies Code Status Info: DNR Allergies Info: NKA           Current Medications (06/27/2017):  This is the current hospital active medication list Current Facility-Administered Medications  Medication Dose Route Frequency Provider Last Rate Last Dose  . acetaminophen (TYLENOL) tablet 1,000 mg  1,000 mg Oral Q8H PRN Zigmund DanielPowell, A Caldwell Jr., MD   1,000 mg at 06/27/17 0027  . budesonide (ENTOCORT EC) 24 hr capsule 9 mg  9 mg Oral Daily Elgergawy, Dawood S, MD      . feeding supplement (ENSURE ENLIVE) (ENSURE ENLIVE) liquid 237 mL  237 mL  Oral BID BM Leroy Sea, MD   237 mL at 06/27/17 1400  . levothyroxine (SYNTHROID, LEVOTHROID) tablet 100 mcg  100 mcg Oral QAC breakfast Zigmund Daniel., MD   100 mcg at 06/27/17 0910  . loperamide (IMODIUM) capsule 2-4 mg  2-4 mg Oral QID PRN Zigmund Daniel., MD   4 mg at 06/27/17 0910  . metoprolol tartrate (LOPRESSOR) tablet 25 mg  25 mg Oral BID Zigmund Daniel., MD   25 mg at 06/27/17 1610  . multivitamin with minerals  tablet 1 tablet  1 tablet Oral Daily Zigmund Daniel., MD   1 tablet at 06/27/17 0910  . pantoprazole (PROTONIX) EC tablet 40 mg  40 mg Oral BID Zigmund Daniel., MD   40 mg at 06/27/17 0910  . psyllium (HYDROCIL/METAMUCIL) packet 1 packet  1 packet Oral Daily Rachael Fee, MD   1 packet at 06/26/17 0915  . sucralfate (CARAFATE) 1 GM/10ML suspension 1 g  1 g Oral Q6H Esterwood, Amy S, PA-C   1 g at 06/27/17 1145     Discharge Medications: Please see discharge summary for a list of discharge medications.  Relevant Imaging Results:  Relevant Lab Results:   Additional Information SSN [id number]  Antionette Poles, LCSW

## 2017-06-27 NOTE — Progress Notes (Signed)
Occupational Therapy Treatment Patient Details Name: Katherine Flynn MRN: [id number] DOB: 23-Nov-1922 Today's Date: 06/27/2017    History of present illness 83 yo female admitted with diarrhea. Hx of anemia, multiple falls, low back pain, OA, COPD, GERD.    OT comments  Pt with diarrhea. CNA aware  Follow Up Recommendations  SNF    Equipment Recommendations  None recommended by OT    Recommendations for Other Services      Precautions / Restrictions Precautions Precautions: Fall       Mobility Bed Mobility Overal bed mobility: Needs Assistance Bed Mobility: Supine to Sit     Supine to sit: [facility] elevated;Min assist        Transfers Overall transfer level: Needs assistance Equipment used: 1 person hand held assist Transfers: Sit to/from Stand;Stand Pivot Transfers Sit to Stand: Min assist Stand pivot transfers: Mod assist            Balance Overall balance assessment: Needs assistance;History of Falls         Standing balance support: Bilateral upper extremity supported Standing balance-Leahy Scale: Poor                             ADL either performed or assessed with clinical judgement   ADL Overall ADL's : Needs assistance/impaired                         Toilet Transfer: BSC;Moderate assistance   Toileting- Clothing Manipulation and Hygiene: Sit to/from stand;Cueing for safety;Cueing for sequencing;Total assistance Toileting - Clothing Manipulation Details (indicate cue type and reason): pt with diarhea several times with sit to stand. Pt total A for hygiene.  pt left sitting on BSC because sitter was present                       Cognition Arousal/Alertness: Awake/alert Behavior During Therapy: WFL for tasks assessed/performed Overall Cognitive Status: Within Functional Limits for tasks assessed                                                     Pertinent Vitals/ Pain       Pain  Assessment: No/denies pain         Frequency  Min 2X/week        Progress Toward Goals  OT Goals(current goals can now be found in the care plan section)  Progress towards OT goals: Progressing toward goals     Plan Discharge plan remains appropriate       AM-PAC PT "6 Clicks" Daily Activity     Outcome Measure   Help from another person eating meals?: A Little Help from another person taking care of personal grooming?: A Little Help from another person toileting, which includes using toliet, bedpan, or urinal?: Total Help from another person bathing (including washing, rinsing, drying)?: A Lot Help from another person to put on and taking off regular upper body clothing?: Total Help from another person to put on and taking off regular lower body clothing?: Total 6 Click Score: 11    End of Session    OT Visit Diagnosis: Muscle weakness (generalized) (M62.81);History of falling (Z91.81);Unsteadiness on feet (R26.81)   Activity Tolerance Patient limited by fatigue   Patient Left in  bed;with call bell/phone within reach;with bed alarm set;with nursing/sitter in room   Nurse Communication Mobility status        Time: 1350-1414 OT Time Calculation (min): 24 min  Charges: OT General Charges $OT Visit: 1 Visit OT Treatments $Self Care/Home Management : 23-37 mins  AntlerLori Revis Whalin, [place] [phone]   Einar CrowEDDING, Rhianna Raulerson D 06/27/2017, 2:41 PM

## 2017-06-27 NOTE — Progress Notes (Signed)
_0 @        PROGRESS NOTE                                                                                                                                                                                                             Patient Demographics:    Katherine Flynn, is a 83 y.o. female, DOB - 1922-11-01, WCH:[id number]  Admit date - 06/22/2017   Admitting Physician A Melven Sartorius., MD  Outpatient Primary MD for the patient is Marletta Lor, MD  LOS - 4  No chief complaint on file.      Brief Narrative  Katherine Flynn is a 83 y.o. female with medical history significant of hypothyroidism, PUD s/p billroth II gastrojejunostoy, reflux, esophagitis, pyloric stenosis, history of Nissen fundoplication, diverticulosis, with recent admission for an upper GI bleed secondary to a gastrojejunal anastomotic ulcer which was felt to be likely NSAID related who is presenting with 3 weeks of diarrhea.   Subjective:   Patient in bed, appears comfortable, denies any headache, no fever, no chest pain or pressure, no shortness of breath , she had multiple episodes of diarrhea yesterday, as well couple episodes already this a.m..   Assessment  & Plan :     Subacute to chronic diarrhea -  ongoing now for 3 weeks. No clear etiology, was negative for C. difficile on 06/14/2017 and GI office, GI pathogen panel negative, lactoferrin is negative as well, Currently on when necessary Imodium,  - GI on board, flexible sigmoidoscopy on 06/25/2017 as there was mild inflammation noted on CT scan in the colon area, question ischemia versus inflammation. - Pathology significant for lymphocytic colitis, she remains with significant diarrhea despite being on Imodium, will start on budesonide  Gastric bypass surgery with GJ anastomotic ulcer. -  Continue twice a day PPI and monitor clinically.  Hypokalemia. - Replaced and stable.  Anemia of chronic and iron deficiency. - Continue to  monitor no need for transfusion. Once better place on oral iron.  UTI with leukocytosis. -  Finished 3 days of Rocephin.  Unintentional weight loss due to diarrhea. Monitor, once diarrhea but her protein supplementation.   Possible MAC infection noted on CT scan. Supportive care, not a candidate for multiple antibiotics at this time.    Diet : DIET SOFT Room service appropriate? Yes; Fluid consistency: Thin    Family Communication  :  None at bedside  Code Status :  DNR  Disposition Plan  :  Most likely SNF 1 diarrhea improves  Consults  :  GI   Procedures  :    CT - : 1. Complex upper GI anatomy status post Billroth II gastrojejunostomy. 2. There are persistent inflammatory changes at the gastrojejunostomy site as demonstrated on upper endoscopy from 04/15/2017. Persistent ulceration within this area is suspected. No free intraperitoneal air identified. 3. Secondary inflammatory changes are noted involving the transverse colon which may explain patient's worsening diarrhea and abdominal pain. No significant free fluid or fluid collections identified. 4. Chronic postinflammatory changes within both lower lung zones compatible with indolent atypical infection such as mycobacterium avium complex. 5. Unchanged chronic dilatation of the common bile and pancreatic ducts.   DVT Prophylaxis  :   SCDs    Lab Results  Component Value Date   PLT 467 (H) 06/27/2017    Inpatient Medications  Scheduled Meds: . feeding supplement (ENSURE ENLIVE)  237 mL Oral BID BM  . levothyroxine  100 mcg Oral QAC breakfast  . metoprolol tartrate  25 mg Oral BID  . multivitamin with minerals  1 tablet Oral Daily  . pantoprazole  40 mg Oral BID  . psyllium  1 packet Oral Daily  . sucralfate  1 g Oral Q6H   Continuous Infusions:  PRN Meds:.acetaminophen, loperamide  Antibiotics  :    Anti-infectives    Start     Dose/Rate Route Frequency Ordered Stop   06/22/17 2000  cefTRIAXone (ROCEPHIN) 1 g in  dextrose 5 % 50 mL IVPB     1 g 100 mL/hr over 30 Minutes Intravenous Every 24 hours 06/22/17 1841 06/25/17 0146         Objective:   Vitals:   06/26/17 1527 06/26/17 2130 06/27/17 0526 06/27/17 1401  BP: 132/70 (!) 132/58 (!) 117/49 131/74  Pulse: 76 69 (!) 53 81  Resp: _0 Temp: 98.9 F (37.2 C) 98.8 F (37.1 C) 98.4 F (36.9 C) 98 F (36.7 C)  TempSrc: Oral Oral Oral Oral  SpO2: 100% 94% 99% 99%  Weight:      Height:        Wt Readings from Last 3 Encounters:  06/25/17 34.9 kg (77 lb)  06/12/17 35.8 kg (79 lb)  06/10/17 39.5 kg (87 lb)     Intake/Output Summary (Last 24 hours) at 06/27/17 1406 Last data filed at 06/27/17 1100  Gross per 24 hour  Intake              360 ml  Output                0 ml  Net              360 ml     Physical Exam  Awake Alert, Oriented X 2, No new F.N deficits, Normal affect  Symmetrical Chest wall movement, Good air movement bilaterally, CTAB RRR,No Gallops,Rubs or new Murmurs, No Parasternal Heave +ve B.Sounds, Abd Soft, Abd Soft,nontender, No rebound - guarding or rigidity. No Cyanosis, Clubbing or edema, No new Rash or bruise     Data Review:    CBC  Recent Labs Lab 06/22/17 1135 06/23/17 0518 06/24/17 0541 06/27/17 0458  WBC 13.6* 13.1* 8.4 4.7  HGB 10.6* 9.6* 9.5* 8.6*  HCT 33.4* 30.7* 30.3* 27.7*  PLT 517* 542* 531* 467*  MCV 80.7 81.2 81.0 79.6  MCH 25.6* 25.4* 25.4* 24.7*  MCHC 31.7 31.3 31.4 31.0  RDW 14.8 15.2 15.1 15.3  LYMPHSABS 0.9  --   --   --  MONOABS 0.6  --   --   --   EOSABS 0.1  --   --   --   BASOSABS 0.0  --   --   --     Chemistries   Recent Labs Lab 06/22/17 1135 06/23/17 0518 06/24/17 0541 06/27/17 0458  NA 138 138 139 138  K 2.8* 3.8 3.7 3.5  CL 99* 106 107 106  CO2 _0 GLUCOSE 124* 142* 86 79  BUN _1 CREATININE 0.74 0.66 0.67 0.60  CALCIUM 9.1 8.5* 8.4* 8.1*  MG 2.0  --   --   --   AST 22 21  --   --   ALT 12* 14  --   --   ALKPHOS 105  101  --   --   BILITOT 0.3 0.4  --   --    ------------------------------------------------------------------------------------------------------------------ No results for input(s): CHOL, HDL, LDLCALC, TRIG, CHOLHDL, LDLDIRECT in the last 72 hours.  Lab Results  Component Value Date   HGBA1C 5.6 04/19/2015   ------------------------------------------------------------------------------------------------------------------ No results for input(s): TSH, T4TOTAL, T3FREE, THYROIDAB in the last 72 hours.  Invalid input(s): FREET3 ------------------------------------------------------------------------------------------------------------------ No results for input(s): VITAMINB12, FOLATE, FERRITIN, TIBC, IRON, RETICCTPCT in the last 72 hours.  Coagulation profile No results for input(s): INR, PROTIME in the last 168 hours.  No results for input(s): DDIMER in the last 72 hours.  Cardiac Enzymes No results for input(s): CKMB, TROPONINI, MYOGLOBIN in the last 168 hours.  Invalid input(s): CK ------------------------------------------------------------------------------------------------------------------ No results found for: BNP  Micro Results Recent Results (from the past 240 hour(s))  Gastrointestinal Panel by PCR , Stool     Status: None   Collection Time: 06/22/17  3:02 PM  Result Value Ref Range Status   Campylobacter species NOT DETECTED NOT DETECTED Final   Plesimonas shigelloides NOT DETECTED NOT DETECTED Final   Salmonella species NOT DETECTED NOT DETECTED Final   Yersinia enterocolitica NOT DETECTED NOT DETECTED Final   Vibrio species NOT DETECTED NOT DETECTED Final   Vibrio cholerae NOT DETECTED NOT DETECTED Final   Enteroaggregative E coli (EAEC) NOT DETECTED NOT DETECTED Final   Enteropathogenic E coli (EPEC) NOT DETECTED NOT DETECTED Final   Enterotoxigenic E coli (ETEC) NOT DETECTED NOT DETECTED Final   Shiga like toxin producing E coli (STEC) NOT DETECTED NOT  DETECTED Final   Shigella/Enteroinvasive E coli (EIEC) NOT DETECTED NOT DETECTED Final   Cryptosporidium NOT DETECTED NOT DETECTED Final   Cyclospora cayetanensis NOT DETECTED NOT DETECTED Final   Entamoeba histolytica NOT DETECTED NOT DETECTED Final   Giardia lamblia NOT DETECTED NOT DETECTED Final   Adenovirus F40/41 NOT DETECTED NOT DETECTED Final   Astrovirus NOT DETECTED NOT DETECTED Final   Norovirus GI/GII NOT DETECTED NOT DETECTED Final   Rotavirus A NOT DETECTED NOT DETECTED Final   Sapovirus (I, II, IV, and V) NOT DETECTED NOT DETECTED Final  MRSA PCR Screening     Status: None   Collection Time: 06/26/17  9:19 AM  Result Value Ref Range Status   MRSA by PCR NEGATIVE NEGATIVE Final    Comment:        The GeneXpert MRSA Assay (FDA approved for NASAL specimens only), is one component of a comprehensive MRSA colonization surveillance program. It is not intended to diagnose MRSA infection nor to guide or monitor treatment for MRSA infections.     Radiology Reports Ct Abdomen Pelvis W Contrast  Result Date: 06/10/2017 CLINICAL DATA:  Increasing diarrhea.  Abdominal pain. EXAM: CT ABDOMEN AND PELVIS WITH CONTRAST TECHNIQUE: Multidetector CT imaging of the abdomen and pelvis was performed using the standard protocol following bolus administration of intravenous contrast. CONTRAST:  168m ISOVUE-300 IOPAMIDOL (ISOVUE-300) INJECTION 61% COMPARISON:  01/04/2017 FINDINGS: Lower chest: Chronic appearing atelectasis, bronchiectasis and tree-in-bud nodularity identified within bilateral lower lung zones. No pleural effusion. Hepatobiliary: No focal liver abnormality identified. The gallbladder appears within normal limits. There is mild chronic appearing intrahepatic biliary dilatation. The appearance is unchanged from previous exam. The CBD measures up to 9 mm. No obstructing stone identified. Pancreas: The pancreatic duct appears increased in caliber measuring up to 6 mm. Similar to  previous exam. No mass identified. No inflammation Spleen: Spleen is normal. Adrenals/Urinary Tract: The adrenal glands are unremarkable. 3.7 cm lower pole kidney cyst is identified. No kidney mass or hydronephrosis identified. Left kidney cyst measures 1.2 cm. The urinary bladder is partially obscured by beam hardening artifact from right hip arthroplasty device. Stomach/Bowel: Upper GI anatomy is complex. The patient has undergone a Billroth II gastrojejunostomy. There is off marked inflammatory changes surrounding the gastrojejunostomy site. Ulcer crater, described on endoscopy from 04/15/2017 may still be present measuring approximately 2 cm, image 41 of series 6. Surrounding inflammatory changes involve the transverse colon resulting secondary colitis with wall edema and fat stranding. No pathologic dilatation of the small or large bowel loops identified. Vascular/Lymphatic: Aortic atherosclerosis peer no aneurysm. No adenopathy identified within the upper abdomen. No pelvic or inguinal adenopathy. Reproductive: Uterus is not well visualized.  No adnexal mass noted. Other: None Musculoskeletal: Previous right hip arthroplasty. Scoliosis and degenerative disc disease identified IMPRESSION: 1. Complex upper GI anatomy status post Billroth II gastrojejunostomy. 2. There are persistent inflammatory changes at the gastrojejunostomy site as demonstrated on upper endoscopy from 04/15/2017. Persistent ulceration within this area is suspected. No free intraperitoneal air identified. 3. Secondary inflammatory changes are noted involving the transverse colon which may explain patient's worsening diarrhea and abdominal pain. No significant free fluid or fluid collections identified. 4. Chronic postinflammatory changes within both lower lung zones compatible with indolent atypical infection such as mycobacterium avium complex. 5. Unchanged chronic dilatation of the common bile and pancreatic ducts. Electronically Signed    By: TKerby MoorsM.D.   On: 06/10/2017 17:40    Time Spent in minutes  25 minutes   ELGERGAWY, DAWOOD M.D on 06/27/2017 at 2:06 PM  Between 7am to 7pm - Pager - [phone] After 7pm go to [email or web address] - password [facility]

## 2017-06-27 NOTE — Progress Notes (Signed)
PT Cancellation Note  Patient Details Name: Katherine Flynn MRN: [id number] DOB: [birth year]   Cancelled Treatment:    Reason Eval/Treat Not Completed: Medical issues which prohibited therapy, seen by OT having diarrhea.Check back another time   [name], [name] 06/27/2017, 4:47 PM [name] PT [phone]

## 2017-06-28 ENCOUNTER — Encounter (HOSPITAL_COMMUNITY): Payer: Self-pay | Admitting: Gastroenterology

## 2017-06-28 DIAGNOSIS — K219 Gastro-esophageal reflux disease without esophagitis: Secondary | ICD-10-CM

## 2017-06-28 MED ORDER — BUDESONIDE 3 MG PO CPEP: 9.0000 mg | ORAL_CAPSULE | Freq: Every day | ORAL | Status: DC

## 2017-06-28 MED ORDER — PSYLLIUM 95 % PO PACK: 1.0000 | PACK | Freq: Every day | ORAL | Status: AC

## 2017-06-28 MED ORDER — ENSURE ENLIVE PO LIQD: 237.0000 mL | Freq: Two times a day (BID) | ORAL | 12 refills | Status: AC

## 2017-06-28 MED ORDER — BOOST / RESOURCE BREEZE PO LIQD
1.0000 | ORAL | Status: DC
  Administered 2017-06-28: 1 via ORAL

## 2017-06-28 NOTE — Progress Notes (Signed)
Physical Therapy Treatment Patient Details Name: Katherine Flynn MRN: [id number] DOB: 03-05-1923 Today's Date: 06/28/2017    History of Present Illness 83 yo female admitted with diarrhea. Hx of anemia, multiple falls, low back pain, OA, COPD, GERD.     PT Comments    The patient is tearful at times. Asking why she is here. Oriented to place. Continue PT/mobility.    Follow Up Recommendations  SNF     Equipment Recommendations    none   Recommendations for Other Services       Precautions / Restrictions Precautions Precautions: Fall Precaution Comments: diarrhea    Mobility  Bed Mobility               General bed mobility comments: in recliner  Transfers Overall transfer level: Needs assistance Equipment used: Rolling walker (2 wheeled) Transfers: Sit to/from Stand Sit to Stand: Min assist         General transfer comment: Assist to rise, stabilize, control descent. VCs safety, technique, hand placement.   Ambulation/Gait Ambulation/Gait assistance: Min assist Ambulation Distance (Feet): 10 Feet (x2) Assistive device: Rolling walker (2 wheeled) Gait Pattern/deviations: Step-through pattern;Trunk flexed;Narrow base of support;Decreased stride length     General Gait Details: Assist to stabilize pt and maneuver with RW. Slow gait speed. VCs safety. Assisted to [facility] across the room   Stairs            Wheelchair Mobility    Modified Rankin (Stroke Patients Only)       Balance Overall balance assessment: Needs assistance;History of Falls Sitting-balance support: No upper extremity supported;Feet supported Sitting balance-Leahy Scale: Fair     Standing balance support: During functional activity;Bilateral upper extremity supported Standing balance-Leahy Scale: Poor                              Cognition Arousal/Alertness: Awake/alert Behavior During Therapy: WFL for tasks assessed/performed   Area of Impairment:  Memory;Problem solving                     Memory: Decreased short-term memory       Problem Solving: Requires tactile cues General Comments: emotional at times., oriented to WL. Asking "Why am I here?"      Exercises      General Comments        Pertinent Vitals/Pain Pain Assessment: Faces Faces Pain Scale: No hurt    Home Living                      Prior Function            PT Goals (current goals can now be found in the care plan section) Progress towards PT goals: Progressing toward goals    Frequency    Min 3X/week      PT Plan Current plan remains appropriate    Co-evaluation              AM-PAC PT "6 Clicks" Daily Activity  Outcome Measure  Difficulty turning over in bed (including adjusting bedclothes, sheets and blankets)?: Unable Difficulty moving from lying on back to sitting on the side of the bed? : Unable Difficulty sitting down on and standing up from a chair with arms (e.g., wheelchair, bedside commode, etc,.)?: A Lot Help needed moving to and from a bed to chair (including a wheelchair)?: A Lot Help needed walking in hospital room?: A Lot Help needed climbing  3-5 steps with a railing? : Total 6 Click Score: 9    End of Session   Activity Tolerance: Patient tolerated treatment well Patient left: in chair;with call bell/phone within reach;with chair alarm set Nurse Communication: Mobility status PT Visit Diagnosis: Muscle weakness (generalized) (M62.81);Difficulty in walking, not elsewhere classified (R26.2)     Time: [id number] PT Time Calculation (min) (ACUTE ONLY): 18 min  Charges:                       G CodesBlanchard Kelch:     Oval Moralez PT [phone]   [name], Banjamin Stovall [name] 06/28/2017, 8:40 AM

## 2017-06-28 NOTE — Discharge Instructions (Signed)
Follow with Primary MD Katherine Flynn, Katherine F, MD after discharge from SNF  Get CBC, CMP, checked  by Primary MD next visit.    Activity: As tolerated with Full fall precautions use walker/cane & assistance as needed   Disposition SNF   Diet: mechanical soft diet, can be changed to regulardiet when she gets her dentures  For Heart failure patients - Check your Weight same time everyday, if you gain over 2 pounds, or you develop in leg swelling, experience more shortness of breath or chest pain, call your Primary MD immediately. Follow Cardiac Low Salt Diet and 1.5 lit/day fluid restriction.   On your next visit with your primary care physician please Get Medicines reviewed and adjusted.   Please request your Prim.MD to go over all Hospital Tests and Procedure/Radiological results at the follow up, please get all Hospital records sent to your Prim MD by signing hospital release before you go home.   If you experience worsening of your admission symptoms, develop shortness of breath, life threatening emergency, suicidal or homicidal thoughts you must seek medical attention immediately by calling 911 or calling your MD immediately  if symptoms less severe.  You Must read complete instructions/literature along with all the possible adverse reactions/side effects for all the Medicines you take and that have been prescribed to you. Take any new Medicines after you have completely understood and accpet all the possible adverse reactions/side effects.   Do not drive, operating heavy machinery, perform activities at heights, swimming or participation in water activities or provide baby sitting services if your were admitted for syncope or siezures until you have seen by Primary MD or a Neurologist and advised to do so again.  Do not drive when taking Pain medications.    Do not take more than prescribed Pain, Sleep and Anxiety Medications  Special Instructions: If you have smoked or chewed  Tobacco  in the last 2 yrs please stop smoking, stop any regular Alcohol  and or any Recreational drug use.  Wear Seat belts while driving.   Please note  You were cared for by a hospitalist during your hospital stay. If you have any questions about your discharge medications or the care you received while you were in the hospital after you are discharged, you can call the unit and asked to speak with the hospitalist on call if the hospitalist that took care of you is not available. Once you are discharged, your primary care physician will handle any further medical issues. Please note that NO REFILLS for any discharge medications will be authorized once you are discharged, as it is imperative that you return to your primary care physician (or establish a relationship with a primary care physician if you do not have one) for your aftercare needs so that they can reassess your need for medications and monitor your lab values.

## 2017-06-28 NOTE — Progress Notes (Signed)
Nutrition Follow-up  DOCUMENTATION CODES:   Severe malnutrition in context of chronic illness, Underweight  INTERVENTION:  - Continue Ensure Enlive BID.  - Will trial Boost Breeze once/day, this supplement provides 250 kcal and 9 grams of protein. - Continue to encourage PO intakes of meals and supplements. - RD will continue to monitor for additional nutrition-related needs.  NUTRITION DIAGNOSIS:   Malnutrition related to chronic illness (COPD) as evidenced by percent weight loss, energy intake < or equal to 75% for > or equal to 1 month, severe depletion of body fat, severe depletion of muscle mass. -ongoing  GOAL:   Patient will meet greater than or equal to 90% of their needs -unmet.  MONITOR:   PO intake, Supplement acceptance, Labs, Weight trends, I & O's  ASSESSMENT:   83 y.o. female with medical history significant of hypothyroidism, PUD s/p billroth II gastrojejunostoy, reflux, esophagitis, pyloric stenosis, history of Nissen fundoplication, diverticulosis, with recent admission for an upper GI bleed secondary to a gastrojejunal anastomotic ulcer which was felt to be likely NSAID related who is presenting with 3 weeks of diarrhea.  10/18 Per chart review, pt consumed 25% of breakfast (150 kcal, 6 grams of protein) and 25% of lunch (101 kcal, 6 grams of protein) on 10/16 and 25% of lunch (164 kcal, 6 grams of protein) and 30% of dinner (166 kcal, 9 grams of protein) on 10/17. She underwent flex sig on 10/15. Solid stool found in the colon at that time; biopsies were taken with plan to r/o microscopic colitis. She continues to have multiple episodes of diarrhea per day. Denies symptoms with these episodes (such as nausea). Plan yesterday was to start budesonide to determine if it helps resolve diarrhea; no resolution with Imodium.   Ensure Enlive ordered BID and pt has been accepting it ~50% of the time. Will also trial Boost Breeze. Weight 10/15 was consistent with weight on  10/12; no new weight since 10/15. Discharge order to SNF (but no discharge summary) in place this AM.   Medications reviewed; 100 mcg oral Synthroid/day, 9 mg Entocort EC/day, daily multivitamin with minerals, 40 mg oral Protonix BID, 1 packet Metamucil/day, 1 g Carafate QID.  Labs reviewed; Ca: 8.1 mg/dL.     10/14 - Patient has been experiencing diarrhea for 3 weeks PTA.  - Pt ate only 1 meal yesterday. Has already ordered all meals for today.  - Per MD note, plan for flex sigmoidoscopy 10/15.  - Noted severe malnutrition status which was diagnosed during previous admission by nutrition staff.  - Malnutrition status has worsened given weight loss from chronic diarrhea. - Per chart review, pt has lost 17 lb since 7/28 (18% wt loss x 2.5 months, significant for time frame).   Nutrition-Focused physical exam completed. Findings are severe fat depletion, severe muscle depletion, and none edema.    Diet Order:  DIET SOFT Room service appropriate? Yes; Fluid consistency: Thin  Skin:  Reviewed, no issues  Last BM:  10/18  Height:   Ht Readings from Last 1 Encounters:  06/25/17 5' (1.524 m)    Weight:   Wt Readings from Last 1 Encounters:  06/25/17 77 lb (34.9 kg)    Ideal Body Weight:  45.5 kg  BMI:  Body mass index is 15.04 kg/m.  Estimated Nutritional Needs:   Kcal:  1200-1400  Protein:  50-60g  Fluid:  1.4L/day  EDUCATION NEEDS:   No education needs identified at this time    Trenton GammonJessica Tayveon Lombardo, MS, RD, LDN, CNSC  Inpatient Clinical Dietitian Pager # [phone] After hours/weekend pager # [phone]

## 2017-06-28 NOTE — Discharge Summary (Signed)
Katherine Flynn, is a 83 y.o. female  DOB 06/17/1923  MRN [id number].  Admission date:  06/22/2017  Admitting Physician  A Melven Sartorius., MD  Discharge Date:  06/28/2017   Primary MD  Marletta Lor, MD  Recommendations for primary care physician for things to follow:  - stick CBC, BMP in 3 days - start tapering budesonideafter4 weeks  CODE STATUS: DO NOT RESUSCITATE   Admission Diagnosis  bowel issue Diarrhea, IDA, Abnormal weight loss   Discharge Diagnosis  bowel issue Diarrhea, IDA, Abnormal weight loss    Principal Problem:   Diarrhea Active Problems:   Hypothyroidism   Essential hypertension   GERD   PUD (peptic ulcer disease)   Abdominal pain   Pyuria   Diverticulosis of colon without hemorrhage      Past Medical History:  Diagnosis Date  . Chronic pain syndrome   . COPD (chronic obstructive pulmonary disease) ([place])   . GERD (gastroesophageal reflux disease)   . History of pyloric stenosis   . Hypertension   . Macular degeneration   . Osteoarthritis   . Thyroid disease   . Weight loss     Past Surgical History:  Procedure Laterality Date  . APPENDECTOMY    . CATARACT EXTRACTION    . ESOPHAGOGASTRODUODENOSCOPY (EGD) WITH PROPOFOL N/A 04/07/2017   Procedure: ESOPHAGOGASTRODUODENOSCOPY (EGD) WITH PROPOFOL;  Surgeon: Doran Stabler, MD;  Location: [facility];  Service: Gastroenterology;  Laterality: N/A;  . ESOPHAGOGASTRODUODENOSCOPY (EGD) WITH PROPOFOL N/A 04/11/2017   Procedure: ESOPHAGOGASTRODUODENOSCOPY (EGD) WITH PROPOFOL;  Surgeon: Mauri Pole, MD;  Location: [facility];  Service: Endoscopy;  Laterality: N/A;  . FLEXIBLE SIGMOIDOSCOPY N/A 06/25/2017   Procedure: FLEXIBLE SIGMOIDOSCOPY;  Surgeon: Milus Banister, MD;  Location: [facility];  Service: Endoscopy;  Laterality: N/A;  . JOINT REPLACEMENT    . nissan fundoplication  26/7124    . ORIF HIP FRACTURE    . TUBAL LIGATION         History of present illness and  Hospital Course:     Kindly see H&P for history of present illness and admission details, please review complete Labs, Consult reports and Test reports for all details in brief  HPI  from the history and physical done on the day of admission 06/22/2017  HPI: Katherine Flynn is a 83 y.o. female with medical history significant of hypothyroidism, PUD s/p billroth II gastrojejunostoy, reflux, esophagitis, pyloric stenosis, history of Nissen fundoplication, diverticulosis, with recent admission for an upper GI bleed secondary to a gastrojejunal anastomotic ulcer which was felt to be likely NSAID related who is presenting with 3 weeks of diarrhea.  Diarrhea has been present for about 3 weeks at this point time. She has several episodes of diarrhea today requiring her to be changed 3-4 times a day.  Diarrhea was clear one point in time, but now is more brownish. No bladder melanoma noted. She also describes a stabbing lower abdominal pain that moves around. She also comes and goes and last  2-3 hours with comes on. She describes this as sometimes worse when eating and occasionally better after bowel movements. She denies any fevers, nausea, vomiting, chest pain, shortness of breath. Her appetite has been decreased for the past few months. She does have some incontinence, but no clear urinary symptoms. She denies recent antibiotic use. She denies sick contacts. She lives at home alone. A neighbor helps her with daily activities.   They've tried Imodium and Kaopectate for the diarrhea which hasn't really helped. She's had significant weight loss over the past month.  ED Course: Labs, IV fluids, potassium.  [place] a 83 y.o.femalewith medical history significant of hypothyroidism, PUD s/p billroth II gastrojejunostoy, reflux, esophagitis, pyloric stenosis, history of Nissen  fundoplication, diverticulosis, with recent admission for an upper GI bleed secondary to a gastrojejunal anastomotic ulcer which was felt to be likely NSAID related who is presenting with 3 weeks of diarrhea  Subacute to chronic diarrhea a candidate for lymphocytic colitis -  ongoing now for 3 weeks.negative for C. difficile on 06/14/2017 and GI office, GI pathogen panel negative, lactoferrin is negative as well, ,seen by  GI , flexible sigmoidoscopy on 06/25/2017 as there was mild inflammation noted on CT scan in the colon area, question ischemia versus inflammation. - Pathology significant for lymphocytic colitis, she remains with significant diarrhea despite being on Imodium,she was started on budesonide, currently on 9 mg oral daily, to continue for 4 weeks, and to be seen by GI as an outpatient within 3 weeks regarding further taper.   Gastric bypass surgery with GJ anastomotic ulcer. -  Continue twice a day PPI and monitor clinically.  Hypokalemia. - Replaced and stable.   Anemia of chronic and iron deficiency. - Started on iron supplements  UTI with leukocytosis. -  Finished 3 days of Rocephin.  Unintentional weight loss due to diarrhea. Monitor, once diarrhea but her protein supplementation.   Possible MAC infection noted on CT scan. Supportive care, not a candidate for multiple antibiotics at this time.    Discharge Condition:  stable   Follow UP  Follow-up Information    [facility].   Specialty:  [place] Why:  RN, PT, OT, ST, NA, SW Contact information: [place]  [id number] [phone]             Discharge Instructions  and  Discharge Medications    Discharge Instructions    Discharge instructions    Complete by:  As directed    Follow with Primary MD Marletta Lor, MD after discharge from SNF  Get CBC, CMP, checked  by Primary MD next visit.    Activity: As tolerated with Full  fall precautions use walker/cane & assistance as needed   Disposition SNF   Diet: mechanical soft diet, can be changed to regulardiet when she gets her dentures  For Heart failure patients - Check your Weight same time everyday, if you gain over 2 pounds, or you develop in leg swelling, experience more shortness of breath or chest pain, call your Primary MD immediately. Follow Cardiac Low Salt Diet and 1.5 lit/day fluid restriction.   On your next visit with your primary care physician please Get Medicines reviewed and adjusted.   Please request your Prim.MD to go over all Hospital Tests and Procedure/Radiological results at the follow up, please get all Hospital records sent to your Prim MD by signing hospital release before you go home.  If you experience worsening of your admission symptoms, develop shortness of breath, life threatening emergency, suicidal or homicidal thoughts you must seek medical attention immediately by calling 911 or calling your MD immediately  if symptoms less severe.  You Must read complete instructions/literature along with all the possible adverse reactions/side effects for all the Medicines you take and that have been prescribed to you. Take any new Medicines after you have completely understood and accpet all the possible adverse reactions/side effects.   Do not drive, operating heavy machinery, perform activities at heights, swimming or participation in water activities or provide baby sitting services if your were admitted for syncope or siezures until you have seen by Primary MD or a Neurologist and advised to do so again.  Do not drive when taking Pain medications.    Do not take more than prescribed Pain, Sleep and Anxiety Medications  Special Instructions: If you have smoked or chewed Tobacco  in the last 2 yrs please stop smoking, stop any regular Alcohol  and or any Recreational drug use.  Wear Seat belts while driving.   Please note  You  were cared for by a hospitalist during your hospital stay. If you have any questions about your discharge medications or the care you received while you were in the hospital after you are discharged, you can call the unit and asked to speak with the hospitalist on call if the hospitalist that took care of you is not available. Once you are discharged, your primary care physician will handle any further medical issues. Please note that NO REFILLS for any discharge medications will be authorized once you are discharged, as it is imperative that you return to your primary care physician (or establish a relationship with a primary care physician if you do not have one) for your aftercare needs so that they can reassess your need for medications and monitor your lab values.   Increase activity slowly    Complete by:  As directed      Allergies as of 06/28/2017   No Known Allergies     Medication List    TAKE these medications   acetaminophen 500 MG tablet Commonly known as:  TYLENOL Take 1,000 mg by mouth every 6 (six) hours as needed.   albuterol 108 (90 Base) MCG/ACT inhaler Commonly known as:  PROVENTIL HFA;VENTOLIN HFA Inhale 2 puffs into the lungs every 6 (six) hours as needed for wheezing or shortness of breath.   amLODipine 5 MG tablet Commonly known as:  NORVASC Take 1 tablet (5 mg total) by mouth daily.   budesonide 3 MG 24 hr capsule Commonly known as:  ENTOCORT EC Take 3 capsules (9 mg total) by mouth daily.   collagenase ointment Commonly known as:  SANTYL Apply 1 application topically daily.   feeding supplement (ENSURE ENLIVE) Liqd Take 237 mLs by mouth 2 (two) times daily between meals.   IMODIUM A-D 2 MG tablet Generic drug:  loperamide Take 2-4 mg by mouth 4 (four) times daily as needed for diarrhea or loose stools.   levothyroxine 100 MCG tablet Commonly known as:  SYNTHROID, LEVOTHROID TAKE ONE TABLET BY MOUTH ONCE DAILY   metoprolol tartrate 25 MG  tablet Commonly known as:  LOPRESSOR Take 1 tablet (25 mg total) by mouth 2 (two) times daily.   multivitamin with minerals Tabs tablet Take 1 tablet by mouth daily.   pantoprazole 40 MG tablet Commonly known as:  PROTONIX Take 1 tablet (40 mg total) by mouth  2 (two) times daily.   psyllium 95 % Pack Commonly known as:  HYDROCIL/METAMUCIL Take 1 packet by mouth daily.   sucralfate 1 GM/10ML suspension Commonly known as:  CARAFATE Take 10 mLs (1 g total) by mouth 4 (four) times daily -  with meals and at bedtime.         Diet and Activity recommendation: See Discharge Instructions above   Consults obtained - GI   Major procedures and Radiology Reports - PLEASE review detailed and final reports for all details, in brief -    Flexible Sigmoidoscopy Indications:              "Diarrhea" Providers:                Milus Banister, MD, Carolynn Comment RN, RN,                            Cherylynn Ridges, Technician, Glenis Smoker, CRNA Referring MD:              Medicines:                Monitored Anesthesia Care Complications:            No immediate complications. Estimated blood loss:                            None. Estimated Blood Loss:     Estimated blood loss: none. Procedure:                Pre-Anesthesia Assessment:                           - Prior to the procedure, a History and Physical                            was performed, and patient medications and                            allergies were reviewed. The patient's tolerance of                            previous anesthesia was also reviewed. The risks                            and benefits of the procedure and the sedation                            options and risks were discussed with the patient.                            All questions were answered, and informed consent                            was obtained. Prior Anticoagulants: The patient has                            taken no previous  anticoagulant or antiplatelet  agents. ASA Grade Assessment: IV - A patient with                            severe systemic disease that is a constant threat                            to life. After reviewing the risks and benefits,                            the patient was deemed in satisfactory condition to                            undergo the procedure.                           After obtaining informed consent, the scope was                            passed under direct vision. The EC-[id number] ([id number])                            scope was introduced through the anus and advanced                            to the the descending colon. The flexible                            sigmoidoscopy was accomplished without difficulty.                            The patient tolerated the procedure well. The                            quality of the bowel preparation was inadequate. Scope In: 3:20:14 PM Scope Out: 3:25:38 PM Total Procedure Duration: 0 hours 5 minutes 24 seconds  Findings:      There was solid, scibbolous appearing stools throughout the examined       colon (to about the proximal descending colon). Solid stool precluded       more proximal inspection.      Multiple small-mouthed diverticula were found in the sigmoid colon.      Biopsies were taken randomly from the left colon with a cold forceps for       histology.      The exam was otherwise without abnormality. Impression:               - Preparation of the colon was inadequate. There                            was solid, scibbolous stool throughout the examined                            colon.                           -  Diverticulosis in the sigmoid colon.                           - The left colon was randomly biopsied to check for                            microscopic colitis.                           - The examination was otherwise normal. Moderate Sedation:      N/A- Per Anesthesia  Care Recommendation:           - Return patient to hospital ward for ongoing care.                           - Advance diet as tolerated.                           - Continue present medications.                           - Will start fiber supplements today- attempt to                            bulk her stools.                           - Pending her clinical course and the biopsy                            results she may need a fully prepped colonoscopy. Procedure Code(s):        --- Professional ---                           [phone], Sigmoidoscopy, flexible; with biopsy, single                            or multiple Diagnosis Code(s):        --- Professional ---                           R19.7, Diarrhea, unspecified                           K57.30, Diverticulosis of large intestine without                            perforation or abscess without bleeding   Ct Abdomen Pelvis W Contrast  Result Date: 06/10/2017 CLINICAL DATA:  Increasing diarrhea.  Abdominal pain. EXAM: CT ABDOMEN AND PELVIS WITH CONTRAST TECHNIQUE: Multidetector CT imaging of the abdomen and pelvis was performed using the standard protocol following bolus administration of intravenous contrast. CONTRAST:  166m ISOVUE-300 IOPAMIDOL (ISOVUE-300) INJECTION 61% COMPARISON:  01/04/2017 FINDINGS: Lower chest: Chronic appearing atelectasis, bronchiectasis and tree-in-bud nodularity identified within bilateral lower lung zones. No pleural effusion. Hepatobiliary: No focal liver abnormality identified. The gallbladder appears within normal limits. There is mild chronic appearing  intrahepatic biliary dilatation. The appearance is unchanged from previous exam. The CBD measures up to 9 mm. No obstructing stone identified. Pancreas: The pancreatic duct appears increased in caliber measuring up to 6 mm. Similar to previous exam. No mass identified. No inflammation Spleen: Spleen is normal. Adrenals/Urinary Tract: The adrenal glands are  unremarkable. 3.7 cm lower pole kidney cyst is identified. No kidney mass or hydronephrosis identified. Left kidney cyst measures 1.2 cm. The urinary bladder is partially obscured by beam hardening artifact from right hip arthroplasty device. Stomach/Bowel: Upper GI anatomy is complex. The patient has undergone a Billroth II gastrojejunostomy. There is off marked inflammatory changes surrounding the gastrojejunostomy site. Ulcer crater, described on endoscopy from 04/15/2017 may still be present measuring approximately 2 cm, image 41 of series 6. Surrounding inflammatory changes involve the transverse colon resulting secondary colitis with wall edema and fat stranding. No pathologic dilatation of the small or large bowel loops identified. Vascular/Lymphatic: Aortic atherosclerosis peer no aneurysm. No adenopathy identified within the upper abdomen. No pelvic or inguinal adenopathy. Reproductive: Uterus is not well visualized.  No adnexal mass noted. Other: None Musculoskeletal: Previous right hip arthroplasty. Scoliosis and degenerative disc disease identified IMPRESSION: 1. Complex upper GI anatomy status post Billroth II gastrojejunostomy. 2. There are persistent inflammatory changes at the gastrojejunostomy site as demonstrated on upper endoscopy from 04/15/2017. Persistent ulceration within this area is suspected. No free intraperitoneal air identified. 3. Secondary inflammatory changes are noted involving the transverse colon which may explain patient's worsening diarrhea and abdominal pain. No significant free fluid or fluid collections identified. 4. Chronic postinflammatory changes within both lower lung zones compatible with indolent atypical infection such as mycobacterium avium complex. 5. Unchanged chronic dilatation of the common bile and pancreatic ducts. Electronically Signed   By: Kerby Moors M.D.   On: 06/10/2017 17:40    Micro Results     Recent Results (from the past 240 hour(s))   Gastrointestinal Panel by PCR , Stool     Status: None   Collection Time: 06/22/17  3:02 PM  Result Value Ref Range Status   Campylobacter species NOT DETECTED NOT DETECTED Final   Plesimonas shigelloides NOT DETECTED NOT DETECTED Final   Salmonella species NOT DETECTED NOT DETECTED Final   Yersinia enterocolitica NOT DETECTED NOT DETECTED Final   Vibrio species NOT DETECTED NOT DETECTED Final   Vibrio cholerae NOT DETECTED NOT DETECTED Final   Enteroaggregative E coli (EAEC) NOT DETECTED NOT DETECTED Final   Enteropathogenic E coli (EPEC) NOT DETECTED NOT DETECTED Final   Enterotoxigenic E coli (ETEC) NOT DETECTED NOT DETECTED Final   Shiga like toxin producing E coli (STEC) NOT DETECTED NOT DETECTED Final   Shigella/Enteroinvasive E coli (EIEC) NOT DETECTED NOT DETECTED Final   Cryptosporidium NOT DETECTED NOT DETECTED Final   Cyclospora cayetanensis NOT DETECTED NOT DETECTED Final   Entamoeba histolytica NOT DETECTED NOT DETECTED Final   Giardia lamblia NOT DETECTED NOT DETECTED Final   Adenovirus F40/41 NOT DETECTED NOT DETECTED Final   Astrovirus NOT DETECTED NOT DETECTED Final   Norovirus GI/GII NOT DETECTED NOT DETECTED Final   Rotavirus A NOT DETECTED NOT DETECTED Final   Sapovirus (I, II, IV, and V) NOT DETECTED NOT DETECTED Final  MRSA PCR Screening     Status: None   Collection Time: 06/26/17  9:19 AM  Result Value Ref Range Status   MRSA by PCR NEGATIVE NEGATIVE Final    Comment:        The GeneXpert MRSA Assay (  FDA approved for NASAL specimens only), is one component of a comprehensive MRSA colonization surveillance program. It is not intended to diagnose MRSA infection nor to guide or monitor treatment for MRSA infections.        Today   Subjective:   Katherine Flynn todayno acute events overnight, diarrhea has improved, 3 small amount soft bowel movements overnight, no nausea, vomiting, no chest pain or shortness of breath.   Objective:   Blood  pressure (!) 142/61, pulse 69, temperature 98.2 F (36.8 C), temperature source Oral, resp. rate 18, height 5' (1.524 m), weight 34.9 kg (77 lb), SpO2 99 %.   Intake/Output Summary (Last 24 hours) at 06/28/17 1135 Last data filed at 06/28/17 0550  Gross per 24 hour  Intake              450 ml  Output                0 ml  Net              450 ml    Exam Awake alert oriented 2, frail elderly female, lying in bed in no apparent distress Symmetrical Chest wall movement, Good air movement bilaterally, CTAB RRR,No Gallops,Rubs or new Murmurs, No Parasternal Heave +ve B.Sounds, Abd Soft, Non tender,No rebound -guarding or rigidity. No Cyanosis, Clubbing or edema, No new Rash or bruise  Data Review   CBC w Diff:  Lab Results  Component Value Date   WBC 4.7 06/27/2017   HGB 8.6 (L) 06/27/2017   HCT 27.7 (L) 06/27/2017   PLT 467 (H) 06/27/2017   LYMPHOPCT 6 06/22/2017   MONOPCT 5 06/22/2017   EOSPCT 0 06/22/2017   BASOPCT 0 06/22/2017    CMP:  Lab Results  Component Value Date   NA 138 06/27/2017   K 3.5 06/27/2017   CL 106 06/27/2017   CO2 25 06/27/2017   BUN 11 06/27/2017   CREATININE 0.60 06/27/2017   PROT 6.2 (L) 06/23/2017   ALBUMIN 2.7 (L) 06/23/2017   BILITOT 0.4 06/23/2017   ALKPHOS 101 06/23/2017   AST 21 06/23/2017   ALT 14 06/23/2017  .   Total Time in preparing paper work, data evaluation and todays exam - 35 minutes  Shahara Hartsfield M.D on 06/28/2017 at 11:35 AM  [facility]   Office  [phone]

## 2017-06-28 NOTE — Progress Notes (Signed)
Pt going to SNF in am. No bed for today. CSW following.

## 2017-06-28 NOTE — Progress Notes (Addendum)
10:34am CSW left voicemail for patient to son-Katherine Flynn(POA) to provide list of SNF offers. Waiting for return call.   Patient sons spouse Katherine Flynn returned Child psychotherapistsocial worker call and reports pt. [facility]  She report the patient son-Katherine Flynn or Daughter Katherine Flynn would help with pt. Transition to SNF.  CSW called patient daughter Katherine Flynn and explain the patient is ready for discharge, She reports the family cannot pay privately. The patient is not safe at home alone.   The patient will need prior insurance authorization before going to SNF.  CSW talked w/ Medical Director he approved LOG.   CSW explain SNF options to patient daughter, she is agreeable to Facility choice [facility].  CSW informed liaison, she has started authorization. She is agreeable to LOG.   Katherine Flynn, Katherine Flynn, MSW Clinical Social Worker  [phone] 06/28/2017  10:34 AM

## 2017-06-29 DIAGNOSIS — K279 Peptic ulcer, site unspecified, unspecified as acute or chronic, without hemorrhage or perforation: Secondary | ICD-10-CM | POA: Diagnosis not present

## 2017-06-29 DIAGNOSIS — D500 Iron deficiency anemia secondary to blood loss (chronic): Secondary | ICD-10-CM | POA: Diagnosis not present

## 2017-06-29 DIAGNOSIS — R1311 Dysphagia, oral phase: Secondary | ICD-10-CM | POA: Diagnosis not present

## 2017-06-29 DIAGNOSIS — K591 Functional diarrhea: Secondary | ICD-10-CM | POA: Diagnosis not present

## 2017-06-29 DIAGNOSIS — R1084 Generalized abdominal pain: Secondary | ICD-10-CM | POA: Diagnosis not present

## 2017-06-29 DIAGNOSIS — K922 Gastrointestinal hemorrhage, unspecified: Secondary | ICD-10-CM | POA: Diagnosis not present

## 2017-06-29 DIAGNOSIS — R634 Abnormal weight loss: Secondary | ICD-10-CM | POA: Diagnosis not present

## 2017-06-29 DIAGNOSIS — D649 Anemia, unspecified: Secondary | ICD-10-CM | POA: Diagnosis not present

## 2017-06-29 DIAGNOSIS — R2689 Other abnormalities of gait and mobility: Secondary | ICD-10-CM | POA: Diagnosis not present

## 2017-06-29 DIAGNOSIS — F0390 Unspecified dementia without behavioral disturbance: Secondary | ICD-10-CM | POA: Diagnosis not present

## 2017-06-29 DIAGNOSIS — M1990 Unspecified osteoarthritis, unspecified site: Secondary | ICD-10-CM | POA: Diagnosis not present

## 2017-06-29 DIAGNOSIS — R41841 Cognitive communication deficit: Secondary | ICD-10-CM | POA: Diagnosis not present

## 2017-06-29 DIAGNOSIS — K52832 Lymphocytic colitis: Secondary | ICD-10-CM | POA: Diagnosis not present

## 2017-06-29 DIAGNOSIS — E876 Hypokalemia: Secondary | ICD-10-CM | POA: Diagnosis not present

## 2017-06-29 DIAGNOSIS — J449 Chronic obstructive pulmonary disease, unspecified: Secondary | ICD-10-CM | POA: Diagnosis not present

## 2017-06-29 DIAGNOSIS — R531 Weakness: Secondary | ICD-10-CM | POA: Diagnosis not present

## 2017-06-29 DIAGNOSIS — E46 Unspecified protein-calorie malnutrition: Secondary | ICD-10-CM | POA: Diagnosis not present

## 2017-06-29 DIAGNOSIS — K649 Unspecified hemorrhoids: Secondary | ICD-10-CM | POA: Diagnosis not present

## 2017-06-29 DIAGNOSIS — K219 Gastro-esophageal reflux disease without esophagitis: Secondary | ICD-10-CM | POA: Diagnosis not present

## 2017-06-29 DIAGNOSIS — K289 Gastrojejunal ulcer, unspecified as acute or chronic, without hemorrhage or perforation: Secondary | ICD-10-CM | POA: Diagnosis not present

## 2017-06-29 DIAGNOSIS — R197 Diarrhea, unspecified: Secondary | ICD-10-CM | POA: Diagnosis not present

## 2017-06-29 DIAGNOSIS — K529 Noninfective gastroenteritis and colitis, unspecified: Secondary | ICD-10-CM | POA: Diagnosis not present

## 2017-06-29 DIAGNOSIS — R278 Other lack of coordination: Secondary | ICD-10-CM | POA: Diagnosis not present

## 2017-06-29 DIAGNOSIS — I10 Essential (primary) hypertension: Secondary | ICD-10-CM | POA: Diagnosis not present

## 2017-06-29 DIAGNOSIS — E039 Hypothyroidism, unspecified: Secondary | ICD-10-CM | POA: Diagnosis not present

## 2017-06-29 NOTE — Discharge Summary (Signed)
Katherine Flynn, is a 83 y.o. female  DOB [birth year]  MRN [id number].  Admission date:  06/22/2017  Admitting Physician  A Melven Sartorius., MD  Discharge Date:  06/29/2017   Primary MD  Marletta Lor, MD  No significant updates from discharge summary dictated yesterday, patient was not  discharged as she was awaiting for bed availability.  Recommendations for primary care physician for things to follow:  - stick CBC, BMP in 3 days - start tapering budesonideafter4 weeks - Please give when necessary Imodium for diarrhea  CODE STATUS: DO NOT RESUSCITATE   Admission Diagnosis  bowel issue Diarrhea, IDA, Abnormal weight loss   Discharge Diagnosis  bowel issue Diarrhea, IDA, Abnormal weight loss    Principal Problem:   Diarrhea Active Problems:   Hypothyroidism   Essential hypertension   GERD   PUD (peptic ulcer disease)   Abdominal pain   Pyuria   Diverticulosis of colon without hemorrhage      Past Medical History:  Diagnosis Date  . Chronic pain syndrome   . COPD (chronic obstructive pulmonary disease) ([place])   . GERD (gastroesophageal reflux disease)   . History of pyloric stenosis   . Hypertension   . Macular degeneration   . Osteoarthritis   . Thyroid disease   . Weight loss     Past Surgical History:  Procedure Laterality Date  . APPENDECTOMY    . CATARACT EXTRACTION    . ESOPHAGOGASTRODUODENOSCOPY (EGD) WITH PROPOFOL N/A 04/07/2017   Procedure: ESOPHAGOGASTRODUODENOSCOPY (EGD) WITH PROPOFOL;  Surgeon: Doran Stabler, MD;  Location: [facility];  Service: Gastroenterology;  Laterality: N/A;  . ESOPHAGOGASTRODUODENOSCOPY (EGD) WITH PROPOFOL N/A 04/11/2017   Procedure: ESOPHAGOGASTRODUODENOSCOPY (EGD) WITH PROPOFOL;  Surgeon: Mauri Pole, MD;  Location: [facility];  Service: Endoscopy;  Laterality: N/A;  . FLEXIBLE SIGMOIDOSCOPY N/A 06/25/2017   Procedure: FLEXIBLE SIGMOIDOSCOPY;  Surgeon: Milus Banister, MD;  Location: [facility];  Service: Endoscopy;  Laterality: N/A;  . JOINT REPLACEMENT    . nissan fundoplication  76/1607  . ORIF HIP FRACTURE    . TUBAL LIGATION         History of present illness and  Hospital Course:     Kindly see H&P for history of present illness and admission details, please review complete Labs, Consult reports and Test reports for all details in brief  HPI  from the history and physical done on the day of admission 06/22/2017  HPI: Katherine Flynn is a 83 y.o. female with medical history significant of hypothyroidism, PUD s/p billroth II gastrojejunostoy, reflux, esophagitis, pyloric stenosis, history of Nissen fundoplication, diverticulosis, with recent admission for an upper GI bleed secondary to a gastrojejunal anastomotic ulcer which was felt to be likely NSAID related who is presenting with 3 weeks of diarrhea.  Diarrhea has been present for about 3 weeks at this point time. She has several episodes of diarrhea today requiring her to be changed 3-4 times a day.  Diarrhea was clear one point in time,  but now is more brownish. No bladder melanoma noted. She also describes a stabbing lower abdominal pain that moves around. She also comes and goes and last 2-3 hours with comes on. She describes this as sometimes worse when eating and occasionally better after bowel movements. She denies any fevers, nausea, vomiting, chest pain, shortness of breath. Her appetite has been decreased for the past few months. She does have some incontinence, but no clear urinary symptoms. She denies recent antibiotic use. She denies sick contacts. She lives at home alone. A neighbor helps her with daily activities.   They've tried Imodium and Kaopectate for the diarrhea which hasn't really helped. She's had significant weight loss over the past month.  ED Course: Labs, IV fluids, potassium.  Goodman a 83 y.o.femalewith medical history significant of hypothyroidism, PUD s/p billroth II gastrojejunostoy, reflux, esophagitis, pyloric stenosis, history of Nissen fundoplication, diverticulosis, with recent admission for an upper GI bleed secondary to a gastrojejunal anastomotic ulcer which was felt to be likely NSAID related who is presenting with 3 weeks of diarrhea  Subacute to chronic diarrhea a candidate for lymphocytic colitis -  ongoing now for 3 weeks.negative for C. difficile on 06/14/2017 and GI office, GI pathogen panel negative, lactoferrin is negative as well, ,seen by  GI , flexible sigmoidoscopy on 06/25/2017 as there was mild inflammation noted on CT scan in the colon area, question ischemia versus inflammation. - Pathology significant for lymphocytic colitis, she remains with significant diarrhea despite being on Imodium,she was started on budesonide, currently on 9 mg oral daily, to continue for 4 weeks, and to be seen by GI as an outpatient within 3 weeks regarding further taper.   Gastric bypass surgery with GJ anastomotic ulcer. -  Continue twice a day PPI and monitor clinically.  Hypokalemia. - Replaced and stable.   Anemia of chronic and iron deficiency. - Started on iron supplements  UTI with leukocytosis. -  Finished 3 days of Rocephin.  Unintentional weight loss due to diarrhea. Monitor, once diarrhea but her protein supplementation.   Possible MAC infection noted on CT scan. Supportive care, not a candidate for multiple antibiotics at this time.    Discharge Condition:  stable   Follow UP   Contact information for follow-up providers    [facility] Follow up.   Specialty:  [place]:  RN, PT, OT, ST, NA, SW Contact information: [place] [address] [phone]        Nelida Meuse III, MD Follow up in 3 day(s).   Specialty:  Gastroenterology Contact  information: [address] [address] [phone]            Contact information for after-discharge care    Destination    [facility] .   Specialty:  [place] information: [place] [phone]                    Discharge Instructions  and  Discharge Medications    Discharge Instructions    Discharge instructions    Complete by:  As directed    Follow with Primary MD Marletta Lor, MD after discharge from SNF  Get CBC, CMP, checked  by Primary MD next visit.    Activity: As tolerated with Full fall precautions use walker/cane & assistance as needed   Disposition SNF  Diet: mechanical soft diet, can be changed to regulardiet when she gets her dentures  For Heart failure patients - Check your Weight same time everyday, if you gain over 2 pounds, or you develop in leg swelling, experience more shortness of breath or chest pain, call your Primary MD immediately. Follow Cardiac Low Salt Diet and 1.5 lit/day fluid restriction.   On your next visit with your primary care physician please Get Medicines reviewed and adjusted.   Please request your Prim.MD to go over all Hospital Tests and Procedure/Radiological results at the follow up, please get all Hospital records sent to your Prim MD by signing hospital release before you go home.   If you experience worsening of your admission symptoms, develop shortness of breath, life threatening emergency, suicidal or homicidal thoughts you must seek medical attention immediately by calling 911 or calling your MD immediately  if symptoms less severe.  You Must read complete instructions/literature along with all the possible adverse reactions/side effects for all the Medicines you take and that have been prescribed to you. Take any new Medicines after you have completely understood and accpet all the  possible adverse reactions/side effects.   Do not drive, operating heavy machinery, perform activities at heights, swimming or participation in water activities or provide baby sitting services if your were admitted for syncope or siezures until you have seen by Primary MD or a Neurologist and advised to do so again.  Do not drive when taking Pain medications.    Do not take more than prescribed Pain, Sleep and Anxiety Medications  Special Instructions: If you have smoked or chewed Tobacco  in the last 2 yrs please stop smoking, stop any regular Alcohol  and or any Recreational drug use.  Wear Seat belts while driving.   Please note  You were cared for by a hospitalist during your hospital stay. If you have any questions about your discharge medications or the care you received while you were in the hospital after you are discharged, you can call the unit and asked to speak with the hospitalist on call if the hospitalist that took care of you is not available. Once you are discharged, your primary care physician will handle any further medical issues. Please note that NO REFILLS for any discharge medications will be authorized once you are discharged, as it is imperative that you return to your primary care physician (or establish a relationship with a primary care physician if you do not have one) for your aftercare needs so that they can reassess your need for medications and monitor your lab values.   Increase activity slowly    Complete by:  As directed      Allergies as of 06/29/2017   No Known Allergies     Medication List    TAKE these medications   acetaminophen 500 MG tablet Commonly known as:  TYLENOL Take 1,000 mg by mouth every 6 (six) hours as needed.   albuterol 108 (90 Base) MCG/ACT inhaler Commonly known as:  PROVENTIL HFA;VENTOLIN HFA Inhale 2 puffs into the lungs every 6 (six) hours as needed for wheezing or shortness of breath.   amLODipine 5 MG tablet Commonly  known as:  NORVASC Take 1 tablet (5 mg total) by mouth daily.   budesonide 3 MG 24 hr capsule Commonly known as:  ENTOCORT EC Take 3 capsules (9 mg total) by mouth daily.   collagenase ointment Commonly known as:  SANTYL Apply 1 application topically daily.   feeding  supplement (ENSURE ENLIVE) Liqd Take 237 mLs by mouth 2 (two) times daily between meals.   IMODIUM A-D 2 MG tablet Generic drug:  loperamide Take 2-4 mg by mouth 4 (four) times daily as needed for diarrhea or loose stools.   levothyroxine 100 MCG tablet Commonly known as:  SYNTHROID, LEVOTHROID TAKE ONE TABLET BY MOUTH ONCE DAILY   metoprolol tartrate 25 MG tablet Commonly known as:  LOPRESSOR Take 1 tablet (25 mg total) by mouth 2 (two) times daily.   multivitamin with minerals Tabs tablet Take 1 tablet by mouth daily.   pantoprazole 40 MG tablet Commonly known as:  PROTONIX Take 1 tablet (40 mg total) by mouth 2 (two) times daily.   psyllium 95 % Pack Commonly known as:  HYDROCIL/METAMUCIL Take 1 packet by mouth daily.   sucralfate 1 GM/10ML suspension Commonly known as:  CARAFATE Take 10 mLs (1 g total) by mouth 4 (four) times daily -  with meals and at bedtime.         Diet and Activity recommendation: See Discharge Instructions above   Consults obtained - GI   Major procedures and Radiology Reports - PLEASE review detailed and final reports for all details, in brief -    Flexible Sigmoidoscopy Indications:              "Diarrhea" Providers:                Milus Banister, MD, Carolynn Comment RN, RN,                            Cherylynn Ridges, Technician, Glenis Smoker, CRNA Referring MD:              Medicines:                Monitored Anesthesia Care Complications:            No immediate complications. Estimated blood loss:                            None. Estimated Blood Loss:     Estimated blood loss: none. Procedure:                Pre-Anesthesia Assessment:                            - Prior to the procedure, a History and Physical                            was performed, and patient medications and                            allergies were reviewed. The patient's tolerance of                            previous anesthesia was also reviewed. The risks                            and benefits of the procedure and the sedation                            options and risks were discussed  with the patient.                            All questions were answered, and informed consent                            was obtained. Prior Anticoagulants: The patient has                            taken no previous anticoagulant or antiplatelet                            agents. ASA Grade Assessment: IV - A patient with                            severe systemic disease that is a constant threat                            to life. After reviewing the risks and benefits,                            the patient was deemed in satisfactory condition to                            undergo the procedure.                           After obtaining informed consent, the scope was                            passed under direct vision. The EC-[id number] ([id number])                            scope was introduced through the anus and advanced                            to the the descending colon. The flexible                            sigmoidoscopy was accomplished without difficulty.                            The patient tolerated the procedure well. The                            quality of the bowel preparation was inadequate. Scope In: 3:20:14 PM Scope Out: 3:25:38 PM Total Procedure Duration: 0 hours 5 minutes 24 seconds  Findings:      There was solid, scibbolous appearing stools throughout the examined       colon (to about the proximal descending colon). Solid stool precluded       more proximal inspection.      Multiple small-mouthed diverticula were found in the sigmoid colon.       Biopsies were taken randomly from the left colon with a cold  forceps for       histology.      The exam was otherwise without abnormality. Impression:               - Preparation of the colon was inadequate. There                            was solid, scibbolous stool throughout the examined                            colon.                           - Diverticulosis in the sigmoid colon.                           - The left colon was randomly biopsied to check for                            microscopic colitis.                           - The examination was otherwise normal. Moderate Sedation:      N/A- Per Anesthesia Care Recommendation:           - Return patient to hospital ward for ongoing care.                           - Advance diet as tolerated.                           - Continue present medications.                           - Will start fiber supplements today- attempt to                            bulk her stools.                           - Pending her clinical course and the biopsy                            results she may need a fully prepped colonoscopy. Procedure Code(s):        --- Professional ---                           [phone], Sigmoidoscopy, flexible; with biopsy, single                            or multiple Diagnosis Code(s):        --- Professional ---                           R19.7, Diarrhea, unspecified                           K57.30,  Diverticulosis of large intestine without                            perforation or abscess without bleeding   Ct Abdomen Pelvis W Contrast  Result Date: 06/10/2017 CLINICAL DATA:  Increasing diarrhea.  Abdominal pain. EXAM: CT ABDOMEN AND PELVIS WITH CONTRAST TECHNIQUE: Multidetector CT imaging of the abdomen and pelvis was performed using the standard protocol following bolus administration of intravenous contrast. CONTRAST:  164m ISOVUE-300 IOPAMIDOL (ISOVUE-300) INJECTION 61% COMPARISON:  01/04/2017 FINDINGS: Lower  chest: Chronic appearing atelectasis, bronchiectasis and tree-in-bud nodularity identified within bilateral lower lung zones. No pleural effusion. Hepatobiliary: No focal liver abnormality identified. The gallbladder appears within normal limits. There is mild chronic appearing intrahepatic biliary dilatation. The appearance is unchanged from previous exam. The CBD measures up to 9 mm. No obstructing stone identified. Pancreas: The pancreatic duct appears increased in caliber measuring up to 6 mm. Similar to previous exam. No mass identified. No inflammation Spleen: Spleen is normal. Adrenals/Urinary Tract: The adrenal glands are unremarkable. 3.7 cm lower pole kidney cyst is identified. No kidney mass or hydronephrosis identified. Left kidney cyst measures 1.2 cm. The urinary bladder is partially obscured by beam hardening artifact from right hip arthroplasty device. Stomach/Bowel: Upper GI anatomy is complex. The patient has undergone a Billroth II gastrojejunostomy. There is off marked inflammatory changes surrounding the gastrojejunostomy site. Ulcer crater, described on endoscopy from 04/15/2017 may still be present measuring approximately 2 cm, image 41 of series 6. Surrounding inflammatory changes involve the transverse colon resulting secondary colitis with wall edema and fat stranding. No pathologic dilatation of the small or large bowel loops identified. Vascular/Lymphatic: Aortic atherosclerosis peer no aneurysm. No adenopathy identified within the upper abdomen. No pelvic or inguinal adenopathy. Reproductive: Uterus is not well visualized.  No adnexal mass noted. Other: None Musculoskeletal: Previous right hip arthroplasty. Scoliosis and degenerative disc disease identified IMPRESSION: 1. Complex upper GI anatomy status post Billroth II gastrojejunostomy. 2. There are persistent inflammatory changes at the gastrojejunostomy site as demonstrated on upper endoscopy from 04/15/2017. Persistent ulceration  within this area is suspected. No free intraperitoneal air identified. 3. Secondary inflammatory changes are noted involving the transverse colon which may explain patient's worsening diarrhea and abdominal pain. No significant free fluid or fluid collections identified. 4. Chronic postinflammatory changes within both lower lung zones compatible with indolent atypical infection such as mycobacterium avium complex. 5. Unchanged chronic dilatation of the common bile and pancreatic ducts. Electronically Signed   By: TKerby MoorsM.D.   On: 06/10/2017 17:40    Micro Results     Recent Results (from the past 240 hour(s))  Gastrointestinal Panel by PCR , Stool     Status: None   Collection Time: 06/22/17  3:02 PM  Result Value Ref Range Status   Campylobacter species NOT DETECTED NOT DETECTED Final   Plesimonas shigelloides NOT DETECTED NOT DETECTED Final   Salmonella species NOT DETECTED NOT DETECTED Final   Yersinia enterocolitica NOT DETECTED NOT DETECTED Final   Vibrio species NOT DETECTED NOT DETECTED Final   Vibrio cholerae NOT DETECTED NOT DETECTED Final   Enteroaggregative E coli (EAEC) NOT DETECTED NOT DETECTED Final   Enteropathogenic E coli (EPEC) NOT DETECTED NOT DETECTED Final   Enterotoxigenic E coli (ETEC) NOT DETECTED NOT DETECTED Final   Shiga like toxin producing E coli (STEC) NOT DETECTED NOT DETECTED Final   Shigella/Enteroinvasive E coli (EIEC) NOT DETECTED NOT DETECTED  Final   Cryptosporidium NOT DETECTED NOT DETECTED Final   Cyclospora cayetanensis NOT DETECTED NOT DETECTED Final   Entamoeba histolytica NOT DETECTED NOT DETECTED Final   Giardia lamblia NOT DETECTED NOT DETECTED Final   Adenovirus F40/41 NOT DETECTED NOT DETECTED Final   Astrovirus NOT DETECTED NOT DETECTED Final   Norovirus GI/GII NOT DETECTED NOT DETECTED Final   Rotavirus A NOT DETECTED NOT DETECTED Final   Sapovirus (I, II, IV, and V) NOT DETECTED NOT DETECTED Final  MRSA PCR Screening     Status:  None   Collection Time: 06/26/17  9:19 AM  Result Value Ref Range Status   MRSA by PCR NEGATIVE NEGATIVE Final    Comment:        The GeneXpert MRSA Assay (FDA approved for NASAL specimens only), is one component of a comprehensive MRSA colonization surveillance program. It is not intended to diagnose MRSA infection nor to guide or monitor treatment for MRSA infections.        Today   Subjective:   Katherine Flynn todayno acute events overnight, diarrhea has improved, 3 small amount soft bowel movements overnight, no nausea, vomiting, no chest pain or shortness of breath.   Objective:   Blood pressure (!) 141/61, pulse 69, temperature 98.5 F (36.9 C), temperature source Oral, resp. rate 18, height 5' (1.524 m), weight 34.9 kg (77 lb), SpO2 99 %.   Intake/Output Summary (Last 24 hours) at 06/29/17 1337 Last data filed at 06/28/17 1357  Gross per 24 hour  Intake              120 ml  Output                0 ml  Net              120 ml    Exam Awake alert oriented 2, frail elderly female, lying in bed in no apparent distress Symmetrical Chest wall movement, Good air movement bilaterally, CTAB RRR,No Gallops,Rubs or new Murmurs, No Parasternal Heave +ve B.Sounds, Abd Soft, Non tender,No rebound -guarding or rigidity. No Cyanosis, Clubbing or edema, No new Rash or bruise  Data Review   CBC w Diff:  Lab Results  Component Value Date   WBC 4.7 06/27/2017   HGB 8.6 (L) 06/27/2017   HCT 27.7 (L) 06/27/2017   PLT 467 (H) 06/27/2017   LYMPHOPCT 6 06/22/2017   MONOPCT 5 06/22/2017   EOSPCT 0 06/22/2017   BASOPCT 0 06/22/2017    CMP:  Lab Results  Component Value Date   NA 138 06/27/2017   K 3.5 06/27/2017   CL 106 06/27/2017   CO2 25 06/27/2017   BUN 11 06/27/2017   CREATININE 0.60 06/27/2017   PROT 6.2 (L) 06/23/2017   ALBUMIN 2.7 (L) 06/23/2017   BILITOT 0.4 06/23/2017   ALKPHOS 101 06/23/2017   AST 21 06/23/2017   ALT 14 06/23/2017  .   Total  Time in preparing paper work, data evaluation and todays exam -no charge  [facility], Kye Hedden M.D on 06/29/2017 at 1:37 PM  [facility]   Office  [phone]

## 2017-06-29 NOTE — Progress Notes (Signed)
Report called to Alisa at [facility]. Patient stable for discharge. [facility] transporting patient.   Arta BruceDeutsch, Colsen Modi [facility] 06/29/2017 3:01 PM

## 2017-06-29 NOTE — Plan of Care (Signed)
Problem: Skin Integrity: Goal: Risk for impaired skin integrity will decrease Outcome: Not Progressing Pt having frequent loose stools. Peri area red and irritated. Barrier cream applied.

## 2017-06-29 NOTE — Clinical Social Work Placement (Addendum)
   CLINICAL SOCIAL WORK PLACEMENT  NOTE  Date:  06/29/2017  Patient Details  Name: Katherine ShoutsBarbara A Tyndall MRN: [id number] Date of Birth: May 15, 1923  Clinical Social Work is seeking post-discharge placement for this patient at the Skilled  Nursing Facility level of care (*CSW will initial, date and re-position this form in  chart as items are completed):  Yes   Patient/family provided with [facility] Clinical Social Work Department's list of facilities offering this level of care within the geographic area requested by the patient (or if unable, by the patient's family).  Yes   Patient/family informed of their freedom to choose among providers that offer the needed level of care, that participate in Medicare, Medicaid or managed care program needed by the patient, have an available bed and are willing to accept the patient.  Yes   Patient/family informed of [facility]'s ownership interest in [facility] and [facility], as well as of the fact that they are under no obligation to receive care at these facilities.  PASRR submitted to EDS on       PASRR number received on       Existing PASRR number confirmed on 06/27/17     FL2 transmitted to all facilities in geographic area requested by pt/family on       FL2 transmitted to all facilities within larger geographic area on       Patient informed that his/her managed care company has contracts with or will negotiate with certain facilities, including the following:        Yes   Patient/family informed of bed offers received.  [facility]     Physician recommends and patient chooses bed at      [facility].  Patient to be transferred to facility by [facility]     Patient family notified on 06/29/17 of transfer.  Name of family member notified:      Daughter Cordelia PenSherry  PHYSICIAN Please sign DNR, Please prepare priority discharge  summary, including medications     Additional Comment:    _______________________________________________ Clearance CootsNicole A Jamyiah Labella, LCSW 06/29/2017, 1:14 PM

## 2017-06-30 DIAGNOSIS — K279 Peptic ulcer, site unspecified, unspecified as acute or chronic, without hemorrhage or perforation: Secondary | ICD-10-CM | POA: Diagnosis not present

## 2017-06-30 DIAGNOSIS — I10 Essential (primary) hypertension: Secondary | ICD-10-CM | POA: Diagnosis not present

## 2017-06-30 DIAGNOSIS — R197 Diarrhea, unspecified: Secondary | ICD-10-CM | POA: Diagnosis not present

## 2017-06-30 DIAGNOSIS — F0390 Unspecified dementia without behavioral disturbance: Secondary | ICD-10-CM | POA: Diagnosis not present

## 2017-06-30 DIAGNOSIS — K219 Gastro-esophageal reflux disease without esophagitis: Secondary | ICD-10-CM | POA: Diagnosis not present

## 2017-06-30 DIAGNOSIS — E46 Unspecified protein-calorie malnutrition: Secondary | ICD-10-CM | POA: Diagnosis not present

## 2017-06-30 DIAGNOSIS — R531 Weakness: Secondary | ICD-10-CM | POA: Diagnosis not present

## 2017-06-30 DIAGNOSIS — E039 Hypothyroidism, unspecified: Secondary | ICD-10-CM | POA: Diagnosis not present

## 2017-07-02 DIAGNOSIS — R531 Weakness: Secondary | ICD-10-CM | POA: Diagnosis not present

## 2017-07-02 DIAGNOSIS — K52832 Lymphocytic colitis: Secondary | ICD-10-CM | POA: Diagnosis not present

## 2017-07-02 DIAGNOSIS — I10 Essential (primary) hypertension: Secondary | ICD-10-CM | POA: Diagnosis not present

## 2017-07-02 DIAGNOSIS — K289 Gastrojejunal ulcer, unspecified as acute or chronic, without hemorrhage or perforation: Secondary | ICD-10-CM | POA: Diagnosis not present

## 2017-07-06 ENCOUNTER — Other Ambulatory Visit: Payer: Self-pay

## 2017-07-06 ENCOUNTER — Telehealth: Payer: Self-pay

## 2017-07-06 ENCOUNTER — Other Ambulatory Visit: Payer: Self-pay | Admitting: *Deleted

## 2017-07-06 DIAGNOSIS — R531 Weakness: Secondary | ICD-10-CM | POA: Diagnosis not present

## 2017-07-06 DIAGNOSIS — K52832 Lymphocytic colitis: Secondary | ICD-10-CM | POA: Diagnosis not present

## 2017-07-06 DIAGNOSIS — E876 Hypokalemia: Secondary | ICD-10-CM | POA: Diagnosis not present

## 2017-07-06 DIAGNOSIS — D500 Iron deficiency anemia secondary to blood loss (chronic): Secondary | ICD-10-CM | POA: Diagnosis not present

## 2017-07-06 NOTE — Patient Outreach (Signed)
   Katherine Flynn [facility]) Care Management Post-Acute Care Coordination  07/06/2017  [name] 1923-04-26 [id number]   Met with [name], SW for [facility]. Reviewed patient case. She confirms that patient will be a [place] resident of facility and there are no plans to discharge at this time.  RNCM will sign off case.  Royetta Crochet. Laymond Purser, RN, BSN, [facility] Post-Acute Care Coordinator [phone]

## 2017-07-06 NOTE — Telephone Encounter (Signed)
[facility] Ce[facility]ina Collins[facility] to report that patient is still have diarrhea despite taking Budesonide and imodium. Rescheduled sooner to be seen in office by one of our APP's on 07/10/17, instead of waiting until 12/14 to see Dr. Myrtie Neitheranis. Coral CeoGina Collins is asking to see if there is something else that can be done to help with the diarrhea. Her direct cell # is: [phone], thank you.

## 2017-07-06 NOTE — Telephone Encounter (Signed)
Perhaps she has some infection not picked up on stool testing.  Flagyl 500 mg , one tablet three times daily x 14 days.  This might upset her stomach and affect appetite.  Also, please find out what form of Budesonide they are giving her: Entocort 3 mg tablets or Uceris 9 mg tablets.  If it is Entocort, that is the wrong form of Budesonide and needs to be changed to Uceris 9 mg once daily.

## 2017-07-06 NOTE — Telephone Encounter (Signed)
Left message for Katherine Flynn on her cell phone with orders for flagyl 500 mg TID for 14 days, also make sure the Budesonide is the Uceris 9 mg daily. Asked that she call back to confirm received this order, if she has other questions asked her to lvm.

## 2017-07-10 ENCOUNTER — Ambulatory Visit: Payer: Medicare HMO | Admitting: Nurse Practitioner

## 2017-07-11 DIAGNOSIS — R531 Weakness: Secondary | ICD-10-CM | POA: Diagnosis not present

## 2017-07-11 DIAGNOSIS — K52832 Lymphocytic colitis: Secondary | ICD-10-CM | POA: Diagnosis not present

## 2017-07-11 DIAGNOSIS — I10 Essential (primary) hypertension: Secondary | ICD-10-CM | POA: Diagnosis not present

## 2017-07-11 DIAGNOSIS — K649 Unspecified hemorrhoids: Secondary | ICD-10-CM | POA: Diagnosis not present

## 2017-07-18 ENCOUNTER — Encounter: Payer: Self-pay | Admitting: Nurse Practitioner

## 2017-07-18 ENCOUNTER — Ambulatory Visit (INDEPENDENT_AMBULATORY_CARE_PROVIDER_SITE_OTHER): Payer: Medicare HMO | Admitting: Nurse Practitioner

## 2017-07-18 VITALS — BP 90/58 | HR 72 | Ht <= 58 in | Wt <= 1120 oz

## 2017-07-18 DIAGNOSIS — R634 Abnormal weight loss: Secondary | ICD-10-CM | POA: Diagnosis not present

## 2017-07-18 DIAGNOSIS — K52832 Lymphocytic colitis: Secondary | ICD-10-CM | POA: Diagnosis not present

## 2017-07-18 DIAGNOSIS — K279 Peptic ulcer, site unspecified, unspecified as acute or chronic, without hemorrhage or perforation: Secondary | ICD-10-CM | POA: Diagnosis not present

## 2017-07-18 NOTE — Patient Instructions (Addendum)
If you are age 81 or older, your body mass index should be between 23-30. Your Body mass index is 16.66 kg/m. If this is out of the aforementioned range listed, please consider follow up with your Primary Care Provider.  If you are age 81 or younger, your body mass index should be between 19-25. Your Body mass index is 16.66 kg/m. If this is out of the aformentioned range listed, please consider follow up with your Primary Care Provider.   STOP FLAGYL if taking for diarrhea.  Follow up with Willette ClusterPaula Guenther, NP on August 14, 2017 at 8:30 am.  Thank you for choosing me and [facility].   Willette ClusterPaula Guenther, NP  WE WILL BE SENDING THE OFFICE NOTE TO YOUR FACILITY.

## 2017-07-18 NOTE — Progress Notes (Addendum)
Chief Complaint:  Hospital follow up  HPI: Patient is a 83 yo female known to Dr. Myrtie Neitheranis. She has a hx of Bilroth 2 and was hospitalized with upper GI bleed due to a large anastomatic ulcer in late July.  She was hospitalized again in mid October with severe crampy diarrhea and weight loss over the preceding 8 weeks. WBC 12 ~ 13.   C. difficile was negative on 06/14/17. She underwent flex sig with biopsies with showed lymphocytic colitis.  Stool in colon so extent of flex sig was only to proximal descending colon . Findings were discussed with the patient in the hospital.  Her diarrhea had already stopped with Imodium and fiber so no further treatment was recommended at the time. However, it appears that she was discharged on Budesonide 9mg  daily.  [facility] called the office on 10/26 with complaints of ongoing diarrhea despite budesonide and imodium. At that time we added 14 days of flagyl and advised facility to make sure she was getting Euceris form of Budesonide.    Patient is here for hospital follow-up.  Her sons accompany her. Euceris is not on facility medication liist. She is getting the flagyl.   Per sons, patient has been having multiple episodes of loose stool every day for the last 8 weeks. They were unaware that her diarrhea was reported as being better while hospitalized.  Her appetite is poor.  She has lost weight. No nausea. No abdominal pain.    Past Medical History:  Diagnosis Date  . Chronic pain syndrome   . COPD (chronic obstructive pulmonary disease) (HCC)   . GERD (gastroesophageal reflux disease)   . History of pyloric stenosis   . Hypertension   . Macular degeneration   . Osteoarthritis   . Thyroid disease   . Weight loss     Patient's surgical history, family medical history, social history, medications and allergies were all reviewed in Epic    Physical Exam: BP (!) 90/58   Pulse 72   Ht 4\' 9"  (1.448 m)   BMI 16.66 kg/m   GENERAL:  Petite white  female in NAD PSYCH: :Pleasant, cooperative, normal affect EENT:  conjunctiva pink, mucous membranes moist, neck supple without masses CARDIAC:  RRR, no peripheral edema PULM: Normal respiratory effort, lungs CTA bilaterally, no wheezing ABDOMEN:  Nondistended, soft, nontender. No obvious masses, no hepatomegaly,  normal bowel sounds SKIN:  turgor, no lesions seen Musculoskeletal:  Normal muscle tone, normal strength NEURO: Alert and oriented x 3, no focal neurologic deficits    ASSESSMENT and PLAN:  1. Pleasant 83 year old with several weeks of debilitating diarrhea. Inpatient flex sig with biopsies suggesting lymphocytic colitis. Reviewing hospital discharge summary from 10/19 it seems she was on budesonide 9mg  daily from time of discharge until 10/26 when nurse from [facility] called office with complaints of diarrhea at which time we added flagyl. She is no longer on Uceris but I'm unsure why. We have placed a call to [facility] for clarity. Reaction to med? Lack of communication?   -stopping flagyl as she has been taking for over 10 days with no improvement in diarrhea -[facility]. If there is no documentation of side effect then I plan to restart Uceris.  -of note, a CT scan of the abdomen and pelvis done in ED late September demonstrates inflammatory changes about the transverse colon with bowel wall edema and fat stranding. We did not visualize this area on flex sig. If doesn't  improve back on Uceris then may need further evaluation of these CT scan findings. Her advanced age and fragility may complicated additional testing however.     2. Bilroth II . Upper GI bleed from anastomotic ulcer in July. CT scan in late Septnoted was some persistent inflammatory changes at gastrojejunostomy indicating persistent ulceration of the area. -continue protonix. No abdominal pain. No overt bleeding. -Would continue Carafate for another few weeks then should be safe to stop it.      Cc: Eleonore ChiquitoPeter, Kwiatkowski, MD   Willette ClusterPaula Larz Mark , NP 07/18/2017, 10:06 AM  Addendum: I spoke with Dr. Myrtie Neitheranis, patient's primary GI he would like her to be restarted on the Uceris 9 mg once daily. See nursing note from yesterday where they are waiting to hear from us on that.   Read your note - the inflammatory changes in mid transverse colon appear to be due to adjacent large anastomotic ulcer rather than focal colitis.   I believe her age a debility preclude colonoscopy or other invasive workup.  Please copy and paste this into an addendum to your 11/8 note for documentation purposes.   Ellwood DenseH. Danis, MD  07/20/17

## 2017-07-19 ENCOUNTER — Telehealth: Payer: Self-pay

## 2017-07-19 DIAGNOSIS — K289 Gastrojejunal ulcer, unspecified as acute or chronic, without hemorrhage or perforation: Secondary | ICD-10-CM | POA: Diagnosis not present

## 2017-07-19 DIAGNOSIS — R531 Weakness: Secondary | ICD-10-CM | POA: Diagnosis not present

## 2017-07-19 DIAGNOSIS — K52832 Lymphocytic colitis: Secondary | ICD-10-CM | POA: Diagnosis not present

## 2017-07-19 DIAGNOSIS — I10 Essential (primary) hypertension: Secondary | ICD-10-CM | POA: Diagnosis not present

## 2017-07-19 NOTE — Telephone Encounter (Signed)
I called [facility] this morning and spoke with Morrie SheldonAshley, Ms. Zook's nurse to inquire as to why patients Uceris was stopped on 07/13/17.  She stated she would check into it and give me a call back.  She called back stating that maybe a miscommunication; theres no reason listed why it was stopped.  She stated she would restart.  I told her not to restart just yet that I wanted to check with Gunnar FusiPaula, NP to let know what I found out first and would call her back when I had an answer.  Spoke with Gunnar FusiPaula on the phone - she is going to check with Dr. Myrtie Neitheranis and Amil AmenJulia before restarting Uceris.

## 2017-07-20 ENCOUNTER — Encounter: Payer: Self-pay | Admitting: Nurse Practitioner

## 2017-07-20 ENCOUNTER — Telehealth: Payer: Self-pay

## 2017-07-20 ENCOUNTER — Other Ambulatory Visit: Payer: Self-pay

## 2017-07-20 DIAGNOSIS — E46 Unspecified protein-calorie malnutrition: Secondary | ICD-10-CM | POA: Diagnosis not present

## 2017-07-20 DIAGNOSIS — K529 Noninfective gastroenteritis and colitis, unspecified: Secondary | ICD-10-CM | POA: Diagnosis not present

## 2017-07-20 DIAGNOSIS — F0390 Unspecified dementia without behavioral disturbance: Secondary | ICD-10-CM | POA: Diagnosis not present

## 2017-07-20 DIAGNOSIS — K279 Peptic ulcer, site unspecified, unspecified as acute or chronic, without hemorrhage or perforation: Secondary | ICD-10-CM | POA: Diagnosis not present

## 2017-07-20 DIAGNOSIS — M1990 Unspecified osteoarthritis, unspecified site: Secondary | ICD-10-CM | POA: Diagnosis not present

## 2017-07-20 DIAGNOSIS — J449 Chronic obstructive pulmonary disease, unspecified: Secondary | ICD-10-CM | POA: Diagnosis not present

## 2017-07-20 DIAGNOSIS — I10 Essential (primary) hypertension: Secondary | ICD-10-CM | POA: Diagnosis not present

## 2017-07-20 DIAGNOSIS — K219 Gastro-esophageal reflux disease without esophagitis: Secondary | ICD-10-CM | POA: Diagnosis not present

## 2017-07-20 DIAGNOSIS — D649 Anemia, unspecified: Secondary | ICD-10-CM | POA: Diagnosis not present

## 2017-07-20 MED ORDER — BUDESONIDE ER 9 MG PO TB24: 9.0000 mg | ORAL_TABLET | Freq: Every day | ORAL | 5 refills | Status: DC

## 2017-07-20 NOTE — Telephone Encounter (Signed)
Per Gunnar FusiPaula, restart Uceris 9 mg daily on this patient.  I called [facility] and spoke with Elease HashimotoPatricia, and told her to restart Uceris 9 mg daily. And I faxed over a prescription to [phone] Attn: Elease HashimotoPatricia

## 2017-07-26 DIAGNOSIS — I10 Essential (primary) hypertension: Secondary | ICD-10-CM | POA: Diagnosis not present

## 2017-07-26 DIAGNOSIS — E039 Hypothyroidism, unspecified: Secondary | ICD-10-CM | POA: Diagnosis not present

## 2017-07-26 DIAGNOSIS — K52832 Lymphocytic colitis: Secondary | ICD-10-CM | POA: Diagnosis not present

## 2017-07-26 DIAGNOSIS — R531 Weakness: Secondary | ICD-10-CM | POA: Diagnosis not present

## 2017-07-31 NOTE — Telephone Encounter (Signed)
  Done I spoke with son [name] several days ago. He says that his mother's diarrhea is improving. He also told me that he spoke with [facility] and they had NOT stopped the Uceris but no one knew [facility]. Anyway, [name] will call us with an update in a couple of weeks or get patient a follow up with me.

## 2017-08-10 ENCOUNTER — Telehealth: Payer: Self-pay | Admitting: Internal Medicine

## 2017-08-10 DIAGNOSIS — R261 Paralytic gait: Secondary | ICD-10-CM | POA: Diagnosis not present

## 2017-08-10 DIAGNOSIS — R1312 Dysphagia, oropharyngeal phase: Secondary | ICD-10-CM | POA: Diagnosis not present

## 2017-08-10 DIAGNOSIS — G8929 Other chronic pain: Secondary | ICD-10-CM | POA: Diagnosis not present

## 2017-08-10 DIAGNOSIS — R1030 Lower abdominal pain, unspecified: Secondary | ICD-10-CM | POA: Diagnosis not present

## 2017-08-10 DIAGNOSIS — D649 Anemia, unspecified: Secondary | ICD-10-CM | POA: Diagnosis not present

## 2017-08-10 DIAGNOSIS — R197 Diarrhea, unspecified: Secondary | ICD-10-CM | POA: Diagnosis not present

## 2017-08-10 DIAGNOSIS — M6281 Muscle weakness (generalized): Secondary | ICD-10-CM | POA: Diagnosis not present

## 2017-08-10 NOTE — Telephone Encounter (Signed)
Caller name: Cindee Lameete  Relation to pt: PT from [facility]  Call back number: [phone]   Reason for call:  Requesting PT  2x 1 requesting verbal orders, please advise

## 2017-08-10 NOTE — Telephone Encounter (Signed)
Copied from CRM [id number]. Topic: General - Other >> Aug 10, 2017 10:32 AM Elliot GaultBell, Tiffany M wrote: Jethro Bolusaller name: Katherine Flynn  Relation to pt: RN from [facility]  Call back number: [phone]  Pharmacy:  Reason for call:  RN wanted to inform PCP patient tested postive for depression / complained of constant dizziness / vitals signs stabled / requesting verbal orders for plan of care, home health aid services, speech therapy for swalling and OT for ADL, please advise

## 2017-08-13 ENCOUNTER — Emergency Department (HOSPITAL_COMMUNITY)
Admission: EM | Admit: 2017-08-13 | Discharge: 2017-08-13 | Disposition: A | Discharge status: Home / Self Care | Payer: Medicare HMO | Attending: Emergency Medicine | Admitting: Emergency Medicine

## 2017-08-13 ENCOUNTER — Encounter (HOSPITAL_COMMUNITY): Payer: Self-pay | Admitting: *Deleted

## 2017-08-13 ENCOUNTER — Other Ambulatory Visit: Payer: Self-pay

## 2017-08-13 DIAGNOSIS — S0181XA Laceration without foreign body of other part of head, initial encounter: Secondary | ICD-10-CM | POA: Diagnosis not present

## 2017-08-13 DIAGNOSIS — Y999 Unspecified external cause status: Secondary | ICD-10-CM | POA: Diagnosis not present

## 2017-08-13 DIAGNOSIS — R1312 Dysphagia, oropharyngeal phase: Secondary | ICD-10-CM | POA: Diagnosis not present

## 2017-08-13 DIAGNOSIS — G8929 Other chronic pain: Secondary | ICD-10-CM | POA: Diagnosis not present

## 2017-08-13 DIAGNOSIS — Y939 Activity, unspecified: Secondary | ICD-10-CM | POA: Insufficient documentation

## 2017-08-13 DIAGNOSIS — Z5321 Procedure and treatment not carried out due to patient leaving prior to being seen by health care provider: Secondary | ICD-10-CM | POA: Insufficient documentation

## 2017-08-13 DIAGNOSIS — D649 Anemia, unspecified: Secondary | ICD-10-CM | POA: Diagnosis not present

## 2017-08-13 DIAGNOSIS — R261 Paralytic gait: Secondary | ICD-10-CM | POA: Diagnosis not present

## 2017-08-13 DIAGNOSIS — Y929 Unspecified place or not applicable: Secondary | ICD-10-CM | POA: Insufficient documentation

## 2017-08-13 DIAGNOSIS — W19XXXA Unspecified fall, initial encounter: Secondary | ICD-10-CM | POA: Insufficient documentation

## 2017-08-13 DIAGNOSIS — M6281 Muscle weakness (generalized): Secondary | ICD-10-CM | POA: Diagnosis not present

## 2017-08-13 DIAGNOSIS — R197 Diarrhea, unspecified: Secondary | ICD-10-CM | POA: Diagnosis not present

## 2017-08-13 DIAGNOSIS — J449 Chronic obstructive pulmonary disease, unspecified: Secondary | ICD-10-CM | POA: Diagnosis not present

## 2017-08-13 DIAGNOSIS — I10 Essential (primary) hypertension: Secondary | ICD-10-CM | POA: Diagnosis not present

## 2017-08-13 LAB — CBC
HCT: 37.7 % (ref 36.0–46.0)
Hemoglobin: 11.8 g/dL — ABNORMAL LOW (ref 12.0–15.0)
MCH: 24.4 pg — AB (ref 26.0–34.0)
MCHC: 31.3 g/dL (ref 30.0–36.0)
MCV: 77.9 fL — ABNORMAL LOW (ref 78.0–100.0)
PLATELETS: 396 10*3/uL (ref 150–400)
RBC: 4.84 MIL/uL (ref 3.87–5.11)
RDW: 17.3 % — AB (ref 11.5–15.5)
WBC: 7.9 10*3/uL (ref 4.0–10.5)

## 2017-08-13 LAB — COMPREHENSIVE METABOLIC PANEL
ALBUMIN: 2.7 g/dL — AB (ref 3.5–5.0)
ALK PHOS: 79 U/L (ref 38–126)
ALT: 20 U/L (ref 14–54)
ANION GAP: 9 (ref 5–15)
AST: 25 U/L (ref 15–41)
BILIRUBIN TOTAL: 0.5 mg/dL (ref 0.3–1.2)
BUN: 12 mg/dL (ref 6–20)
CALCIUM: 8.9 mg/dL (ref 8.9–10.3)
CO2: 26 mmol/L (ref 22–32)
CREATININE: 0.77 mg/dL (ref 0.44–1.00)
Chloride: 102 mmol/L (ref 101–111)
GFR calc Af Amer: >60 mL/min (ref >60)
GFR calc non Af Amer: >60 mL/min (ref >60)
GLUCOSE: 102 mg/dL — AB (ref 65–99)
Potassium: 3.9 mmol/L (ref 3.5–5.1)
SODIUM: 137 mmol/L (ref 135–145)
TOTAL PROTEIN: 5.8 g/dL — AB (ref 6.5–8.1)

## 2017-08-13 NOTE — Telephone Encounter (Signed)
Angelique BlonderCalled Pete from [facility]  @ [phone]  to give verbal orders "mail box is full, unable to take any messages at this time"

## 2017-08-13 NOTE — Telephone Encounter (Signed)
Verbal orders given to [facility] Hospital[facility]ky Morton,RN

## 2017-08-13 NOTE — ED Triage Notes (Addendum)
Pt fell out of her bed and has a lac to her R forehead.  Poor historian.  Pt states she is dizzy, but no more dizzy than she normally is, but she states she feels nauseated.  Lac above R eye, no active bleeding.  Pt not on blood thinners.

## 2017-08-14 ENCOUNTER — Encounter: Payer: Self-pay | Admitting: Nurse Practitioner

## 2017-08-14 ENCOUNTER — Telehealth: Payer: Self-pay | Admitting: Internal Medicine

## 2017-08-14 ENCOUNTER — Ambulatory Visit: Payer: Medicare HMO | Admitting: Nurse Practitioner

## 2017-08-14 VITALS — BP 118/70 | HR 74 | Ht <= 58 in | Wt <= 1120 oz

## 2017-08-14 DIAGNOSIS — R1312 Dysphagia, oropharyngeal phase: Secondary | ICD-10-CM | POA: Diagnosis not present

## 2017-08-14 DIAGNOSIS — K52839 Microscopic colitis, unspecified: Secondary | ICD-10-CM | POA: Diagnosis not present

## 2017-08-14 DIAGNOSIS — I10 Essential (primary) hypertension: Secondary | ICD-10-CM | POA: Diagnosis not present

## 2017-08-14 DIAGNOSIS — G8929 Other chronic pain: Secondary | ICD-10-CM | POA: Diagnosis not present

## 2017-08-14 DIAGNOSIS — R197 Diarrhea, unspecified: Secondary | ICD-10-CM | POA: Diagnosis not present

## 2017-08-14 DIAGNOSIS — D649 Anemia, unspecified: Secondary | ICD-10-CM | POA: Diagnosis not present

## 2017-08-14 DIAGNOSIS — R42 Dizziness and giddiness: Secondary | ICD-10-CM | POA: Diagnosis not present

## 2017-08-14 DIAGNOSIS — J449 Chronic obstructive pulmonary disease, unspecified: Secondary | ICD-10-CM | POA: Diagnosis not present

## 2017-08-14 DIAGNOSIS — E860 Dehydration: Secondary | ICD-10-CM

## 2017-08-14 DIAGNOSIS — M6281 Muscle weakness (generalized): Secondary | ICD-10-CM | POA: Diagnosis not present

## 2017-08-14 DIAGNOSIS — R261 Paralytic gait: Secondary | ICD-10-CM | POA: Diagnosis not present

## 2017-08-14 NOTE — Telephone Encounter (Signed)
Copied from CRM [id number]. Topic: Quick Communication - See Telephone Encounter >> Aug 14, 2017  8:27 AM Elliot GaultBell, Tiffany M wrote: CRM for notification. See Telephone encounter for:   08/14/17.  Caller name: Tammy  Relation to pt: Speech Therapist from [facility] Call back number: [phone]    Reason for call:  Verbal order 2x 3 and 1x 1 for swallowing and cognition

## 2017-08-14 NOTE — Patient Instructions (Addendum)
If you are age 81 or older, your body mass index should be between 23-30. Your Body mass index is 15.15 kg/m. If this is out of the aforementioned range listed, please consider follow up with your Primary Care Provider.  If you are age 81 or younger, your body mass index should be between 19-25. Your Body mass index is 15.15 kg/m. If this is out of the aformentioned range listed, please consider follow up with your Primary Care Provider.   Increase Imodium to three times daily.  Continue Ensure twice daily.  Ask caregiver to call us with reports about bowels, and PO intake.  Make an appointment with Dr. Amador CunasKwiatkowski.  Refill Uceris.  Initial prescription was faxed with 5 refills.  Thank you for choosing me and [facility].   Willette ClusterPaula Guenther, NP

## 2017-08-14 NOTE — Progress Notes (Signed)
Chief Complaint:   Follow up on diarrhea  HPI: Patient is a 83 yo female known to Dr. Myrtie Neitheranis. She has a hx of Bilroth II and was hospitalized with an  upper GI bleed due to a large anastomatic ulcer in late July.  She was re-hospitalized mid October with severe crampy diarrhea and weight loss.  C. difficile was negative. Flex sig with biopsies showed lymphocytic colitis.  She was discharged to [facility] 10/19 on Budesonide 9 mg daily. [facility] called the office 10/26 with complaints of ongoing diarrhea despite budesonide and imodium. At that time we added 14 days of flagyl and advised the facility to make sure she was getting Euceris not Entocort  I saw patient for hospital follow up 11/7 and Uceris was not on facility med list. We called [facility] and learned that patient had been taken off the medication for unclear reasons on 11/2. We left a message for nursing staff to call to discuss . In the interim , a few days later I spoke over phone to patient's son Katherine Flynn who had spoken to [facility] and learned that patient HAD NOT been taken off the Euceris. At any rate, when I spoke with Katherine Saucerean he said the diarrhea was improving and they were in the process of getting patient moved back home. I advised him to get a follow-up appointment with us in a few weeks to  reevaluate diarrhea and her and decide on where to go with the Euceris. He would call in the interim should patient need to be seen sooner.   Patient returns for follow up . She is here with another son. Dean couldn't make it. The woman who stays with her during the day was going to come but her husband was ill. Patient has been having some dizziness lately. She has developed hearing problems in right ear. Dizziness lead to a fall prompting ED visit yesterday. She has a laceration over right eyebrow. Patient didn't mention diarrhea to the ED but some basic labs were drawn and potassium and renal function were normal.  WBC also normal.  EKG showed normal sinus rhythm with incomplete .    Patient says she is eating fine. She drinks plenty of fluids and son substantiates this. Her weight is 70 lbs, same as it was a month ago. Son isn't sure but estimates patient is having 3-4 loose BMs a day, having 1-2 loose stools during the night. No blood in stool. No abdominal pain. Patient says she is urinating several times a day.    Past Medical History:  Diagnosis Date  . Chronic pain syndrome   . COPD (chronic obstructive pulmonary disease) (HCC)   . GERD (gastroesophageal reflux disease)   . History of pyloric stenosis   . Hypertension   . Macular degeneration   . Osteoarthritis   . Thyroid disease   . Weight loss     Patient's surgical history, family medical history, social history, medications and allergies were all reviewed in Epic    Physical Exam: BP 118/70   Pulse 74   Ht 4\' 9"  (1.448 m)   Wt 70 lb (31.8 kg)   SpO2 94%   BMI 15.15 kg/m   GENERAL: very thin white female in NAD PSYCH: :Pleasant, cooperative, normal affect EENT:  conjunctiva pink, mucous membranes dry, neck supple without masses CARDIAC:  RRR, no peripheral edema PULM: Normal respiratory effort, lungs CTA bilaterally, no wheezing ABDOMEN:  Nondistended, soft, nontender. No obvious masses, no hepatomegaly,  normal  bowel sounds Musculoskeletal:  Normal muscle tone, normal strength NEURO: Alert and oriented x 3, no focal neurologic deficits    ASSESSMENT and PLAN:  1. Pleasant , frail 83 year old female with diarrhea / lymphocytic colitis diagnosed mid Oct. Been on Uceris 9 mg daily.  Hard to assess how much, if any improvement she has had. Her caregiver isn't with her today but patient thinks she is averaging 3 loose stools during day and a couple at night. Her weight is still low at 70 lbs but it is stable. -Staff had hard time hearing BP (in any position) but not orthostatic based on heart rate moving from lying to sitting. Mucous membranes  are dry,  I think she would benefit from IV fluids but understand son's concerns about long ED wait.  -son taking patient to breakfast, she hasn't had anything to eat / drink to day. Asked him to push fluids. -continue BID ensure. This could be contributing to loose stools but she needs the nutrition. Will Increase imodium to TID -Son will ask caregiver to call us with information regarding amounts of diarrhea. If truly having several loose stools a day then failing Budesonide and needs alternative plan of treatment . I assume this is all related to lymphocytic colitis. CT scan in late Sept did show show inflammation involving bowel but it was felt to be secondary to inflammatory changes around gastrojejunostomy. Of note, CT scan also showed stable CBD and PD dilation. Pancreatic elastase was normal.    2. New dizziness / diminished hearing in right ear. Dizziness could be volume depletion but also query ear infection. - Advised son to make patient appt with her PCP to be seen as soon as possible. She has already fallen once from the dizziness. Advised him to have low threshold for going back to ED   Katherine Flynn , NP 08/14/2017, 8:51 AM

## 2017-08-15 ENCOUNTER — Other Ambulatory Visit: Payer: Self-pay

## 2017-08-15 ENCOUNTER — Telehealth: Payer: Self-pay | Admitting: Nurse Practitioner

## 2017-08-15 DIAGNOSIS — R261 Paralytic gait: Secondary | ICD-10-CM | POA: Diagnosis not present

## 2017-08-15 DIAGNOSIS — D649 Anemia, unspecified: Secondary | ICD-10-CM | POA: Diagnosis not present

## 2017-08-15 DIAGNOSIS — J449 Chronic obstructive pulmonary disease, unspecified: Secondary | ICD-10-CM | POA: Diagnosis not present

## 2017-08-15 DIAGNOSIS — G8929 Other chronic pain: Secondary | ICD-10-CM | POA: Diagnosis not present

## 2017-08-15 DIAGNOSIS — R1312 Dysphagia, oropharyngeal phase: Secondary | ICD-10-CM | POA: Diagnosis not present

## 2017-08-15 DIAGNOSIS — I10 Essential (primary) hypertension: Secondary | ICD-10-CM | POA: Diagnosis not present

## 2017-08-15 DIAGNOSIS — M6281 Muscle weakness (generalized): Secondary | ICD-10-CM | POA: Diagnosis not present

## 2017-08-15 DIAGNOSIS — R197 Diarrhea, unspecified: Secondary | ICD-10-CM | POA: Diagnosis not present

## 2017-08-15 MED ORDER — SUCRALFATE 1 GM/10ML PO SUSP: 1.0000 g | Freq: Three times a day (TID) | ORAL | 0 refills | Status: AC

## 2017-08-15 MED ORDER — BUDESONIDE ER 9 MG PO TB24: 9.0000 mg | ORAL_TABLET | Freq: Every day | ORAL | 5 refills | Status: AC

## 2017-08-15 MED ORDER — PANTOPRAZOLE SODIUM 40 MG PO TBEC: 40.0000 mg | DELAYED_RELEASE_TABLET | Freq: Two times a day (BID) | ORAL | 1 refills | Status: AC

## 2017-08-15 NOTE — Telephone Encounter (Signed)
Prescriptions for Uceris, Protonix and Carafate sent to [facility] [place].

## 2017-08-15 NOTE — Telephone Encounter (Signed)
Katherine Flynn who states she is patient guardian wants to know if patient was suppose to get medications called in after appt yesterday 12.4.18, and if so which pharmacy were they sent to. Care taker also states pt is about to run out of acid reflux medicine.

## 2017-08-15 NOTE — Telephone Encounter (Signed)
Spoke with Tammy, Speech Therapist from [facility] @ [phone]  Verbal orders per Dr Kirtland Bouchardk were given 2x 3 and 1x 1 for swallowing and cognition.

## 2017-08-16 DIAGNOSIS — I10 Essential (primary) hypertension: Secondary | ICD-10-CM | POA: Diagnosis not present

## 2017-08-16 DIAGNOSIS — R1312 Dysphagia, oropharyngeal phase: Secondary | ICD-10-CM | POA: Diagnosis not present

## 2017-08-16 DIAGNOSIS — R197 Diarrhea, unspecified: Secondary | ICD-10-CM | POA: Diagnosis not present

## 2017-08-16 DIAGNOSIS — M6281 Muscle weakness (generalized): Secondary | ICD-10-CM | POA: Diagnosis not present

## 2017-08-16 DIAGNOSIS — D649 Anemia, unspecified: Secondary | ICD-10-CM | POA: Diagnosis not present

## 2017-08-16 DIAGNOSIS — J449 Chronic obstructive pulmonary disease, unspecified: Secondary | ICD-10-CM | POA: Diagnosis not present

## 2017-08-16 DIAGNOSIS — R261 Paralytic gait: Secondary | ICD-10-CM | POA: Diagnosis not present

## 2017-08-16 DIAGNOSIS — G8929 Other chronic pain: Secondary | ICD-10-CM | POA: Diagnosis not present

## 2017-08-17 DIAGNOSIS — R1312 Dysphagia, oropharyngeal phase: Secondary | ICD-10-CM | POA: Diagnosis not present

## 2017-08-17 DIAGNOSIS — R197 Diarrhea, unspecified: Secondary | ICD-10-CM | POA: Diagnosis not present

## 2017-08-17 DIAGNOSIS — G8929 Other chronic pain: Secondary | ICD-10-CM | POA: Diagnosis not present

## 2017-08-17 DIAGNOSIS — R261 Paralytic gait: Secondary | ICD-10-CM | POA: Diagnosis not present

## 2017-08-17 DIAGNOSIS — I10 Essential (primary) hypertension: Secondary | ICD-10-CM | POA: Diagnosis not present

## 2017-08-17 DIAGNOSIS — D649 Anemia, unspecified: Secondary | ICD-10-CM | POA: Diagnosis not present

## 2017-08-17 DIAGNOSIS — M6281 Muscle weakness (generalized): Secondary | ICD-10-CM | POA: Diagnosis not present

## 2017-08-17 DIAGNOSIS — J449 Chronic obstructive pulmonary disease, unspecified: Secondary | ICD-10-CM | POA: Diagnosis not present

## 2017-08-20 ENCOUNTER — Emergency Department (HOSPITAL_COMMUNITY): Payer: Medicare HMO

## 2017-08-20 ENCOUNTER — Other Ambulatory Visit: Payer: Self-pay

## 2017-08-20 ENCOUNTER — Encounter (HOSPITAL_COMMUNITY): Payer: Self-pay | Admitting: Physician Assistant

## 2017-08-20 ENCOUNTER — Inpatient Hospital Stay (HOSPITAL_COMMUNITY): Payer: Medicare HMO

## 2017-08-20 ENCOUNTER — Inpatient Hospital Stay (HOSPITAL_COMMUNITY)
Admission: EM | Admit: 2017-08-20 | Discharge: 2017-09-11 | DRG: 871 | Disposition: E | Payer: Medicare HMO | Attending: Internal Medicine | Admitting: Internal Medicine

## 2017-08-20 DIAGNOSIS — K210 Gastro-esophageal reflux disease with esophagitis: Secondary | ICD-10-CM | POA: Diagnosis not present

## 2017-08-20 DIAGNOSIS — E039 Hypothyroidism, unspecified: Secondary | ICD-10-CM

## 2017-08-20 DIAGNOSIS — K529 Noninfective gastroenteritis and colitis, unspecified: Secondary | ICD-10-CM | POA: Diagnosis present

## 2017-08-20 DIAGNOSIS — E872 Acidosis, unspecified: Secondary | ICD-10-CM

## 2017-08-20 DIAGNOSIS — L89112 Pressure ulcer of right upper back, stage 2: Secondary | ICD-10-CM | POA: Diagnosis present

## 2017-08-20 DIAGNOSIS — Z8711 Personal history of peptic ulcer disease: Secondary | ICD-10-CM | POA: Diagnosis not present

## 2017-08-20 DIAGNOSIS — Z681 Body mass index (BMI) 19 or less, adult: Secondary | ICD-10-CM | POA: Diagnosis not present

## 2017-08-20 DIAGNOSIS — J9600 Acute respiratory failure, unspecified whether with hypoxia or hypercapnia: Secondary | ICD-10-CM | POA: Diagnosis present

## 2017-08-20 DIAGNOSIS — A419 Sepsis, unspecified organism: Secondary | ICD-10-CM | POA: Diagnosis not present

## 2017-08-20 DIAGNOSIS — I959 Hypotension, unspecified: Secondary | ICD-10-CM | POA: Diagnosis present

## 2017-08-20 DIAGNOSIS — S40012A Contusion of left shoulder, initial encounter: Secondary | ICD-10-CM | POA: Diagnosis present

## 2017-08-20 DIAGNOSIS — R197 Diarrhea, unspecified: Secondary | ICD-10-CM | POA: Diagnosis not present

## 2017-08-20 DIAGNOSIS — K921 Melena: Secondary | ICD-10-CM | POA: Diagnosis not present

## 2017-08-20 DIAGNOSIS — Y92009 Unspecified place in unspecified non-institutional (private) residence as the place of occurrence of the external cause: Secondary | ICD-10-CM | POA: Diagnosis not present

## 2017-08-20 DIAGNOSIS — R4182 Altered mental status, unspecified: Secondary | ICD-10-CM | POA: Diagnosis not present

## 2017-08-20 DIAGNOSIS — Z66 Do not resuscitate: Secondary | ICD-10-CM | POA: Diagnosis not present

## 2017-08-20 DIAGNOSIS — K922 Gastrointestinal hemorrhage, unspecified: Secondary | ICD-10-CM | POA: Diagnosis not present

## 2017-08-20 DIAGNOSIS — G9340 Encephalopathy, unspecified: Secondary | ICD-10-CM | POA: Diagnosis not present

## 2017-08-20 DIAGNOSIS — W19XXXA Unspecified fall, initial encounter: Secondary | ICD-10-CM | POA: Diagnosis present

## 2017-08-20 DIAGNOSIS — L89022 Pressure ulcer of left elbow, stage 2: Secondary | ICD-10-CM | POA: Diagnosis present

## 2017-08-20 DIAGNOSIS — S0011XA Contusion of right eyelid and periocular area, initial encounter: Secondary | ICD-10-CM | POA: Diagnosis present

## 2017-08-20 DIAGNOSIS — S7002XA Contusion of left hip, initial encounter: Secondary | ICD-10-CM | POA: Diagnosis present

## 2017-08-20 DIAGNOSIS — R40243 Glasgow coma scale score 3-8, unspecified time: Secondary | ICD-10-CM | POA: Diagnosis not present

## 2017-08-20 DIAGNOSIS — N289 Disorder of kidney and ureter, unspecified: Secondary | ICD-10-CM | POA: Diagnosis present

## 2017-08-20 DIAGNOSIS — Z515 Encounter for palliative care: Secondary | ICD-10-CM | POA: Diagnosis not present

## 2017-08-20 DIAGNOSIS — Z79890 Hormone replacement therapy (postmenopausal): Secondary | ICD-10-CM

## 2017-08-20 DIAGNOSIS — K219 Gastro-esophageal reflux disease without esophagitis: Secondary | ICD-10-CM | POA: Diagnosis present

## 2017-08-20 DIAGNOSIS — R031 Nonspecific low blood-pressure reading: Secondary | ICD-10-CM | POA: Diagnosis not present

## 2017-08-20 DIAGNOSIS — L89152 Pressure ulcer of sacral region, stage 2: Secondary | ICD-10-CM | POA: Diagnosis present

## 2017-08-20 DIAGNOSIS — R195 Other fecal abnormalities: Secondary | ICD-10-CM | POA: Diagnosis present

## 2017-08-20 DIAGNOSIS — R402432 Glasgow coma scale score 3-8, at arrival to emergency department: Secondary | ICD-10-CM

## 2017-08-20 DIAGNOSIS — R404 Transient alteration of awareness: Secondary | ICD-10-CM | POA: Diagnosis not present

## 2017-08-20 DIAGNOSIS — L8990 Pressure ulcer of unspecified site, unspecified stage: Secondary | ICD-10-CM

## 2017-08-20 DIAGNOSIS — E43 Unspecified severe protein-calorie malnutrition: Secondary | ICD-10-CM | POA: Diagnosis present

## 2017-08-20 DIAGNOSIS — L89622 Pressure ulcer of left heel, stage 2: Secondary | ICD-10-CM | POA: Diagnosis present

## 2017-08-20 DIAGNOSIS — Z79899 Other long term (current) drug therapy: Secondary | ICD-10-CM

## 2017-08-20 DIAGNOSIS — L89212 Pressure ulcer of right hip, stage 2: Secondary | ICD-10-CM | POA: Diagnosis present

## 2017-08-20 DIAGNOSIS — R296 Repeated falls: Secondary | ICD-10-CM | POA: Diagnosis present

## 2017-08-20 DIAGNOSIS — L89612 Pressure ulcer of right heel, stage 2: Secondary | ICD-10-CM | POA: Diagnosis present

## 2017-08-20 DIAGNOSIS — L89012 Pressure ulcer of right elbow, stage 2: Secondary | ICD-10-CM | POA: Diagnosis present

## 2017-08-20 DIAGNOSIS — E46 Unspecified protein-calorie malnutrition: Secondary | ICD-10-CM

## 2017-08-20 DIAGNOSIS — I10 Essential (primary) hypertension: Secondary | ICD-10-CM | POA: Diagnosis present

## 2017-08-20 DIAGNOSIS — L89222 Pressure ulcer of left hip, stage 2: Secondary | ICD-10-CM | POA: Diagnosis present

## 2017-08-20 DIAGNOSIS — K52832 Lymphocytic colitis: Secondary | ICD-10-CM | POA: Diagnosis present

## 2017-08-20 DIAGNOSIS — L89122 Pressure ulcer of left upper back, stage 2: Secondary | ICD-10-CM | POA: Diagnosis present

## 2017-08-20 HISTORY — DX: Anemia, unspecified: D64.9

## 2017-08-20 LAB — BPAM FFP
BLOOD PRODUCT EXPIRATION DATE: 201812232359
BLOOD PRODUCT EXPIRATION DATE: 201812232359
ISSUE DATE / TIME: 201812101522
ISSUE DATE / TIME: 201812101522
UNIT TYPE AND RH: 6200
Unit Type and Rh: 6200

## 2017-08-20 LAB — COMPREHENSIVE METABOLIC PANEL
ALK PHOS: 87 U/L (ref 38–126)
ALT: 27 U/L (ref 14–54)
AST: 70 U/L — AB (ref 15–41)
Albumin: 2.4 g/dL — ABNORMAL LOW (ref 3.5–5.0)
Anion gap: 29 — ABNORMAL HIGH (ref 5–15)
BILIRUBIN TOTAL: 0.9 mg/dL (ref 0.3–1.2)
BUN: 34 mg/dL — AB (ref 6–20)
CO2: 16 mmol/L — ABNORMAL LOW (ref 22–32)
CREATININE: 1.89 mg/dL — AB (ref 0.44–1.00)
Calcium: 8.6 mg/dL — ABNORMAL LOW (ref 8.9–10.3)
Chloride: 96 mmol/L — ABNORMAL LOW (ref 101–111)
GFR calc Af Amer: 25 mL/min — ABNORMAL LOW (ref >60)
GFR calc non Af Amer: 22 mL/min — ABNORMAL LOW (ref >60)
Glucose, Bld: 157 mg/dL — ABNORMAL HIGH (ref 65–99)
Potassium: 4.4 mmol/L (ref 3.5–5.1)
Sodium: 141 mmol/L (ref 135–145)
TOTAL PROTEIN: 5.3 g/dL — AB (ref 6.5–8.1)

## 2017-08-20 LAB — PREPARE FRESH FROZEN PLASMA
UNIT DIVISION: 0
Unit division: 0

## 2017-08-20 LAB — CBC WITH DIFFERENTIAL/PLATELET
BASOS ABS: 0 10*3/uL (ref 0.0–0.1)
BASOS PCT: 0 %
EOS PCT: 0 %
Eosinophils Absolute: 0 10*3/uL (ref 0.0–0.7)
HCT: 37.9 % (ref 36.0–46.0)
Hemoglobin: 12 g/dL (ref 12.0–15.0)
Lymphocytes Relative: 9 %
Lymphs Abs: 1 10*3/uL (ref 0.7–4.0)
MCH: 24.8 pg — ABNORMAL LOW (ref 26.0–34.0)
MCHC: 31.7 g/dL (ref 30.0–36.0)
MCV: 78.3 fL (ref 78.0–100.0)
MONO ABS: 0.6 10*3/uL (ref 0.1–1.0)
MONOS PCT: 6 %
Neutro Abs: 9.7 10*3/uL — ABNORMAL HIGH (ref 1.7–7.7)
Neutrophils Relative %: 85 %
PLATELETS: 254 10*3/uL (ref 150–400)
RBC: 4.84 MIL/uL (ref 3.87–5.11)
RDW: 17.4 % — AB (ref 11.5–15.5)
WBC: 11.3 10*3/uL — ABNORMAL HIGH (ref 4.0–10.5)

## 2017-08-20 LAB — CBC
HEMATOCRIT: 52.8 % — AB (ref 36.0–46.0)
HEMOGLOBIN: 18.6 g/dL — AB (ref 12.0–15.0)
MCH: 27.2 pg (ref 26.0–34.0)
MCHC: 35.2 g/dL (ref 30.0–36.0)
MCV: 77.2 fL — ABNORMAL LOW (ref 78.0–100.0)
Platelets: 223 10*3/uL (ref 150–400)
RBC: 6.84 MIL/uL — ABNORMAL HIGH (ref 3.87–5.11)
RDW: 15.5 % (ref 11.5–15.5)
WBC: 4.8 10*3/uL (ref 4.0–10.5)

## 2017-08-20 LAB — POC OCCULT BLOOD, ED: Fecal Occult Bld: POSITIVE — AB

## 2017-08-20 LAB — PREPARE RBC (CROSSMATCH)

## 2017-08-20 LAB — I-STAT CG4 LACTIC ACID, ED: LACTIC ACID, VENOUS: 13.39 mmol/L — AB (ref 0.5–1.9)

## 2017-08-20 LAB — PROCALCITONIN: Procalcitonin: 2.32 ng/mL

## 2017-08-20 LAB — LIPASE, BLOOD: LIPASE: 26 U/L (ref 11–51)

## 2017-08-20 LAB — LACTIC ACID, PLASMA: LACTIC ACID, VENOUS: 6.2 mmol/L — AB (ref 0.5–1.9)

## 2017-08-20 MED ORDER — HYDROCODONE-ACETAMINOPHEN 5-325 MG PO TABS: 1.0000 | ORAL_TABLET | ORAL | Status: DC | PRN

## 2017-08-20 MED ORDER — SUCRALFATE 1 GM/10ML PO SUSP: 1.0000 g | ORAL | Status: DC

## 2017-08-20 MED ORDER — ALBUTEROL SULFATE (2.5 MG/3ML) 0.083% IN NEBU: 2.5000 mg | INHALATION_SOLUTION | Freq: Four times a day (QID) | RESPIRATORY_TRACT | Status: DC | PRN

## 2017-08-20 MED ORDER — ACETAMINOPHEN 650 MG RE SUPP: 650.0000 mg | Freq: Four times a day (QID) | RECTAL | Status: DC | PRN

## 2017-08-20 MED ORDER — COLLAGENASE 250 UNIT/GM EX OINT: 1.0000 "application " | TOPICAL_OINTMENT | Freq: Every day | CUTANEOUS | Status: DC | PRN

## 2017-08-20 MED ORDER — SODIUM CHLORIDE 0.9 % IV SOLN
10.0000 mL/h | Freq: Once | INTRAVENOUS | Status: AC
  Administered 2017-08-20: 10 mL/h via INTRAVENOUS

## 2017-08-20 MED ORDER — ONDANSETRON HCL 4 MG/2ML IJ SOLN
4.0000 mg | Freq: Four times a day (QID) | INTRAMUSCULAR | Status: DC | PRN
Start: 2017-08-20 — End: 2017-08-21

## 2017-08-20 MED ORDER — ENSURE ENLIVE PO LIQD: 237.0000 mL | Freq: Two times a day (BID) | ORAL | Status: DC

## 2017-08-20 MED ORDER — BISACODYL 10 MG RE SUPP: 10.0000 mg | Freq: Every day | RECTAL | Status: DC | PRN

## 2017-08-20 MED ORDER — ADULT MULTIVITAMIN W/MINERALS CH: 1.0000 | ORAL_TABLET | Freq: Every day | ORAL | Status: DC

## 2017-08-20 MED ORDER — MORPHINE SULFATE (PF) 2 MG/ML IV SOLN
0.5000 mg | INTRAVENOUS | Status: DC | PRN
  Administered 2017-08-21: 0.5 mg via INTRAVENOUS
  Filled 2017-08-20: qty 1

## 2017-08-20 MED ORDER — SODIUM CHLORIDE 0.9 % IV BOLUS (SEPSIS)
500.0000 mL | Freq: Once | INTRAVENOUS | Status: AC
  Administered 2017-08-20: 500 mL via INTRAVENOUS

## 2017-08-20 MED ORDER — HYDROCORTISONE 2.5 % RE CREA: 1.0000 "application " | TOPICAL_CREAM | Freq: Two times a day (BID) | RECTAL | Status: DC | PRN

## 2017-08-20 MED ORDER — VANCOMYCIN HCL 500 MG IV SOLR: 500.0000 mg | INTRAVENOUS | Status: DC

## 2017-08-20 MED ORDER — PIPERACILLIN-TAZOBACTAM IN DEX 2-0.25 GM/50ML IV SOLN
2.2500 g | Freq: Three times a day (TID) | INTRAVENOUS | Status: DC
  Administered 2017-08-20 – 2017-08-21 (×2): 2.25 g via INTRAVENOUS
  Filled 2017-08-20 (×4): qty 50

## 2017-08-20 MED ORDER — VANCOMYCIN HCL IN DEXTROSE 750-5 MG/150ML-% IV SOLN
750.0000 mg | Freq: Once | INTRAVENOUS | Status: AC
  Administered 2017-08-20: 750 mg via INTRAVENOUS
  Filled 2017-08-20: qty 150

## 2017-08-20 MED ORDER — SODIUM CHLORIDE 0.9 % IV SOLN
INTRAVENOUS | Status: DC
  Administered 2017-08-20: 23:00:00 via INTRAVENOUS

## 2017-08-20 MED ORDER — METOPROLOL TARTRATE 25 MG PO TABS: 25.0000 mg | ORAL_TABLET | Freq: Two times a day (BID) | ORAL | Status: DC

## 2017-08-20 MED ORDER — ZINC OXIDE 11.3 % EX CREA: 1.0000 "application " | TOPICAL_CREAM | Freq: Every day | CUTANEOUS | Status: DC | PRN

## 2017-08-20 MED ORDER — ACETAMINOPHEN 325 MG PO TABS
650.0000 mg | ORAL_TABLET | Freq: Four times a day (QID) | ORAL | Status: DC | PRN
Start: 2017-08-20 — End: 2017-08-21

## 2017-08-20 MED ORDER — LEVOTHYROXINE SODIUM 100 MCG PO TABS: 100.0000 ug | ORAL_TABLET | Freq: Every day | ORAL | Status: DC

## 2017-08-20 MED ORDER — PIPERACILLIN-TAZOBACTAM 3.375 G IVPB 30 MIN
3.3750 g | Freq: Once | INTRAVENOUS | Status: AC
  Administered 2017-08-20: 3.375 g via INTRAVENOUS
  Filled 2017-08-20: qty 50

## 2017-08-20 MED ORDER — PANTOPRAZOLE SODIUM 40 MG IV SOLR
40.0000 mg | INTRAVENOUS | Status: DC
  Administered 2017-08-20: 40 mg via INTRAVENOUS
  Filled 2017-08-20: qty 40

## 2017-08-20 MED ORDER — VANCOMYCIN HCL IN DEXTROSE 1-5 GM/200ML-% IV SOLN
1000.0000 mg | Freq: Once | INTRAVENOUS | Status: DC
  Filled 2017-08-20: qty 200

## 2017-08-20 MED ORDER — SODIUM CHLORIDE 0.9 % IV BOLUS (SEPSIS)
1000.0000 mL | Freq: Once | INTRAVENOUS | Status: AC
  Administered 2017-08-20: 1000 mL via INTRAVENOUS

## 2017-08-20 MED ORDER — SENNOSIDES-DOCUSATE SODIUM 8.6-50 MG PO TABS: 1.0000 | ORAL_TABLET | Freq: Every evening | ORAL | Status: DC | PRN

## 2017-08-20 MED ORDER — POTASSIUM CHLORIDE CRYS ER 20 MEQ PO TBCR: 20.0000 meq | EXTENDED_RELEASE_TABLET | Freq: Two times a day (BID) | ORAL | Status: DC

## 2017-08-20 MED ORDER — AMLODIPINE BESYLATE 5 MG PO TABS: 5.0000 mg | ORAL_TABLET | Freq: Every day | ORAL | Status: DC

## 2017-08-20 MED ORDER — SUCRALFATE 1 GM/10ML PO SUSP: 1.0000 g | Freq: Four times a day (QID) | ORAL | Status: DC

## 2017-08-20 MED ORDER — ONDANSETRON HCL 4 MG PO TABS: 4.0000 mg | ORAL_TABLET | Freq: Four times a day (QID) | ORAL | Status: DC | PRN

## 2017-08-20 NOTE — ED Triage Notes (Signed)
[facility]- pt coming from home. Pt was unresponsive X20 minutes. She had a fall this morning and was not evaluated. Small lac above the right eye. Pt was initially unresponsive. Skin noted to be mottled. She has IO and had 300 cc of fluid.

## 2017-08-20 NOTE — Progress Notes (Signed)
Pharmacy Antibiotic Note  [name] A Hearn is a 83 y.o. female admitted on Dec 25, 2016 with sepsis.  Pharmacy has been consulted for Zosyn and vancomycin dosing.  Given 1 time doses in the ED. Afebrile, WBC 11.3. LA significantly elevated at 13.4. SCr elevated at 1.89, CrCl < 10 ml/min.  Plan: Start Zosyn 2.25g IV Q8hr Start vancomycin 500mg  IV Q48hr Monitor clinical picture, renal function, VT prn F/U C&S, abx deescalation / LOT    No data recorded.  Recent Labs  Lab 03/25/17 1437 03/25/17 1450  WBC 11.3*  --   LATICACIDVEN  --  13.39*    CrCl cannot be calculated (No order found.).    No Known Allergies   Thank you for allowing pharmacy to be a part of this patient's care.  Enzo BiNathan Ellieana Dolecki, PharmD, BCPS Clinical Pharmacist Pager [phone] Dec 25, 2016 3:53 PM

## 2017-08-20 NOTE — ED Notes (Signed)
Pt's son, [name] [facility](POA) signed blood consent form, witnessed by this RN.

## 2017-08-20 NOTE — ED Notes (Signed)
Only able to collect blood cultures...QNS for other labs.  Notified nurse.

## 2017-08-20 NOTE — H&P (Signed)
History and Physical    Katherine Flynn ZOX:[id number] DOB: May 21, 1923 DOA: Sep 03, 2017   PCP: Gordy Savers, MD   Patient coming from:  Home    Chief Complaint: Unresponsiveness   HPI: Katherine Flynn is a 83 y.o. female with medical history significant for hypothyroidism, hypertension, history of GI bleed, with endoscopy approximately 5 weeks ago with diagnosis of lymphocytic colitis, in the setting of a large anastomotic ulcer in late July.  She also has a history of severe diarrhea requiring hospitalization in October.  In all his hospitalizations, her C. difficile was negative.  Prior to admission, her diarrhea had improved, especially with the use of Imodium and fiber, with no further treatment recommended since October.  Since October, the patient has been increasingly weak, frequently falling, including a ER evaluation in December 3 for frequent falls, especially with bruising on the left shoulder, and left hip.  She also has been having increasing number of dark stools.  Today, patient was seen to have decreased level of consciousness, with worsening weakness, and she was having efforts to breathe, for which she was brought to the ER.  No apparent fever or chills Of note, the patient is DNR/DNI, and does not wish to have mechanical ventilation.  She is coming for the management of her symptoms.  Per family report, the patient had poor appetite,No  apparent chest pain or palpitations were noted.  No leg swelling.  No apparent headaches or vision changes.  No seizures were noted.  On presentation, the patient was given 2 L  IV fluids and IV antibiotics and was noted to have elevated white count of 11.3, and lactic acid very elevated at 13.39 described below   ED Course:  BP 102/79 (BP Location: Right Arm)   Pulse (!) 101   Temp (!) 90.4 F (32.4 C) (Rectal)   Resp (!) 22   Wt 29.9 kg (66 lb)   LMP  (LMP Unknown)   SpO2 96%   White count 11.3, CO2 16, calcium 8.6  creatinine 1.89, BUN 34, lactic acid 13.39 Zosyn and vancomycin IV given Received IV fluids 500 cc bottle Temperature 96.1 on presentation, placed on bear hugger  CT of the head is negative Urinalysis pending  Chest x-ray NAD  Review of Systems: Unable to obtain further details due to patient current status  History reviewed. No pertinent past medical history.   History reviewed. No pertinent surgical history.  Social History Social History   Socioeconomic History  . Marital status: Widowed    Spouse name: Not on file  . Number of children: Not on file  . Years of education: Not on file  . Highest education level: Not on file  Social Needs  . Financial resource strain: Not on file  . Food insecurity - worry: Not on file  . Food insecurity - inability: Not on file  . Transportation needs - medical: Not on file  . Transportation needs - non-medical: Not on file  Occupational History  . Not on file  Tobacco Use  . Smoking status: Not on file  Substance and Sexual Activity  . Alcohol use: Not on file  . Drug use: Not on file  . Sexual activity: Not on file  Other Topics Concern  . Not on file  Social History Narrative  . Not on file      No Known Allergies  History reviewed. No pertinent family history.    Prior to Admission medications   Medication Sig Start  Date End Date Taking? Authorizing Provider  acetaminophen (TYLENOL) 500 MG tablet Take 500 mg by mouth every 6 (six) hours as needed for headache (pain).   Yes [provider]  albuterol (PROVENTIL HFA;VENTOLIN HFA) 108 (90 Base) MCG/ACT inhaler Inhale 2 puffs into the lungs every 6 (six) hours as needed for wheezing.    Yes [provider]  amLODipine (NORVASC) 5 MG tablet Take 5 mg by mouth daily.   Yes [provider]  collagenase (SANTYL) ointment Apply 1 application topically daily as needed (wound care).    Yes [provider]  ENSURE (ENSURE) Take 237 mLs by mouth 2  (two) times daily.   Yes [provider]  hydrocortisone (ANUSOL-HC) 2.5 % rectal cream Place 1 application rectally 2 (two) times daily as needed for hemorrhoids or anal itching.   Yes [provider]  levothyroxine (SYNTHROID, LEVOTHROID) 100 MCG tablet Take 100 mcg by mouth daily before breakfast.   Yes [provider]  loperamide (IMODIUM) 2 MG capsule Take by mouth 3 (three) times daily.    Yes [provider]  metoprolol tartrate (LOPRESSOR) 25 MG tablet Take 25 mg by mouth 2 (two) times daily.   Yes [provider]  Multiple Vitamin (MULTIVITAMIN WITH MINERALS) TABS tablet Take 1 tablet by mouth daily.   Yes [provider]  pantoprazole (PROTONIX) 40 MG tablet Take 40 mg by mouth 2 (two) times daily. 08/16/17  Yes [provider]  potassium chloride SA (K-DUR,KLOR-CON) 20 MEQ tablet Take 20 mEq by mouth 2 (two) times daily.   Yes [provider]  Psyllium (METAMUCIL PO) Take 1 Scoop by mouth daily. Mix in 8 oz liquid and drink   Yes [provider]  sucralfate (CARAFATE) 1 GM/10ML suspension Take 1 g by mouth every 4 (four) hours.   Yes [provider]  zinc oxide (BALMEX) 11.3 % CREA cream Apply 1 application topically daily as needed (wound care).    Yes [provider]    Physical Exam:  Vitals:   09/08/2017 1432 08/17/2017 1530 08/12/2017 1530 09/08/2017 1556  BP:   (!) 75/59 102/79  Pulse:   93 (!) 101  Resp:   20 (!) 22  Temp:   (!) 96.1 F (35.6 C) (!) 90.4 F (32.4 C)  TempSrc:   Temporal Rectal  SpO2: 99%  93% 96%  Weight:  29.9 kg (66 lb)     Constitutional: NAD ill appearing and cachectic  Eyes: PERRL, R lids with bruising  and conjunctivae normal ENMT: Mucous membranes are moist, without exudate or lesions  Neck: normal, supple, no masses, no thyromegaly Respiratory: clear to auscultation bilaterally, no wheezing, no crackles. Normal respiratory effort  Cardiovascular tachy   rate and rhythm,  murmur, rubs or gallops. No extremity edema. 2+ pedal pulses. No carotid bruits.  Abdomen: Soft, non tender, No hepatosplenomegaly. Bowel sounds positive.  Musculoskeletal: no clubbing / cyanosis. Moves all extremities. Bruising noted on the left hip and left shoulder  Skin: no jaundice, No lesions.  Neurologic: Sensation intact  Strength unable to test due to generalized weakness       Labs on Admission: I have personally reviewed following labs and imaging studies  CBC: Recent Labs  Lab 08/17/2017 1437  WBC 11.3*  NEUTROABS 9.7*  HGB 12.0  HCT 37.9  MCV 78.3  PLT 254    Basic Metabolic Panel: Recent Labs  Lab 09/02/2017 1437  NA 141  K 4.4  CL 96*  CO2  16*  GLUCOSE 157*  BUN 34*  CREATININE 1.89*  CALCIUM 8.6*    GFR: CrCl cannot be calculated (Unknown ideal weight.).  Liver Function Tests: Recent Labs  Lab 2016/11/23 1437  AST 70*  ALT 27  ALKPHOS 87  BILITOT 0.9  PROT 5.3*  ALBUMIN 2.4*   Recent Labs  Lab 2016/11/23 1437  LIPASE 26   No results for input(s): AMMONIA in the last 168 hours.  Coagulation Profile: No results for input(s): INR, PROTIME in the last 168 hours.  Cardiac Enzymes: No results for input(s): CKTOTAL, CKMB, CKMBINDEX, TROPONINI in the last 168 hours.  BNP (last 3 results) No results for input(s): PROBNP in the last 8760 hours.  HbA1C: No results for input(s): HGBA1C in the last 72 hours.  CBG: No results for input(s): GLUCAP in the last 168 hours.  Lipid Profile: No results for input(s): CHOL, HDL, LDLCALC, TRIG, CHOLHDL, LDLDIRECT in the last 72 hours.  Thyroid Function Tests: No results for input(s): TSH, T4TOTAL, FREET4, T3FREE, THYROIDAB in the last 72 hours.  Anemia Panel: No results for input(s): VITAMINB12, FOLATE, FERRITIN, TIBC, IRON, RETICCTPCT in the last 72 hours.  Urine analysis: No results found for: COLORURINE, APPEARANCEUR, LABSPEC, PHURINE, GLUCOSEU, HGBUR, BILIRUBINUR, KETONESUR,  PROTEINUR, UROBILINOGEN, NITRITE, LEUKOCYTESUR  Sepsis Labs: @LABRCNTIP (procalcitonin:4,lacticidven:4) )No results found for this or any previous visit (from the past 240 hour(s)).   Radiological Exams on Admission: Dg Chest Portable 1 View  Result Date: 2016-11-28 CLINICAL DATA:  Unresponsive.  Decreased blood pressure. EXAM: PORTABLE CHEST 1 VIEW COMPARISON:  None FINDINGS: The heart size appears within normal limits. There is no pleural effusion identified. Aortic atherosclerosis. Increased lung volumes. No focal lung opacities identified. IMPRESSION: No active disease. Aortic Atherosclerosis (ICD10-I70.0). Electronically Signed   By: Signa Kellaylor  Stroud M.D.   On: 2016-11-28 14:57    EKG: Independently reviewed.  Assessment/Plan Active Problems:   Sepsis (HCC)   GIB (gastrointestinal bleeding)   Hypothyroidism   Hypertension   GERD (gastroesophageal reflux disease)   Malnutrition (HCC)    Sepsis organism unknown, in the setting of recent hospitalization.  Patient meets criteria given tachycardia, tachypnea, hypothermia Tmax 90 F  , leukocytosis, and evidence of organ dysfunction with severe lactic acid at 13.39 .  Antibiotics delivered in the ED with Vanc and Zosyn . UA pending, SHe received 2 L IVF as well  Admit to SDU Sepsis order set  IV antibiotics by pharmacy with Vanc and Zosyn  Follow lactic acid q 3 hrs Follow blood cultures and UA  IV fluids at 100 cc/h.  Procalcitonin order set  Continue Bear hugger  Follow C diff results    Lower GI bleed could be due to her recent history of same, seen by GI, last upper endoscopy 5 weeks ago  with diagnosis of lymphocytic colitis, in the setting of a large anastomotic ulcer in late July. Hemocult is positive . Receiving blood transfusion  Cycle CBC every 6 hours Type and screen complete  NPO, advance to Clear liquids only Normal saline IV fluid  Protonix 40mg  IV BID pending GI eval Hold all NSAIDs   GI consult  Continue Santyl      Hypothyroidism: Continue home Synthroid   Hypertension BP 102/79   Hold home anti-hypertensive medications  Add Hydralazine Q6 hours as needed for BP 160/90    Malnutrition Obtain dietitian consult    DVT prophylaxis:  SCD  Code Status:    DNR/DNI  Family Communication:  Discussed with patient and family  Disposition Plan: Expect patient to be discharged to home after condition improves. May need to discuss goals of care more in detail as her overall status is declining  Consults called:    GI as per EDP  Admission status:  SDU IP    Marlowe Kays, PA-C [facility]   Sep 05, 2017, 5:05 PM

## 2017-08-20 NOTE — Progress Notes (Signed)
RN paged because pt was agonal breathing, BP in the 70s, HR 120s, and respirations 38 with agonal breathing. Apparently, according to chart and RN, the family is aware of how sick the pt is. Pt is a DNR with no escalation of care to bipap or pressors. NP to bedside. S: can not participate in ROS secondary to mental status. Per RN, earlier in shift, responded a little and took a couple drinks of water.  O: Very frail, chronically ill appearing elderly WF in NAD. Mouth open. No response even to sternal rub. Resp are agonal. Tachycardic. Rectal temp 10F. SBP 70s. Skin LEs in mottled. Skin temp is cool. A/P: 1. Sepsis-I believe pt is actively dying given her BP, respirations and mottling of LEs. NP called pt's son and explained that pt would probably not live through the night. He was understanding and gave permission to use MSO4. NP d/c'd all po meds due to unresponsiveness. Should son decide to come to hospital, NP recommends talking to him about only comfort care. 2. Code status-confirmed as DNR with son.  KJKG, NP [facility]

## 2017-08-20 NOTE — Progress Notes (Signed)
Thank you for sending this case to me. I have reviewed the entire note, and the outlined plan seems appropriate.  It has been very difficult to perform diagnostic tests on this frail, elderly woman.  Microscopic colitis is the only Dx we have been able to find.   Only two other thoughts I have are pancreatic insufficiency and SIBO b/c she has had a gastrojejunostomy.  Fecal elastase may be normal, but perhaps panc enzymes not mixing well with food due to altered anatomy.   A trial of pancreatic enzymes (if she can swallow the capsules)  of at least 60K units lipase/meal is in order.  If that does not help, then a trial of flagyl and( If not PCN allergic) cefalexin for 10 days would be the last thing to try.  Imodium in low doses is fine - taking care not to cause obstipation.   I have tried to stress realistic goals with Skarlet's family, given her age and poor condition.Marland Kitchen.  Amada JupiterHenry Danis, MD

## 2017-08-20 NOTE — ED Notes (Signed)
While rolling pt over to change pt, pt had medium size bloody stool. Arline Aspindy, RN was assisting this tech when cleaning pt. Placed a purewick on pt, tolerated well.

## 2017-08-20 NOTE — ED Notes (Signed)
Spoke with admitting MD concerning pt's recent bloody stool. MD said to repeat CBC and verify GI was consulted.

## 2017-08-20 NOTE — ED Notes (Signed)
Attempted second IV stick X2. Unable to obtain. Will use the single IV for now.

## 2017-08-20 NOTE — ED Provider Notes (Signed)
Emergency Department Provider Note   I have reviewed the triage vital signs and the nursing notes.   HISTORY  Chief Complaint Altered Mental Status   HPI Katherine Flynn is a 83 y.o. female presents to the emergency department unresponsive by EMS.  The patient was last seen normal 30 minutes prior to EMS call.  She apparently had a fall at home today but was not seen by any health provider.  EMS found her to be hypotensive and minimally responsive.  She was having agonal breathing.  They started an IO line and gave 300 ml IVF en route.   Level 5 caveat: Unresponsive    History reviewed. No pertinent past medical history.  Patient Active Problem List   Diagnosis Date Noted  . Sepsis (HCC) 08/26/2017    History reviewed. No pertinent surgical history.    Allergies Patient has no known allergies.  History reviewed. No pertinent family history.  Social History Social History   Tobacco Use  . Smoking status: Not on file  Substance Use Topics  . Alcohol use: Not on file  . Drug use: Not on file    Review of Systems  Level 5 caveat: Unresponsive.   ____________________________________________   PHYSICAL EXAM:  VITAL SIGNS: Vitals:   09/05/2017 1530 09/09/2017 1556  BP: (!) 75/59 102/79  Pulse: 93 (!) 101  Resp: 20 (!) 22  Temp: (!) 96.1 F (35.6 C) (!) 90.4 F (32.4 C)  SpO2: 93% 96%    Constitutional: Somnolent with infrequent respirations. Not responding to internal stimuli.  Eyes: Conjunctivae are normal. Pupils are 2 mm and sluggish.  Head: Atraumatic. Nose: No congestion/rhinnorhea. Mouth/Throat: Mucous membranes are very dry.  Neck: No stridor.  Cardiovascular: Tachycardia. Good peripheral circulation. Grossly normal heart sounds.   Respiratory: Diminished respiratory effort.  No retractions. Lungs CTAB. Gastrointestinal: Soft and nontender. No distention. Dark drown/liquid stool.  Musculoskeletal: No lower extremity tenderness nor edema.  No gross deformities of extremities. Neurologic:  Normal speech and language. No gross focal neurologic deficits are appreciated.  Skin:  Skin is warm, dry and intact. No rash noted.  ____________________________________________   LABS (all labs ordered are listed, but only abnormal results are displayed)  Labs Reviewed  COMPREHENSIVE METABOLIC PANEL - Abnormal; Notable for the following components:      Result Value   Chloride 96 (*)    CO2 16 (*)    Glucose, Bld 157 (*)    BUN 34 (*)    Creatinine, Ser 1.89 (*)    Calcium 8.6 (*)    Total Protein 5.3 (*)    Albumin 2.4 (*)    AST 70 (*)    GFR calc non Af Amer 22 (*)    GFR calc Af Amer 25 (*)    Anion gap 29 (*)    All other components within normal limits  CBC WITH DIFFERENTIAL/PLATELET - Abnormal; Notable for the following components:   WBC 11.3 (*)    MCH 24.8 (*)    RDW 17.4 (*)    Neutro Abs 9.7 (*)    All other components within normal limits  I-STAT CG4 LACTIC ACID, ED - Abnormal; Notable for the following components:   Lactic Acid, Venous 13.39 (*)    All other components within normal limits  POC OCCULT BLOOD, ED - Abnormal; Notable for the following components:   Fecal Occult Bld POSITIVE (*)    All other components within normal limits  URINE CULTURE  C DIFFICILE QUICK SCREEN  W PCR REFLEX  CULTURE, BLOOD (ROUTINE X 2)  CULTURE, BLOOD (ROUTINE X 2)  LIPASE, BLOOD  URINALYSIS, ROUTINE W REFLEX MICROSCOPIC  I-STAT CG4 LACTIC ACID, ED  TYPE AND SCREEN  PREPARE FRESH FROZEN PLASMA  PREPARE RBC (CROSSMATCH)   ____________________________________________  EKG   EKG Interpretation  Date/Time:  Monday August 20 2017 14:55:10 EST Ventricular Rate:  102 PR Interval:    QRS Duration: 119 QT Interval:  365 QTC Calculation: 476 R Axis:   71 Text Interpretation:  Sinus tachycardia Biatrial enlargement Incomplete right bundle branch block Repol abnrm suggests ischemia, diffuse leads No STEMI.  Confirmed  by Alona Bene [phone]) on 09/05/2017 4:30:47 PM       ____________________________________________  RADIOLOGY  Dg Chest Portable 1 View  Result Date: 08/16/2017 CLINICAL DATA:  Unresponsive.  Decreased blood pressure. EXAM: PORTABLE CHEST 1 VIEW COMPARISON:  None FINDINGS: The heart size appears within normal limits. There is no pleural effusion identified. Aortic atherosclerosis. Increased lung volumes. No focal lung opacities identified. IMPRESSION: No active disease. Aortic Atherosclerosis (ICD10-I70.0). Electronically Signed   By: Signa Kell M.D.   On: 08/31/2017 14:57    ____________________________________________   PROCEDURES  Procedure(s) performed:   Procedures  CRITICAL CARE Performed by: Maia Plan Total critical care time: 60 minutes Critical care time was exclusive of separately billable procedures and treating other patients. Critical care was necessary to treat or prevent imminent or life-threatening deterioration. Critical care was time spent personally by me on the following activities: development of treatment plan with patient and/or surrogate as well as nursing, discussions with consultants, evaluation of patient's response to treatment, examination of patient, obtaining history from patient or surrogate, ordering and performing treatments and interventions, ordering and review of laboratory studies, ordering and review of radiographic studies, pulse oximetry and re-evaluation of patient's condition.  Alona Bene, MD Emergency Medicine  ____________________________________________   INITIAL IMPRESSION / ASSESSMENT AND PLAN / ED COURSE  Pertinent labs & imaging results that were available during my care of the patient were reviewed by me and considered in my medical decision making (see chart for details).  Patient arrives to the emergency department unresponsive.  EMS initially told by a neighbor that the patient is "full code."  She is having  some spontaneous respirations but she is unresponsive to painful stimuli.  Plan for Narcan although patient not known to be on strong pain medications.  Blood sugar with EMS is normal.  Given the patient's age and frail appearance I plan to discuss her goals of care with family by phone if possible. Considering SEPSIS vs intra-abdominal emergency vs head trauma and bleed.   02:30 PM I spoke with the patient's son by phone who is listed as her emergency contact.  He tells me that the patient has expressed her end-of-life wishes with him and she is DNR/DNI.  He reports several recent falls including one today.  I had a discussion with him describing the seriousness of the patient's condition and that she is making some effort to breathe on her own but right now is requiring assist with ventilation.  He understands and will continue with her wishes.  Plan for labs, portable chest x-ray and CT head in case there is a large bleed and we would be able to better treat her symptoms and give some diagnosis/prognosis.   03:22 PM Family now at bedside.  The report continued diarrhea at home with recent GI bleeding and recent endoscopy approximately 5 weeks ago.  They agree with supportive care including IV fluids, blood transfusion as needed, and antibiotics.  They do not want central line placement, pressors, intubation, or ACLS.  On reevaluation the patient is more awake and alert.  Patient has dark brown liquid stool on rectal exam.  Abdomen is soft, nondistended.  Just the very serious nature of the patient's presentation. Delay in some of the Sepsis measures driven by patient's desire to be DNR and mostly comfortable rather than aggressively treating hypotension.  Discussed patient's case with Hospitalist to request admission. Patient and family (if present) updated with plan. Care transferred to Hospitalist service.  I reviewed all nursing notes, vitals, pertinent old records, EKGs, labs, imaging (as  available).  04:50 PM BP significantly improved with transfusion. Continuing supportive care. GI paged to request inpatient consultation but no indication for emergent endoscopy at this time.  ____________________________________________  FINAL CLINICAL IMPRESSION(S) / ED DIAGNOSES  Final diagnoses:  Glasgow coma scale total score 3-8, at arrival to emergency department [facility](HCC)  Lactic acidosis  Diarrhea, unspecified type  Gastrointestinal hemorrhage, unspecified gastrointestinal hemorrhage type     MEDICATIONS GIVEN DURING THIS VISIT:  Medications  sodium chloride 0.9 % bolus 1,000 mL (1,000 mLs Intravenous New Bag/Given 06/13/17 1515)    And  sodium chloride 0.9 % bolus 500 mL (not administered)  0.9 %  sodium chloride infusion (not administered)  piperacillin-tazobactam (ZOSYN) IVPB 2.25 g (not administered)  vancomycin (VANCOCIN) 500 mg in sodium chloride 0.9 % 100 mL IVPB (not administered)  vancomycin (VANCOCIN) IVPB 750 mg/150 ml premix (not administered)  collagenase (SANTYL) ointment 1 application (not administered)  ENSURE liquid 237 mL (not administered)  hydrocortisone (ANUSOL-HC) 2.5 % rectal cream 1 application (not administered)  levothyroxine (SYNTHROID, LEVOTHROID) tablet 100 mcg (not administered)  metoprolol tartrate (LOPRESSOR) tablet 25 mg (not administered)  amLODipine (NORVASC) tablet 5 mg (not administered)  multivitamin with minerals tablet 1 tablet (not administered)  potassium chloride SA (K-DUR,KLOR-CON) CR tablet 20 mEq (not administered)  zinc oxide (BALMEX) 11.3 % cream 1 application (not administered)  0.9 %  sodium chloride infusion (not administered)  acetaminophen (TYLENOL) tablet 650 mg (not administered)    Or  acetaminophen (TYLENOL) suppository 650 mg (not administered)  senna-docusate (Senokot-S) tablet 1 tablet (not administered)  bisacodyl (DULCOLAX) suppository 10 mg (not administered)  ondansetron (ZOFRAN) tablet 4 mg (not  administered)    Or  ondansetron (ZOFRAN) injection 4 mg (not administered)  HYDROcodone-acetaminophen (NORCO/VICODIN) 5-325 MG per tablet 1-2 tablet (not administered)  sucralfate (CARAFATE) 1 GM/10ML suspension 1 g (not administered)  sodium chloride 0.9 % bolus 500 mL (500 mLs Intravenous New Bag/Given 06/13/17 1454)  piperacillin-tazobactam (ZOSYN) IVPB 3.375 g (0 g Intravenous Stopped 06/13/17 1549)    Note:  This document was prepared using Dragon voice recognition software and may include unintentional dictation errors.  Alona BeneJoshua Long, MD Emergency Medicine    Long, Arlyss RepressJoshua G, MD 06/13/17 [phone]

## 2017-08-20 NOTE — ED Notes (Signed)
Attempted report x1. 

## 2017-08-20 NOTE — Progress Notes (Addendum)
Katherine Flynn with [facility] paged the following at 2325.  6E12:Katherine Flynn.Pt. agonal breathing and mottled BLE. HR: 120s. 4LNC. Resp:38. WJ:19/14(78BP:78/44(49). Lungs w/ fine crackles. DNR, no pressers or central line   2332:Katherine Poag. [name] to come to bedside and call son.   2345: Katherine Flynn at bedside. Patient assessed. Son Katherine Flynn called by Craige CottaKirby. Son is okay with all oral medication being stopped and IV morphine being added to ease patients pain and breathing. Son understands that patient may not make it through the evening.   0000: Patient now unresponsive even to sternal rub.   0100: patient placed on non-rebreather. Saturations in low 90's with poor pleth/reading. Hands placed under heat pack, still with poor reading.   0115: [name].

## 2017-08-20 NOTE — ED Notes (Signed)
EMS IO line removed.

## 2017-08-20 NOTE — ED Notes (Signed)
I Stat Lactic Acid results shown to Dr. Shela CommonsJ. Long

## 2017-08-20 NOTE — ED Notes (Signed)
Patient transported to CT 

## 2017-08-21 ENCOUNTER — Other Ambulatory Visit: Payer: Self-pay

## 2017-08-21 DIAGNOSIS — L8990 Pressure ulcer of unspecified site, unspecified stage: Secondary | ICD-10-CM

## 2017-08-21 DIAGNOSIS — K210 Gastro-esophageal reflux disease with esophagitis: Secondary | ICD-10-CM

## 2017-08-21 LAB — TYPE AND SCREEN
ABO/RH(D): O NEG
ANTIBODY SCREEN: NEGATIVE
UNIT DIVISION: 0
Unit division: 0
Unit division: 0
Unit division: 0

## 2017-08-21 LAB — BPAM RBC
BLOOD PRODUCT EXPIRATION DATE: 201812182359
BLOOD PRODUCT EXPIRATION DATE: 201812252359
Blood Product Expiration Date: 201812182359
Blood Product Expiration Date: 201812252359
ISSUE DATE / TIME: 201812101520
ISSUE DATE / TIME: 201812101520
UNIT TYPE AND RH: 9500
UNIT TYPE AND RH: 9500
Unit Type and Rh: 9500
Unit Type and Rh: 9500

## 2017-08-21 LAB — BLOOD PRODUCT ORDER (VERBAL) VERIFICATION

## 2017-08-21 LAB — MRSA PCR SCREENING: MRSA BY PCR: POSITIVE — AB

## 2017-08-21 LAB — ABO/RH: ABO/RH(D): O NEG

## 2017-08-21 MED ORDER — MORPHINE SULFATE (PF) 2 MG/ML IV SOLN: 0.5000 mg | INTRAVENOUS | Status: DC | PRN

## 2017-08-21 MED ORDER — GLYCOPYRROLATE 0.2 MG/ML IJ SOLN: 0.1000 mg | INTRAMUSCULAR | Status: DC | PRN

## 2017-08-22 ENCOUNTER — Encounter: Payer: Self-pay | Admitting: Nurse Practitioner

## 2017-08-22 NOTE — Progress Notes (Signed)
Patient passed away yesterday. Fell again at home. Taken to ED unresponsive / met sepsis criteria but etiology wasn't determined.  / also had large bloody BM in short time she was there.  

## 2017-08-24 ENCOUNTER — Ambulatory Visit: Payer: Medicare HMO | Admitting: Gastroenterology

## 2017-08-25 LAB — CULTURE, BLOOD (ROUTINE X 2)
CULTURE: NO GROWTH
Culture: NO GROWTH
SPECIAL REQUESTS: ADEQUATE
Special Requests: ADEQUATE

## 2017-09-11 NOTE — Death Summary Note (Addendum)
DEATH SUMMARY   Patient Details  Name: Katherine Flynn MRN: [id number] DOB: 10-30-1922  Admission/Discharge Information   Admit Date:  09-14-2017  Date of Death: Date of Death: 09/15/17  Time of Death: Time of Death: 0957  Length of Stay: 1  Referring Physician: Marletta Lor, MD   Reason(s) for Hospitalization  Acute encephalopathy, respiratory difficulty/acute respiratory failure  Diagnoses  Preliminary cause of death: Acute respiratory failure ([place])   Secondary Diagnoses (including complications and co-morbidities):     Sepsis ([place])   Recurrent GIB (gastrointestinal bleeding)   Hypothyroidism   Hypertension   GERD (gastroesophageal reflux disease) Moderate to severe malnutrition ([place])   Pressure injury of skin   Brief Hospital Course (including significant findings, care, treatment, and services provided and events leading to death)  Katherine Flynn is a 84 y.o. year old female with hypothyroidism, hypertension, history of GI bleed, endoscopy 5 weeks ago with the diagnosis of lymphocytic colitis in the setting of large anastomotic ulcer in late July.  Also has history of severe chronic diarrhea requiring hospitalization in October.  Since 10/18, patient has been increasingly weak frequently falling.  She also has been having increasing number of dark stools.  On the day of admission patient was found to be having a decreased level of consciousness, worsening weakness and respiratory difficulty.  Patient was admitted for further workup.  Sepsis with acute respiratory failure -Patient met sepsis criteria on the admission with tachycardia, tachypnea, hypothermia, leukocytosis, lactic acidosis, elevated pro-calcitonin -Patient was started on IV fluids, broad-spectrum IV antibiotics with vancomycin and Zosyn -Chest x-ray was negative for pneumonia -Discussed in detail with the patient's sons at the bedside in detail, overall poor prognosis, having agonal  breathing.  Patient's son requested complete comfort care status, DNR/DNI, no escalation of care.  -Discontinued IV antibiotics, IV fluids, telemetry.  - Placed on comfort care orders, IV morphine as needed, robinul as needed, palliative care was consulted, hospital death was expected  Recurrent GI bleed, recent endoscopy 5 weeks ago with diagnosis of lymphocytic colitis in the setting of large anastomotic ulcer in 03/2017 -Hemoccult positive, patient received packed RBC transfusion -Discussed with patient sons at the bedside, they do not want any further endoscopies or escalation of care.  PPI discontinued, placed on complete comfort care orders.  Patient passed on 09-15-17   Pertinent Labs and Studies  Significant Diagnostic Studies Ct Head Wo Contrast  Result Date: 2017/09/14 CLINICAL DATA:  Altered mental status. EXAM: CT HEAD WITHOUT CONTRAST TECHNIQUE: Contiguous axial images were obtained from the base of the skull through the vertex without intravenous contrast. COMPARISON:  No comparison studies available. FINDINGS: Brain: There is no evidence for acute hemorrhage, hydrocephalus, mass lesion, or abnormal extra-axial fluid collection. No definite CT evidence for acute infarction. Diffuse loss of parenchymal volume is consistent with atrophy. Diffuse ventriculomegaly evident. Patchy low attenuation in the deep hemispheric and periventricular white matter is nonspecific, but likely reflects chronic microvascular ischemic demyelination. Vascular: No hyperdense vessel or unexpected calcification. Skull: Age indeterminate fracture of the nasal bones evident. No evidence for skull fracture. Specifically, no evidence for temporal bone fracture No worrisome lytic or sclerotic lesion. Sinuses/Orbits: Air-fluid levels are seen in the sphenoid sinuses bilaterally. No fluid in the mastoid air cells or middle ear on either side. Mid axillary and right frontal sinus are clear. Left frontal sinus  hypoplastic. Visualized portions of the globes and intraorbital fat are unremarkable. Other: None. IMPRESSION: 1. Diffuse atrophy with associated diffuse ventriculomegaly. Ventriculomegaly  likely related to central atrophy although hydrocephalus cannot be excluded. 2. Diffuse chronic small vessel white matter ischemic disease. 3. Air-fluid levels in the sphenoid sinuses may be related to hemorrhage at the nasal bone fractures are acute. In the absence of acute trauma, acute sinusitis could have this appearance. 4. No evidence for temporal bone fracture or subarachnoid hemorrhage. Electronically Signed   By: Misty Stanley M.D.   On: 08/28/2017 17:53   Dg Chest Portable 1 View  Result Date: 08/19/2017 CLINICAL DATA:  Unresponsive.  Decreased blood pressure. EXAM: PORTABLE CHEST 1 VIEW COMPARISON:  None FINDINGS: The heart size appears within normal limits. There is no pleural effusion identified. Aortic atherosclerosis. Increased lung volumes. No focal lung opacities identified. IMPRESSION: No active disease. Aortic Atherosclerosis (ICD10-I70.0). Electronically Signed   By: Kerby Moors M.D.   On: 08/25/2017 14:57    Microbiology Recent Results (from the past 240 hour(s))  Culture, blood (routine x 2)     Status: None (Preliminary result)   Collection Time: 08/22/2017  7:30 PM  Result Value Ref Range Status   Specimen Description BLOOD RIGHT HAND  Final   Special Requests IN PEDIATRIC BOTTLE Blood Culture adequate volume  Final   Culture NO GROWTH < 24 HOURS  Final   Report Status PENDING  Incomplete  Culture, blood (routine x 2)     Status: None (Preliminary result)   Collection Time: 09/09/2017  7:58 PM  Result Value Ref Range Status   Specimen Description BLOOD LEFT HAND  Final   Special Requests IN PEDIATRIC BOTTLE Blood Culture adequate volume  Final   Culture NO GROWTH < 24 HOURS  Final   Report Status PENDING  Incomplete  MRSA PCR Screening     Status: Abnormal   Collection Time: 08/29/2017   9:00 PM  Result Value Ref Range Status   MRSA by PCR POSITIVE (A) NEGATIVE Final    Comment:        The GeneXpert MRSA Assay (FDA approved for NASAL specimens only), is one component of a comprehensive MRSA colonization surveillance program. It is not intended to diagnose MRSA infection nor to guide or monitor treatment for MRSA infections. RESULT CALLED TO, READ BACK BY AND VERIFIED WITH: B SIMPSON RN [phone] 2017-09-17 A BROWNING     Lab Basic Metabolic Panel: Recent Labs  Lab 08/14/2017 1437  NA 141  K 4.4  CL 96*  CO2 16*  GLUCOSE 157*  BUN 34*  CREATININE 1.89*  CALCIUM 8.6*   Liver Function Tests: Recent Labs  Lab 08/23/2017 1437  AST 70*  ALT 27  ALKPHOS 87  BILITOT 0.9  PROT 5.3*  ALBUMIN 2.4*   Recent Labs  Lab 09/07/2017 1437  LIPASE 26   No results for input(s): AMMONIA in the last 168 hours. CBC: Recent Labs  Lab 09/02/2017 1437 08/29/2017 2150  WBC 11.3* 4.8  NEUTROABS 9.7*  --   HGB 12.0 18.6*  HCT 37.9 52.8*  MCV 78.3 77.2*  PLT 254 223   Cardiac Enzymes: No results for input(s): CKTOTAL, CKMB, CKMBINDEX, TROPONINI in the last 168 hours. Sepsis Labs: Recent Labs  Lab 08/12/2017 1437 08/28/2017 1450 09/08/2017 2150  PROCALCITON  --   --  2.32  WBC 11.3*  --  4.8  LATICACIDVEN  --  13.39* 6.2*    Procedures/Operations  None    Ripudeep Rai 2017-09-17, 1:21 PM    Coding query:   Presurre injury of the skin Location : Boney prominence pressure ulcers. Bilateral heels,  hips, sacrum, elbows and shoulders. Right, left; bilateral Staging   Stage II - Partial thickness loss of dermis presenting as a shallow open ulcer with a red, pink wound bed without slough.   Acute renal insufficiency Presented with creatinine of 1.8, baseline creatinine 0.6-0.7, no prior history of chronic kidney disease    Ripudeep Rai M.D. [facility] 09/01/2017, 2:23 PM  Pager: [phone]

## 2017-09-11 NOTE — Progress Notes (Signed)
Per Dr. Isidoro Donningai order pt is comfort care only.

## 2017-09-11 NOTE — Progress Notes (Signed)
[facility] [address], [place] [address] Phone Number: [phone]

## 2017-09-11 NOTE — Progress Notes (Signed)
[facility]                                                                              Patient Demographics  Katherine Flynn, is a 84 y.o. female, DOB - [birth year], HUO:[id number]  Admit date - 08/22/2017   Admitting Physician Courage Denton Brick, MD  Outpatient Primary MD for the patient is Marletta Lor, MD  Outpatient specialists:   LOS - 1  days   Medical records reviewed and are as summarized below:    Chief Complaint  Patient presents with  . Altered Mental Status       Brief summary   Patient is a 84 year old female with hypothyroidism, hypertension, history of GI bleed, endoscopy 5 weeks ago with the diagnosis of lymphocytic colitis in the setting of large anastomotic ulcer in late July.  Also has history of severe chronic diarrhea requiring hospitalization in October.  Since 10/18, patient has been increasingly weak frequently falling.  She also has been having increasing number of dark stools.  On the day of admission patient was found to be having a decreased level of consciousness, worsening weakness and respiratory difficulty.  Patient was admitted for further workup.   Assessment & Plan    Active Problems:   Sepsis ([place])   GIB (gastrointestinal bleeding)   Hypothyroidism   Hypertension   GERD (gastroesophageal reflux disease)   Malnutrition ([facility])   Pressure injury of skin  Sepsis -Unclear etiology, met sepsis criteria on the admission with tachycardia, tachypnea, hypothermia, leukocytosis, lactic acidosis, elevated pro-calcitonin -Patient was started on IV fluids, broad-spectrum IV antibiotics with vancomycin and Zosyn -Chest x-ray negative for pneumonia -Discussed in detail with the patient's sons at the bedside in detail, overall poor prognosis, having agonal breathing.  Patient's son requested complete comfort care status, DNR/DNI, no escalation of care.  -Discontinued IV antibiotics, IV fluids, telemetry.  - Placed on comfort  care orders, IV morphine as needed, robinul as needed, palliative care consulted, likely hospital death  Recurrent GI bleed, recent endoscopy 5 weeks ago with diagnosis of lymphocytic colitis in the setting of large anastomotic ulcer in 03/2017 -Hemoccult positive, patient received packed RBC transfusion -Discussed with patient sons at the bedside, they do not want any further endoscopies or escalation of care.  PPI discontinued, placed on complete comfort care orders.  Code Status: DNR, DNI, comfor care  DVT Prophylaxis:   Family Communication: Discussed in detail with the patient, all imaging results, lab results explained to the patient's two sons at the bedside   Disposition Plan: Likely hospital death, poor prognosis of less than 24-48 hours  Time Spent in minutes   45 minutes  Procedures:  None  Consultants:   GI was consulted Palliative  Antimicrobials:   IV vancomycin 12/10- 12/11  IV Zosyn 12/10-12/11   Medications  Scheduled Meds: Continuous Infusions: PRN Meds:.albuterol, glycopyrrolate, morphine injection, [DISCONTINUED] ondansetron **OR** ondansetron (ZOFRAN) IV   Antibiotics   Anti-infectives (From admission, onward)   Start     Dose/Rate Route Frequency Ordered Stop   08/22/17 2000  vancomycin (VANCOCIN) 500 mg in sodium chloride 0.9 % 100 mL IVPB  Status:  Discontinued  500 mg 100 mL/hr over 60 Minutes Intravenous Every 48 hours 09/03/2017 1604 09-Sep-2017 0800   08/23/2017 2200  piperacillin-tazobactam (ZOSYN) IVPB 2.25 g  Status:  Discontinued     2.25 g 100 mL/hr over 30 Minutes Intravenous Every 8 hours 08/18/2017 1604 2017/09/09 0800   09/01/2017 1630  vancomycin (VANCOCIN) IVPB 750 mg/150 ml premix     750 mg 150 mL/hr over 60 Minutes Intravenous  Once 09/10/2017 1605 09/09/2017 2014   08/24/2017 1500  piperacillin-tazobactam (ZOSYN) IVPB 3.375 g     3.375 g 100 mL/hr over 30 Minutes Intravenous  Once 08/17/2017 1455 08/18/2017 1549   08/13/2017 1500  vancomycin  (VANCOCIN) IVPB 1000 mg/200 mL premix  Status:  Discontinued     1,000 mg 200 mL/hr over 60 Minutes Intravenous  Once 09/09/2017 1455 08/19/2017 1605        Subjective:   Katherine Flynn was seen and examined today.  BP low, tachycardia, on examination agonal breathing, unresponsive, family at the bedside.  Objective:   Vitals:   09/09/2017 0256 2017/09/09 0341 09/09/2017 0437 Sep 09, 2017 0612  BP: (!) 67/49 (!) 59/37 (!) 63/43 (!) 72/46  Pulse:      Resp: (!) 27 (!) 26 (!) 28 (!) 26  Temp: 97.6 F (36.4 C) 98 F (36.7 C) 97.7 F (36.5 C) 97.9 F (36.6 C)  TempSrc:    Axillary  SpO2: 90% 90% 91% 90%  Weight:      Height:        Intake/Output Summary (Last 24 hours) at 09-09-17 6440 Last data filed at 09-Sep-2017 0400 Gross per 24 hour  Intake 3408 ml  Output 0 ml  Net 3408 ml     Wt Readings from Last 3 Encounters:  09-09-17 30.8 kg (68 lb)     Exam  General: Unresponsive, agonal breathing  Eyes:   HEENT:    Cardiovascular: S1 S2 clear, RRR  Respiratory: Scattered rhonchi bilaterally  Gastrointestinal: Soft, ND, NBS  Ext: no pedal edema bilaterally  Neuro: unresponsive  Musculoskeletal: No digital cyanosis, clubbing  Skin: No rashes  Psych: unresponsive   Data Reviewed:  I have personally reviewed following labs and imaging studies  Micro Results Recent Results (from the past 240 hour(s))  MRSA PCR Screening     Status: Abnormal   Collection Time: 09/10/2017  9:00 PM  Result Value Ref Range Status   MRSA by PCR POSITIVE (A) NEGATIVE Final    Comment:        The GeneXpert MRSA Assay (FDA approved for NASAL specimens only), is one component of a comprehensive MRSA colonization surveillance program. It is not intended to diagnose MRSA infection nor to guide or monitor treatment for MRSA infections. RESULT CALLED TO, READ BACK BY AND VERIFIED WITHBrendia Sacks RN [phone] 2017/09/09 A BROWNING     Radiology Reports Ct Head Wo Contrast  Result  Date: 08/25/2017 CLINICAL DATA:  Altered mental status. EXAM: CT HEAD WITHOUT CONTRAST TECHNIQUE: Contiguous axial images were obtained from the base of the skull through the vertex without intravenous contrast. COMPARISON:  No comparison studies available. FINDINGS: Brain: There is no evidence for acute hemorrhage, hydrocephalus, mass lesion, or abnormal extra-axial fluid collection. No definite CT evidence for acute infarction. Diffuse loss of parenchymal volume is consistent with atrophy. Diffuse ventriculomegaly evident. Patchy low attenuation in the deep hemispheric and periventricular white matter is nonspecific, but likely reflects chronic microvascular ischemic demyelination. Vascular: No hyperdense vessel or unexpected calcification. Skull: Age indeterminate fracture of the nasal bones  evident. No evidence for skull fracture. Specifically, no evidence for temporal bone fracture No worrisome lytic or sclerotic lesion. Sinuses/Orbits: Air-fluid levels are seen in the sphenoid sinuses bilaterally. No fluid in the mastoid air cells or middle ear on either side. Mid axillary and right frontal sinus are clear. Left frontal sinus hypoplastic. Visualized portions of the globes and intraorbital fat are unremarkable. Other: None. IMPRESSION: 1. Diffuse atrophy with associated diffuse ventriculomegaly. Ventriculomegaly likely related to central atrophy although hydrocephalus cannot be excluded. 2. Diffuse chronic small vessel white matter ischemic disease. 3. Air-fluid levels in the sphenoid sinuses may be related to hemorrhage at the nasal bone fractures are acute. In the absence of acute trauma, acute sinusitis could have this appearance. 4. No evidence for temporal bone fracture or subarachnoid hemorrhage. Electronically Signed   By: Misty Stanley M.D.   On: 08/31/2017 17:53   Dg Chest Portable 1 View  Result Date: 09/09/2017 CLINICAL DATA:  Unresponsive.  Decreased blood pressure. EXAM: PORTABLE CHEST 1 VIEW  COMPARISON:  None FINDINGS: The heart size appears within normal limits. There is no pleural effusion identified. Aortic atherosclerosis. Increased lung volumes. No focal lung opacities identified. IMPRESSION: No active disease. Aortic Atherosclerosis (ICD10-I70.0). Electronically Signed   By: Kerby Moors M.D.   On: 08/17/2017 14:57    Lab Data:  CBC: Recent Labs  Lab 09/08/2017 1437 08/17/2017 2150  WBC 11.3* 4.8  NEUTROABS 9.7*  --   HGB 12.0 18.6*  HCT 37.9 52.8*  MCV 78.3 77.2*  PLT 254 542   Basic Metabolic Panel: Recent Labs  Lab 08/23/2017 1437  NA 141  K 4.4  CL 96*  CO2 16*  GLUCOSE 157*  BUN 34*  CREATININE 1.89*  CALCIUM 8.6*   GFR: Estimated Creatinine Clearance: 9.2 mL/min (A) (by C-G formula based on SCr of 1.89 mg/dL (H)). Liver Function Tests: Recent Labs  Lab 08/23/2017 1437  AST 70*  ALT 27  ALKPHOS 87  BILITOT 0.9  PROT 5.3*  ALBUMIN 2.4*   Recent Labs  Lab 08/13/2017 1437  LIPASE 26   No results for input(s): AMMONIA in the last 168 hours. Coagulation Profile: No results for input(s): INR, PROTIME in the last 168 hours. Cardiac Enzymes: No results for input(s): CKTOTAL, CKMB, CKMBINDEX, TROPONINI in the last 168 hours. BNP (last 3 results) No results for input(s): PROBNP in the last 8760 hours. HbA1C: No results for input(s): HGBA1C in the last 72 hours. CBG: No results for input(s): GLUCAP in the last 168 hours. Lipid Profile: No results for input(s): CHOL, HDL, LDLCALC, TRIG, CHOLHDL, LDLDIRECT in the last 72 hours. Thyroid Function Tests: No results for input(s): TSH, T4TOTAL, FREET4, T3FREE, THYROIDAB in the last 72 hours. Anemia Panel: No results for input(s): VITAMINB12, FOLATE, FERRITIN, TIBC, IRON, RETICCTPCT in the last 72 hours. Urine analysis: No results found for: COLORURINE, APPEARANCEUR, LABSPEC, PHURINE, GLUCOSEU, HGBUR, BILIRUBINUR, KETONESUR, PROTEINUR, UROBILINOGEN, NITRITE, LEUKOCYTESUR   Zalen Sequeira M.D. [facility] Aug 27, 2017, 9:37 AM  Pager: [phone] Between 7am to 7pm - call Pager - [phone]  After 7pm go to [email or web address] - password [email or web address]  Call night coverage person covering after 7pm

## 2017-09-11 NOTE — Progress Notes (Signed)
Called into room per family. Pt not breathing Chest auscultated with no sounds. Judeth CornfieldStephanie RN charge nurse 2nd nurse verification. Hiram Combererek Oliver Heitzenrater RN

## 2017-09-11 NOTE — Plan of Care (Signed)
Patient see and examined, 2 sons at the bed side.  BP (!) 72/46  Pulse (!) 127   Temp 97.9 F    Resp (!) 26 , Agonal breathing on NRB mask  Had a long discussion with both sons about patient's status, overall poor prognosis. Family requested DNR, DNI, complete comfort care goals at this time. They stated "its time and she had a good life". We talked about the patient, originally from [place] who met their father during WWII, has 9 children, many grandchildren and great grand children.   - DC tele, IVF, all medications expect meds for comfort care - May need morphine drip if resp status deteriorates  Full note to follow     M.D. [facility] 08/13/2017, 8:06 AM  Pager: [phone]        

## 2017-09-11 DEATH — deceased

## 2018-05-15 IMAGING — CT CT HEAD W/O CM
4 series · 15 of 47 positions shown, 17 images · non-contrast
Comparison: No comparison studies available.

CLINICAL DATA: Altered mental status.

EXAM:
CT HEAD WITHOUT CONTRAST
TECHNIQUE: Contiguous axial images were obtained from the base of the skull
through the vertex without intravenous contrast.

[Series 3: head without · axial · non-contrast · 0.39mm/px · z∈[-89,+26]mm · 7 of 31 slices shown, 9 images]
[im 4/31  brain]
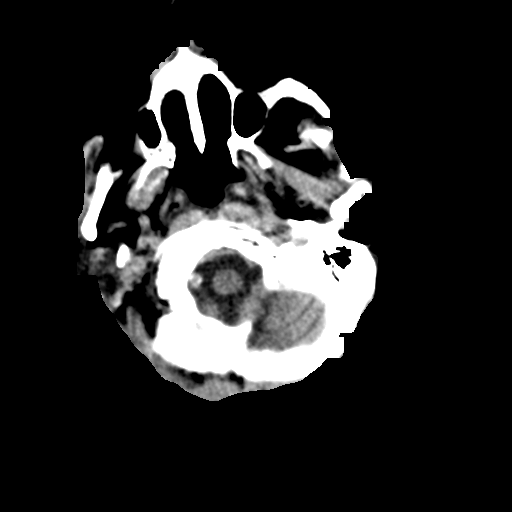
[im 4/31  bone]
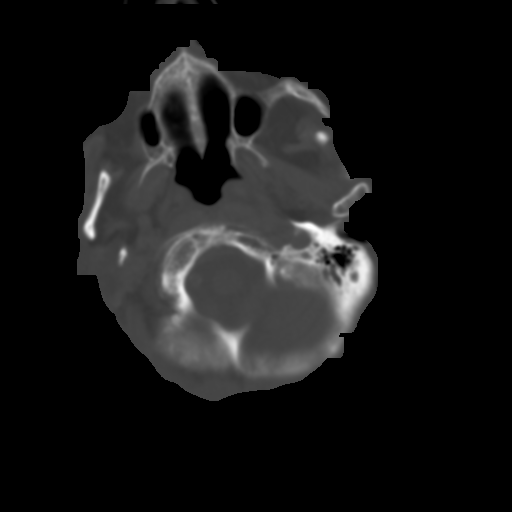
[im 8/31  brain]
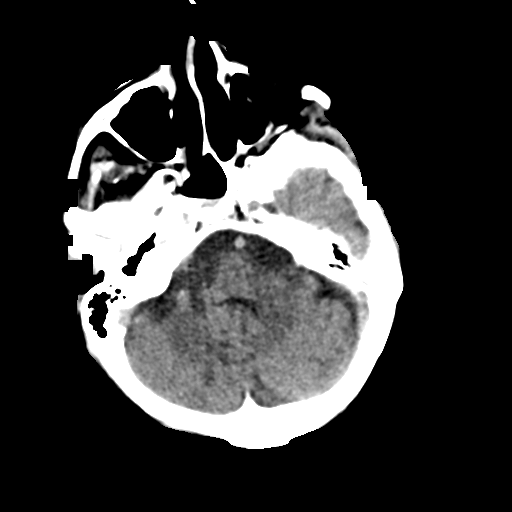
[im 12/31  brain]
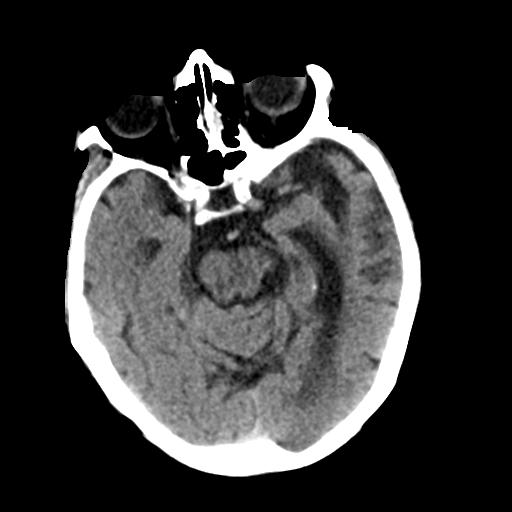
[im 16/31  brain]
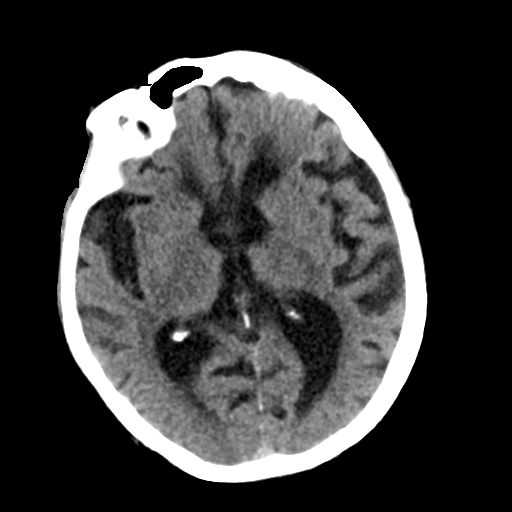
[im 19/31  brain]
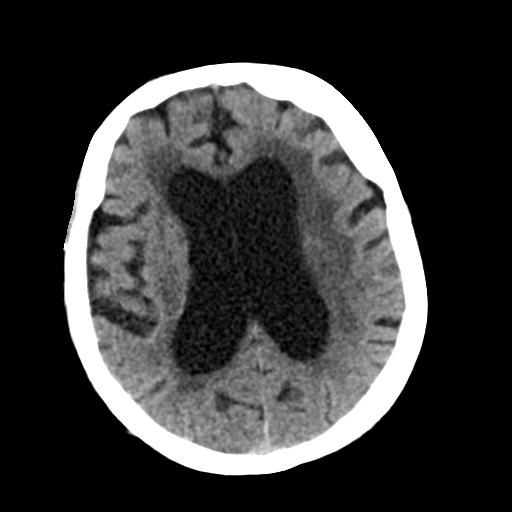
[im 19/31  bone]
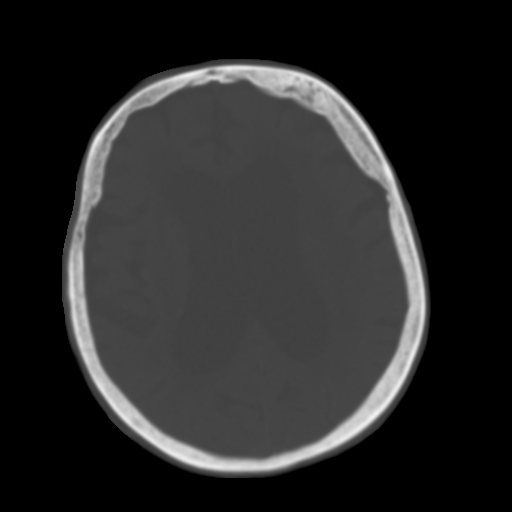
[im 23/31  brain]
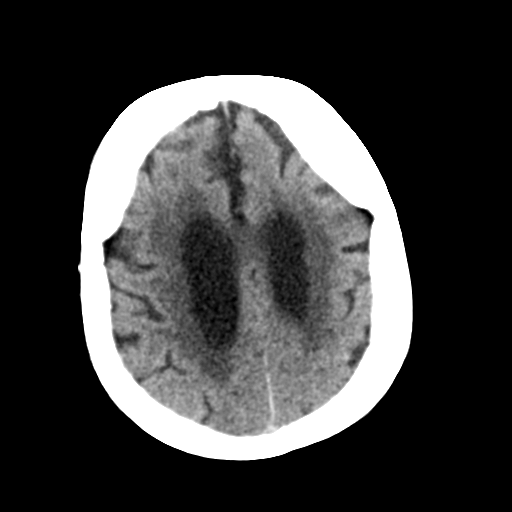
[im 27/31  brain]
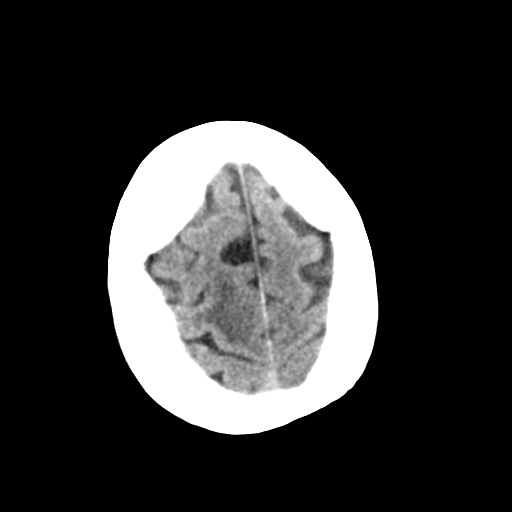

[Series 4: head bone · axial · 0.39mm/px · z∈[-90,-74]mm · 2 of 76 slices shown]
[im 8/76  bone]
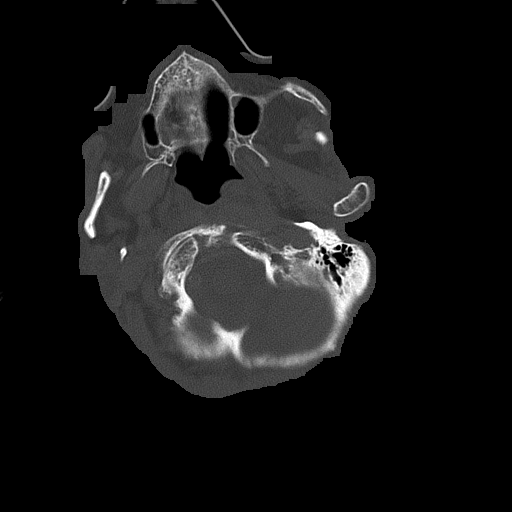
[im 16/76  bone]
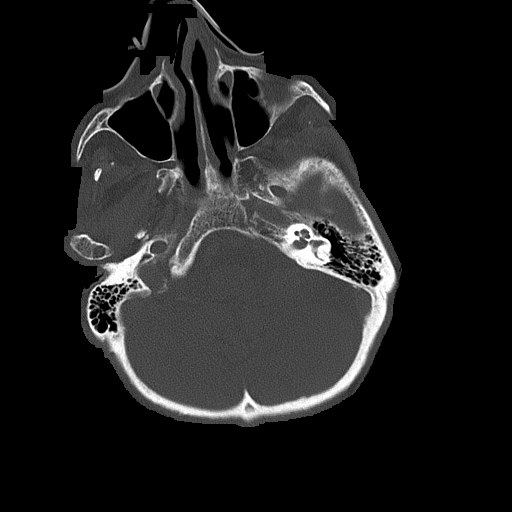

[Series 5: head without cor · coronal · non-contrast · 0.32mm/px · 3 of 67 slices shown]
[im 23/67  brain]
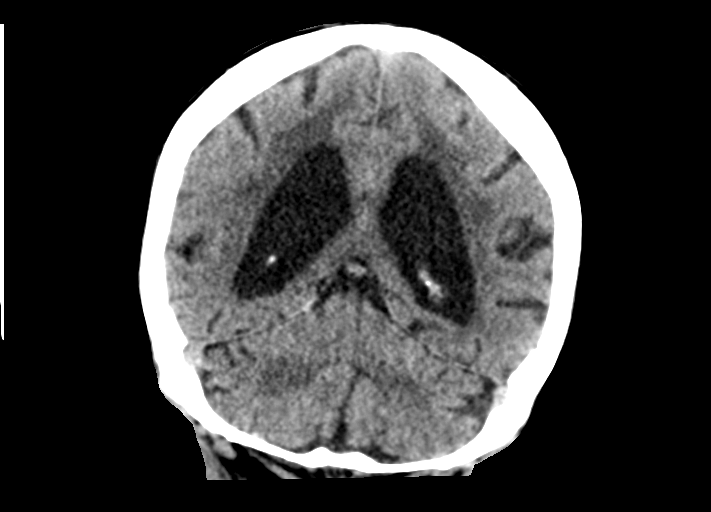
[im 30/67  brain]
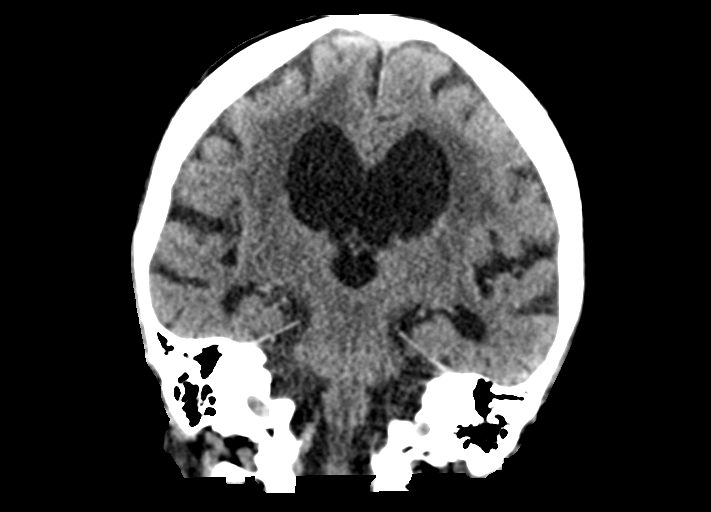
[im 37/67  brain]
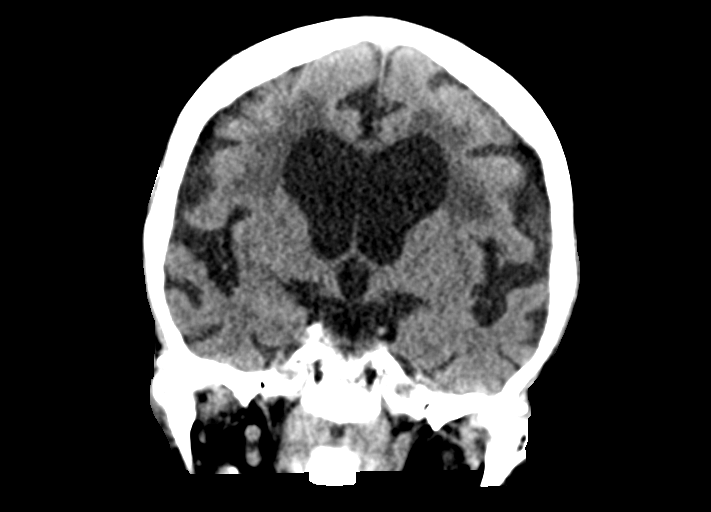

[Series 6: head without sag · sagittal · non-contrast · 0.30mm/px · 3 of 59 slices shown]
[im 20/59  brain]
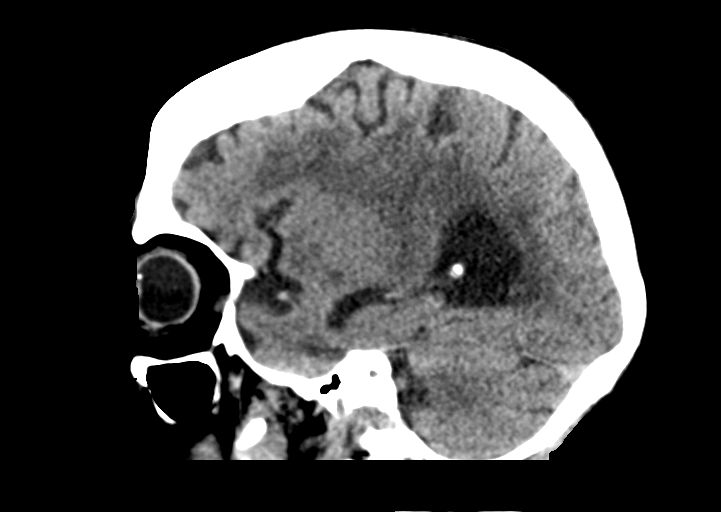
[im 30/59  brain]
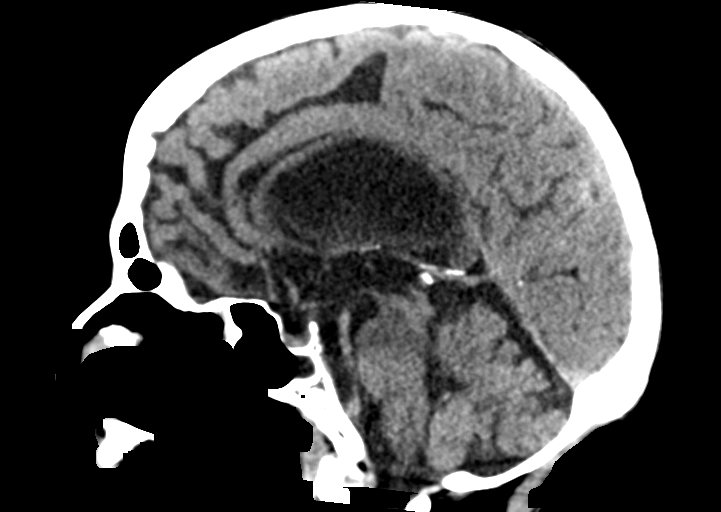
[im 39/59  brain]
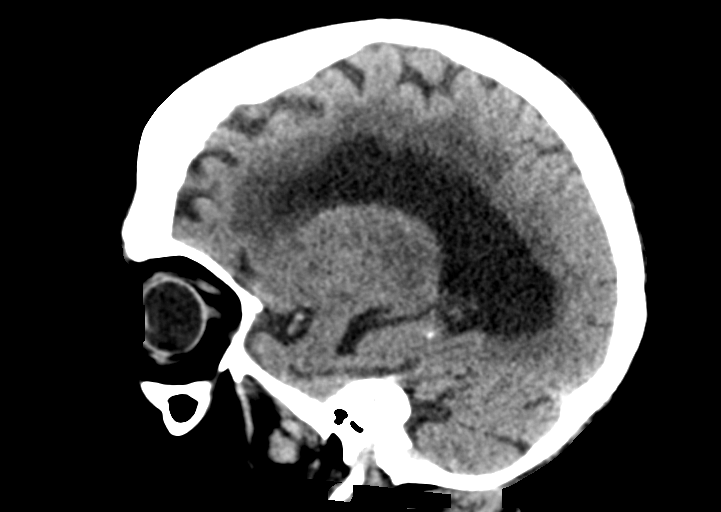

[15 of 47 positions shown; findings below may reference images not displayed]

FINDINGS: Brain: There is no evidence for acute hemorrhage, hydrocephalus,
mass lesion, or abnormal extra-axial fluid collection. No definite
CT evidence for acute infarction. Diffuse loss of parenchymal volume
is consistent with atrophy. Diffuse ventriculomegaly evident. Patchy
low attenuation in the deep hemispheric and periventricular white
matter is nonspecific, but likely reflects chronic microvascular
ischemic demyelination.

Vascular: No hyperdense vessel or unexpected calcification.

Skull: Age indeterminate fracture of the nasal bones evident. No
evidence for skull fracture. Specifically, no evidence for temporal
bone fracture No worrisome lytic or sclerotic lesion.

Sinuses/Orbits: Air-fluid levels are seen in the sphenoid sinuses
bilaterally. No fluid in the mastoid air cells or middle ear on
either side. Mid axillary and right frontal sinus are clear. Left
frontal sinus hypoplastic. Visualized portions of the globes and
intraorbital fat are unremarkable.

Other: None.
IMPRESSION: 1. Diffuse atrophy with associated diffuse ventriculomegaly.
Ventriculomegaly likely related to central atrophy although
hydrocephalus cannot be excluded.
2. Diffuse chronic small vessel white matter ischemic disease.
3. Air-fluid levels in the sphenoid sinuses may be related to
hemorrhage at the nasal bone fractures are acute. In the absence of
acute trauma, acute sinusitis could have this appearance.
4. No evidence for temporal bone fracture or subarachnoid
hemorrhage.
